# Patient Record
Sex: Female | Born: 1950 | Race: White | Hispanic: No | Smoking: Former smoker
Health system: Southern US, Community
[De-identification: ages and names within clinical notes are randomized; demographics above are authoritative.]

## PROBLEM LIST (undated history)

## (undated) DIAGNOSIS — D649 Anemia, unspecified: Secondary | ICD-10-CM

## (undated) DIAGNOSIS — L309 Dermatitis, unspecified: Secondary | ICD-10-CM

## (undated) DIAGNOSIS — G4730 Sleep apnea, unspecified: Secondary | ICD-10-CM

## (undated) DIAGNOSIS — I219 Acute myocardial infarction, unspecified: Secondary | ICD-10-CM

## (undated) DIAGNOSIS — Z8719 Personal history of other diseases of the digestive system: Secondary | ICD-10-CM

## (undated) DIAGNOSIS — E1142 Type 2 diabetes mellitus with diabetic polyneuropathy: Secondary | ICD-10-CM

## (undated) DIAGNOSIS — I739 Peripheral vascular disease, unspecified: Secondary | ICD-10-CM

## (undated) DIAGNOSIS — R51 Headache: Secondary | ICD-10-CM

## (undated) DIAGNOSIS — K429 Umbilical hernia without obstruction or gangrene: Secondary | ICD-10-CM

## (undated) DIAGNOSIS — N183 Chronic kidney disease, stage 3 unspecified: Secondary | ICD-10-CM

## (undated) DIAGNOSIS — E785 Hyperlipidemia, unspecified: Secondary | ICD-10-CM

## (undated) DIAGNOSIS — M1990 Unspecified osteoarthritis, unspecified site: Secondary | ICD-10-CM

## (undated) DIAGNOSIS — K219 Gastro-esophageal reflux disease without esophagitis: Secondary | ICD-10-CM

## (undated) DIAGNOSIS — E119 Type 2 diabetes mellitus without complications: Secondary | ICD-10-CM

## (undated) DIAGNOSIS — F419 Anxiety disorder, unspecified: Secondary | ICD-10-CM

## (undated) DIAGNOSIS — Z9889 Other specified postprocedural states: Secondary | ICD-10-CM

## (undated) DIAGNOSIS — R112 Nausea with vomiting, unspecified: Secondary | ICD-10-CM

## (undated) DIAGNOSIS — I779 Disorder of arteries and arterioles, unspecified: Secondary | ICD-10-CM

## (undated) DIAGNOSIS — I2510 Atherosclerotic heart disease of native coronary artery without angina pectoris: Secondary | ICD-10-CM

## (undated) DIAGNOSIS — I5042 Chronic combined systolic (congestive) and diastolic (congestive) heart failure: Secondary | ICD-10-CM

## (undated) DIAGNOSIS — I10 Essential (primary) hypertension: Secondary | ICD-10-CM

## (undated) HISTORY — DX: Hyperlipidemia, unspecified: E78.5

## (undated) HISTORY — PX: CARDIAC CATHETERIZATION: SHX172

## (undated) HISTORY — DX: Essential (primary) hypertension: I10

## (undated) HISTORY — DX: Chronic kidney disease, stage 3 unspecified: N18.30

## (undated) HISTORY — DX: Atherosclerotic heart disease of native coronary artery without angina pectoris: I25.10

## (undated) HISTORY — DX: Dermatitis, unspecified: L30.9

## (undated) HISTORY — PX: CARPAL TUNNEL RELEASE: SHX101

## (undated) HISTORY — DX: Chronic kidney disease, stage 3 (moderate): N18.3

## (undated) HISTORY — DX: Gastro-esophageal reflux disease without esophagitis: K21.9

## (undated) HISTORY — DX: Anemia, unspecified: D64.9

## (undated) HISTORY — DX: Type 2 diabetes mellitus with diabetic polyneuropathy: E11.42

## (undated) HISTORY — DX: Disorder of arteries and arterioles, unspecified: I77.9

## (undated) HISTORY — PX: EYE SURGERY: SHX253

## (undated) HISTORY — PX: ABDOMINAL HYSTERECTOMY: SHX81

## (undated) HISTORY — DX: Type 2 diabetes mellitus without complications: E11.9

## (undated) HISTORY — PX: OTHER SURGICAL HISTORY: SHX169

## (undated) HISTORY — DX: Umbilical hernia without obstruction or gangrene: K42.9

## (undated) HISTORY — DX: Chronic combined systolic (congestive) and diastolic (congestive) heart failure: I50.42

## (undated) HISTORY — DX: Peripheral vascular disease, unspecified: I73.9

---

## 2003-08-01 HISTORY — PX: FRACTURE SURGERY: SHX138

## 2006-09-07 ENCOUNTER — Ambulatory Visit: Payer: Self-pay | Admitting: Vascular Surgery

## 2008-09-25 DIAGNOSIS — K429 Umbilical hernia without obstruction or gangrene: Secondary | ICD-10-CM | POA: Insufficient documentation

## 2013-03-20 ENCOUNTER — Encounter: Payer: Self-pay | Admitting: *Deleted

## 2013-03-20 DIAGNOSIS — K219 Gastro-esophageal reflux disease without esophagitis: Secondary | ICD-10-CM | POA: Insufficient documentation

## 2013-03-20 DIAGNOSIS — I999 Unspecified disorder of circulatory system: Secondary | ICD-10-CM | POA: Insufficient documentation

## 2013-03-20 DIAGNOSIS — F419 Anxiety disorder, unspecified: Secondary | ICD-10-CM | POA: Insufficient documentation

## 2013-03-20 DIAGNOSIS — I10 Essential (primary) hypertension: Secondary | ICD-10-CM | POA: Insufficient documentation

## 2013-03-20 DIAGNOSIS — E109 Type 1 diabetes mellitus without complications: Secondary | ICD-10-CM | POA: Insufficient documentation

## 2013-03-20 DIAGNOSIS — I200 Unstable angina: Secondary | ICD-10-CM

## 2013-03-20 DIAGNOSIS — E11319 Type 2 diabetes mellitus with unspecified diabetic retinopathy without macular edema: Secondary | ICD-10-CM | POA: Insufficient documentation

## 2013-03-20 DIAGNOSIS — G4730 Sleep apnea, unspecified: Secondary | ICD-10-CM | POA: Insufficient documentation

## 2013-03-21 ENCOUNTER — Encounter: Payer: Self-pay | Admitting: Cardiovascular Disease

## 2013-03-21 ENCOUNTER — Ambulatory Visit (INDEPENDENT_AMBULATORY_CARE_PROVIDER_SITE_OTHER): Payer: 59 | Admitting: Cardiovascular Disease

## 2013-03-21 VITALS — BP 173/65 | HR 71 | Ht 63.0 in | Wt 193.0 lb

## 2013-03-21 DIAGNOSIS — I2584 Coronary atherosclerosis due to calcified coronary lesion: Secondary | ICD-10-CM | POA: Insufficient documentation

## 2013-03-21 DIAGNOSIS — I10 Essential (primary) hypertension: Secondary | ICD-10-CM

## 2013-03-21 DIAGNOSIS — E785 Hyperlipidemia, unspecified: Secondary | ICD-10-CM | POA: Insufficient documentation

## 2013-03-21 DIAGNOSIS — I2510 Atherosclerotic heart disease of native coronary artery without angina pectoris: Secondary | ICD-10-CM

## 2013-03-21 DIAGNOSIS — I739 Peripheral vascular disease, unspecified: Secondary | ICD-10-CM | POA: Insufficient documentation

## 2013-03-21 NOTE — Patient Instructions (Addendum)
Your physician recommends that you schedule a follow-up appointment in: POST BYPASS SURGERY WITH Dr. Purvis Sheffield   You have been referred to Dr Tyrone Sage, we will call you with the appointment date and time once available

## 2013-03-21 NOTE — Progress Notes (Signed)
Patient ID: Kiara [name], female   DOB: 26-Mar-1951, 62 y.o.   MRN: [id number]    CARDIOLOGY CONSULT NOTE  Patient ID: Kiara [name] MRN: [id number] DOB/AGE: 19-Apr-1951 62 y.o.    HPI:  Kiara [name] is a 62 y.o. Woman who is self-referred today for a second opinion regarding CABG. She presented to another cardiologist with exertional dyspnea and angina. She had been seeing a cardiologist in [place] for 15 years prior to this.  She recently underwent cardiac catheterization at [facility], and while I don't have the actual report available to me at present, the documentation from her visit with Dr. Phylliss Bob (Cardiothoracic surgeon with [facility]) stated the following: "Extensive 3 vessel disease with critical disease affecting her LAD, circumflex and a totally occluded right coronary artery. She has typical diffuse diabetic coronary disease with small distal target vessels that have diffuse atheromatous involvement; however, she does appear to have graftable targets in the right circumflex and LAD distributions". 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 A recent HbA1C was reportedly 9.6%. She was told that she had to get this under better control prior to CABG.  She is on ASA, Plavix, Metoprolol, Imdur, Benazepril, Lasix and Simvastatin. A recent echocardiogram showed an EF of 40-45%, with several wall motion abnormalities (noted below). A prior stress test had revealed "moderate ischemia involving the anterior and anteroseptal walls, with both apical and anterolateral scar.  At present, she gets mild dyspnea on exertion. It may even occur with speaking on the telephone for long periods of time. She's had a couple of episodes of chest pain, and it usually occurs when she's lying down. She's had to sleep with 2 pillows. She denies lightheadedness,  syncope, and palpitations. She has experienced fatigue and leg weakness in the past several weeks as well.  She prefers to have her surgery done at [facility]. She does agree that she needs the surgery.    Review of systems complete and found to be negative unless listed above in HPI  Past Medical History: see HPI  FamHx: her brother died at age 54 of an MI in 10-06-2012.   Family History  Problem Relation Age of Onset  . Heart attack Mother   . Heart disease Mother   . Hypertension Mother   . Heart attack Brother   . Heart disease Brother   . Hypertension Brother     History   Social History  . Marital Status: Married    Spouse Name: N/A    Number of Children: N/A  . Years of Education: N/A   Occupational History  . Not on file.   Social History Main Topics  . Smoking status: Former Games developer  . Smokeless tobacco: Not on file     Comment: STOPPED 2007  . Alcohol Use: No  . Drug Use: Not on file  . Sexual Activity: Not on file   Other Topics Concern  . Not on file   Social History Narrative  . No narrative on file      (Not in a hospital admission)  Physical exam Blood pressure 173/65, pulse 71, height 5\' 3"  (1.6 m), weight 193 lb (87.544 kg). General: NAD Neck: No JVD, no thyromegaly or thyroid nodule.  Lungs: Clear to auscultation bilaterally with normal respiratory effort. CV: Nondisplaced PMI.  Heart regular S1/S2, no S3/S4, no murmur.  No peripheral  edema.  No carotid bruit. Diminished pedal pulses.  Abdomen: Soft, nontender, no hepatosplenomegaly, no distention.  Skin: Intact without lesions or rashes.  Neurologic: Alert and oriented x 3.  Psych: Normal affect. Extremities: No clubbing or cyanosis.  HEENT: Normal.   Labs: reviewed from 01-30-2013, and CBC and BMP within normal limits.      Echo: Summary  1. Overall left ventricular ejection fraction is estimated at 40 to 45%.  2. Mildly to moderately decreased global left ventricular systolic  function.  3. Left ventricular wall motion abnormalities exist. See wall motion findings.  4. Mild mitral annular calcification.  5. Elevated left ventricular end diastolic pressure by tissue Doppler.  6. No intracardiac thrombi, mass or vegetations.     ASSESSMENT AND PLAN:  1. Severe CAD with reduced LV systolic function: given all the available data, with known severe 3-vessel disease, reduced LV systolic function, and diabetes, I am in agreement that CABG is the optimal revascularization option. The patient is in agreement with this as well. I will have her referred to one of our CT surgeons in [facility] for an additional evaluation as well as planning. I do not have the actual cardiac catheterization report or films available to me at the present time, but will work on obtaining these. She is on good medical therapy at present, which includes ASA, Metoprolol, Plavix, Benazepril, Imdur, and Simvastatin. 2. HTN: elevated today, but the patient was anxious and she checks it at home and it is usually well controlled. 3. Hyperlipidemia: will obtain results of her most recent lipid panel. 4. PVD: I will check ABI's when I see her in f/u after CABG.  Signed: Prentice Docker, M.D., F.A.C.C. 03/21/2013, 2:28 PM

## 2013-04-02 ENCOUNTER — Institutional Professional Consult (permissible substitution) (INDEPENDENT_AMBULATORY_CARE_PROVIDER_SITE_OTHER): Payer: 59 | Admitting: Surgery

## 2013-04-02 ENCOUNTER — Other Ambulatory Visit: Payer: Self-pay | Admitting: *Deleted

## 2013-04-02 ENCOUNTER — Encounter: Payer: Self-pay | Admitting: Surgery

## 2013-04-02 VITALS — BP 121/58 | HR 74 | Resp 20 | Ht 63.0 in | Wt 193.0 lb

## 2013-04-02 DIAGNOSIS — I2510 Atherosclerotic heart disease of native coronary artery without angina pectoris: Secondary | ICD-10-CM

## 2013-04-02 NOTE — Progress Notes (Signed)
[address]       [place] [address]             [phone]         PCP is Shellia Carwin, MD Referring Provider is Laqueta Linden, MD  Chief Complaint  Patient presents with  . Coronary Artery Disease    surgical eval for possible CABG, cardiac cath 02/03/13, TEE 01/30/13 @ [facility]    HPI:  Patient is a 62 year old woman with a history of type 1 diabetes for 50 years, hypertension, hyperlipidemia, a family history of heart disease and remote smoking who began having burning SSCP in January 2014 occuring with any exertion. She initially thought this was heart burn but it did not improve. She developed some dyspnea with it and there was some thought that it might be asthma. She was started on bronchodilators without improvement. She says that after her Paxil was increased the chest pain resolved and did not recur. This summer she developed more shortness of breath and fatigue. She had a Lexiscan stress test done which showed apical scar and moderate ischemia involving the anterior and anteroseptal walls with some anterolateral scar in the diagonal distribution. EF was 53% with apical akinesia. 2D echo on 01/30/2013 showed an EF of 40-45% with apical akinesis. The basal and mid anterolateral wall and basal and mid inferolateral wall were moderately hypokinetic. The basal and mid anterior wall and basal and mid inferior septum were mildly hypokinetic. There was no mitral regurgitation. There was no aortic stenosis or aortic insufficiency. She subsequently underwent cardiac catheterization at [facility] on 02/03/2013. This showed severe multivessel coronary disease. The LAD had a hazy 70% plus stenosis in the midportion just after the takeoff of a diagonal branch. The LAD wrapped around the apex. The left circumflex had diffuse irregularity but no significant stenosis. There is a moderate-sized first marginal branch that  had 99% ostial stenosis. There is a distal left posterior lateral branch but no significant disease in it. The right coronary was occluded proximally with filling of the distal vessel by collaterals from the septum. Left ventricular ejection fraction was 40% with anterior apical akinesis and anterior and inferior hypokinesis. There was no gradient across the aortic valve. The patient said that she continues to have exertional fatigue and has not been doing very much activity. She has not had any exertional chest discomfort since last spring.  Past Medical History  Diagnosis Date  . Diabetes mellitus without complication   . Hypertension   . Coronary artery disease   . Diabetic peripheral neuropathy   . Hyperlipidemia   . GERD (gastroesophageal reflux disease)   . Umbilical hernia   . Eczema     Past Surgical History  Procedure Laterality Date  . Amputation of right 4th and 5th toe    . Abdominal hysterectomy    . Carpal tunnel release      Family History  Problem Relation Age of Onset  . Heart attack Mother   . Heart disease Mother   . Hypertension Mother   . Heart attack Brother   . Heart disease Brother   . Hypertension Brother     Social History History  Substance Use Topics  . Smoking status: Former Smoker -- 25 years    Types: Cigarettes    Quit date: 05/30/2010  . Smokeless tobacco: Never Used     Comment: STOPPED 2007  . Alcohol Use: No  Current Outpatient Prescriptions  Medication Sig Dispense Refill  . aspirin EC 81 MG tablet Take 81 mg by mouth daily.      . beclomethasone (QVAR) 40 MCG/ACT inhaler Inhale 2 puffs into the lungs as needed.      . benazepril (LOTENSIN) 20 MG tablet Take 20 mg by mouth daily.      . clonazePAM (KLONOPIN) 1 MG tablet Take 0.5 mg by mouth at bedtime.      . clopidogrel (PLAVIX) 75 MG tablet Take 75 mg by mouth daily.      . fish oil-omega-3 fatty acids 1000 MG capsule Take 1 g by mouth daily.      . fluticasone (FLONASE) 50  MCG/ACT nasal spray Place 2 sprays into the nose daily.      . furosemide (LASIX) 20 MG tablet Take 20 mg by mouth 2 (two) times daily.      Marland Kitchen gabapentin (NEURONTIN) 300 MG capsule Take 300 mg by mouth 3 (three) times daily.      . insulin aspart (NOVOLOG) 100 UNIT/ML injection Inject into the skin 3 (three) times daily with meals.      . isosorbide mononitrate (IMDUR) 30 MG 24 hr tablet Take 30 mg by mouth daily.      Marland Kitchen levocetirizine (XYZAL) 5 MG tablet Take 5 mg by mouth every evening.      . metoprolol tartrate (LOPRESSOR) 25 MG tablet Take 25 mg by mouth 2 (two) times daily.      . nitroGLYCERIN (NITROSTAT) 0.4 MG SL tablet Place 0.4 mg under the tongue every 5 (five) minutes as needed for chest pain.      Marland Kitchen PARoxetine (PAXIL) 20 MG tablet Take 20 mg by mouth every morning.      . polyethylene glycol (MIRALAX / GLYCOLAX) packet Take 17 g by mouth daily.      . ranitidine (ZANTAC) 300 MG capsule Take 300 mg by mouth every evening.      . simvastatin (ZOCOR) 40 MG tablet Take 40 mg by mouth every evening.      Marland Kitchen albuterol (PROVENTIL HFA;VENTOLIN HFA) 108 (90 BASE) MCG/ACT inhaler Inhale 2 puffs into the lungs every 6 (six) hours as needed for wheezing.       No current facility-administered medications for this visit.    Allergies  Allergen Reactions  . Bactrim [Sulfamethoxazole W-Trimethoprim] Rash  . Codeine Nausea And Vomiting  . Sulfamethoxazole-Tmp Ds Rash    Review of Systems  Constitutional: Positive for activity change and fatigue.  HENT: Negative.   Eyes: Negative.   Respiratory: Positive for shortness of breath.   Cardiovascular: Negative for chest pain, palpitations and leg swelling.  Gastrointestinal: Negative.   Genitourinary: Negative.   Musculoskeletal: Negative.   Skin: Negative.   Allergic/Immunologic: Negative.   Neurological: Negative.   Hematological: Negative.   Psychiatric/Behavioral: Negative.     BP 121/58  Pulse 74  Resp 20  Ht 5\' 3"  (1.6 m)  Wt  193 lb (87.544 kg)  BMI 34.2 kg/m2  SpO2 98% Physical Exam  Constitutional: She is oriented to person, place, and time. She appears well-developed and well-nourished. No distress.  HENT:  Head: Normocephalic and atraumatic.  Mouth/Throat: Oropharynx is clear and moist.  Eyes: Conjunctivae and EOM are normal. Pupils are equal, round, and reactive to light.  Neck: Normal range of motion. Neck supple. No JVD present. No thyromegaly present.  Cardiovascular: Normal rate, regular rhythm and normal heart sounds.   No murmur heard. Pulmonary/Chest: Effort normal  and breath sounds normal. No respiratory distress. She has no wheezes. She has no rales.  Abdominal: Soft. Bowel sounds are normal. She exhibits no distension and no mass. There is no tenderness.  Musculoskeletal: She exhibits no edema.  Amputation of right 4th and 5th toes  Lymphadenopathy:    She has no cervical adenopathy.  Neurological: She is alert and oriented to person, place, and time. No cranial nerve deficit.  Skin: Skin is warm and dry.  Psychiatric: She has a normal mood and affect.     Diagnostic Tests:  Cardiac cath films from 02/03/2013 at [facility] were reviewed by me.   Impression/Plan:  She has severe three-vessel coronary disease with diffusely diseased, diabetic- type coronaries. She has a high risk Lexiscan stress test showing anterior and anteroseptal ischemia and I believe the LAD, obtuse marginal, and right coronaries are graftable. Her risk for graft failure is certainly higher due to the diffuse nature of her coronary disease but at the present time she has a large area of myocardium at risk for ischemia and infarction and CABG is really the only option. I discussed the operative procedure with the patient and her son including alternatives, benefits and risks; including but not limited to bleeding, blood transfusion, infection, stroke, myocardial infarction, graft failure, heart block requiring  a permanent pacemaker, organ dysfunction, and death.  [facility] understands and agrees to proceed.  We will schedule surgery for Sept 12, 2014.

## 2013-04-08 ENCOUNTER — Encounter (HOSPITAL_COMMUNITY): Payer: Self-pay | Admitting: Pharmacy Technician

## 2013-04-09 ENCOUNTER — Inpatient Hospital Stay (HOSPITAL_COMMUNITY)
Admission: RE | Admit: 2013-04-09 | Discharge: 2013-04-09 | Disposition: A | Discharge status: Home / Self Care | Payer: Medicare FFS | Source: Ambulatory Visit | Attending: Surgery | Admitting: Surgery

## 2013-04-09 ENCOUNTER — Ambulatory Visit (HOSPITAL_COMMUNITY)
Admission: RE | Admit: 2013-04-09 | Discharge: 2013-04-09 | Disposition: A | Discharge status: Home / Self Care | Payer: Medicare FFS | Source: Ambulatory Visit | Attending: Surgery | Admitting: Surgery

## 2013-04-09 ENCOUNTER — Encounter (HOSPITAL_COMMUNITY): Payer: Self-pay

## 2013-04-09 ENCOUNTER — Encounter (HOSPITAL_COMMUNITY)
Admission: RE | Admit: 2013-04-09 | Discharge: 2013-04-09 | Disposition: A | Discharge status: Home / Self Care | Payer: Medicare FFS | Source: Ambulatory Visit | Attending: Surgery | Admitting: Surgery

## 2013-04-09 VITALS — BP 138/60 | HR 77 | Temp 98.3°F | Resp 20 | Ht 63.0 in | Wt 191.6 lb

## 2013-04-09 DIAGNOSIS — Z01818 Encounter for other preprocedural examination: Secondary | ICD-10-CM | POA: Insufficient documentation

## 2013-04-09 DIAGNOSIS — Z01810 Encounter for preprocedural cardiovascular examination: Secondary | ICD-10-CM

## 2013-04-09 DIAGNOSIS — Z01811 Encounter for preprocedural respiratory examination: Secondary | ICD-10-CM | POA: Insufficient documentation

## 2013-04-09 DIAGNOSIS — I2510 Atherosclerotic heart disease of native coronary artery without angina pectoris: Secondary | ICD-10-CM

## 2013-04-09 HISTORY — DX: Other specified postprocedural states: Z98.890

## 2013-04-09 HISTORY — DX: Headache: R51

## 2013-04-09 HISTORY — DX: Acute myocardial infarction, unspecified: I21.9

## 2013-04-09 HISTORY — DX: Personal history of other diseases of the digestive system: Z87.19

## 2013-04-09 HISTORY — DX: Other specified postprocedural states: R11.2

## 2013-04-09 HISTORY — DX: Unspecified osteoarthritis, unspecified site: M19.90

## 2013-04-09 HISTORY — DX: Anxiety disorder, unspecified: F41.9

## 2013-04-09 HISTORY — DX: Sleep apnea, unspecified: G47.30

## 2013-04-09 LAB — CBC
HCT: 39.7 % (ref 36.0–46.0)
Hemoglobin: 13.7 g/dL (ref 12.0–15.0)
MCH: 31.6 pg (ref 26.0–34.0)
MCHC: 34.5 g/dL (ref 30.0–36.0)
MCV: 91.7 fL (ref 78.0–100.0)
RBC: 4.33 MIL/uL (ref 3.87–5.11)

## 2013-04-09 LAB — URINALYSIS, ROUTINE W REFLEX MICROSCOPIC
Glucose, UA: NEGATIVE mg/dL
Hgb urine dipstick: NEGATIVE
Protein, ur: NEGATIVE mg/dL
pH: 6 (ref 5.0–8.0)

## 2013-04-09 LAB — ABO/RH: ABO/RH(D): A POS

## 2013-04-09 LAB — COMPREHENSIVE METABOLIC PANEL
AST: 23 U/L (ref 0–37)
Albumin: 4 g/dL (ref 3.5–5.2)
Alkaline Phosphatase: 65 U/L (ref 39–117)
BUN: 37 mg/dL — ABNORMAL HIGH (ref 6–23)
CO2: 27 mEq/L (ref 19–32)
Creatinine, Ser: 1.15 mg/dL — ABNORMAL HIGH (ref 0.50–1.10)
Glucose, Bld: 46 mg/dL — ABNORMAL LOW (ref 70–99)
Total Bilirubin: 0.3 mg/dL (ref 0.3–1.2)

## 2013-04-09 LAB — SURGICAL PCR SCREEN: MRSA, PCR: NEGATIVE

## 2013-04-09 LAB — BLOOD GAS, ARTERIAL
Acid-Base Excess: 2.9 mmol/L — ABNORMAL HIGH (ref 0.0–2.0)
Bicarbonate: 27.6 mEq/L — ABNORMAL HIGH (ref 20.0–24.0)
O2 Saturation: 97.9 %
Patient temperature: 98.6
pO2, Arterial: 93.4 mmHg (ref 80.0–100.0)

## 2013-04-09 LAB — URINE MICROSCOPIC-ADD ON

## 2013-04-09 LAB — PULMONARY FUNCTION TEST

## 2013-04-09 MED ORDER — CHLORHEXIDINE GLUCONATE 4 % EX LIQD: 30.0000 mL | CUTANEOUS | Status: DC

## 2013-04-09 MED ORDER — ALBUTEROL SULFATE (5 MG/ML) 0.5% IN NEBU
2.5000 mg | INHALATION_SOLUTION | Freq: Once | RESPIRATORY_TRACT | Status: AC
  Administered 2013-04-09: 2.5 mg via RESPIRATORY_TRACT

## 2013-04-09 NOTE — Progress Notes (Signed)
Instructed patient to keep Insulin pump at basal rate when NPO for surgery per Diabetes Coordinator Edrick Oh RN, and that an Insulin drip would be started morning of surgery. Patient's husband will be with her to get her insulin pump. Requested sleep study from [facility].

## 2013-04-09 NOTE — Progress Notes (Addendum)
VASCULAR LAB PRELIMINARY  PRELIMINARY  PRELIMINARY  PRELIMINARY  Pre-op Cardiac Surgery  Carotid Findings:  Right 1% to 39% ICA stenosis. Left - 40% to 59% ICA stenosis lowest end of range. Bilateral - Vertebral artery flow is antegrade.  Upper Extremity Right Left  Brachial Pressures 173 Triphasic 153 Triphasic  Radial Waveforms Triphasic Triphasic  Ulnar Waveforms Triohasic Triphasic  Palmar Arch (Allen's Test) Normal Normal   Findings:  There is a 20 mm Hg pressure gradient etiology unknown. Doppler waveforms remained normal bilaterally with both radial and ulnar compressions    Lower  Extremity Right Left  Anterior Tibial 194 Biphasic 113 Monophasic  Posterior Tibial 142 Monophasic 153 Biphasic  Ankle/Brachial Indices 1.12 0.94    Findings:  ABIs indicate normal arterial flow bilaterally at rest however abnormal Doppler waveforms may suggest a false elevation of pressures and calcification. Patient has known PVD and DM with right fifth toe amputation in the past.   Sylvio Weatherall, RVS 04/09/2013, 3:04 PM

## 2013-04-09 NOTE — Pre-Procedure Instructions (Signed)
[facility]  04/09/2013   Your procedure is scheduled on:  Friday April 11, 2013  Report to [facility] at 0530 AM.  Call this number if you have problems the morning of surgery: [phone]   Remember:   Do not eat food or drink liquids after midnight Thursday.   Take these medicines the morning of surgery with A SIP OF WATER: Gabapentin, and Metoprolol,    Do not wear jewelry, make-up or nail polish.  Do not wear lotions, powders, or perfumes. You may wear deodorant.  Do not shave 48 hours prior to surgery.  Do not bring valuables to the hospital.  [facility] is not responsible for any belongings or valuables.  Contacts, dentures or bridgework may not be worn into surgery.  Leave suitcase in the car. After surgery it may be brought to your room.  For patients admitted to the hospital, checkout time is 11:00 AM the day of discharge.   Patients discharged the day of surgery will not be allowed to drive home.    Special Instructions: Incentive Spirometry - Practice and bring it with you on the day of surgery. Shower using CHG 2 nights before surgery and the night before surgery.  If you shower the day of surgery use CHG.  Use special wash - you have one bottle of CHG for all showers.  You should use approximately 1/3 of the bottle for each shower.   Please read over the following fact sheets that you were given: Pain Booklet, Coughing and Deep Breathing, Blood Transfusion Information, MRSA Information and Surgical Site Infection Prevention

## 2013-04-09 NOTE — Progress Notes (Signed)
Pt chart left for [place], [place] (anesthesia)  to review abnormal labs ( Glucose 46).

## 2013-04-09 NOTE — Progress Notes (Signed)
Inpatient Diabetes Program Recommendations  AACE/ADA: New Consensus Statement on Inpatient Glycemic Control (2013)  Target Ranges:  Prepandial:   less than 140 mg/dL      Peak postprandial:   less than 180 mg/dL (1-2 hours)      Critically ill patients:  140 - 180 mg/dL     Called by Talbert Forest, RN in pre-admissions unit.  Patient has history of Type 1 diabetes and uses an insulin pump at home to control her CBGs.  Patient is scheduled for CABG on Friday, 09/12.  RN calling the Inpatient DM Program to alert [place] to her surgery and admission.  Patient will be NPO after midnight for 09/12. RN told me they attempted to contact patient's PCP in [place] to ask for advice on what to do with the patient's basal rates pre-operatively.  Patient's PCP is out of town this week.  Advised RN to have patient continue her insulin pump at her normal basal settings before she comes to pre-op on Friday.  If patient's basal settings are appropriate, patient should not have hypoglycemia while she is NPO.  Advised RN to please encourage patient to be vigilant for hypoglycemia.  Also advised RN to tell patient that she will be managed with an IV insulin drip for surgery by the TCTS MD.  Reminded RN that the TCTS surgeon will likely have the patient remove her insulin pump in pre-op and that they will start an IV insulin drip for surgery.  Asked RN to please relay this information to patient and to tell patient that she will be managed with an IV insulin drip for surgery and will need to remove her insulin pump once the IV insulin drip is started.  Advised RN to tell patient to have a family member hold onto her insulin pump and to bring the insulin pump back the hospital on POD#1 b/c the physicians will likely want patient to resume her insulin pump postoperatively.   Will follow closely. Ambrose Finland RN, MSN, CDE Diabetes Coordinator Inpatient Diabetes Program Team Pager: [phone] (8a-10p)

## 2013-04-10 ENCOUNTER — Encounter (HOSPITAL_COMMUNITY): Payer: Self-pay

## 2013-04-10 MED ORDER — POTASSIUM CHLORIDE 2 MEQ/ML IV SOLN
80.0000 meq | INTRAVENOUS | Status: DC
  Filled 2013-04-10: qty 40

## 2013-04-10 MED ORDER — EPINEPHRINE HCL 1 MG/ML IJ SOLN
0.5000 ug/min | INTRAVENOUS | Status: DC
  Filled 2013-04-10: qty 4

## 2013-04-10 MED ORDER — SODIUM CHLORIDE 0.9 % IV SOLN
INTRAVENOUS | Status: AC
  Administered 2013-04-11: 1 [IU]/h via INTRAVENOUS
  Administered 2013-04-11: 8.1 [IU]/h via INTRAVENOUS
  Filled 2013-04-10: qty 1

## 2013-04-10 MED ORDER — DOPAMINE-DEXTROSE 3.2-5 MG/ML-% IV SOLN
2.0000 ug/kg/min | INTRAVENOUS | Status: DC
  Filled 2013-04-10: qty 250

## 2013-04-10 MED ORDER — MAGNESIUM SULFATE 50 % IJ SOLN
40.0000 meq | INTRAMUSCULAR | Status: DC
  Filled 2013-04-10: qty 10

## 2013-04-10 MED ORDER — DEXMEDETOMIDINE HCL IN NACL 400 MCG/100ML IV SOLN
0.1000 ug/kg/h | INTRAVENOUS | Status: AC
  Administered 2013-04-11: 0.2 ug/kg/h via INTRAVENOUS
  Filled 2013-04-10: qty 100

## 2013-04-10 MED ORDER — DEXTROSE 5 % IV SOLN
750.0000 mg | INTRAVENOUS | Status: DC
  Filled 2013-04-10: qty 750

## 2013-04-10 MED ORDER — PLASMA-LYTE 148 IV SOLN
INTRAVENOUS | Status: AC
  Administered 2013-04-11: 08:00:00
  Filled 2013-04-10: qty 2.5

## 2013-04-10 MED ORDER — SODIUM CHLORIDE 0.9 % IV SOLN
INTRAVENOUS | Status: AC
  Administered 2013-04-11: 70 mL via INTRAVENOUS
  Filled 2013-04-10: qty 40

## 2013-04-10 MED ORDER — DEXTROSE 5 % IV SOLN
1.5000 g | INTRAVENOUS | Status: AC
  Administered 2013-04-11: .75 g via INTRAVENOUS
  Filled 2013-04-10 (×2): qty 1.5

## 2013-04-10 MED ORDER — VANCOMYCIN HCL 10 G IV SOLR
1250.0000 mg | INTRAVENOUS | Status: AC
  Administered 2013-04-11: 1250 mg via INTRAVENOUS
  Filled 2013-04-10 (×2): qty 1250

## 2013-04-10 MED ORDER — NITROGLYCERIN IN D5W 200-5 MCG/ML-% IV SOLN
2.0000 ug/min | INTRAVENOUS | Status: AC
  Administered 2013-04-11: 5 ug/min via INTRAVENOUS
  Administered 2013-04-11: 50 ug/min via INTRAVENOUS
  Filled 2013-04-10: qty 250

## 2013-04-10 MED ORDER — SODIUM CHLORIDE 0.9 % IV SOLN
INTRAVENOUS | Status: DC
  Filled 2013-04-10: qty 30

## 2013-04-10 MED ORDER — PHENYLEPHRINE HCL 10 MG/ML IJ SOLN
30.0000 ug/min | INTRAVENOUS | Status: DC
  Filled 2013-04-10: qty 2

## 2013-04-10 NOTE — Progress Notes (Signed)
Anesthesia Chart Review:  Patient is a 62 year old female scheduled for CABG on 04/11/13 by Dr. Laneta Simmers.  History includes CAD, DM1 managed with an insulin pump, HTN, GERD, PVD s/p toe amputations, anxiety, former smoker, post-operative N/V, hiatal hernia. Question of OSA (per Dr. Junius Argyle note)--no sleep study was received from [facility]. PCP is Dr. Majel Homer.  She has been followed by a cardiologist in [place] (Dr. Weyman Pedro), but also saw Dr. Purvis Sheffield with [facility] for a second opinion about CABG. DM Coordinator instructed provided instructions for patient to keep insulin pump on basal rate while NPO.  It will be discontinued once an insulin drip is initiated by her anesthesiologist.    EKG on 04/09/13 showed SR with fusion complexes, possible LAE, low voltage QRS, cannot rule out anterior infarct (age undetermined).  Cardiac cath at [facility] on 02/03/13 showed 3V CAD, EF 40%.  Echo on 01/30/13 showed:  1. Overall left ventricular ejection fraction is estimated at 40 to 45%. 2. Mildly to moderately decreased global left ventricular systolic function. 3. Left ventricular wall motion abnormalities exist. See wall motion findings. 4. Mild mitral annular calcification. 5. Elevated left ventricular end diastolic pressure by tissue Doppler. 6. No intracardiac thrombi, mass or vegetations.   Carotid duplex on 04/09/13 showed: - Right - 1% to 39% ICA stenosis. Left 40% to 59% ICA stenosis lowest end of range. Vertebral artery flow is antegrade bilaterally  CXR on 04/09/13 showed no active cardiopulmonary disease.  Preoperative labs noted.  Glucose was 46 yesterday with mean plasma glucose of 203.  A1C was 8.7.  Cr 1.15.  CBC, PT/PTT WNL.  She will get a fasting CBG on arrival the day of surgery.  Fortunately, she is listed as a first case since she is a type 1 diabetic.  Velna Ochs [facility]/Anesthesiology Phone [phone] 04/10/2013 11:02 AM

## 2013-04-10 NOTE — Progress Notes (Signed)
Call to Med. Records at [facility] they referred the request for sleep study to the Sleep lab, where a voice machine took the message. Left a msg. To fax report to [phone] for Heart surgery that is scheduled for tomorrow a.m.

## 2013-04-11 ENCOUNTER — Encounter (HOSPITAL_COMMUNITY)
Admission: RE | Disposition: A | Discharge status: Home Health | Payer: Medicare FFS | Source: Ambulatory Visit | Attending: Surgery

## 2013-04-11 ENCOUNTER — Encounter (HOSPITAL_COMMUNITY): Payer: Self-pay | Admitting: Anesthesiology

## 2013-04-11 ENCOUNTER — Inpatient Hospital Stay (HOSPITAL_COMMUNITY)
Admission: RE | Admit: 2013-04-11 | Discharge: 2013-04-17 | DRG: 236 | Disposition: A | Discharge status: Home Health | Payer: Medicare FFS | Source: Ambulatory Visit | Attending: Surgery | Admitting: Surgery

## 2013-04-11 ENCOUNTER — Inpatient Hospital Stay (HOSPITAL_COMMUNITY): Payer: Medicare FFS | Admitting: Anesthesiology

## 2013-04-11 ENCOUNTER — Inpatient Hospital Stay (HOSPITAL_COMMUNITY): Payer: Medicare FFS

## 2013-04-11 DIAGNOSIS — I2584 Coronary atherosclerosis due to calcified coronary lesion: Secondary | ICD-10-CM | POA: Diagnosis present

## 2013-04-11 DIAGNOSIS — S98139A Complete traumatic amputation of one unspecified lesser toe, initial encounter: Secondary | ICD-10-CM

## 2013-04-11 DIAGNOSIS — Z7982 Long term (current) use of aspirin: Secondary | ICD-10-CM

## 2013-04-11 DIAGNOSIS — Z9641 Presence of insulin pump (external) (internal): Secondary | ICD-10-CM

## 2013-04-11 DIAGNOSIS — E109 Type 1 diabetes mellitus without complications: Secondary | ICD-10-CM | POA: Diagnosis present

## 2013-04-11 DIAGNOSIS — I2582 Chronic total occlusion of coronary artery: Secondary | ICD-10-CM | POA: Diagnosis present

## 2013-04-11 DIAGNOSIS — J90 Pleural effusion, not elsewhere classified: Secondary | ICD-10-CM | POA: Diagnosis not present

## 2013-04-11 DIAGNOSIS — E1049 Type 1 diabetes mellitus with other diabetic neurological complication: Secondary | ICD-10-CM | POA: Diagnosis present

## 2013-04-11 DIAGNOSIS — E1039 Type 1 diabetes mellitus with other diabetic ophthalmic complication: Secondary | ICD-10-CM | POA: Diagnosis present

## 2013-04-11 DIAGNOSIS — E785 Hyperlipidemia, unspecified: Secondary | ICD-10-CM | POA: Diagnosis present

## 2013-04-11 DIAGNOSIS — F411 Generalized anxiety disorder: Secondary | ICD-10-CM | POA: Diagnosis present

## 2013-04-11 DIAGNOSIS — I2510 Atherosclerotic heart disease of native coronary artery without angina pectoris: Secondary | ICD-10-CM

## 2013-04-11 DIAGNOSIS — Z8249 Family history of ischemic heart disease and other diseases of the circulatory system: Secondary | ICD-10-CM

## 2013-04-11 DIAGNOSIS — Z79899 Other long term (current) drug therapy: Secondary | ICD-10-CM

## 2013-04-11 DIAGNOSIS — I498 Other specified cardiac arrhythmias: Secondary | ICD-10-CM | POA: Diagnosis not present

## 2013-04-11 DIAGNOSIS — E11319 Type 2 diabetes mellitus with unspecified diabetic retinopathy without macular edema: Secondary | ICD-10-CM | POA: Diagnosis present

## 2013-04-11 DIAGNOSIS — Z87891 Personal history of nicotine dependence: Secondary | ICD-10-CM

## 2013-04-11 DIAGNOSIS — Z951 Presence of aortocoronary bypass graft: Secondary | ICD-10-CM

## 2013-04-11 DIAGNOSIS — E1142 Type 2 diabetes mellitus with diabetic polyneuropathy: Secondary | ICD-10-CM | POA: Diagnosis present

## 2013-04-11 DIAGNOSIS — I739 Peripheral vascular disease, unspecified: Secondary | ICD-10-CM | POA: Diagnosis present

## 2013-04-11 DIAGNOSIS — Z794 Long term (current) use of insulin: Secondary | ICD-10-CM

## 2013-04-11 DIAGNOSIS — D62 Acute posthemorrhagic anemia: Secondary | ICD-10-CM | POA: Diagnosis not present

## 2013-04-11 DIAGNOSIS — Z23 Encounter for immunization: Secondary | ICD-10-CM

## 2013-04-11 DIAGNOSIS — K219 Gastro-esophageal reflux disease without esophagitis: Secondary | ICD-10-CM | POA: Diagnosis present

## 2013-04-11 DIAGNOSIS — G4730 Sleep apnea, unspecified: Secondary | ICD-10-CM | POA: Diagnosis present

## 2013-04-11 DIAGNOSIS — E8779 Other fluid overload: Secondary | ICD-10-CM | POA: Diagnosis not present

## 2013-04-11 DIAGNOSIS — I10 Essential (primary) hypertension: Secondary | ICD-10-CM | POA: Diagnosis present

## 2013-04-11 HISTORY — PX: CORONARY ARTERY BYPASS GRAFT: SHX141

## 2013-04-11 LAB — POCT I-STAT 3, ART BLOOD GAS (G3+)
Bicarbonate: 24.8 mEq/L — ABNORMAL HIGH (ref 20.0–24.0)
Bicarbonate: 23 mEq/L (ref 20.0–24.0)
O2 Saturation: 100 %
O2 Saturation: 98 %
O2 Saturation: 99 %
TCO2: 26 mmol/L (ref 0–100)
TCO2: 24 mmol/L (ref 0–100)
pCO2 arterial: 37.9 mmHg (ref 35.0–45.0)
pCO2 arterial: 34.2 mmHg — ABNORMAL LOW (ref 35.0–45.0)
pCO2 arterial: 36.7 mmHg (ref 35.0–45.0)
pH, Arterial: 7.424 (ref 7.350–7.450)
pH, Arterial: 7.457 — ABNORMAL HIGH (ref 7.350–7.450)
pH, Arterial: 7.409 (ref 7.350–7.450)
pO2, Arterial: 296 mmHg — ABNORMAL HIGH (ref 80.0–100.0)
pO2, Arterial: 102 mmHg — ABNORMAL HIGH (ref 80.0–100.0)

## 2013-04-11 LAB — GLUCOSE, CAPILLARY
Glucose-Capillary: 273 mg/dL — ABNORMAL HIGH (ref 70–99)
Glucose-Capillary: 79 mg/dL (ref 70–99)
Glucose-Capillary: 104 mg/dL — ABNORMAL HIGH (ref 70–99)
Glucose-Capillary: 65 mg/dL — ABNORMAL LOW (ref 70–99)
Glucose-Capillary: 169 mg/dL — ABNORMAL HIGH (ref 70–99)
Glucose-Capillary: 150 mg/dL — ABNORMAL HIGH (ref 70–99)

## 2013-04-11 LAB — CBC
HCT: 27.5 % — ABNORMAL LOW (ref 36.0–46.0)
Hemoglobin: 10.5 g/dL — ABNORMAL LOW (ref 12.0–15.0)
Hemoglobin: 9.5 g/dL — ABNORMAL LOW (ref 12.0–15.0)
MCH: 31.6 pg (ref 26.0–34.0)
MCHC: 35.2 g/dL (ref 30.0–36.0)
RBC: 3.09 MIL/uL — ABNORMAL LOW (ref 3.87–5.11)
RDW: 13.4 % (ref 11.5–15.5)
WBC: 9.9 10*3/uL (ref 4.0–10.5)

## 2013-04-11 LAB — HEMOGLOBIN AND HEMATOCRIT, BLOOD: HCT: 22.7 % — ABNORMAL LOW (ref 36.0–46.0)

## 2013-04-11 LAB — POCT I-STAT 4, (NA,K, GLUC, HGB,HCT)
Glucose, Bld: 324 mg/dL — ABNORMAL HIGH (ref 70–99)
Glucose, Bld: 231 mg/dL — ABNORMAL HIGH (ref 70–99)
HCT: 29 % — ABNORMAL LOW (ref 36.0–46.0)
HCT: 30 % — ABNORMAL LOW (ref 36.0–46.0)
HCT: 23 % — ABNORMAL LOW (ref 36.0–46.0)
HCT: 31 % — ABNORMAL LOW (ref 36.0–46.0)
Hemoglobin: 9.9 g/dL — ABNORMAL LOW (ref 12.0–15.0)
Hemoglobin: 9.9 g/dL — ABNORMAL LOW (ref 12.0–15.0)
Hemoglobin: 7.8 g/dL — ABNORMAL LOW (ref 12.0–15.0)
Hemoglobin: 10.5 g/dL — ABNORMAL LOW (ref 12.0–15.0)
Potassium: 4.6 mEq/L (ref 3.5–5.1)
Potassium: 4.6 mEq/L (ref 3.5–5.1)
Potassium: 4.2 mEq/L (ref 3.5–5.1)
Sodium: 139 mEq/L (ref 135–145)
Sodium: 136 mEq/L (ref 135–145)

## 2013-04-11 LAB — POCT I-STAT, CHEM 8
BUN: 24 mg/dL — ABNORMAL HIGH (ref 6–23)
Calcium, Ion: 1.14 mmol/L (ref 1.13–1.30)
Chloride: 109 mEq/L (ref 96–112)
Creatinine, Ser: 1 mg/dL (ref 0.50–1.10)
Sodium: 143 mEq/L (ref 135–145)
TCO2: 23 mmol/L (ref 0–100)

## 2013-04-11 LAB — PROTIME-INR: Prothrombin Time: 16.9 seconds — ABNORMAL HIGH (ref 11.6–15.2)

## 2013-04-11 LAB — PLATELET COUNT: Platelets: 81 10*3/uL — ABNORMAL LOW (ref 150–400)

## 2013-04-11 LAB — CREATININE, SERUM
Creatinine, Ser: 0.99 mg/dL (ref 0.50–1.10)
GFR calc Af Amer: 70 mL/min — ABNORMAL LOW (ref >90)
GFR calc non Af Amer: 61 mL/min — ABNORMAL LOW (ref >90)

## 2013-04-11 LAB — MAGNESIUM: Magnesium: 3 mg/dL — ABNORMAL HIGH (ref 1.5–2.5)

## 2013-04-11 SURGERY — CORONARY ARTERY BYPASS GRAFTING (CABG)
Anesthesia: General | Site: Chest | Wound class: Clean

## 2013-04-11 MED ORDER — METOPROLOL TARTRATE 12.5 MG HALF TABLET: 12.5000 mg | ORAL_TABLET | Freq: Once | ORAL | Status: DC

## 2013-04-11 MED ORDER — ASPIRIN 81 MG PO CHEW: 324.0000 mg | CHEWABLE_TABLET | Freq: Every day | ORAL | Status: DC

## 2013-04-11 MED ORDER — LACTATED RINGERS IV SOLN
INTRAVENOUS | Status: DC | PRN
  Administered 2013-04-11: 07:00:00 via INTRAVENOUS

## 2013-04-11 MED ORDER — PANTOPRAZOLE SODIUM 40 MG PO TBEC
40.0000 mg | DELAYED_RELEASE_TABLET | Freq: Every day | ORAL | Status: DC
  Administered 2013-04-13 – 2013-04-17 (×5): 40 mg via ORAL
  Filled 2013-04-11 (×5): qty 1

## 2013-04-11 MED ORDER — SIMVASTATIN 40 MG PO TABS
40.0000 mg | ORAL_TABLET | Freq: Every evening | ORAL | Status: DC
  Administered 2013-04-12 – 2013-04-16 (×5): 40 mg via ORAL
  Filled 2013-04-11 (×7): qty 1

## 2013-04-11 MED ORDER — DEXTROSE 50 % IV SOLN
INTRAVENOUS | Status: AC
  Filled 2013-04-11: qty 50

## 2013-04-11 MED ORDER — THROMBIN 20000 UNITS EX KIT
PACK | CUTANEOUS | Status: DC | PRN
  Administered 2013-04-11: 20000 [IU] via TOPICAL

## 2013-04-11 MED ORDER — METOPROLOL TARTRATE 25 MG/10 ML ORAL SUSPENSION
12.5000 mg | Freq: Two times a day (BID) | ORAL | Status: DC
  Filled 2013-04-11 (×13): qty 5

## 2013-04-11 MED ORDER — FLUTICASONE PROPIONATE 50 MCG/ACT NA SUSP: 2.0000 | Freq: Every day | NASAL | Status: DC | PRN

## 2013-04-11 MED ORDER — FAMOTIDINE IN NACL 20-0.9 MG/50ML-% IV SOLN
20.0000 mg | Freq: Two times a day (BID) | INTRAVENOUS | Status: AC
  Administered 2013-04-11 (×2): 20 mg via INTRAVENOUS
  Filled 2013-04-11: qty 50

## 2013-04-11 MED ORDER — METOCLOPRAMIDE HCL 5 MG/ML IJ SOLN
10.0000 mg | Freq: Four times a day (QID) | INTRAMUSCULAR | Status: AC
  Administered 2013-04-11 – 2013-04-12 (×4): 10 mg via INTRAVENOUS
  Filled 2013-04-11 (×4): qty 2

## 2013-04-11 MED ORDER — MAGNESIUM SULFATE 40 MG/ML IJ SOLN
4.0000 g | Freq: Once | INTRAMUSCULAR | Status: AC
  Administered 2013-04-11: 4 g via INTRAVENOUS
  Filled 2013-04-11: qty 100

## 2013-04-11 MED ORDER — HEPARIN SODIUM (PORCINE) 1000 UNIT/ML IJ SOLN
INTRAMUSCULAR | Status: DC | PRN
  Administered 2013-04-11: 22000 [IU] via INTRAVENOUS

## 2013-04-11 MED ORDER — ACETAMINOPHEN 160 MG/5ML PO SOLN: 650.0000 mg | Freq: Once | ORAL | Status: AC

## 2013-04-11 MED ORDER — BISACODYL 10 MG RE SUPP: 10.0000 mg | Freq: Every day | RECTAL | Status: DC

## 2013-04-11 MED ORDER — MORPHINE SULFATE 2 MG/ML IJ SOLN
1.0000 mg | INTRAMUSCULAR | Status: AC | PRN
  Administered 2013-04-11 (×3): 4 mg via INTRAVENOUS
  Filled 2013-04-11: qty 1

## 2013-04-11 MED ORDER — MUPIROCIN 2 % EX OINT: 1.0000 "application " | TOPICAL_OINTMENT | Freq: Two times a day (BID) | CUTANEOUS | Status: DC

## 2013-04-11 MED ORDER — ROCURONIUM BROMIDE 100 MG/10ML IV SOLN
INTRAVENOUS | Status: DC | PRN
  Administered 2013-04-11: 50 mg via INTRAVENOUS

## 2013-04-11 MED ORDER — OXYCODONE HCL 5 MG PO TABS
5.0000 mg | ORAL_TABLET | ORAL | Status: DC | PRN
  Administered 2013-04-12: 5 mg via ORAL
  Administered 2013-04-13 – 2013-04-14 (×4): 10 mg via ORAL
  Administered 2013-04-14: 5 mg via ORAL
  Administered 2013-04-15 – 2013-04-17 (×8): 10 mg via ORAL
  Filled 2013-04-11: qty 2
  Filled 2013-04-11: qty 1
  Filled 2013-04-11: qty 2
  Filled 2013-04-11: qty 1
  Filled 2013-04-11 (×10): qty 2

## 2013-04-11 MED ORDER — HEMOSTATIC AGENTS (NO CHARGE) OPTIME
TOPICAL | Status: DC | PRN
  Administered 2013-04-11 (×2): 1 via TOPICAL

## 2013-04-11 MED ORDER — BISACODYL 5 MG PO TBEC
10.0000 mg | DELAYED_RELEASE_TABLET | Freq: Every day | ORAL | Status: DC
  Administered 2013-04-12 – 2013-04-17 (×5): 10 mg via ORAL
  Filled 2013-04-11 (×5): qty 2

## 2013-04-11 MED ORDER — LACTATED RINGERS IV SOLN
INTRAVENOUS | Status: DC
  Administered 2013-04-11: 20 mL/h via INTRAVENOUS
  Administered 2013-04-11: 21:00:00 via INTRAVENOUS

## 2013-04-11 MED ORDER — ACETAMINOPHEN 650 MG RE SUPP
650.0000 mg | Freq: Once | RECTAL | Status: AC
  Administered 2013-04-11: 650 mg via RECTAL

## 2013-04-11 MED ORDER — FENTANYL CITRATE 0.05 MG/ML IJ SOLN
INTRAMUSCULAR | Status: DC | PRN
  Administered 2013-04-11: 250 ug via INTRAVENOUS
  Administered 2013-04-11: 50 ug via INTRAVENOUS
  Administered 2013-04-11: 250 ug via INTRAVENOUS
  Administered 2013-04-11: 200 ug via INTRAVENOUS
  Administered 2013-04-11 (×2): 250 ug via INTRAVENOUS

## 2013-04-11 MED ORDER — ACETAMINOPHEN 160 MG/5ML PO SOLN
1000.0000 mg | Freq: Four times a day (QID) | ORAL | Status: AC
  Administered 2013-04-11 – 2013-04-12 (×2): 1000 mg
  Filled 2013-04-11 (×2): qty 40.6

## 2013-04-11 MED ORDER — CALCIUM CHLORIDE 10 % IV SOLN
INTRAVENOUS | Status: DC | PRN
  Administered 2013-04-11: 2.8 meq via INTRAVENOUS

## 2013-04-11 MED ORDER — PHENYLEPHRINE HCL 10 MG/ML IJ SOLN
0.0000 ug/min | INTRAVENOUS | Status: DC
  Administered 2013-04-11: 20 ug/min via INTRAVENOUS
  Filled 2013-04-11: qty 2

## 2013-04-11 MED ORDER — SODIUM CHLORIDE 0.9 % IV SOLN
250.0000 mL | INTRAVENOUS | Status: DC
  Administered 2013-04-12: 250 mL via INTRAVENOUS

## 2013-04-11 MED ORDER — POTASSIUM CHLORIDE 10 MEQ/50ML IV SOLN
10.0000 meq | INTRAVENOUS | Status: AC
  Administered 2013-04-11 (×3): 10 meq via INTRAVENOUS

## 2013-04-11 MED ORDER — MUPIROCIN 2 % EX OINT
TOPICAL_OINTMENT | Freq: Two times a day (BID) | CUTANEOUS | Status: DC
  Administered 2013-04-11 – 2013-04-13 (×6): via NASAL
  Administered 2013-04-14 (×2): 1 via NASAL
  Administered 2013-04-15 – 2013-04-17 (×5): via NASAL
  Filled 2013-04-11 (×3): qty 22

## 2013-04-11 MED ORDER — LEVOCETIRIZINE DIHYDROCHLORIDE 5 MG PO TABS: 5.0000 mg | ORAL_TABLET | Freq: Every evening | ORAL | Status: DC

## 2013-04-11 MED ORDER — THROMBIN 20000 UNITS EX SOLR
CUTANEOUS | Status: AC
  Filled 2013-04-11: qty 20000

## 2013-04-11 MED ORDER — PAROXETINE HCL 20 MG PO TABS
20.0000 mg | ORAL_TABLET | Freq: Every day | ORAL | Status: DC
  Administered 2013-04-12 – 2013-04-16 (×5): 20 mg via ORAL
  Filled 2013-04-11 (×7): qty 1

## 2013-04-11 MED ORDER — LORATADINE 10 MG PO TABS
10.0000 mg | ORAL_TABLET | Freq: Every evening | ORAL | Status: DC
  Administered 2013-04-12 – 2013-04-16 (×5): 10 mg via ORAL
  Filled 2013-04-11 (×7): qty 1

## 2013-04-11 MED ORDER — VANCOMYCIN HCL IN DEXTROSE 1-5 GM/200ML-% IV SOLN
1000.0000 mg | Freq: Once | INTRAVENOUS | Status: AC
  Administered 2013-04-11: 1000 mg via INTRAVENOUS
  Filled 2013-04-11: qty 200

## 2013-04-11 MED ORDER — GABAPENTIN 300 MG PO CAPS
600.0000 mg | ORAL_CAPSULE | Freq: Three times a day (TID) | ORAL | Status: DC
  Administered 2013-04-12 – 2013-04-17 (×16): 600 mg via ORAL
  Filled 2013-04-11 (×18): qty 2

## 2013-04-11 MED ORDER — DEXTROSE 50 % IV SOLN
25.0000 mL | Freq: Once | INTRAVENOUS | Status: AC | PRN
  Administered 2013-04-11: 15 mL via INTRAVENOUS

## 2013-04-11 MED ORDER — SODIUM CHLORIDE 0.9 % IV SOLN
INTRAVENOUS | Status: DC
  Administered 2013-04-11: 23:00:00 via INTRAVENOUS
  Filled 2013-04-11 (×2): qty 1

## 2013-04-11 MED ORDER — DEXTROSE 5 % IV SOLN
1.5000 g | Freq: Two times a day (BID) | INTRAVENOUS | Status: AC
  Administered 2013-04-11 – 2013-04-13 (×4): 1.5 g via INTRAVENOUS
  Filled 2013-04-11 (×4): qty 1.5

## 2013-04-11 MED ORDER — METOPROLOL TARTRATE 12.5 MG HALF TABLET
12.5000 mg | ORAL_TABLET | Freq: Two times a day (BID) | ORAL | Status: DC
  Administered 2013-04-12 – 2013-04-17 (×9): 12.5 mg via ORAL
  Filled 2013-04-11 (×13): qty 1

## 2013-04-11 MED ORDER — ACETAMINOPHEN 500 MG PO TABS
1000.0000 mg | ORAL_TABLET | Freq: Four times a day (QID) | ORAL | Status: AC
  Administered 2013-04-12 – 2013-04-16 (×17): 1000 mg via ORAL
  Filled 2013-04-11 (×20): qty 2

## 2013-04-11 MED ORDER — NITROGLYCERIN IN D5W 200-5 MCG/ML-% IV SOLN: 0.0000 ug/min | INTRAVENOUS | Status: DC

## 2013-04-11 MED ORDER — PROPOFOL 10 MG/ML IV BOLUS
INTRAVENOUS | Status: DC | PRN
  Administered 2013-04-11: 150 mg via INTRAVENOUS

## 2013-04-11 MED ORDER — SODIUM CHLORIDE 0.9 % IJ SOLN
3.0000 mL | Freq: Two times a day (BID) | INTRAMUSCULAR | Status: DC
  Administered 2013-04-12 – 2013-04-13 (×3): 3 mL via INTRAVENOUS

## 2013-04-11 MED ORDER — MIDAZOLAM HCL 5 MG/5ML IJ SOLN
INTRAMUSCULAR | Status: DC | PRN
  Administered 2013-04-11 (×2): 2 mg via INTRAVENOUS
  Administered 2013-04-11: 4 mg via INTRAVENOUS
  Administered 2013-04-11: 2 mg via INTRAVENOUS

## 2013-04-11 MED ORDER — MIDAZOLAM HCL 2 MG/2ML IJ SOLN: 2.0000 mg | INTRAMUSCULAR | Status: DC | PRN

## 2013-04-11 MED ORDER — INSULIN REGULAR BOLUS VIA INFUSION
0.0000 [IU] | Freq: Three times a day (TID) | INTRAVENOUS | Status: DC
  Filled 2013-04-11: qty 10

## 2013-04-11 MED ORDER — MORPHINE SULFATE 2 MG/ML IJ SOLN
2.0000 mg | INTRAMUSCULAR | Status: DC | PRN
  Administered 2013-04-11: 4 mg via INTRAVENOUS
  Administered 2013-04-12 (×3): 2 mg via INTRAVENOUS
  Administered 2013-04-12: 4 mg via INTRAVENOUS
  Administered 2013-04-13: 2 mg via INTRAVENOUS
  Filled 2013-04-11 (×2): qty 2
  Filled 2013-04-11 (×2): qty 1
  Filled 2013-04-11 (×2): qty 2
  Filled 2013-04-11 (×3): qty 1

## 2013-04-11 MED ORDER — CHLORHEXIDINE GLUCONATE CLOTH 2 % EX PADS
6.0000 | MEDICATED_PAD | Freq: Every day | CUTANEOUS | Status: AC
  Administered 2013-04-11 – 2013-04-15 (×5): 6 via TOPICAL

## 2013-04-11 MED ORDER — FAMOTIDINE 40 MG PO TABS
40.0000 mg | ORAL_TABLET | Freq: Every day | ORAL | Status: DC
  Administered 2013-04-12 – 2013-04-16 (×5): 40 mg via ORAL
  Filled 2013-04-11 (×6): qty 1

## 2013-04-11 MED ORDER — 0.9 % SODIUM CHLORIDE (POUR BTL) OPTIME
TOPICAL | Status: DC | PRN
  Administered 2013-04-11: 1000 mL

## 2013-04-11 MED ORDER — PROTAMINE SULFATE 10 MG/ML IV SOLN
INTRAVENOUS | Status: DC | PRN
  Administered 2013-04-11: 200 mg via INTRAVENOUS

## 2013-04-11 MED ORDER — DEXMEDETOMIDINE HCL IN NACL 200 MCG/50ML IV SOLN
0.1000 ug/kg/h | INTRAVENOUS | Status: DC
  Administered 2013-04-11: 0.4 ug/kg/h via INTRAVENOUS
  Administered 2013-04-12: 0.2 ug/kg/h via INTRAVENOUS
  Filled 2013-04-11 (×2): qty 50

## 2013-04-11 MED ORDER — SODIUM CHLORIDE 0.9 % IV SOLN: INTRAVENOUS | Status: DC

## 2013-04-11 MED ORDER — ASPIRIN EC 325 MG PO TBEC
325.0000 mg | DELAYED_RELEASE_TABLET | Freq: Every day | ORAL | Status: DC
  Administered 2013-04-12 – 2013-04-17 (×6): 325 mg via ORAL
  Filled 2013-04-11 (×6): qty 1

## 2013-04-11 MED ORDER — VECURONIUM BROMIDE 10 MG IV SOLR
INTRAVENOUS | Status: DC | PRN
  Administered 2013-04-11: 5 mg via INTRAVENOUS

## 2013-04-11 MED ORDER — ALBUMIN HUMAN 5 % IV SOLN
250.0000 mL | INTRAVENOUS | Status: AC | PRN
  Administered 2013-04-11 (×4): 250 mL via INTRAVENOUS
  Filled 2013-04-11 (×2): qty 250

## 2013-04-11 MED ORDER — ONDANSETRON HCL 4 MG/2ML IJ SOLN: 4.0000 mg | Freq: Four times a day (QID) | INTRAMUSCULAR | Status: DC | PRN

## 2013-04-11 MED ORDER — SODIUM CHLORIDE 0.45 % IV SOLN
INTRAVENOUS | Status: DC
  Administered 2013-04-11: 20 mL/h via INTRAVENOUS

## 2013-04-11 MED ORDER — METOPROLOL TARTRATE 1 MG/ML IV SOLN: 2.5000 mg | INTRAVENOUS | Status: DC | PRN

## 2013-04-11 MED ORDER — LACTATED RINGERS IV SOLN: 500.0000 mL | Freq: Once | INTRAVENOUS | Status: AC | PRN

## 2013-04-11 MED ORDER — THROMBIN 20000 UNITS EX SOLR
OROMUCOSAL | Status: DC | PRN
  Administered 2013-04-11: 08:00:00 via TOPICAL

## 2013-04-11 MED ORDER — SODIUM CHLORIDE 0.9 % IJ SOLN
3.0000 mL | INTRAMUSCULAR | Status: DC | PRN
  Administered 2013-04-12: 3 mL via INTRAVENOUS

## 2013-04-11 MED ORDER — DOCUSATE SODIUM 100 MG PO CAPS
200.0000 mg | ORAL_CAPSULE | Freq: Every day | ORAL | Status: DC
  Administered 2013-04-12 – 2013-04-17 (×6): 200 mg via ORAL
  Filled 2013-04-11 (×6): qty 2

## 2013-04-11 SURGICAL SUPPLY — 99 items
ATTRACTOMAT 16X20 MAGNETIC DRP (DRAPES) ×3 IMPLANT
BAG DECANTER FOR FLEXI CONT (MISCELLANEOUS) ×3 IMPLANT
BANDAGE ELASTIC 4 VELCRO ST LF (GAUZE/BANDAGES/DRESSINGS) ×3 IMPLANT
BANDAGE ELASTIC 6 VELCRO ST LF (GAUZE/BANDAGES/DRESSINGS) ×3 IMPLANT
BANDAGE GAUZE ELAST BULKY 4 IN (GAUZE/BANDAGES/DRESSINGS) ×3 IMPLANT
BASKET HEART (ORDER IN 25'S) (MISCELLANEOUS) ×1
BASKET HEART (ORDER IN 25S) (MISCELLANEOUS) ×2 IMPLANT
BLADE STERNUM SYSTEM 6 (BLADE) ×3 IMPLANT
CANISTER SUCTION 2500CC (MISCELLANEOUS) ×3 IMPLANT
CANNULA VENOUS LOW PROF 34X46 (CANNULA) ×3 IMPLANT
CATH ROBINSON RED A/P 18FR (CATHETERS) ×6 IMPLANT
CATH THORACIC 28FR (CATHETERS) ×3 IMPLANT
CATH THORACIC 28FR RT ANG (CATHETERS) IMPLANT
CATH THORACIC 36FR (CATHETERS) ×3 IMPLANT
CATH THORACIC 36FR RT ANG (CATHETERS) ×3 IMPLANT
CLIP TI MEDIUM 24 (CLIP) IMPLANT
CLIP TI WIDE RED SMALL 24 (CLIP) IMPLANT
COVER SURGICAL LIGHT HANDLE (MISCELLANEOUS) ×3 IMPLANT
CRADLE DONUT ADULT HEAD (MISCELLANEOUS) ×3 IMPLANT
DRAPE CARDIOVASCULAR INCISE (DRAPES) ×1
DRAPE SLUSH/WARMER DISC (DRAPES) IMPLANT
DRAPE SRG 135X102X78XABS (DRAPES) ×2 IMPLANT
DRSG AQUACEL AG ADV 3.5X14 (GAUZE/BANDAGES/DRESSINGS) ×3 IMPLANT
DRSG COVADERM 4X14 (GAUZE/BANDAGES/DRESSINGS) ×3 IMPLANT
ELECT CAUTERY BLADE 6.4 (BLADE) ×3 IMPLANT
ELECT REM PT RETURN 9FT ADLT (ELECTROSURGICAL) ×6
ELECTRODE REM PT RTRN 9FT ADLT (ELECTROSURGICAL) ×4 IMPLANT
GLOVE BIO SURGEON STRL SZ 6 (GLOVE) ×9 IMPLANT
GLOVE BIO SURGEON STRL SZ 6.5 (GLOVE) ×9 IMPLANT
GLOVE BIO SURGEON STRL SZ7 (GLOVE) IMPLANT
GLOVE BIO SURGEON STRL SZ7.5 (GLOVE) IMPLANT
GLOVE BIOGEL PI IND STRL 6 (GLOVE) ×6 IMPLANT
GLOVE BIOGEL PI IND STRL 6.5 (GLOVE) ×6 IMPLANT
GLOVE BIOGEL PI IND STRL 7.0 (GLOVE) ×4 IMPLANT
GLOVE BIOGEL PI INDICATOR 6 (GLOVE) ×3
GLOVE BIOGEL PI INDICATOR 6.5 (GLOVE) ×3
GLOVE BIOGEL PI INDICATOR 7.0 (GLOVE) ×2
GLOVE EUDERMIC 7 POWDERFREE (GLOVE) ×6 IMPLANT
GLOVE ORTHO TXT STRL SZ7.5 (GLOVE) IMPLANT
GOWN PREVENTION PLUS XLARGE (GOWN DISPOSABLE) ×3 IMPLANT
GOWN STRL NON-REIN LRG LVL3 (GOWN DISPOSABLE) ×24 IMPLANT
HEMOSTAT POWDER SURGIFOAM 1G (HEMOSTASIS) ×9 IMPLANT
HEMOSTAT SURGICEL 2X14 (HEMOSTASIS) ×3 IMPLANT
INSERT FOGARTY 61MM (MISCELLANEOUS) IMPLANT
INSERT FOGARTY XLG (MISCELLANEOUS) IMPLANT
KIT BASIN OR (CUSTOM PROCEDURE TRAY) ×3 IMPLANT
KIT CATH CPB BARTLE (MISCELLANEOUS) ×3 IMPLANT
KIT ROOM TURNOVER OR (KITS) ×3 IMPLANT
KIT SUCTION CATH 14FR (SUCTIONS) ×3 IMPLANT
KIT VASOVIEW W/TROCAR VH 2000 (KITS) ×3 IMPLANT
NS IRRIG 1000ML POUR BTL (IV SOLUTION) ×18 IMPLANT
PACK OPEN HEART (CUSTOM PROCEDURE TRAY) ×3 IMPLANT
PAD ARMBOARD 7.5X6 YLW CONV (MISCELLANEOUS) ×6 IMPLANT
PAD ELECT DEFIB RADIOL ZOLL (MISCELLANEOUS) ×3 IMPLANT
PENCIL BUTTON HOLSTER BLD 10FT (ELECTRODE) ×3 IMPLANT
PUNCH AORTIC ROTATE 4.0MM (MISCELLANEOUS) IMPLANT
PUNCH AORTIC ROTATE 4.5MM 8IN (MISCELLANEOUS) ×3 IMPLANT
PUNCH AORTIC ROTATE 5MM 8IN (MISCELLANEOUS) IMPLANT
SEALANT SURG COSEAL 4ML (VASCULAR PRODUCTS) ×6 IMPLANT
SET CARDIOPLEGIA MPS 5001102 (MISCELLANEOUS) ×3 IMPLANT
SPONGE GAUZE 4X4 12PLY (GAUZE/BANDAGES/DRESSINGS) ×6 IMPLANT
SPONGE INTESTINAL PEANUT (DISPOSABLE) IMPLANT
SPONGE LAP 18X18 X RAY DECT (DISPOSABLE) ×3 IMPLANT
SPONGE LAP 4X18 X RAY DECT (DISPOSABLE) ×3 IMPLANT
SUT BONE WAX W31G (SUTURE) ×3 IMPLANT
SUT MNCRL AB 4-0 PS2 18 (SUTURE) IMPLANT
SUT PROLENE 3 0 SH DA (SUTURE) IMPLANT
SUT PROLENE 3 0 SH1 36 (SUTURE) ×3 IMPLANT
SUT PROLENE 4 0 RB 1 (SUTURE)
SUT PROLENE 4 0 SH DA (SUTURE) IMPLANT
SUT PROLENE 4-0 RB1 .5 CRCL 36 (SUTURE) IMPLANT
SUT PROLENE 5 0 C 1 36 (SUTURE) IMPLANT
SUT PROLENE 6 0 C 1 30 (SUTURE) IMPLANT
SUT PROLENE 7 0 BV 1 (SUTURE) ×3 IMPLANT
SUT PROLENE 7 0 BV1 MDA (SUTURE) ×3 IMPLANT
SUT PROLENE 8 0 BV175 6 (SUTURE) IMPLANT
SUT SILK  1 MH (SUTURE)
SUT SILK 1 MH (SUTURE) IMPLANT
SUT STEEL STERNAL CCS#1 18IN (SUTURE) IMPLANT
SUT STEEL SZ 6 DBL 3X14 BALL (SUTURE) ×9 IMPLANT
SUT VIC AB 1 CTX 36 (SUTURE) ×3
SUT VIC AB 1 CTX36XBRD ANBCTR (SUTURE) ×6 IMPLANT
SUT VIC AB 2-0 CT1 27 (SUTURE) ×1
SUT VIC AB 2-0 CT1 TAPERPNT 27 (SUTURE) ×2 IMPLANT
SUT VIC AB 2-0 CTX 27 (SUTURE) IMPLANT
SUT VIC AB 3-0 SH 27 (SUTURE)
SUT VIC AB 3-0 SH 27X BRD (SUTURE) IMPLANT
SUT VIC AB 3-0 X1 27 (SUTURE) IMPLANT
SUT VICRYL 4-0 PS2 18IN ABS (SUTURE) ×3 IMPLANT
SUTURE E-PAK OPEN HEART (SUTURE) ×3 IMPLANT
SYSTEM SAHARA CHEST DRAIN ATS (WOUND CARE) ×3 IMPLANT
TAPE CLOTH SURG 4X10 WHT LF (GAUZE/BANDAGES/DRESSINGS) ×3 IMPLANT
TOWEL OR 17X24 6PK STRL BLUE (TOWEL DISPOSABLE) ×6 IMPLANT
TOWEL OR 17X26 10 PK STRL BLUE (TOWEL DISPOSABLE) ×6 IMPLANT
TRAY FOLEY IC TEMP SENS 14FR (CATHETERS) ×3 IMPLANT
TUBE SUCT INTRACARD DLP 20F (MISCELLANEOUS) ×6 IMPLANT
TUBING INSUFFLATION 10FT LAP (TUBING) ×3 IMPLANT
UNDERPAD 30X30 INCONTINENT (UNDERPADS AND DIAPERS) ×3 IMPLANT
WATER STERILE IRR 1000ML POUR (IV SOLUTION) ×6 IMPLANT

## 2013-04-11 NOTE — Transfer of Care (Signed)
Immediate Anesthesia Transfer of Care Note  Patient: Kiara [name]  Procedure(s) Performed: Procedure(s): CORONARY ARTERY BYPASS GRAFTING (CABG) (N/A)  Patient Location: SICU  Anesthesia Type:General  Level of Consciousness: sedated and unresponsive  Airway & Oxygen Therapy: Patient remains intubated per anesthesia plan and Patient placed on Ventilator (see vital sign flow sheet for setting)  Post-op Assessment: Post -op Vital signs reviewed and stable  Post vital signs: Reviewed and stable  Complications: No apparent anesthesia complications

## 2013-04-11 NOTE — Interval H&P Note (Signed)
History and Physical Interval Note:  04/11/2013 7:54 AM  Kiara [name]  has presented today for surgery, with the diagnosis of cad  The various methods of treatment have been discussed with the patient and family. After consideration of risks, benefits and other options for treatment, the patient has consented to  Procedure(s): CORONARY ARTERY BYPASS GRAFTING (CABG) (N/A) INTRAOPERATIVE TRANSESOPHAGEAL ECHOCARDIOGRAM (N/A) as a surgical intervention .  The patient's history has been reviewed, patient examined, no change in status, stable for surgery.  I have reviewed the patient's chart and labs.  Questions were answered to the patient's satisfaction.     Alleen Borne

## 2013-04-11 NOTE — Brief Op Note (Signed)
04/11/2013  11:06 AM  PATIENT:  Kiara [name]  62 y.o. female  PRE-OPERATIVE DIAGNOSIS:  cad  POST-OPERATIVE DIAGNOSIS:  cad  PROCEDURE:  Procedure(s):  CORONARY ARTERY BYPASS GRAFTING x2 -LIMA to LAD -SVG to PDA  ENDOSCOPIC SAPHENOUS VEIN HARVEST RIGHT THIGH TO BELOW THE KNEE  SURGEON:  Surgeon(s) and Role:    * Alleen Borne, MD - Primary  PHYSICIAN ASSISTANT: Lowella Dandy PA-C  ANESTHESIA:   general  BLOOD ADMINISTERED: CELLSAVER  DRAINS: Left Pleural chest tube, Mediastinal chest drains   LOCAL MEDICATIONS USED:  NONE  SPECIMEN:  No Specimen  DISPOSITION OF SPECIMEN:  N/A  COUNTS:  YES  TOURNIQUET:  * No tourniquets in log *  DICTATION: .Dragon Dictation  PLAN OF CARE: Admit to inpatient   PATIENT DISPOSITION:  ICU - intubated and hemodynamically stable.   Delay start of Pharmacological VTE agent (>24hrs) due to surgical blood loss or risk of bleeding: yes

## 2013-04-11 NOTE — Progress Notes (Signed)
No wean attempt per high respiratory rate. RN and RT will continue to monitor.

## 2013-04-11 NOTE — Anesthesia Preprocedure Evaluation (Signed)
Anesthesia Evaluation  Patient identified by MRN, date of birth, ID band Patient awake    Reviewed: Allergy & Precautions, H&P , NPO status , Patient's Chart, lab work & pertinent test results  History of Anesthesia Complications (+) PONV  Airway Mallampati: II  Neck ROM: full    Dental   Pulmonary shortness of breath, sleep apnea , former smoker,          Cardiovascular hypertension, + CAD, + Past MI and + Peripheral Vascular Disease     Neuro/Psych  Headaches, Anxiety  Neuromuscular disease    GI/Hepatic hiatal hernia, GERD-  ,  Endo/Other  diabetes, Type 2obese  Renal/GU      Musculoskeletal  (+) Arthritis -,   Abdominal   Peds  Hematology   Anesthesia Other Findings   Reproductive/Obstetrics                           Anesthesia Physical Anesthesia Plan  ASA: III  Anesthesia Plan: General   Post-op Pain Management:    Induction: Intravenous  Airway Management Planned: Oral ETT  Additional Equipment: Arterial line, CVP, PA Cath, Ultrasound Guidance Line Placement and TEE  Intra-op Plan:   Post-operative Plan: Post-operative intubation/ventilation  Informed Consent: I have reviewed the patients History and Physical, chart, labs and discussed the procedure including the risks, benefits and alternatives for the proposed anesthesia with the patient or authorized representative who has indicated his/her understanding and acceptance.     Plan Discussed with: CRNA, Anesthesiologist and Surgeon  Anesthesia Plan Comments:         Anesthesia Quick Evaluation

## 2013-04-11 NOTE — Op Note (Signed)
CARDIOVASCULAR SURGERY OPERATIVE NOTE  04/11/2013  Surgeon:  Alleen Borne, MD  First Assistant: Lowella Dandy, [facility]   Preoperative Diagnosis:  Severe multi-vessel coronary artery disease   Postoperative Diagnosis:  Same   Procedure:  1. Median Sternotomy 2. Extracorporeal circulation 3.   Coronary artery bypass grafting x 2   Left internal mammary graft to the LAD  SVG to PDA  4.   Endoscopic vein harvest from the right leg   Anesthesia:  General Endotracheal   Clinical History/Surgical Indication:  Patient is a 62 year old woman with a history of type 1 diabetes for 50 years, hypertension, hyperlipidemia, a family history of heart disease and remote smoking who began having burning SSCP in January 2014 occuring with any exertion. She initially thought this was heart burn but it did not improve. She developed some dyspnea with it and there was some thought that it might be asthma. She was started on bronchodilators without improvement. She says that after her Paxil was increased the chest pain resolved and did not recur. This summer she developed more shortness of breath and fatigue. She had a Lexiscan stress test done which showed apical scar and moderate ischemia involving the anterior and anteroseptal walls with some anterolateral scar in the diagonal distribution. EF was 53% with apical akinesia. 2D echo on 01/30/2013 showed an EF of 40-45% with apical akinesis. The basal and mid anterolateral wall and basal and mid inferolateral wall were moderately hypokinetic. The basal and mid anterior wall and basal and mid inferior septum were mildly hypokinetic. There was no mitral regurgitation. There was no aortic stenosis or aortic insufficiency. She subsequently underwent cardiac catheterization at [facility] on 02/03/2013. This showed severe multivessel coronary disease. The  LAD had a hazy 70% plus stenosis in the midportion just after the takeoff of a diagonal branch. The LAD wrapped around the apex. The left circumflex had diffuse irregularity but no significant stenosis. There is a moderate-sized first marginal branch that had 99% ostial stenosis. There is a distal left posterior lateral branch but no significant disease in it. The right coronary was occluded proximally with filling of the distal vessel by collaterals from the septum. Left ventricular ejection fraction was 40% with anterior apical akinesis and anterior and inferior hypokinesis. There was no gradient across the aortic valve. The patient said that she continues to have exertional fatigue and has not been doing very much activity. She has not had any exertional chest discomfort since last spring. She has severe three-vessel coronary disease with diffusely diseased, diabetic- type coronaries. She has a high risk Lexiscan stress test showing anterior and anteroseptal ischemia and I believe the LAD, obtuse marginal, and right coronaries are graftable. Her risk for graft failure is certainly higher due to the diffuse nature of her coronary disease but at the present time she has a large area of myocardium at risk for ischemia and infarction and CABG is really the only option. I discussed the  operative procedure with the patient and her son including alternatives, benefits and risks; including but not limited to bleeding, blood transfusion, infection, stroke, myocardial infarction, graft failure, heart block requiring a permanent pacemaker, organ dysfunction, and death. [facility] understands and agrees to proceed.     Preparation:  The patient was seen in the preoperative holding area and the correct patient, correct operation were confirmed with the patient after reviewing the medical record and catheterization. The consent was signed by me. Preoperative antibiotics were given. A pulmonary arterial line and  radial arterial line were placed by the anesthesia team. The patient was taken back to the operating room and positioned supine on the operating room table. After being placed under general endotracheal anesthesia by the anesthesia team a foley catheter was placed. The neck, chest, abdomen, and both legs were prepped with betadine soap and solution and draped in the usual sterile manner. A surgical time-out was taken and the correct patient and operative procedure were confirmed with the nursing and anesthesia staff.   Cardiopulmonary Bypass:  A median sternotomy was performed. The pericardium was opened in the midline. Right ventricular function appeared normal. The ascending aorta was of normal size and had no palpable plaque. There were no contraindications to aortic cannulation or cross-clamping. The patient was fully systemically heparinized and the ACT was maintained > 400 sec. The proximal aortic arch was cannulated with a 20 F aortic cannula for arterial inflow. Venous cannulation was performed via the right atrial appendage using a two-staged venous cannula. An antegrade cardioplegia/vent cannula was inserted into the mid-ascending aorta. Aortic occlusion was performed with a single cross-clamp. Systemic cooling to 32 degrees Centigrade and topical cooling of the heart with iced saline were used. Hyperkalemic antegrade cold blood cardioplegia was used to induce diastolic arrest and was then given at about 20 minute intervals throughout the period of arrest to maintain myocardial temperature at or below 10 degrees centigrade. A temperature probe was inserted into the interventricular septum and an insulating pad was placed in the pericardium.   Left internal mammary harvest:  The left side of the sternum was retracted using the Rultract retractor. The left internal mammary artery was harvested as a pedicle graft. All side branches were clipped. It was a medium-sized vessel of good quality with  excellent blood flow. It was ligated distally and divided. It was sprayed with topical papaverine solution to prevent vasospasm.   Endoscopic vein harvest:  The right greater saphenous vein was harvested endoscopically through a 2 cm incision medial to the right knee. It was harvested from the upper thigh to below the knee. It was a medium-sized vein of good quality. The side branches were all ligated with 4-0 silk ties.    Coronary arteries:  The coronary arteries were examined.   LAD: Severe, diffuse disease. One area distally that was soft enough to open for grafting  LCX:  OM1 small, diffusely diseased with calcific plaque and not suitable for grafting. The distal posterolateral was also severely diseased and not suitable for grafting but had no significant stenosis.  RCA:  PDA had severe, diffuse disease but graftable distally.   Grafts:  1. LIMA to the LAD: 1.6 mm. It was sewn end to side using 8-0 prolene continuous suture. 2. SVG to PDA:  1.5 mm. It was sewn end to side using 7-0 prolene continuous suture.  The proximal vein graft anastomosis was performed to the mid-ascending aorta using continuous 6-0 prolene suture. A graft marker was placed  around the proximal anastomoses.   Completion:  The patient was rewarmed to 37 degrees Centigrade. The clamp was removed from the LIMA pedicle and there was rapid warming of the septum and return of ventricular fibrillation. The crossclamp was removed with a time of 55 minutes. There was spontaneous return of sinus rhythm. The distal and proximal anastomoses were checked for hemostasis. The position of the grafts was satisfactory. Two temporary epicardial pacing wires were placed on the right atrium and two on the right ventricle. The patient was weaned from CPB without difficulty on no inotropes. CPB time was 75 minutes. Cardiac output was 4 LPM. Heparin was fully reversed with protamine and the aortic and venous cannulas removed.  Hemostasis was achieved. Mediastinal and left pleural drainage tubes were placed. The sternum was closed with double #6 stainless steel wires. The fascia was closed with continuous # 1 vicryl suture. The subcutaneous tissue was closed with 2-0 vicryl continuous suture. The skin was closed with 3-0 vicryl subcuticular suture. All sponge, needle, and instrument counts were reported correct at the end of the case. Dry sterile dressings were placed over the incisions and around the chest tubes which were connected to pleurevac suction. The patient was then transported to the surgical intensive care unit in critical but stable condition.

## 2013-04-11 NOTE — Progress Notes (Signed)
Pt's C.I has been below 1.8 since arrival to unit despite 3 albumins. Vitals are otherwise stable. MD Bartle made aware- no new orders received  Felipa Emory

## 2013-04-11 NOTE — Progress Notes (Addendum)
Hypoglycemic Event  CBG:65  Treatment: D50 IV 25 mL  Symptoms: None  Follow-up CBG: [id number] CBG Result 104  Possible Reasons for Event: Unknown  Comments/MD notified:prootcol followed via glucostabilizer    Carey Bullocks, Jorge Ny  Remember to initiate Hypoglycemia Order Set & complete

## 2013-04-11 NOTE — H&P (Signed)
[address]       [name] [address]             [phone]        Cardiothoracic Surgery History and Physical  PCP is Shellia Carwin, MD  Referring Provider is Laqueta Linden, MD  Chief Complaint   Patient presents with   .  Coronary Artery Disease     surgical eval for possible CABG, cardiac cath 02/03/13, TEE 01/30/13 @ [facility]   HPI:  Patient is a 62 year old woman with a history of type 1 diabetes for 50 years, hypertension, hyperlipidemia, a family history of heart disease and remote smoking who began having burning SSCP in January 2014 occuring with any exertion. She initially thought this was heart burn but it did not improve. She developed some dyspnea with it and there was some thought that it might be asthma. She was started on bronchodilators without improvement. She says that after her Paxil was increased the chest pain resolved and did not recur. This summer she developed more shortness of breath and fatigue. She had a Lexiscan stress test done which showed apical scar and moderate ischemia involving the anterior and anteroseptal walls with some anterolateral scar in the diagonal distribution. EF was 53% with apical akinesia. 2D echo on 01/30/2013 showed an EF of 40-45% with apical akinesis. The basal and mid anterolateral wall and basal and mid inferolateral wall were moderately hypokinetic. The basal and mid anterior wall and basal and mid inferior septum were mildly hypokinetic. There was no mitral regurgitation. There was no aortic stenosis or aortic insufficiency. She subsequently underwent cardiac catheterization at [facility] on 02/03/2013. This showed severe multivessel coronary disease. The LAD had a hazy 70% plus stenosis in the midportion just after the takeoff of a diagonal branch. The LAD wrapped around the apex. The left circumflex had diffuse irregularity but no significant stenosis. There is  a moderate-sized first marginal branch that had 99% ostial stenosis. There is a distal left posterior lateral branch but no significant disease in it. The right coronary was occluded proximally with filling of the distal vessel by collaterals from the septum. Left ventricular ejection fraction was 40% with anterior apical akinesis and anterior and inferior hypokinesis. There was no gradient across the aortic valve. The patient said that she continues to have exertional fatigue and has not been doing very much activity. She has not had any exertional chest discomfort since last spring.  Past Medical History   Diagnosis  Date   .  Diabetes mellitus without complication    .  Hypertension    .  Coronary artery disease    .  Diabetic peripheral neuropathy    .  Hyperlipidemia    .  GERD (gastroesophageal reflux disease)    .  Umbilical hernia    .  Eczema     Past Surgical History   Procedure  Laterality  Date   .  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  Abdominal hysterectomy     .  Carpal tunnel release      Family History   Problem  Relation  Age of Onset   .  Heart attack  Mother    .  Heart disease  Mother    .  Hypertension  Mother    .  Heart attack  Brother    .  Heart disease  Brother    .  Hypertension  Brother    Social History  History   Substance Use Topics   .  Smoking status:  Former Smoker -- 25 years     Types:  Cigarettes     Quit date:  05/30/2010   .  Smokeless tobacco:  Never Used      Comment: STOPPED 2007   .  Alcohol Use:  No    Current Outpatient Prescriptions   Medication  Sig  Dispense  Refill   .  aspirin EC 81 MG tablet  Take 81 mg by mouth daily.     .  beclomethasone (QVAR) 40 MCG/ACT inhaler  Inhale 2 puffs into the lungs as needed.     .  benazepril (LOTENSIN) 20 MG tablet  Take 20 mg by mouth daily.     .  clonazePAM (KLONOPIN) 1 MG tablet  Take 0.5 mg by mouth at bedtime.     .  clopidogrel (PLAVIX) 75 MG tablet  Take 75 mg by mouth daily.      .  fish oil-omega-3 fatty acids 1000 MG capsule  Take 1 g by mouth daily.     .  fluticasone (FLONASE) 50 MCG/ACT nasal spray  Place 2 sprays into the nose daily.     .  furosemide (LASIX) 20 MG tablet  Take 20 mg by mouth 2 (two) times daily.     Marland Kitchen  gabapentin (NEURONTIN) 300 MG capsule  Take 300 mg by mouth 3 (three) times daily.     .  insulin aspart (NOVOLOG) 100 UNIT/ML injection  Inject into the skin 3 (three) times daily with meals.     .  isosorbide mononitrate (IMDUR) 30 MG 24 hr tablet  Take 30 mg by mouth daily.     Marland Kitchen  levocetirizine (XYZAL) 5 MG tablet  Take 5 mg by mouth every evening.     .  metoprolol tartrate (LOPRESSOR) 25 MG tablet  Take 25 mg by mouth 2 (two) times daily.     .  nitroGLYCERIN (NITROSTAT) 0.4 MG SL tablet  Place 0.4 mg under the tongue every 5 (five) minutes as needed for chest pain.     Marland Kitchen  PARoxetine (PAXIL) 20 MG tablet  Take 20 mg by mouth every morning.     .  polyethylene glycol (MIRALAX / GLYCOLAX) packet  Take 17 g by mouth daily.     .  ranitidine (ZANTAC) 300 MG capsule  Take 300 mg by mouth every evening.     .  simvastatin (ZOCOR) 40 MG tablet  Take 40 mg by mouth every evening.     Marland Kitchen  albuterol (PROVENTIL HFA;VENTOLIN HFA) 108 (90 BASE) MCG/ACT inhaler  Inhale 2 puffs into the lungs every 6 (six) hours as needed for wheezing.      No current facility-administered medications for this visit.    Allergies   Allergen  Reactions   .  Bactrim [Sulfamethoxazole W-Trimethoprim]  Rash   .  Codeine  Nausea And Vomiting   .  Sulfamethoxazole-Tmp Ds  Rash   Review of Systems  Constitutional: Positive for activity change and fatigue.  HENT: Negative.  Eyes: Negative.  Respiratory: Positive for shortness of breath.  Cardiovascular: Negative for chest pain, palpitations and leg swelling.  Gastrointestinal: Negative.  Genitourinary: Negative.  Musculoskeletal: Negative.  Skin: Negative.  Allergic/Immunologic: Negative.  Neurological: Negative.    Hematological: Negative.  Psychiatric/Behavioral: Negative.  BP 121/58  Pulse 74  Resp 20  Ht 5\' 3"  (1.6 m)  Wt 193 lb (16.109 kg)  BMI 34.2 kg/m2  SpO2 98%  Physical Exam  Constitutional: She is oriented to person, place, and time. She appears well-developed and well-nourished. No distress.  HENT:  Head: Normocephalic and atraumatic.  Mouth/Throat: Oropharynx is clear and moist.  Eyes: Conjunctivae and EOM are normal. Pupils are equal, round, and reactive to light.  Neck: Normal range of motion. Neck supple. No JVD present. No thyromegaly present.  Cardiovascular: Normal rate, regular rhythm and normal heart sounds.  No murmur heard.  Pulmonary/Chest: Effort normal and breath sounds normal. No respiratory distress. She has no wheezes. She has no rales.  Abdominal: Soft. Bowel sounds are normal. She exhibits no distension and no mass. There is no tenderness.  Musculoskeletal: She exhibits no edema.  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  Neurological: She is alert and oriented to person, place, and time. No cranial nerve deficit.  Skin: Skin is warm and dry.  Psychiatric: She has a normal mood and affect.  Diagnostic Tests:  Cardiac cath films from 02/03/2013 at [facility] were reviewed by me.  Impression/Plan:  She has severe three-vessel coronary disease with diffusely diseased, diabetic- type coronaries. She has a high risk Lexiscan stress test showing anterior and anteroseptal ischemia and I believe the LAD, obtuse marginal, and right coronaries are graftable. Her risk for graft failure is certainly higher due to the diffuse nature of her coronary disease but at the present time she has a large area of myocardium at risk for ischemia and infarction and CABG is really the only option. I discussed the operative procedure with the patient and her son including alternatives, benefits and risks; including but not limited to  bleeding, blood transfusion, infection, stroke, myocardial infarction, graft failure, heart block requiring a permanent pacemaker, organ dysfunction, and death. [facility] understands and agrees to proceed. We will schedule surgery for Sept 12, 2014.

## 2013-04-11 NOTE — Progress Notes (Signed)
Attempted to wean per SICU protocol rate 4, 40%.  Pt rr immediately increased to 50's, bp dropped to 66/35.  RN in room and aware, placed pt back on rest mode rate 12, and 50% fio2.

## 2013-04-11 NOTE — Anesthesia Procedure Notes (Signed)
Procedure Name: Intubation Date/Time: 04/11/2013 8:16 AM Performed by: Whitman Hero Pre-anesthesia Checklist: Patient identified, Emergency Drugs available, Suction available, Patient being monitored and Timeout performed Patient Re-evaluated:Patient Re-evaluated prior to inductionOxygen Delivery Method: Circle system utilized Preoxygenation: Pre-oxygenation with 100% oxygen Intubation Type: IV induction Ventilation: Oral airway inserted - appropriate to patient size and Mask ventilation without difficulty Laryngoscope Size: Mac and 3 Grade View: Grade IV Tube size: 8.0 mm Number of attempts: 3 Airway Equipment and Method: Stylet and Video-laryngoscopy Placement Confirmation: ETT inserted through vocal cords under direct vision,  positive ETCO2 and breath sounds checked- equal and bilateral Secured at: 23 cm Tube secured with: Tape Dental Injury: Bloody posterior oropharynx and Teeth and Oropharynx as per pre-operative assessment  Difficulty Due To: Difficulty was unanticipated, Difficult Airway- due to anterior larynx, Difficult Airway- due to reduced neck mobility and Difficult Airway- due to large tongue Future Recommendations: Recommend- induction with short-acting agent, and alternative techniques readily available

## 2013-04-11 NOTE — Progress Notes (Signed)
Patient ID: Kiara [name], female   DOB: [birth year], 62 y.o.   MRN: [id number] EVENING ROUNDS NOTE :     [address]       [place] [address]             [phone]                 Day of Surgery Procedure(s) (LRB): CORONARY ARTERY BYPASS GRAFTING (CABG) (N/A)  Total Length of Stay:  LOS: 0 days  BP 86/46  Pulse 87  Temp(Src) 99 F (37.2 C) (Core (Comment))  Resp 12  Ht 5\' 3"  (1.6 m)  Wt 197 lb 8.5 oz (89.6 kg)  BMI 35 kg/m2  SpO2 100%  .Intake/Output     09/11 0701 - 09/12 0700 09/12 0701 - 09/13 0700   I.V. (mL/kg)  3009.8 (33.6)   Blood  875   IV Piggyback  1300   Total Intake(mL/kg)  5184.8 (57.9)   Urine (mL/kg/hr)  3150 (3.3)   Blood  1475 (1.5)   Chest Tube  250 (0.3)   Total Output   4875   Net   +309.8          . sodium chloride 20 mL/hr (04/11/13 1323)  . sodium chloride    . [START ON 04/12/2013] sodium chloride    . dexmedetomidine Stopped (04/11/13 1636)  . insulin (NOVOLIN-R) infusion Stopped (04/11/13 1500)  . lactated ringers 20 mL/hr (04/11/13 1326)  . nitroGLYCERIN 10 mcg/min (04/11/13 1730)  . phenylephrine (NEO-SYNEPHRINE) Adult infusion Stopped (04/11/13 1645)     Lab Results  Component Value Date   WBC 10.4 04/11/2013   HGB 10.5* 04/11/2013   HCT 31.0* 04/11/2013   PLT 123* 04/11/2013   GLUCOSE 151* 04/11/2013   ALT 17 04/09/2013   AST 23 04/09/2013   NA 142 04/11/2013   K 3.5 04/11/2013   CL 103 04/09/2013   CREATININE 1.15* 04/09/2013   BUN 37* 04/09/2013   CO2 27 04/09/2013   INR 1.41 04/11/2013   HGBA1C 8.7* 04/09/2013   Not bleeding Starting to wake up Weaning vent  Delight Ovens MD  Beeper [phone] Office [phone] 04/11/2013 5:45 PM

## 2013-04-11 NOTE — Progress Notes (Signed)
Pt has been tachypneic in 30-40 since arrival to unit. Upon weaning down of precedex and the patient beginning to wake up, she has been unable to be placed into rapid wean related to respiration rate. Everytime pt has been switched to 40/4 she has an increase in respiration rate to 50's. Pt given pain medicine with no change in rate. MD gerhardt made aware. No new orders, was instructed to given wean more time.  Will monitor.   Kiara [name]

## 2013-04-11 NOTE — Progress Notes (Signed)
MD notified of inability to wean patient due to tachypnea.  ABG results WNL.  Patient comfortable on low dose precedex gtt with pain well controlled.  Per MD will wait to wean patient until respiratory rate is improved.

## 2013-04-12 ENCOUNTER — Inpatient Hospital Stay (HOSPITAL_COMMUNITY): Payer: Medicare FFS

## 2013-04-12 DIAGNOSIS — E119 Type 2 diabetes mellitus without complications: Secondary | ICD-10-CM

## 2013-04-12 LAB — POCT I-STAT 3, ART BLOOD GAS (G3+)
Bicarbonate: 23 mEq/L (ref 20.0–24.0)
O2 Saturation: 98 %
O2 Saturation: 99 %
TCO2: 24 mmol/L (ref 0–100)
TCO2: 24 mmol/L (ref 0–100)
pCO2 arterial: 37.6 mmHg (ref 35.0–45.0)
pCO2 arterial: 38.1 mmHg (ref 35.0–45.0)
pO2, Arterial: 102 mmHg — ABNORMAL HIGH (ref 80.0–100.0)
pO2, Arterial: 150 mmHg — ABNORMAL HIGH (ref 80.0–100.0)

## 2013-04-12 LAB — CBC
HCT: 29.4 % — ABNORMAL LOW (ref 36.0–46.0)
MCV: 93.9 fL (ref 78.0–100.0)
Platelets: 109 10*3/uL — ABNORMAL LOW (ref 150–400)
Platelets: 103 10*3/uL — ABNORMAL LOW (ref 150–400)
RBC: 3.05 MIL/uL — ABNORMAL LOW (ref 3.87–5.11)
RBC: 3.13 MIL/uL — ABNORMAL LOW (ref 3.87–5.11)
RDW: 14.6 % (ref 11.5–15.5)
RDW: 15.5 % (ref 11.5–15.5)
WBC: 10.2 10*3/uL (ref 4.0–10.5)
WBC: 17.1 10*3/uL — ABNORMAL HIGH (ref 4.0–10.5)

## 2013-04-12 LAB — TYPE AND SCREEN
Antibody Screen: NEGATIVE
Unit division: 0
Unit division: 0

## 2013-04-12 LAB — GLUCOSE, CAPILLARY
Glucose-Capillary: 111 mg/dL — ABNORMAL HIGH (ref 70–99)
Glucose-Capillary: 126 mg/dL — ABNORMAL HIGH (ref 70–99)
Glucose-Capillary: 133 mg/dL — ABNORMAL HIGH (ref 70–99)
Glucose-Capillary: 141 mg/dL — ABNORMAL HIGH (ref 70–99)
Glucose-Capillary: 160 mg/dL — ABNORMAL HIGH (ref 70–99)
Glucose-Capillary: 160 mg/dL — ABNORMAL HIGH (ref 70–99)
Glucose-Capillary: 162 mg/dL — ABNORMAL HIGH (ref 70–99)
Glucose-Capillary: 205 mg/dL — ABNORMAL HIGH (ref 70–99)

## 2013-04-12 LAB — BASIC METABOLIC PANEL
CO2: 22 mEq/L (ref 19–32)
Chloride: 109 mEq/L (ref 96–112)
Creatinine, Ser: 1.06 mg/dL (ref 0.50–1.10)
GFR calc Af Amer: 65 mL/min — ABNORMAL LOW (ref >90)
Sodium: 140 mEq/L (ref 135–145)

## 2013-04-12 LAB — CREATININE, SERUM
GFR calc Af Amer: 48 mL/min — ABNORMAL LOW (ref >90)
GFR calc non Af Amer: 41 mL/min — ABNORMAL LOW (ref >90)

## 2013-04-12 LAB — MAGNESIUM
Magnesium: 2.7 mg/dL — ABNORMAL HIGH (ref 1.5–2.5)
Magnesium: 3 mg/dL — ABNORMAL HIGH (ref 1.5–2.5)

## 2013-04-12 LAB — POCT I-STAT, CHEM 8
BUN: 28 mg/dL — ABNORMAL HIGH (ref 6–23)
Creatinine, Ser: 1.4 mg/dL — ABNORMAL HIGH (ref 0.50–1.10)
Glucose, Bld: 177 mg/dL — ABNORMAL HIGH (ref 70–99)
Hemoglobin: 9.9 g/dL — ABNORMAL LOW (ref 12.0–15.0)
Potassium: 4.5 mEq/L (ref 3.5–5.1)

## 2013-04-12 LAB — PREPARE PLATELET PHERESIS: Unit division: 0

## 2013-04-12 MED ORDER — ENOXAPARIN SODIUM 30 MG/0.3ML ~~LOC~~ SOLN
30.0000 mg | SUBCUTANEOUS | Status: DC
  Administered 2013-04-12 – 2013-04-13 (×2): 30 mg via SUBCUTANEOUS
  Filled 2013-04-12 (×2): qty 0.3

## 2013-04-12 MED ORDER — INSULIN ASPART 100 UNIT/ML ~~LOC~~ SOLN: 0.0000 [IU] | SUBCUTANEOUS | Status: DC

## 2013-04-12 MED ORDER — SODIUM CHLORIDE 0.9 % IV SOLN
INTRAVENOUS | Status: DC | PRN
  Filled 2013-04-12: qty 1

## 2013-04-12 MED ORDER — INSULIN REGULAR BOLUS VIA INFUSION
0.0000 [IU] | Freq: Three times a day (TID) | INTRAVENOUS | Status: DC | PRN
  Filled 2013-04-12: qty 10

## 2013-04-12 NOTE — Progress Notes (Signed)
Patient ID: Kiara [name], female   DOB: April 16, 1951, 62 y.o.   MRN: [id number] TCTS DAILY ICU PROGRESS NOTE                   [address]            [place] [address]          [phone]   1 Day Post-Op Procedure(s) (LRB): CORONARY ARTERY BYPASS GRAFTING (CABG) (N/A)  Total Length of Stay:  LOS: 1 day   Subjective: Awake still on vent, follows commands, some incease in rr   Objective: Vital signs in last 24 hours: Temp:  [95.7 F (35.4 C)-100.8 F (38.2 C)] 99.9 F (37.7 C) (09/13 0800) Pulse Rate:  [26-88] 88 (09/13 0800) Cardiac Rhythm:  [-] Normal sinus rhythm (09/13 0800) Resp:  [0-43] 20 (09/13 0800) BP: (77-114)/(42-61) 112/46 mmHg (09/13 0746) SpO2:  [93 %-100 %] 98 % (09/13 0800) FiO2 (%):  [40 %-50 %] 40 % (09/13 0816) Weight:  [197 lb 8.5 oz (89.6 kg)-209 lb 7 oz (95 kg)] 209 lb 7 oz (95 kg) (09/13 0500)  Filed Weights   04/10/13 1300 04/11/13 1315 04/12/13 0500  Weight: 197 lb 8.5 oz (89.6 kg) 197 lb 8.5 oz (89.6 kg) 209 lb 7 oz (95 kg)    Weight change: 0 lb (0 kg)   Hemodynamic parameters for last 24 hours: PAP: (26-44)/(8-21) 34/21 mmHg CO:  [2.9 L/min-4.1 L/min] 4.1 L/min CI:  [1.5 L/min/m2-2.1 L/min/m2] 2.1 L/min/m2  Intake/Output from previous day: 09/12 0701 - 09/13 0700 In: 6098.4 [I.V.:3623.4; Blood:875; IV Piggyback:1600] Out: 5950 [Urine:4025; Blood:1475; Chest Tube:450]  Intake/Output this shift: Total I/O In: 75.5 [I.V.:25.5; IV Piggyback:50] Out: 90 [Urine:40; Chest Tube:50]  Current Meds: Scheduled Meds: . acetaminophen  1,000 mg Oral Q6H   Or  . acetaminophen (TYLENOL) oral liquid 160 mg/5 mL  1,000 mg Per Tube Q6H  . aspirin EC  325 mg Oral Daily   Or  . aspirin  324 mg Per Tube Daily  . bisacodyl  10 mg Oral Daily   Or  . bisacodyl  10 mg Rectal Daily  . cefUROXime (ZINACEF)  IV  1.5 g Intravenous Q12H  . Chlorhexidine Gluconate Cloth  6 each Topical Daily  . docusate sodium  200 mg Oral Daily  .  famotidine  40 mg Oral QHS  . gabapentin  600 mg Oral TID  . insulin regular  0-10 Units Intravenous TID WC  . loratadine  10 mg Oral QPM  . metoprolol tartrate  12.5 mg Oral BID   Or  . metoprolol tartrate  12.5 mg Per Tube BID  . mupirocin ointment   Nasal BID  . [START ON 04/13/2013] pantoprazole  40 mg Oral Daily  . PARoxetine  20 mg Oral QHS  . simvastatin  40 mg Oral QPM  . sodium chloride  3 mL Intravenous Q12H   Continuous Infusions: . sodium chloride 20 mL/hr at 04/11/13 2000  . sodium chloride    . sodium chloride 250 mL (04/12/13 0540)  . dexmedetomidine 0.2 mcg/kg/hr (04/12/13 0203)  . insulin (NOVOLIN-R) infusion 1 mL/hr at 04/12/13 0800  . lactated ringers 20 mL/hr at 04/11/13 2055  . nitroGLYCERIN Stopped (04/12/13 0100)  . phenylephrine (NEO-SYNEPHRINE) Adult infusion Stopped (04/11/13 1645)   PRN Meds:.fluticasone, metoprolol, midazolam, morphine injection, ondansetron (ZOFRAN) IV, oxyCODONE, sodium chloride  General appearance: alert, cooperative and no distress Neurologic: intact Heart: friction rub heard . Lungs: diminished breath sounds bibasilar Abdomen:  soft, non-tender; bowel sounds normal; no masses,  no organomegaly Extremities: extremities normal, atraumatic, no cyanosis or edema and Homans sign is negative, no sign of DVT sternum stable dressing in place   Lab Results: CBC: Recent Labs  04/11/13 1800 04/11/13 1850 04/12/13 0347  WBC 9.9  --  10.2  HGB 9.5* 8.5* 9.4*  HCT 27.5* 25.0* 27.6*  PLT 126*  --  109*   BMET:  Recent Labs  04/09/13 1524  04/11/13 1850 04/12/13 0347  NA 140  < > 143 140  K 4.1  < > 4.7 3.9  CL 103  --  109 109  CO2 27  --   --  22  GLUCOSE 46*  < > 178* 125*  BUN 37*  --  24* 21  CREATININE 1.15*  < > 1.00 1.06  CALCIUM 10.3  --   --  8.5  < > = values in this interval not displayed.  PT/INR:  Recent Labs  04/11/13 1300  LABPROT 16.9*  INR 1.41   Radiology: Dg Chest Portable 1 View In Am  04/12/2013    *RADIOLOGY REPORT*  Clinical Data: Postop  PORTABLE CHEST - 1 VIEW  Comparison: 04/11/2013  Findings: Support devices are in unchanged position.  Heart size and vascular pattern are normal.  Lungs are clear.  IMPRESSION: No acute findings.  Acceptable postoperative appearance.   Original Report Authenticated By: Esperanza Heir, M.D.   Dg Chest Portable 1 View  04/11/2013   *RADIOLOGY REPORT*  Clinical Data: Postop CABG  PORTABLE CHEST - 1 VIEW  Comparison: 04/09/2013  Findings: Endotracheal tube in good position.  NG tube in the stomach.  Pericardial drain in good position.  Left chest tube in place without pneumothorax.  Swan-Ganz catheter in the main pulmonary artery.  The lungs are well aerated without effusion or infiltrate.  IMPRESSION: Satisfactory postop open heart surgery and median sternotomy.  No acute complication.   Original Report Authenticated By: Janeece Riggers, M.D.     Assessment/Plan: S/P Procedure(s) (LRB): CORONARY ARTERY BYPASS GRAFTING (CABG) (N/A) Extubate this am abg pending Mobilize Diuresis d/c tubes/lines Continue foley due to strict I&O, patient in ICU and urinary output monitoring See progression orders Expected Acute  Blood - loss Anemia Continue insulin drip for now , resume insulin pump tomorrow when family brings back Cr stable   Elleah Hemsley B 04/12/2013 8:35 AM

## 2013-04-12 NOTE — Progress Notes (Signed)
Inpatient Diabetes Program Recommendations  AACE/ADA: New Consensus Statement on Inpatient Glycemic Control (2013)  Target Ranges:  Prepandial:   less than 140 mg/dL      Peak postprandial:   less than 180 mg/dL (1-2 hours)      Critically ill patients:  140 - 180 mg/dL    Note: Notified by Morrie Sheldon, RN that patient will be resuming her insulin pump tomorrow and wanted to clarify process per [facility] protocol.  Patient is currently on an insulin drip and Morrie Sheldon, RN stated she would transition from IV to SQ and resume her insulin pump tomorrow.  Called Dr. Tyrone Sage to discuss transition recommendations.  Dr. Tyrone Sage states that he specifically asked that patient be left on IV insulin until she resumed her insulin pump.  Lennox Laity, RN back to let her know that Dr. Tyrone Sage would like to continue IV insulin until insulin pump is resumed.  Morrie Sheldon, RN reports that the patient has her insulin pump and supplies here at the hospital.  Dyanne Carrel, RN to call Dr. Tyrone Sage to let him know that she has pump and supplies here to see if he would like to have patient resume insulin pump now.  Informed Morrie Sheldon, RN of the process and informed her that she would need a doctor's order for the insulin pump order set when transitioned from IV insulin to the insulin pump.  Bedside RN is aware of insulin pump management flowsheet and contract and requirement of Q shift documentation. Patient must be awake, alert, and able to self manage insulin pump and should let RN know when she boluses so that RN can document on the [facility].   Insulin pump settings are as follows:    Basal rates: 12 am - 7 am  1.25 units/h 7am - 2pm  1.75 units/h 2 pm - 7 pm   1.40 units/h 7 pm - 12 am  1.25 units/h    Sensitivity: 1:50 ( 1 unit drops 50 mg/dl) Carb ratio: 1:61 (1 unit covers 15 grams of carbohydrates)   Thanks, Orlando Penner, RN, MSN, CCRN Diabetes Coordinator Inpatient Diabetes Program [phone] (Team Pager) [phone]  (AP office) [phone] [facility] office)

## 2013-04-12 NOTE — Progress Notes (Signed)
Wasted 1/4 bottle of precedex down sink and flushed. Witnessed by [employer] rn  Felipa Emory

## 2013-04-12 NOTE — Procedures (Signed)
Extubation Procedure Note  Patient Details:   Name: Kiara [name] DOB: February 18, 1951 MRN: [id number]   Airway Documentation:    + air leak around cuff, Nif -60, VC 1.3 L  Evaluation  O2 sats: stable throughout Complications: No apparent complications Patient did tolerate procedure well. Bilateral Breath Sounds: Clear Suctioning: Airway Yes, pt able to speak.  No distress noted, no stridor noted.      Jennette Kettle 04/12/2013, 9:02 AM

## 2013-04-12 NOTE — Progress Notes (Signed)
Attempting wean per SICU protocol.  Pt w/ tachypnea RR 32-40, no distress noted, pt denies SOB, when asking pt if she usually breaths fast and shallow, pt nods "yes".  Will continue to wean for now d/t MD on unit rounding.  RN aware, no distress noted, VSS.  Pt appears comfortable.

## 2013-04-12 NOTE — Progress Notes (Signed)
Patient's respiratory rate improved to 18-25, attempted to begin weaning.  After two minutes on 40/4 pt respiratory rate increased to 45.  Will place patient back on full support and defer to AM rounds.

## 2013-04-12 NOTE — Progress Notes (Signed)
RN called me to bedside d/t Dr. Tyrone Sage eval Kiara [name], and wishes to proceed w/ weaning to cpap/ps.  Placed Kiara [name] on 10 ps, 5 peep.  No distress noted, VSS.  Kiara [name] appears comfortable despite being tachypnic.

## 2013-04-13 ENCOUNTER — Inpatient Hospital Stay (HOSPITAL_COMMUNITY): Payer: Medicare FFS

## 2013-04-13 LAB — BASIC METABOLIC PANEL
BUN: 30 mg/dL — ABNORMAL HIGH (ref 6–23)
CO2: 22 mEq/L (ref 19–32)
Calcium: 8.1 mg/dL — ABNORMAL LOW (ref 8.4–10.5)
Chloride: 104 mEq/L (ref 96–112)
Creatinine, Ser: 1.19 mg/dL — ABNORMAL HIGH (ref 0.50–1.10)
GFR calc Af Amer: 56 mL/min — ABNORMAL LOW (ref >90)
GFR calc non Af Amer: 49 mL/min — ABNORMAL LOW (ref >90)
Glucose, Bld: 138 mg/dL — ABNORMAL HIGH (ref 70–99)
Potassium: 4.6 mEq/L (ref 3.5–5.1)
Sodium: 136 mEq/L (ref 135–145)

## 2013-04-13 LAB — GLUCOSE, CAPILLARY
Glucose-Capillary: 125 mg/dL — ABNORMAL HIGH (ref 70–99)
Glucose-Capillary: 121 mg/dL — ABNORMAL HIGH (ref 70–99)
Glucose-Capillary: 106 mg/dL — ABNORMAL HIGH (ref 70–99)
Glucose-Capillary: 131 mg/dL — ABNORMAL HIGH (ref 70–99)
Glucose-Capillary: 120 mg/dL — ABNORMAL HIGH (ref 70–99)
Glucose-Capillary: 116 mg/dL — ABNORMAL HIGH (ref 70–99)
Glucose-Capillary: 146 mg/dL — ABNORMAL HIGH (ref 70–99)
Glucose-Capillary: 193 mg/dL — ABNORMAL HIGH (ref 70–99)
Glucose-Capillary: 221 mg/dL — ABNORMAL HIGH (ref 70–99)
Glucose-Capillary: 425 mg/dL — ABNORMAL HIGH (ref 70–99)

## 2013-04-13 LAB — CBC
HCT: 29.1 % — ABNORMAL LOW (ref 36.0–46.0)
Hemoglobin: 9.5 g/dL — ABNORMAL LOW (ref 12.0–15.0)
MCH: 31 pg (ref 26.0–34.0)
MCHC: 32.6 g/dL (ref 30.0–36.0)
MCV: 95.1 fL (ref 78.0–100.0)
Platelets: 102 10*3/uL — ABNORMAL LOW (ref 150–400)
RBC: 3.06 MIL/uL — ABNORMAL LOW (ref 3.87–5.11)
RDW: 15.4 % (ref 11.5–15.5)
WBC: 16.1 10*3/uL — ABNORMAL HIGH (ref 4.0–10.5)

## 2013-04-13 MED ORDER — INFLUENZA VAC SPLIT QUAD 0.5 ML IM SUSP: 0.5000 mL | INTRAMUSCULAR | Status: DC | PRN

## 2013-04-13 MED ORDER — INSULIN ASPART 100 UNIT/ML ~~LOC~~ SOLN: 0.0000 [IU] | SUBCUTANEOUS | Status: DC

## 2013-04-13 MED ORDER — INSULIN ASPART 100 UNIT/ML ~~LOC~~ SOLN
4.0000 [IU] | Freq: Three times a day (TID) | SUBCUTANEOUS | Status: DC
  Administered 2013-04-14 – 2013-04-15 (×5): 4 [IU] via SUBCUTANEOUS

## 2013-04-13 MED ORDER — INSULIN DETEMIR 100 UNIT/ML ~~LOC~~ SOLN
35.0000 [IU] | Freq: Every day | SUBCUTANEOUS | Status: DC
  Administered 2013-04-13 – 2013-04-16 (×4): 35 [IU] via SUBCUTANEOUS
  Filled 2013-04-13 (×6): qty 0.35

## 2013-04-13 MED ORDER — INSULIN ASPART 100 UNIT/ML ~~LOC~~ SOLN
0.0000 [IU] | SUBCUTANEOUS | Status: DC
  Administered 2013-04-13: 16 [IU] via SUBCUTANEOUS
  Administered 2013-04-13: 20:00:00 via SUBCUTANEOUS
  Administered 2013-04-14: 8 [IU] via SUBCUTANEOUS
  Administered 2013-04-14: 4 [IU] via SUBCUTANEOUS
  Administered 2013-04-14: 2 [IU] via SUBCUTANEOUS
  Administered 2013-04-14 – 2013-04-15 (×2): 4 [IU] via SUBCUTANEOUS
  Administered 2013-04-15: 12 [IU] via SUBCUTANEOUS

## 2013-04-13 MED ORDER — FUROSEMIDE 10 MG/ML IJ SOLN
40.0000 mg | Freq: Once | INTRAMUSCULAR | Status: AC
  Administered 2013-04-13: 40 mg via INTRAVENOUS
  Filled 2013-04-13: qty 4

## 2013-04-13 MED ORDER — INSULIN PUMP
Freq: Three times a day (TID) | SUBCUTANEOUS | Status: DC
  Administered 2013-04-13: 1.8 via SUBCUTANEOUS
  Administered 2013-04-13: 4.1 via SUBCUTANEOUS
  Administered 2013-04-13: 1 via SUBCUTANEOUS
  Filled 2013-04-13: qty 1

## 2013-04-13 MED ORDER — POLYETHYLENE GLYCOL 3350 17 G PO PACK
17.0000 g | PACK | Freq: Every day | ORAL | Status: DC | PRN
  Administered 2013-04-13 – 2013-04-14 (×2): 17 g via ORAL
  Filled 2013-04-13 (×2): qty 1

## 2013-04-13 MED ORDER — ENOXAPARIN SODIUM 40 MG/0.4ML ~~LOC~~ SOLN
40.0000 mg | SUBCUTANEOUS | Status: DC
  Administered 2013-04-14 – 2013-04-16 (×3): 40 mg via SUBCUTANEOUS
  Filled 2013-04-13 (×4): qty 0.4

## 2013-04-13 NOTE — Progress Notes (Signed)
At 1100, pt started using insulin pump from home. Diabetes coordinator made aware of transition. Per [name]'s instruction, IV insulin will be left on for first hour for transition to insulin pump, then will be d/c. Pt signed contract for use of her insulin pump and contract was placed in chart. Pt is alert and oriented and able to verbally describe how to manage pump. Patient was given flow sheet to fill out and educated how to fill it out with every sugar check. Pt aware of need to supply her own supplies to manage pump. Order in [facility] is to use home settings.    Kiara [name]

## 2013-04-13 NOTE — Progress Notes (Signed)
Patient stated feeling constipated, bloated, and had abdominal discomfort (gas pains) 8-9/10,  Gave patient PRN Miralax, encouraged patient to ambulated in hall. Patient ambulated 360 feet with wheelchair on 4 L oxygen and tolerated the walk very well.  Patient did sit on the bedside and voided 45 cc of urine and passed a great amount of gas and felt relief, discomfort was now 3-4/10.  Patient said she felt better.    Will encourage fluids and monitor urine output post foley removal

## 2013-04-13 NOTE — Progress Notes (Addendum)
Per advisement of Diabetes Coordinator MD ordered that patient remove her insulin pump.  son and daughter at bedside who turned pump off and place in back in her carrying case.  Will administer insulin as MD has ordered.

## 2013-04-13 NOTE — Progress Notes (Signed)
Inpatient Diabetes Program Recommendations  AACE/ADA: New Consensus Statement on Inpatient Glycemic Control (2013)  Target Ranges:  Prepandial:   less than 140 mg/dL      Peak postprandial:   less than 180 mg/dL (1-2 hours)      Critically ill patients:  140 - 180 mg/dL   Results for Kiara [name], Kiara [name] (MRN [id number]) as of 04/13/2013 18:33  Ref. Range 04/13/2013 08:04 04/13/2013 09:14 04/13/2013 10:12 04/13/2013 11:22 04/13/2013 12:22 04/13/2013 15:44 04/13/2013 17:08 04/13/2013 18:29  Glucose-Capillary Latest Range: 70-99 mg/dL 409 (H) 811 (H) 914 (H) 182 (H) 178 (H) 335 (H) 364 (H) 358 (H)   Inpatient Diabetes Program Recommendations Insulin - Basal: Please remove insulin pump and order Lantus 35 units Q24H (start now). Correction (SSI): Please remove insulin pump and order Novolog sensitive correction scale Q4H. Insulin - Meal Coverage: Please remove insulin pump and order Novolog 4 units TID with meals for meal coverage.  Note: Patient had CABG surgery on 9/12 and was on IV insulin for glycemic control.  Patient was left on the insulin drip until around 12 noon today at which time she was transitioned back to her insulin pump.  She resumed her insulin pump at 11:00 am and the IV insulin was continued for one hour and then stopped.  Since transitioned to her insulin pump her blood glucose has consistently climbed to over 335 mg/dl.  Morrie Sheldon, RN has kept in communication with diabetes coordinator throughout the day due to concern with blood glucose.  Patient has attempted bolus correction x2 without success of decreasing blood glucose to target range.  When inquiring about patient's normal blood glucose ranges as an outpatient, she states that 300's are normal for her.  Please have patient remove insulin pump and start on SQ insulin.  Recommend Lantus 35 units Q24H (start now), Novolog sensitive correction Q4H, and Novolog 4 units TID with meals for meal coverage.  If blood glucose is not corrected with SQ  insulin may need to place back on IV insulin; will continue to follow and make recommendations as necessry.  Please page with any questions or concerns.  Thanks, Orlando Penner, RN, MSN, CCRN Diabetes Coordinator Inpatient Diabetes Program [phone] (Team Pager) [phone] (AP office) [phone] [facility] office)

## 2013-04-13 NOTE — Progress Notes (Signed)
Patient ID: Kiara [name], female   DOB: 12-14-1950, 62 y.o.   MRN: [id number] EVENING ROUNDS NOTE :     [address]       [place] [address]             [phone]                 2 Days Post-Op Procedure(s) (LRB): CORONARY ARTERY BYPASS GRAFTING (CABG) (N/A)  Total Length of Stay:  LOS: 2 days  BP 126/53  Pulse 80  Temp(Src) 98.5 F (36.9 C) (Oral)  Resp 13  Ht 5\' 3"  (1.6 m)  Wt 207 lb 3.2 oz (93.985 kg)  BMI 36.71 kg/m2  SpO2 98%  .Intake/Output     09/13 0701 - 09/14 0700 09/14 0701 - 09/15 0700   I.V. (mL/kg) 607.7 (6.4) 218.2 (2.3)   Blood     IV Piggyback 100 50   Total Intake(mL/kg) 707.7 (7.4) 268.2 (2.9)   Urine (mL/kg/hr) 770 (0.3) 445 (0.4)   Blood     Chest Tube 100 (0)    Total Output 870 445   Net -162.3 -176.8          . sodium chloride 20 mL/hr at 04/11/13 2000  . sodium chloride    . sodium chloride 250 mL (04/12/13 0540)  . dexmedetomidine 0.2 mcg/kg/hr (04/12/13 0203)  . lactated ringers 20 mL/hr at 04/11/13 2055  . nitroGLYCERIN Stopped (04/12/13 0100)  . phenylephrine (NEO-SYNEPHRINE) Adult infusion 5 mcg/min (04/12/13 1000)     Lab Results  Component Value Date   WBC 16.1* 04/13/2013   HGB 9.5* 04/13/2013   HCT 29.1* 04/13/2013   PLT 102* 04/13/2013   GLUCOSE 138* 04/13/2013   ALT 17 04/09/2013   AST 23 04/09/2013   NA 136 04/13/2013   K 4.6 04/13/2013   CL 104 04/13/2013   CREATININE 1.19* 04/13/2013   BUN 30* 04/13/2013   CO2 22 04/13/2013   INR 1.41 04/11/2013   HGBA1C 8.7* 04/09/2013   With transition to patients pump from insulin drip glucose increased Will d/c pump and use long acting insulin and sliding scale  Delight Ovens MD  Beeper [phone] Office [phone] 04/13/2013 6:54 PM

## 2013-04-13 NOTE — Progress Notes (Signed)
Patient ID: Sherrilee Gilles, female   DOB: 03/15/1951, 62 y.o.   MRN: [id number] TCTS DAILY ICU PROGRESS NOTE                   [address]            [place] [address]          [phone]   2 Days Post-Op Procedure(s) (LRB): CORONARY ARTERY BYPASS GRAFTING (CABG) (N/A)  Total Length of Stay:  LOS: 2 days   Subjective: Awake and alert up in chair, patient alert enough and discussing the settings of her insulin pump  Objective: Vital signs in last 24 hours: Temp:  [98.1 F (36.7 C)-99.9 F (37.7 C)] 98.3 F (36.8 C) (09/14 0740) Pulse Rate:  [78-94] 87 (09/14 0800) Cardiac Rhythm:  [-] Normal sinus rhythm (09/14 0800) Resp:  [0-30] 22 (09/14 0800) BP: (77-146)/(27-105) 97/53 mmHg (09/14 0800) SpO2:  [90 %-100 %] 90 % (09/14 0800) Weight:  [207 lb 3.2 oz (93.985 kg)] 207 lb 3.2 oz (93.985 kg) (09/14 0800)  Filed Weights   04/11/13 1315 04/12/13 0500 04/13/13 0800  Weight: 197 lb 8.5 oz (89.6 kg) 209 lb 7 oz (95 kg) 207 lb 3.2 oz (93.985 kg)    Weight change:    Hemodynamic parameters for last 24 hours: PAP: (33-39)/(21-23) 39/23 mmHg  Intake/Output from previous day: 09/13 0701 - 09/14 0700 In: 707.7 [I.V.:607.7; IV Piggyback:100] Out: 870 [Urine:770; Chest Tube:100]  Intake/Output this shift: Total I/O In: 51.7 [I.V.:1.7; IV Piggyback:50] Out: 10 [Urine:10]  Current Meds: Scheduled Meds: . acetaminophen  1,000 mg Oral Q6H   Or  . acetaminophen (TYLENOL) oral liquid 160 mg/5 mL  1,000 mg Per Tube Q6H  . aspirin EC  325 mg Oral Daily   Or  . aspirin  324 mg Per Tube Daily  . bisacodyl  10 mg Oral Daily   Or  . bisacodyl  10 mg Rectal Daily  . Chlorhexidine Gluconate Cloth  6 each Topical Daily  . docusate sodium  200 mg Oral Daily  . enoxaparin (LOVENOX) injection  30 mg Subcutaneous Q24H  . famotidine  40 mg Oral QHS  . gabapentin  600 mg Oral TID  . insulin regular  0-10 Units Intravenous TID WC  . loratadine  10 mg Oral QPM  .  metoprolol tartrate  12.5 mg Oral BID   Or  . metoprolol tartrate  12.5 mg Per Tube BID  . mupirocin ointment   Nasal BID  . pantoprazole  40 mg Oral Daily  . PARoxetine  20 mg Oral QHS  . simvastatin  40 mg Oral QPM  . sodium chloride  3 mL Intravenous Q12H   Continuous Infusions: . sodium chloride 20 mL/hr at 04/11/13 2000  . sodium chloride    . sodium chloride 250 mL (04/12/13 0540)  . dexmedetomidine 0.2 mcg/kg/hr (04/12/13 0203)  . insulin (NOVOLIN-R) infusion 1 mL/hr at 04/12/13 0800  . insulin (NOVOLIN-R) infusion    . lactated ringers 20 mL/hr at 04/11/13 2055  . nitroGLYCERIN Stopped (04/12/13 0100)  . phenylephrine (NEO-SYNEPHRINE) Adult infusion 5 mcg/min (04/12/13 1000)   PRN Meds:.fluticasone, insulin (NOVOLIN-R) infusion, insulin regular, metoprolol, midazolam, morphine injection, ondansetron (ZOFRAN) IV, oxyCODONE, sodium chloride  General appearance: alert, cooperative and no distress Neurologic: intact Heart: friction rub heard . Lungs: diminished breath sounds bibasilar Abdomen: soft, non-tender; bowel sounds normal; no masses,  no organomegaly Extremities: extremities normal, atraumatic, no cyanosis or edema and Homans sign  is negative, no sign of DVT sternum stable dressing in place   Lab Results: CBC:  Recent Labs  04/12/13 1700 04/12/13 1712 04/13/13 0426  WBC 17.1*  --  16.1*  HGB 9.7* 9.9* 9.5*  HCT 29.4* 29.0* 29.1*  PLT 103*  --  102*   BMET:  Recent Labs  04/12/13 0347  04/12/13 1712 04/13/13 0426  NA 140  --  143 136  K 3.9  --  4.5 4.6  CL 109  --  109 104  CO2 22  --   --  22  GLUCOSE 125*  --  177* 138*  BUN 21  --  28* 30*  CREATININE 1.06  < > 1.40* 1.19*  CALCIUM 8.5  --   --  8.1*  < > = values in this interval not displayed.  PT/INR:   Radiology: Dg Chest Port 1 View  04/13/2013   *RADIOLOGY REPORT*  Clinical Data: Postoperative evaluation, status post chest tube removal  PORTABLE CHEST - 1 VIEW  Comparison: Prior  radiograph from 04/12/2013  Findings: The the patient has been extubated in the interim. Enteric tube is been removed.  Right IJ Cordis sheath remains in place with tip overlying the mid SVC.  Left-sided chest tube has been removed as has the Swan-Ganz catheter.  Cardiomegaly is unchanged.  Median sternotomy wires remain intact.  The lungs are normally inflated.  There is increased bibasilar atelectasis as compared to the prior examination.  No pulmonary edema or definite pleural effusion.  No pneumothorax identified.  Osseous structures are unchanged.  IMPRESSION: Increased bibasilar atelectasis status post chest tube removal and extubation.   Original Report Authenticated By: Rise Mu, M.D.   Dg Chest Portable 1 View In Am  04/12/2013   *RADIOLOGY REPORT*  Clinical Data: Postop  PORTABLE CHEST - 1 VIEW  Comparison: 04/11/2013  Findings: Support devices are in unchanged position.  Heart size and vascular pattern are normal.  Lungs are clear.  IMPRESSION: No acute findings.  Acceptable postoperative appearance.   Original Report Authenticated By: Esperanza Heir, M.D.   Dg Chest Portable 1 View  04/11/2013   *RADIOLOGY REPORT*  Clinical Data: Postop CABG  PORTABLE CHEST - 1 VIEW  Comparison: 04/09/2013  Findings: Endotracheal tube in good position.  NG tube in the stomach.  Pericardial drain in good position.  Left chest tube in place without pneumothorax.  Swan-Ganz catheter in the main pulmonary artery.  The lungs are well aerated without effusion or infiltrate.  IMPRESSION: Satisfactory postop open heart surgery and median sternotomy.  No acute complication.   Original Report Authenticated By: Janeece Riggers, M.D.     Assessment/Plan: S/P Procedure(s) (LRB): CORONARY ARTERY BYPASS GRAFTING (CABG) (N/A) Extubate this am abg pending Mobilize Diuresis d/c a line D/c insulin drip and resume insulin pump Continue foley due to strict I&O, patient in ICU and urinary output monitoring Expected  Acute  Blood - loss Anemia Cr stable  Vinia Jemmott B 04/13/2013 8:41 AM

## 2013-04-13 NOTE — Progress Notes (Signed)
Post dinner CBG (after advance carb coverage and bolus dose) was 358. Hilda Lias, diabetes coordinator, made aware. Recommendation to d/c insulin pump and start sliding scale coverage in addition to lantus. MD Tyrone Sage made aware.   Kiara [name]

## 2013-04-13 NOTE — Progress Notes (Signed)
MD Tyrone Sage made aware of low UOP. New orders received for IV lasix- will monitor.   Felipa Emory

## 2013-04-13 NOTE — Progress Notes (Signed)
Follow up CBG was 363. Hilda Lias, diabetes coordinator, phoned to make aware. Her direction was to have patient bolus herself per her insulin pumps settings (which was 4.1 units). Afterwards, direction was to have patient cover her carbs in advance for dinner time. Retake CBG in one hour and call Hilda Lias back with results. Recommendation will likely be to remove insulin pump and transition to SQ insulin with lantus.   Felipa Emory

## 2013-04-13 NOTE — Progress Notes (Signed)
Pt's first CBG check at 1600 was 335. Per insulin pump protocol, need to send down stat ketones and have pt bolus herself. Per the pt's insulin pump, she was bolused 1 unit. MD Tyrone Sage called to make aware. He stated no need to collect serum ketones at this time. He asked me to page diabetes coordinator for any further recommendations. No further orders received at this time. Will monitor.   Kiara [name]

## 2013-04-14 ENCOUNTER — Inpatient Hospital Stay (HOSPITAL_COMMUNITY): Payer: Medicare FFS

## 2013-04-14 LAB — GLUCOSE, CAPILLARY
Glucose-Capillary: 81 mg/dL (ref 70–99)
Glucose-Capillary: 156 mg/dL — ABNORMAL HIGH (ref 70–99)
Glucose-Capillary: 212 mg/dL — ABNORMAL HIGH (ref 70–99)
Glucose-Capillary: 177 mg/dL — ABNORMAL HIGH (ref 70–99)

## 2013-04-14 LAB — BASIC METABOLIC PANEL
BUN: 40 mg/dL — ABNORMAL HIGH (ref 6–23)
CO2: 27 mEq/L (ref 19–32)
Calcium: 8.2 mg/dL — ABNORMAL LOW (ref 8.4–10.5)
Chloride: 101 mEq/L (ref 96–112)
Creatinine, Ser: 1.21 mg/dL — ABNORMAL HIGH (ref 0.50–1.10)
GFR calc Af Amer: 55 mL/min — ABNORMAL LOW (ref >90)
GFR calc non Af Amer: 48 mL/min — ABNORMAL LOW (ref >90)
Glucose, Bld: 162 mg/dL — ABNORMAL HIGH (ref 70–99)
Potassium: 4.6 mEq/L (ref 3.5–5.1)
Sodium: 135 mEq/L (ref 135–145)

## 2013-04-14 LAB — CBC
HCT: 26.3 % — ABNORMAL LOW (ref 36.0–46.0)
Hemoglobin: 9.1 g/dL — ABNORMAL LOW (ref 12.0–15.0)
MCH: 31.9 pg (ref 26.0–34.0)
MCHC: 34.6 g/dL (ref 30.0–36.0)
MCV: 92.3 fL (ref 78.0–100.0)
Platelets: 165 10*3/uL (ref 150–400)
RBC: 2.85 MIL/uL — ABNORMAL LOW (ref 3.87–5.11)
RDW: 14.6 % (ref 11.5–15.5)
WBC: 14 10*3/uL — ABNORMAL HIGH (ref 4.0–10.5)

## 2013-04-14 MED ORDER — SODIUM CHLORIDE 0.9 % IV SOLN: 250.0000 mL | INTRAVENOUS | Status: DC | PRN

## 2013-04-14 MED ORDER — SODIUM CHLORIDE 0.9 % IJ SOLN
3.0000 mL | Freq: Two times a day (BID) | INTRAMUSCULAR | Status: DC
  Administered 2013-04-14 – 2013-04-16 (×6): 3 mL via INTRAVENOUS

## 2013-04-14 MED ORDER — SODIUM CHLORIDE 0.9 % IJ SOLN: 3.0000 mL | INTRAMUSCULAR | Status: DC | PRN

## 2013-04-14 MED ORDER — MOVING RIGHT ALONG BOOK
Freq: Once | Status: AC
  Administered 2013-04-14: 1
  Filled 2013-04-14: qty 1

## 2013-04-14 MED ORDER — LACTULOSE 10 GM/15ML PO SOLN
20.0000 g | Freq: Every day | ORAL | Status: DC | PRN
  Administered 2013-04-14: 20 g via ORAL
  Filled 2013-04-14: qty 30

## 2013-04-14 MED FILL — Lidocaine HCl IV Inj 20 MG/ML: INTRAVENOUS | Qty: 5 | Status: AC

## 2013-04-14 MED FILL — Electrolyte-R (PH 7.4) Solution: INTRAVENOUS | Qty: 4000 | Status: AC

## 2013-04-14 MED FILL — Sodium Chloride IV Soln 0.9%: INTRAVENOUS | Qty: 1000 | Status: AC

## 2013-04-14 MED FILL — Cefuroxime Sodium For Inj 1.5 GM: INTRAMUSCULAR | Qty: 1.5 | Status: AC

## 2013-04-14 MED FILL — Sodium Bicarbonate IV Soln 8.4%: INTRAVENOUS | Qty: 50 | Status: AC

## 2013-04-14 MED FILL — Magnesium Sulfate Inj 50%: INTRAMUSCULAR | Qty: 10 | Status: AC

## 2013-04-14 MED FILL — Sodium Chloride Irrigation Soln 0.9%: Qty: 1000 | Status: AC

## 2013-04-14 MED FILL — Mannitol IV Soln 20%: INTRAVENOUS | Qty: 500 | Status: AC

## 2013-04-14 MED FILL — Potassium Chloride Inj 2 mEq/ML: INTRAVENOUS | Qty: 40 | Status: AC

## 2013-04-14 MED FILL — Heparin Sodium (Porcine) Inj 1000 Unit/ML: INTRAMUSCULAR | Qty: 30 | Status: AC

## 2013-04-14 MED FILL — Heparin Sodium (Porcine) Inj 1000 Unit/ML: INTRAMUSCULAR | Qty: 10 | Status: AC

## 2013-04-14 NOTE — Anesthesia Postprocedure Evaluation (Signed)
  Anesthesia Post-op Note  Patient: Kiara [name]  Procedure(s) Performed: Procedure(s): CORONARY ARTERY BYPASS GRAFTING (CABG) (N/A)  Patient Location: ICU  Anesthesia Type:General  Level of Consciousness: awake, alert  and oriented  Airway and Oxygen Therapy: Patient Spontanous Breathing  Post-op Pain: mild  Post-op Assessment: Post-op Vital signs reviewed, Patient's Cardiovascular Status Stable, Respiratory Function Stable, Patent Airway, No signs of Nausea or vomiting, Adequate PO intake, Pain level controlled, No headache, No backache, No residual numbness and No residual motor weakness  Post-op Vital Signs: Reviewed and stable  Complications: No apparent anesthesia complications

## 2013-04-14 NOTE — Progress Notes (Signed)
Pt placed herself on CPAP when I was in room. Pt resting comfortably with no distress. Family at bedside. [employer] notified to check machine in AM. No frayed wires machine in tack.

## 2013-04-14 NOTE — Plan of Care (Signed)
Problem: Phase I Progression Outcomes Goal: Point person for discharge identified Outcome: Completed/Met Date Met:  04/14/13 Discharge home.  Mother and other family will be assisting upon discharge.

## 2013-04-14 NOTE — Progress Notes (Addendum)
      [address]       [place] [address]             [phone]      3 Days Post-Op Procedure(s) (LRB): CORONARY ARTERY BYPASS GRAFTING (CABG) (N/A)  Subjective:  Kiara [name] states she is doing better this morning.  States her sugars have been doing better since her insulin pump was removed.  She is ambulating. No BM  Objective: Vital signs in last 24 hours: Temp:  [97.3 F (36.3 C)-98.9 F (37.2 C)] 98.2 F (36.8 C) (09/15 0734) Pulse Rate:  [66-87] 79 (09/15 0700) Cardiac Rhythm:  [-] Atrial paced (09/14 2000) Resp:  [12-25] 12 (09/15 0700) BP: (77-132)/(29-101) 128/54 mmHg (09/15 0700) SpO2:  [88 %-100 %] 98 % (09/15 0700) Weight:  [207 lb 3.2 oz (93.985 kg)-210 lb 5.1 oz (95.4 kg)] 210 lb 5.1 oz (95.4 kg) (09/15 0500)  Intake/Output from previous day: 09/14 0701 - 09/15 0700 In: 508.2 [I.V.:458.2; IV Piggyback:50] Out: 1190 [Urine:1190]  General appearance: alert, cooperative and no distress Heart: regular rate and rhythm Lungs: diminished breath sounds bibasilar Abdomen: soft, non-tender; bowel sounds normal; no masses,  no organomegaly Extremities: edema trace Wound: clean and dry  Lab Results:  Recent Labs  04/13/13 0426 04/14/13 0408  WBC 16.1* 14.0*  HGB 9.5* 9.1*  HCT 29.1* 26.3*  PLT 102* 165   BMET:  Recent Labs  04/13/13 0426 04/14/13 0408  NA 136 135  K 4.6 4.6  CL 104 101  CO2 22 27  GLUCOSE 138* 162*  BUN 30* 40*  CREATININE 1.19* 1.21*  CALCIUM 8.1* 8.2*    PT/INR:  Recent Labs  04/11/13 1300  LABPROT 16.9*  INR 1.41   ABG    Component Value Date/Time   PHART 7.382 04/12/2013 1041   HCO3 22.5 04/12/2013 1041   TCO2 22 04/12/2013 1712   ACIDBASEDEF 2.0 04/12/2013 1041   O2SAT 99.0 04/12/2013 1041   CBG (last 3)   Recent Labs  04/13/13 1926 04/13/13 2324 04/14/13 0322  GLUCAP 425* 324* 175*    Assessment/Plan: S/P Procedure(s) (LRB): CORONARY ARTERY BYPASS GRAFTING (CABG) (N/A)  1. CV- NSR,  brady under pacer, will set to back up at 60- on Lopressor 2. Pulm- wean oxygen as tolerated, continue IS 3. Renal- creatinine at 1.21, volume overloaded received IV Lasix, will order PO Lasix 4. Expected Acute Blood Loss Anemia- Hgb stable at 9.1 5. DM- better control, continue current regimen, will restart insulin pump at discharge 6. Dispo- patient doing well, transfer to 2000   LOS: 3 days    Kiara [name] 04/14/2013   Chart reviewed, patient examined, agree with above.

## 2013-04-14 NOTE — Plan of Care (Signed)
Problem: Phase III Progression Outcomes Goal: Time patient transferred to PCTU/Telemetry POD Outcome: Completed/Met Date Met:  04/14/13 Transferred to 2W27 via wheelchair with propak.  Tolerated transfer well.  Placed on monitor upon arrival.

## 2013-04-14 NOTE — Plan of Care (Signed)
Problem: Phase III Progression Outcomes Goal: Discharge plan remains appropriate-arrangements made Outcome: Completed/Met Date Met:  04/14/13 Home with family upon discharge.  Husband works during the day, plan is for several different family members to check on her throughout the day.

## 2013-04-15 ENCOUNTER — Encounter (HOSPITAL_COMMUNITY): Payer: Self-pay | Admitting: Surgery

## 2013-04-15 DIAGNOSIS — Z951 Presence of aortocoronary bypass graft: Secondary | ICD-10-CM

## 2013-04-15 LAB — CBC
MCH: 30.9 pg (ref 26.0–34.0)
MCHC: 33.1 g/dL (ref 30.0–36.0)
MCV: 93.5 fL (ref 78.0–100.0)
Platelets: 142 10*3/uL — ABNORMAL LOW (ref 150–400)
RBC: 2.91 MIL/uL — ABNORMAL LOW (ref 3.87–5.11)
RDW: 14.7 % (ref 11.5–15.5)

## 2013-04-15 LAB — GLUCOSE, CAPILLARY
Glucose-Capillary: 65 mg/dL — ABNORMAL LOW (ref 70–99)
Glucose-Capillary: 116 mg/dL — ABNORMAL HIGH (ref 70–99)

## 2013-04-15 LAB — BASIC METABOLIC PANEL
CO2: 28 mEq/L (ref 19–32)
Calcium: 8.8 mg/dL (ref 8.4–10.5)
Creatinine, Ser: 1.22 mg/dL — ABNORMAL HIGH (ref 0.50–1.10)
GFR calc non Af Amer: 47 mL/min — ABNORMAL LOW (ref >90)

## 2013-04-15 MED ORDER — INSULIN ASPART 100 UNIT/ML ~~LOC~~ SOLN
0.0000 [IU] | Freq: Three times a day (TID) | SUBCUTANEOUS | Status: DC
  Administered 2013-04-15 – 2013-04-16 (×2): 2 [IU] via SUBCUTANEOUS
  Administered 2013-04-16: 8 [IU] via SUBCUTANEOUS

## 2013-04-15 MED ORDER — POTASSIUM CHLORIDE CRYS ER 20 MEQ PO TBCR
20.0000 meq | EXTENDED_RELEASE_TABLET | Freq: Every day | ORAL | Status: DC
  Administered 2013-04-16 – 2013-04-17 (×2): 20 meq via ORAL
  Filled 2013-04-15 (×2): qty 1

## 2013-04-15 MED ORDER — LEVALBUTEROL HCL 0.63 MG/3ML IN NEBU: 0.6300 mg | INHALATION_SOLUTION | Freq: Four times a day (QID) | RESPIRATORY_TRACT | Status: DC | PRN

## 2013-04-15 MED ORDER — INSULIN ASPART 100 UNIT/ML ~~LOC~~ SOLN: 0.0000 [IU] | Freq: Every day | SUBCUTANEOUS | Status: DC

## 2013-04-15 MED ORDER — FUROSEMIDE 40 MG PO TABS
40.0000 mg | ORAL_TABLET | Freq: Every day | ORAL | Status: DC
  Administered 2013-04-15 – 2013-04-17 (×3): 40 mg via ORAL
  Filled 2013-04-15 (×3): qty 1

## 2013-04-15 MED ORDER — INSULIN ASPART 100 UNIT/ML ~~LOC~~ SOLN
4.0000 [IU] | Freq: Three times a day (TID) | SUBCUTANEOUS | Status: DC
  Administered 2013-04-15 – 2013-04-16 (×3): 4 [IU] via SUBCUTANEOUS

## 2013-04-15 MED ORDER — GUAIFENESIN ER 600 MG PO TB12
600.0000 mg | ORAL_TABLET | Freq: Two times a day (BID) | ORAL | Status: DC
  Administered 2013-04-15 – 2013-04-17 (×4): 600 mg via ORAL
  Filled 2013-04-15 (×6): qty 1

## 2013-04-15 NOTE — Discharge Summary (Signed)
Physician Discharge Summary  Patient ID: Kiara [name] MRN: [id number] DOB/AGE: 1950/08/14 62 y.o.  Admit date: 04/11/2013 Discharge date: 04/15/2013  Admission Diagnoses:  Patient Active Problem List   Diagnosis Date Noted  . Coronary atherosclerosis due to severely calcified coronary lesion 03/21/2013  . Hyperlipidemia 03/21/2013  . PVD (peripheral vascular disease) 03/21/2013  . Unstable chest pain due to insufficient blood supply to heart 03/20/2013  . Decreased circulation 03/20/2013  . Type 1 diabetes 03/20/2013  . Sleep apnea 03/20/2013  . Essential hypertension 03/20/2013  . GERD (gastroesophageal reflux disease) 03/20/2013  . Diabetic retinopathy 03/20/2013  . Anxiety 03/20/2013  . Umbilical hernia 09/25/2008   Discharge Diagnoses:   Patient Active Problem List   Diagnosis Date Noted  . 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  . Coronary atherosclerosis due to severely calcified coronary lesion 03/21/2013  . Hyperlipidemia 03/21/2013  . PVD (peripheral vascular disease) 03/21/2013  . Unstable chest pain due to insufficient blood supply to heart 03/20/2013  . Decreased circulation 03/20/2013  . Type 1 diabetes 03/20/2013  . Sleep apnea 03/20/2013  . Essential hypertension 03/20/2013  . GERD (gastroesophageal reflux disease) 03/20/2013  . Diabetic retinopathy 03/20/2013  . Anxiety 03/20/2013  . Umbilical hernia 09/25/2008   Discharged Condition: good  History of Present Illness:   Ms. Kiara [name] is a 62 year old woman with a history of type 1 diabetes for 50 years, hypertension, hyperlipidemia, a family history of heart disease and remote nicotine use. The patient began having burning substernal chest pain in January 2014 occuring with any exertion. She initially thought this was heart burn but it did not improve. She developed some dyspnea with it and there was some thought that it might be asthma. She was started on bronchodilators without improvement. She says that after her Paxil  was increased the chest pain resolved and did not recur. This summer she developed more shortness of breath and fatigue. She had a Lexiscan stress test done which showed apical scar and moderate ischemia involving the anterior and anteroseptal walls with some anterolateral scar in the diagonal distribution. EF was 53% with apical akinesia. 2D echo on 01/30/2013 showed [name] EF of 40-45% with apical akinesis. The basal and mid anterolateral wall and basal and mid inferolateral wall were moderately hypokinetic. The basal and mid anterior wall and basal and mid inferior septum were mildly hypokinetic. There was no mitral regurgitation. There was no aortic stenosis or aortic insufficiency. She subsequently underwent cardiac catheterization at [facility] on 02/03/2013. This showed severe multivessel coronary disease which was felt to best be treated with Coronary Bypass Grafting.  She presented for surgical evaluation at [facility] on 04/02/2013 at which time it was felt that coronary bypass grafting would be her best treatment option.  There is a concern about a higher risk of graft failure due to diffuse nature of coronary disease.  The risks and benefits of the procedure were explained to the patient and she was agreeable to proceed.  Hospital Course:   The patient presented to [facility] on 04/11/2013.  She was taken to the operating room and underwent CABG x 2 utilizing a SVG to PDA and LIMA to LAD.  She also underwent Endoscopic Saphenous Vein harvest of her right leg.  She tolerated the procedure well and was taken to the SICU in stable condition.  During her stay in the ICU the patient was extubated on POD #1.  The patient had issues with glycemic control.  She was weaned off her insulin  drip and started on her home insulin pump.  This was not adequate for sugar control.  She developed hyperglycemia with glucose levels above 400.  Her pump was discontinued and she was placed on [name] insulin regimen  with good control of glucose levels.  Her chest tubes and arterial lines were removed.  The patient was maintaining Sinus Bradycardia.  She was ambulating with minimal assistance and was transferred to the step down unit.  The patient continues to progress.  She is maintaining NSR with resolution of Bradycardia.  Her pacing wires have been removed without difficulty.  Her blood sugars remain well controlled and we will start her insulin pump at discharge.  She is ambulating with assistance.  She is tolerating a diabetic diet.  Should no further issues arise we anticipate discharge in the next 24-48 hours.  She will follow up with Dr. Laneta Simmers on 05/07/2013 with a CXR prior to her appointment.  The patient will also need to follow up with her PCP or Endocrinologist to ensure proper insulin regimen is being utilized on her insulin pump.  Finally she will need to follow up with her Cardiologist.   Treatments: surgery:   1. Median Sternotomy 2. Extracorporeal circulation       3. Coronary artery bypass grafting x 2             Left internal mammary graft to the LAD             SVG to PDA 4. Endoscopic vein harvest from the right leg  Disposition: Home with Home Health   Discharge Medications:   The patient has been discharged on:   1.Beta Blocker:  Yes [ x  ]                              No   [   ]                              If No, reason:  2.Ace Inhibitor/ARB: Yes [  x ]                                     No  [   ]                                     If No, reason:   3.Statin:   Yes [ x  ]                  No  [   ]                  If No, reason:  4.Ecasa:  Yes  [  x ]                  No   [   ]                  If No, reason:    Medication List    STOP taking these medications       insulin pump 100 unit/ml Soln     isosorbide mononitrate 30 MG 24 hr tablet  Commonly known as:  IMDUR     nitroGLYCERIN  0.4 MG SL tablet  Commonly known as:  NITROSTAT      TAKE these  medications       albuterol 108 (90 BASE) MCG/ACT inhaler  Commonly known as:  PROVENTIL HFA;VENTOLIN HFA  Inhale 2 puffs into the lungs every 4 (four) hours as needed for wheezing or shortness of breath.     aspirin 325 MG EC tablet  Take 1 tablet (325 mg total) by mouth daily.     benazepril 5 MG tablet  Commonly known as:  LOTENSIN  Take 1 tablet (5 mg total) by mouth daily.     clonazePAM 1 MG tablet  Commonly known as:  KLONOPIN  Take 0.5 mg by mouth at bedtime.     fish oil-omega-3 fatty acids 1000 MG capsule  Take 1 g by mouth daily.     fluticasone 50 MCG/ACT nasal spray  Commonly known as:  FLONASE  Place 2 sprays into the nose daily as needed for allergies.     furosemide 40 MG tablet  Commonly known as:  LASIX  Take 1 tablet (40 mg total) by mouth daily.     gabapentin 300 MG capsule  Commonly known as:  NEURONTIN  Take 600 mg by mouth 3 (three) times daily.     insulin aspart 100 UNIT/ML injection  Commonly known as:  novoLOG  Inject 4 Units into the skin 3 (three) times daily with meals.     insulin detemir 100 UNIT/ML injection  Commonly known as:  LEVEMIR  Inject 0.35 mLs (35 Units total) into the skin daily.     levocetirizine 5 MG tablet  Commonly known as:  XYZAL  Take 5 mg by mouth every evening.     metoprolol tartrate 25 MG tablet  Commonly known as:  LOPRESSOR  Take 0.5 tablets (12.5 mg total) by mouth 2 (two) times daily.     oxyCODONE 5 MG immediate release tablet  Commonly known as:  Oxy IR/ROXICODONE  Take 1-2 tablets (5-10 mg total) by mouth every 4 (four) hours as needed.     PARoxetine 20 MG tablet  Commonly known as:  PAXIL  Take 20 mg by mouth at bedtime.     polyethylene glycol packet  Commonly known as:  MIRALAX / GLYCOLAX  Take 17 g by mouth daily.     potassium chloride SA 20 MEQ tablet  Commonly known as:  K-DUR,KLOR-CON  Take 1 tablet (20 mEq total) by mouth daily.     ranitidine 300 MG capsule  Commonly known as:   ZANTAC  Take 300 mg by mouth every evening.     simvastatin 40 MG tablet  Commonly known as:  ZOCOR  Take 40 mg by mouth every evening.            Future Appointments Provider Department Dept Phone   05/07/2013 10:30 AM Alleen Borne, MD [facility] [phone]         Follow-up Information   Follow up with Alleen Borne, MD On 05/07/2013. (Appointment is at 10:30)    Specialty:  Cardiothoracic Surgery   Contact information:   [address] [place] [address] [place] [address] [phone]       Follow up with [facility] On 05/07/2013. (Please get CXR at 9:30)    Contact information:   [place]       Schedule [name] appointment as soon as possible for a visit with Majel Homer, MD. (Please follow up wtih PCP to ensure insulin  pump is at correct rate)    Specialty:  Internal Medicine   Contact information:   [address] [place] [address] [phone]       Signed: Lowella Dandy 04/15/2013, 9:12 AM

## 2013-04-15 NOTE — Progress Notes (Signed)
Inpatient Diabetes Program Recommendations  AACE/ADA: New Consensus Statement on Inpatient Glycemic Control (2013)  Target Ranges:  Prepandial:   less than 140 mg/dL      Peak postprandial:   less than 180 mg/dL (1-2 hours)      Critically ill patients:  140 - 180 mg/dL   Reason for Assessment: Hypoglycemia this morning  Note: Please consider changing correction scale to tid with meals in addition to meal coverage already ordered.  May need change to less intense scale-- sensitive correction scale from Glycemic Control order set-- if patient has any further hypoglycemia.  Please consider HS correction scale from Glycemic Control order set for HS coverage instead of [facility] scale.    Thank you.  Kiara Lutze S. Kiara Lincoln, RN, CNS, CDE Inpatient Diabetes Program, team pager [phone]

## 2013-04-15 NOTE — Progress Notes (Addendum)
      [address]       [place] [address]             [phone]      4 Days Post-Op Procedure(s) (LRB): CORONARY ARTERY BYPASS GRAFTING (CABG) (N/A)  Subjective:  Ms. Kiara [name] states she feels pretty good this morning.  She does state that yesterday she noticed she became more fatigued with ambulation.  She also states she occasionally feels short of breath. +BM  Objective: Vital signs in last 24 hours: Temp:  [98.1 F (36.7 C)-98.5 F (36.9 C)] 98.5 F (36.9 C) (09/16 0343) Pulse Rate:  [72-79] 79 (09/16 0343) Cardiac Rhythm:  [-] Normal sinus rhythm (09/16 0343) Resp:  [13-21] 18 (09/16 0343) BP: (115-140)/(35-72) 116/70 mmHg (09/16 0343) SpO2:  [92 %-100 %] 94 % (09/16 0343) Weight:  [211 lb 13.8 oz (96.1 kg)] 211 lb 13.8 oz (96.1 kg) (09/16 0343)  Intake/Output from previous day: 09/15 0701 - 09/16 0700 In: 420 [P.O.:420] Out: -   General appearance: alert, cooperative and no distress Heart: regular rate and rhythm Lungs: clear to auscultation bilaterally Abdomen: soft, non-tender; bowel sounds normal; no masses,  no organomegaly Extremities: edema +1 Bilateral LE Wound: clean and dry  Lab Results:  Recent Labs  04/14/13 0408 04/15/13 0609  WBC 14.0* 12.9*  HGB 9.1* 9.0*  HCT 26.3* 27.2*  PLT 165 142*   BMET:  Recent Labs  04/14/13 0408 04/15/13 0609  NA 135 138  K 4.6 4.8  CL 101 103  CO2 27 28  GLUCOSE 162* 78  BUN 40* 35*  CREATININE 1.21* 1.22*  CALCIUM 8.2* 8.8    PT/INR: No results found for this basename: LABPROT, INR,  in the last 72 hours ABG    Component Value Date/Time   PHART 7.382 04/12/2013 1041   HCO3 22.5 04/12/2013 1041   TCO2 22 04/12/2013 1712   ACIDBASEDEF 2.0 04/12/2013 1041   O2SAT 99.0 04/12/2013 1041   CBG (last 3)   Recent Labs  04/15/13 0731 04/15/13 0753 04/15/13 0826  GLUCAP 65* 65* 184*    Assessment/Plan: S/P Procedure(s) (LRB): CORONARY ARTERY BYPASS GRAFTING (CABG) (N/A)  1. CV-  NSR good rate and pressure control- continue Lopressor 2. Pulm- off oxygen, occasional dyspnea, will get 2V CXR in AM 3. Renal- creatinine at baseline, remains volume overloaded, will give IV Lasix today 4. DM- good control, continue insulin regimen for now, will restart pump at discharge 5. Expected Acute Blood Loss Anemia- remains stable at 9.0 6. Dispo- patient doing well, will diurese today, D/C EPW, likely d/c in 24-48 hours  LOS: 4 days    Lowella Dandy 04/15/2013   Chart reviewed, patient examined, agree with above. Will change insulin regimen as rec by diabetes coordinator.

## 2013-04-15 NOTE — Progress Notes (Signed)
CARDIAC REHAB PHASE I   PRE:  Rate/Rhythm: 74 SR  BP:  Supine:   Sitting: 94/60  Standing:    SaO2: 96 RA  MODE:  Ambulation: 350 ft   POST:  Rate/Rhythm: 85  BP:  Supine:   Sitting: 109/69  Standing:    SaO2: 97 RA 0820-0910 Assisted X 1 and used walker to ambulate. Out in hall pt c/o of seeing spots. Sat pt in chair and checked pt's BP 109/60, second person assisted [place] with rest of walk and pushed chair behind pt. She was able to walk total of 350 feet with no more sitting rest stops.No more c/o of seeing spots. Pt to chair after walk then to bathroom.  Melina Copa RN 04/15/2013 9:12 AM

## 2013-04-15 NOTE — Progress Notes (Signed)
Removed EPW per hospital policy per MD order. Pacing wires intact upon removal. VSS, no bleeding, patient tolerated well. Patient reminded to remain in bed for 1 hour. Will continue to moniotor closely. Lajuana Matte, RN

## 2013-04-16 ENCOUNTER — Inpatient Hospital Stay (HOSPITAL_COMMUNITY): Payer: Medicare FFS

## 2013-04-16 LAB — GLUCOSE, CAPILLARY
Glucose-Capillary: 80 mg/dL (ref 70–99)
Glucose-Capillary: 284 mg/dL — ABNORMAL HIGH (ref 70–99)
Glucose-Capillary: 54 mg/dL — ABNORMAL LOW (ref 70–99)
Glucose-Capillary: 68 mg/dL — ABNORMAL LOW (ref 70–99)

## 2013-04-16 MED ORDER — ALBUTEROL SULFATE HFA 108 (90 BASE) MCG/ACT IN AERS
2.0000 | INHALATION_SPRAY | RESPIRATORY_TRACT | Status: DC | PRN
  Filled 2013-04-16: qty 6.7

## 2013-04-16 MED ORDER — FUROSEMIDE 10 MG/ML IJ SOLN
40.0000 mg | Freq: Once | INTRAMUSCULAR | Status: AC
  Administered 2013-04-16: 40 mg via INTRAVENOUS
  Filled 2013-04-16: qty 4

## 2013-04-16 MED ORDER — BENAZEPRIL HCL 5 MG PO TABS
5.0000 mg | ORAL_TABLET | Freq: Every day | ORAL | Status: DC
  Administered 2013-04-16 – 2013-04-17 (×2): 5 mg via ORAL
  Filled 2013-04-16 (×2): qty 1

## 2013-04-16 NOTE — Progress Notes (Signed)
Pt. Ambulated 500 feet on [name]/A using 4 wheel rolling walker. Pt. Tolerated well and returned to sitting in chair. Will continue to monitor.  Kiara [name]

## 2013-04-16 NOTE — Progress Notes (Addendum)
      [address]       [place] [address]             [phone]      5 Days Post-Op Procedure(s) (LRB): CORONARY ARTERY BYPASS GRAFTING (CABG) (N/A)  Subjective:  Kiara [name] had episode of shortness of breath last evening.  She states that she just felt like she couldn't catch her breath.  She also suffered some wheezing.  She states she has used albuterol inhaler in the past.  She is ambulating with minimal difficulty +BM  Objective: Vital signs in last 24 hours: Temp:  [97.8 F (36.6 C)-98.6 F (37 C)] 97.8 F (36.6 C) (09/17 0404) Pulse Rate:  [68-76] 68 (09/17 0404) Cardiac Rhythm:  [-] Normal sinus rhythm (09/17 0404) Resp:  [16-18] 18 (09/17 0404) BP: (118-149)/(47-74) 134/55 mmHg (09/17 0404) SpO2:  [95 %-100 %] 99 % (09/17 0404) Weight:  [211 lb 3.2 oz (95.8 kg)] 211 lb 3.2 oz (95.8 kg) (09/17 0402)  Intake/Output from previous day: 09/16 0701 - 09/17 0700 In: 960 [P.O.:960] Out: 1850 [Urine:1850] Intake/Output this shift: Total I/O In: 120 [P.O.:120] Out: 300 [Urine:300]  General appearance: alert, cooperative and no distress Heart: regular rate and rhythm Lungs: diminished breath sounds left base Abdomen: soft, non-tender; bowel sounds normal; no masses,  no organomegaly Extremities: edema 1+ Wound: clean and dry  Lab Results:  Recent Labs  04/14/13 0408 04/15/13 0609  WBC 14.0* 12.9*  HGB 9.1* 9.0*  HCT 26.3* 27.2*  PLT 165 142*   BMET:  Recent Labs  04/14/13 0408 04/15/13 0609  NA 135 138  K 4.6 4.8  CL 101 103  CO2 27 28  GLUCOSE 162* 78  BUN 40* 35*  CREATININE 1.21* 1.22*  CALCIUM 8.2* 8.8    PT/INR: No results found for this basename: LABPROT, INR,  in the last 72 hours ABG    Component Value Date/Time   PHART 7.382 04/12/2013 1041   HCO3 22.5 04/12/2013 1041   TCO2 22 04/12/2013 1712   ACIDBASEDEF 2.0 04/12/2013 1041   O2SAT 99.0 04/12/2013 1041   CBG (last 3)   Recent Labs  04/15/13 1138  04/15/13 1612 04/15/13 1937  GLUCAP 295* 124* 116*    Assessment/Plan: S/P Procedure(s) (LRB): CORONARY ARTERY BYPASS GRAFTING (CABG) (N/A)  1. CV- NSR on lopressor, hypertension- will restart home Benazepril at decreased dose 2. Pulm- complaints of dyspnea and wheezing, CXR with small left sided pleural effusion, off oxygen with good saturations, will order Albuterol inhaler 3. Renal- creatinine at 1.22, remains volume overloaded, will continue diuresis, repeat BMET in AM 4. DM- CBGs remain well controlled, insulin regimen changed per recommendations, will resume insulin pump at discharge 5. Dispo- patient with complaints of dyspnea, will try Albuterol inhaler, CXR looks okay, start ACE for blood pressure and reduced EF, watch creatinine, if patient feels up to it and no issues arise overnight will plan to d/c in AM   LOS: 5 days    Kiara [name] 04/16/2013   Chart reviewed, patient examined, agree with above. She is doing fairly well. She still needs diuresis and should stay on a diuretic until she comes back to the office. She is not sure that her insulin pump is working correctly and would like to stay on the current regimen that we have her on in the hospital when she goes home until she can have her pump checked out.

## 2013-04-16 NOTE — Progress Notes (Signed)
Orthopedic Tech Progress Note Patient Details:  Kiara [name] 1951/06/24 [id number]  Ortho Devices Type of Ortho Device: Abdominal binder Ortho Device/Splint Location: used for breast binder Ortho Device/Splint Interventions: Application   Shawnie Pons 04/16/2013, 3:40 PM

## 2013-04-16 NOTE — Progress Notes (Signed)
CARDIAC REHAB PHASE I   PRE:  Rate/Rhythm: 80SR  BP:  Supine:   Sitting: 120/70  Standing:    SaO2: 97%RA  MODE:  Ambulation: 500 ft   POST:  Rate/Rhythm: 93SR  BP:  Supine:   Sitting: 150/64  Standing:    SaO2: 100%RA 0900-0934 Pt walked 500 ft on RA with rolling walker and asst x 1 with slow steady gait. Stopped several times to rest. C/o lower back hurting. Tolerated well otherwise. To bathroom after walk. Would recommend rolling walker for home use. Pt states we are getting her one delivered to room.   Luetta Nutting, RN BSN  04/16/2013 9:30 AM

## 2013-04-16 NOTE — Progress Notes (Signed)
Binder applied to breasts per PA order. Will continue to monitor pt closely.

## 2013-04-16 NOTE — Care Management Note (Unsigned)
    Page 1 of 2   04/16/2013     1:20:29 PM   CARE MANAGEMENT NOTE 04/16/2013  Patient:  [facility]   Account Number:  [id number]  Date Initiated:  04/14/2013  Documentation initiated by:  Gwendalynn Eckstrom  Subjective/Objective Assessment:   PT S/P CABG X2 ON 04/11/13.  PTA, PT INDEPENDENT OF ADLS.     Action/Plan:   MET WITH PT TO DISCUSS DC PLANS.  PT STATES FAMILY MEMBERS TO PROVIDE 24HR CARE AT DC.  WILL NEED HH FOLLOW UP, RW.   Anticipated DC Date:  04/17/2013   Anticipated DC Plan:  HOME W HOME HEALTH SERVICES      DC Planning Services  CM consult      [facility] Choice  HOME HEALTH   Choice offered to / List presented to:  C-1 Patient   DME arranged  [facility]      DME agency  [facility]     HH arranged  HH-1 RN      [facility] agency  [facility]   Status of service:  In process, will continue to follow Medicare Important Message given?   (If response is "NO", the following Medicare IM given date fields will be blank) Date Medicare IM given:   Date Additional Medicare IM given:    Discharge Disposition:  HOME W HOME HEALTH SERVICES  Per UR Regulation:  Reviewed for med. necessity/level of care/duration of stay  If discussed at Long Length of Stay Meetings, dates discussed:    Comments:  04/14/13 Pauline Pegues,RN,BSN [phone] REFERRAL TO [facility], IN [place], PER PT CHOICE. FAXED REFERRAL TO [facility] AT [phone].  FAXED REFERRAL TO [facility] FOR RW, PER PT REQUEST.  PT FOR DC 1-2 DAYS.

## 2013-04-16 NOTE — Progress Notes (Signed)
Pt does not wish to wear CPAP tonight. Pt encouraged to call RT if pt changes mind. No distress noted. 

## 2013-04-17 LAB — BASIC METABOLIC PANEL
BUN: 30 mg/dL — ABNORMAL HIGH (ref 6–23)
CO2: 29 mEq/L (ref 19–32)
Chloride: 97 mEq/L (ref 96–112)
Creatinine, Ser: 1.08 mg/dL (ref 0.50–1.10)
Glucose, Bld: 100 mg/dL — ABNORMAL HIGH (ref 70–99)

## 2013-04-17 LAB — GLUCOSE, CAPILLARY: Glucose-Capillary: 109 mg/dL — ABNORMAL HIGH (ref 70–99)

## 2013-04-17 MED ORDER — ASPIRIN 325 MG PO TBEC: 325.0000 mg | DELAYED_RELEASE_TABLET | Freq: Every day | ORAL | Status: DC

## 2013-04-17 MED ORDER — POTASSIUM CHLORIDE CRYS ER 20 MEQ PO TBCR: 20.0000 meq | EXTENDED_RELEASE_TABLET | Freq: Every day | ORAL | Status: DC

## 2013-04-17 MED ORDER — ALBUTEROL SULFATE HFA 108 (90 BASE) MCG/ACT IN AERS: 2.0000 | INHALATION_SPRAY | RESPIRATORY_TRACT | Status: DC | PRN

## 2013-04-17 MED ORDER — OXYCODONE HCL 5 MG PO TABS: 5.0000 mg | ORAL_TABLET | ORAL | Status: DC | PRN

## 2013-04-17 MED ORDER — FUROSEMIDE 40 MG PO TABS: 40.0000 mg | ORAL_TABLET | Freq: Every day | ORAL | Status: DC

## 2013-04-17 MED ORDER — METOPROLOL TARTRATE 25 MG PO TABS: 12.5000 mg | ORAL_TABLET | Freq: Two times a day (BID) | ORAL | Status: DC

## 2013-04-17 MED ORDER — BENAZEPRIL HCL 5 MG PO TABS: 5.0000 mg | ORAL_TABLET | Freq: Every day | ORAL | Status: DC

## 2013-04-17 MED ORDER — INSULIN DETEMIR 100 UNIT/ML ~~LOC~~ SOLN: 35.0000 [IU] | Freq: Every day | SUBCUTANEOUS | Status: DC

## 2013-04-17 MED ORDER — INSULIN ASPART 100 UNIT/ML ~~LOC~~ SOLN: 4.0000 [IU] | Freq: Three times a day (TID) | SUBCUTANEOUS | Status: DC

## 2013-04-17 NOTE — Progress Notes (Signed)
[address]       [place] [address]             [phone]      6 Days Post-Op  Procedure(s) (LRB): CORONARY ARTERY BYPASS GRAFTING (CABG) (N/A) Subjective: Feels much better today, breathing is comfortable   Objective  Telemetry sinus rhythm  Temp:  [98 F (36.7 C)-98.9 F (37.2 C)] 98 F (36.7 C) (09/18 0352) Pulse Rate:  [65-93] 93 (09/18 0352) Resp:  [16-18] 18 (09/18 0352) BP: (98-144)/(44-76) 132/76 mmHg (09/18 0352) SpO2:  [95 %-97 %] 95 % (09/18 0352) Weight:  [209 lb 3.5 oz (94.9 kg)] 209 lb 3.5 oz (94.9 kg) (09/18 0222)   Intake/Output Summary (Last 24 hours) at 04/17/13 0839 Last data filed at 04/17/13 9147  Gross per 24 hour  Intake   1320 ml  Output   3550 ml  Net  -2230 ml       General appearance: alert, cooperative and no distress Heart: regular rate and rhythm Lungs: mildly dim in bases Abdomen: benign Extremities: minor edema Wound: incisions healing well  Lab Results:  Recent Labs  04/15/13 0609 04/17/13 0445  NA 138 133*  K 4.8 4.7  CL 103 97  CO2 28 29  GLUCOSE 78 100*  BUN 35* 30*  CREATININE 1.22* 1.08  CALCIUM 8.8 8.5   No results found for this basename: AST, ALT, ALKPHOS, BILITOT, PROT, ALBUMIN,  in the last 72 hours No results found for this basename: LIPASE, AMYLASE,  in the last 72 hours  Recent Labs  04/15/13 0609  WBC 12.9*  HGB 9.0*  HCT 27.2*  MCV 93.5  PLT 142*   No results found for this basename: CKTOTAL, CKMB, TROPONINI,  in the last 72 hours No components found with this basename: POCBNP,  No results found for this basename: DDIMER,  in the last 72 hours No results found for this basename: HGBA1C,  in the last 72 hours No results found for this basename: CHOL, HDL, LDLCALC, TRIG, CHOLHDL,  in the last 72 hours No results found for this basename: TSH, T4TOTAL, FREET3, T3FREE, THYROIDAB,  in the last 72 hours No results found for this basename: VITAMINB12, FOLATE, FERRITIN,  TIBC, IRON, RETICCTPCT,  in the last 72 hours  Medications: Scheduled . aspirin EC  325 mg Oral Daily   Or  . aspirin  324 mg Per Tube Daily  . benazepril  5 mg Oral Daily  . bisacodyl  10 mg Oral Daily   Or  . bisacodyl  10 mg Rectal Daily  . docusate sodium  200 mg Oral Daily  . enoxaparin (LOVENOX) injection  40 mg Subcutaneous Q24H  . famotidine  40 mg Oral QHS  . furosemide  40 mg Oral Daily  . gabapentin  600 mg Oral TID  . guaiFENesin  600 mg Oral BID  . insulin aspart  0-15 Units Subcutaneous TID WC  . insulin aspart  0-5 Units Subcutaneous QHS  . insulin aspart  4 Units Subcutaneous TID WC  . insulin detemir  35 Units Subcutaneous Daily  . loratadine  10 mg Oral QPM  . metoprolol tartrate  12.5 mg Oral BID   Or  . metoprolol tartrate  12.5 mg Per Tube BID  . mupirocin ointment   Nasal BID  . pantoprazole  40 mg Oral Daily  . PARoxetine  20 mg Oral QHS  . potassium chloride  20 mEq Oral Daily  . simvastatin  40 mg Oral QPM  . sodium chloride  3 mL Intravenous Q12H  . sodium chloride  3 mL Intravenous Q12H     Radiology/Studies:  Dg Chest 2 View  04/16/2013   CLINICAL DATA:  Mid chest pain  EXAM: CHEST  2 VIEW  COMPARISON:  Chest radiograph 04/14/2013  FINDINGS: Sternotomy wires overlie normal cardiac silhouette. There is a small left effusion and left basilar atelectasis not changed from prior. No pulmonary edema. No pneumothorax.  IMPRESSION: No change in left pleural effusion. No pulmonary edema.   Electronically Signed   By: Genevive Bi M.D.   On: 04/16/2013 08:13    INR: Will add last result for INR, ABG once components are confirmed Will add last 4 CBG results once components are confirmed  Assessment/Plan: S/P Procedure(s) (LRB): CORONARY ARTERY BYPASS GRAFTING (CABG) (N/A) Plan for discharge: see discharge orders   LOS: 6 days    Mily Malecki E 9/18/20148:39 AM

## 2013-04-17 NOTE — Progress Notes (Signed)
CARDIAC REHAB PHASE I   Ed completed with husband present. Voiced understanding and requests her name be sent to [facility]. [phone]  Kiara [name] CES, ACSM 04/17/2013 9:48 AM

## 2013-05-05 ENCOUNTER — Other Ambulatory Visit: Payer: Self-pay | Admitting: *Deleted

## 2013-05-05 DIAGNOSIS — I2510 Atherosclerotic heart disease of native coronary artery without angina pectoris: Secondary | ICD-10-CM

## 2013-05-06 DIAGNOSIS — I259 Chronic ischemic heart disease, unspecified: Secondary | ICD-10-CM

## 2013-05-07 ENCOUNTER — Encounter: Payer: Self-pay | Admitting: Surgery

## 2013-05-07 ENCOUNTER — Ambulatory Visit (INDEPENDENT_AMBULATORY_CARE_PROVIDER_SITE_OTHER): Payer: Self-pay | Admitting: Surgery

## 2013-05-07 ENCOUNTER — Ambulatory Visit
Admission: RE | Admit: 2013-05-07 | Discharge: 2013-05-07 | Disposition: A | Discharge status: Home / Self Care | Payer: 59 | Source: Ambulatory Visit | Attending: Surgery | Admitting: Surgery

## 2013-05-07 VITALS — BP 136/65 | HR 81 | Resp 16 | Wt 194.0 lb

## 2013-05-07 DIAGNOSIS — I2510 Atherosclerotic heart disease of native coronary artery without angina pectoris: Secondary | ICD-10-CM

## 2013-05-07 DIAGNOSIS — Z951 Presence of aortocoronary bypass graft: Secondary | ICD-10-CM

## 2013-05-07 NOTE — Progress Notes (Signed)
[address]       [place] [address]             [phone]        HPI:  Patient returns for routine postoperative follow-up having undergone coronary bypass graft surgery x2 on 04/11/2013. The patient's early postoperative recovery while in the hospital was notable for an uncomplicated postoperative course. Since hospital discharge the patient reports that she has continued to feel better. She is walking daily without chest pain or shortness of breath. She has mild chest wall discomfort from the incision particularly when lying down at night. She has been watching her diet. She is back on her insulin pump and said she has had excellent blood sugar control.   Current Outpatient Prescriptions  Medication Sig Dispense Refill  . albuterol (PROVENTIL HFA;VENTOLIN HFA) 108 (90 BASE) MCG/ACT inhaler Inhale 2 puffs into the lungs every 4 (four) hours as needed for wheezing or shortness of breath.  1 Inhaler  0  . aspirin EC 325 MG EC tablet Take 1 tablet (325 mg total) by mouth daily.      . benazepril (LOTENSIN) 5 MG tablet Take 1 tablet (5 mg total) by mouth daily.  30 tablet  1  . clonazePAM (KLONOPIN) 1 MG tablet Take 0.5 mg by mouth at bedtime.      . fish oil-omega-3 fatty acids 1000 MG capsule Take 1 g by mouth daily.      . fluticasone (FLONASE) 50 MCG/ACT nasal spray Place 2 sprays into the nose daily as needed for allergies.       . furosemide (LASIX) 40 MG tablet Take 1 tablet (40 mg total) by mouth daily.  30 tablet  0  . gabapentin (NEURONTIN) 300 MG capsule Take 600 mg by mouth 3 (three) times daily.       . insulin aspart (NOVOLOG) 100 UNIT/ML injection Inject into the skin 5 (five) times daily. PUMP      . levocetirizine (XYZAL) 5 MG tablet Take 5 mg by mouth every evening.      . metoprolol tartrate (LOPRESSOR) 25 MG tablet Take 0.5 tablets (12.5 mg total) by mouth 2 (two) times daily.  30 tablet  1  . oxyCODONE (OXY IR/ROXICODONE) 5 MG immediate  release tablet Take 1-2 tablets (5-10 mg total) by mouth every 4 (four) hours as needed.  50 tablet  0  . PARoxetine (PAXIL) 20 MG tablet Take 20 mg by mouth at bedtime.       . potassium chloride SA (K-DUR,KLOR-CON) 20 MEQ tablet Take 1 tablet (20 mEq total) by mouth daily.  30 tablet  0  . ranitidine (ZANTAC) 300 MG capsule Take 300 mg by mouth every evening.      . simvastatin (ZOCOR) 40 MG tablet Take 40 mg by mouth every evening.      . polyethylene glycol (MIRALAX / GLYCOLAX) packet Take 17 g by mouth daily.       No current facility-administered medications for this visit.    Physical Exam: BP 136/65  Pulse 81  Resp 16  Wt 194 lb (87.998 kg)  BMI 34.37 kg/m2  SpO2 98% She looks well. Cardiac exam shows a regular rate and rhythm with normal heart sounds. Lung exam is clear. The chest incision is healing fairly well. There is mild eschar formation at the lower end of the incision with mild erythema along the edges in response to the eschar. The right leg incision is  healing well. There is trace right ankle edema and no left ankle edema.  Diagnostic Tests:  CLINICAL DATA:  Post CABG in September of 2014, follow-up   EXAM: CHEST  2 VIEW   COMPARISON:  Chest x-ray of 04/16/2013   FINDINGS: The small right pleural effusion has resolved. There has been some decrease in volume of the small left pleural effusion with left basilar volume loss. Cardiomegaly is stable. Median sternotomy sutures are intact.   IMPRESSION: 1. Resolution of small right pleural effusion. 2. Slight decrease in volume of small left pleural effusion with left basilar atelectasis.     Electronically Signed   By: Dwyane Dee M.D.   On: 05/07/2013 09:49   Impression:  Overall I think she is progressing well following her surgery. I told her she can discontinue the Lasix and potassium. I wrote her another prescription today for oxycodone IR 5 mg every 6 hours when necessary for pain #50. I told her  she could return to driving a car but should not lift anything heavier than 10 pounds for 3 months postoperatively. I asked her to keep the chest incision clean and dry and not to apply any creams or lotions over the area of eschar formation.  Plan:  She will continue followup with Dr. Wynona Neat and Dr. Purvis Sheffield and will contact me if she develops any problems with the incisions.

## 2013-05-27 ENCOUNTER — Ambulatory Visit (INDEPENDENT_AMBULATORY_CARE_PROVIDER_SITE_OTHER): Payer: 59 | Admitting: Cardiovascular Disease

## 2013-05-27 ENCOUNTER — Encounter: Payer: Self-pay | Admitting: Cardiovascular Disease

## 2013-05-27 VITALS — BP 154/75 | HR 80 | Ht 63.0 in | Wt 190.0 lb

## 2013-05-27 DIAGNOSIS — I10 Essential (primary) hypertension: Secondary | ICD-10-CM

## 2013-05-27 DIAGNOSIS — E785 Hyperlipidemia, unspecified: Secondary | ICD-10-CM

## 2013-05-27 DIAGNOSIS — I739 Peripheral vascular disease, unspecified: Secondary | ICD-10-CM

## 2013-05-27 DIAGNOSIS — F411 Generalized anxiety disorder: Secondary | ICD-10-CM

## 2013-05-27 DIAGNOSIS — R071 Chest pain on breathing: Secondary | ICD-10-CM

## 2013-05-27 DIAGNOSIS — Z951 Presence of aortocoronary bypass graft: Secondary | ICD-10-CM

## 2013-05-27 DIAGNOSIS — E109 Type 1 diabetes mellitus without complications: Secondary | ICD-10-CM

## 2013-05-27 DIAGNOSIS — G479 Sleep disorder, unspecified: Secondary | ICD-10-CM

## 2013-05-27 DIAGNOSIS — I2510 Atherosclerotic heart disease of native coronary artery without angina pectoris: Secondary | ICD-10-CM

## 2013-05-27 DIAGNOSIS — R0789 Other chest pain: Secondary | ICD-10-CM

## 2013-05-27 DIAGNOSIS — F419 Anxiety disorder, unspecified: Secondary | ICD-10-CM

## 2013-05-27 MED ORDER — CLONAZEPAM 1 MG PO TABS: 0.5000 mg | ORAL_TABLET | Freq: Every day | ORAL | Status: DC

## 2013-05-27 MED ORDER — BENAZEPRIL HCL 10 MG PO TABS: 10.0000 mg | ORAL_TABLET | Freq: Every day | ORAL | Status: DC

## 2013-05-27 MED ORDER — OXYCODONE HCL 5 MG PO TABS: 5.0000 mg | ORAL_TABLET | ORAL | Status: DC | PRN

## 2013-05-27 NOTE — Addendum Note (Signed)
Addended by: Derry Lory A on: 05/27/2013 01:57 PM   Modules accepted: Orders

## 2013-05-27 NOTE — Patient Instructions (Addendum)
Your physician recommends that you schedule a follow-up appointment in: 6 months  Your physician has recommended you make the following change in your medication:   1) INCREASE BENAZEPRIL TO 10MG  ONCE DAILY  2) DECREASE ASPIRIN TO 81MG  ONCE DAILY    Your physician recommends THAT YOU BE SCHEDULED TO START CARDIAC REHAB

## 2013-05-27 NOTE — Progress Notes (Signed)
Patient ID: Kiara [name], female   DOB: [birth year], 62 y.o.   MRN: [id number]      SUBJECTIVE: Mrs. Stogdill returns for routine cardiology follow-up after having undergone coronary bypass graft surgery x2 on 04/11/2013. 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  She is feeling much better. She has some chest wall pain and burning skin sensation, which bothers her most at night. She denies orthopnea and PND.    Allergies  Allergen Reactions  . Bactrim [Sulfamethoxazole-Trimethoprim] Rash  . Codeine Nausea And Vomiting  . Adhesive [Tape] Rash    Band aid brand   . Sulfamethoxazole-Tmp Ds Rash    Current Outpatient Prescriptions  Medication Sig Dispense Refill  . albuterol (PROVENTIL HFA;VENTOLIN HFA) 108 (90 BASE) MCG/ACT inhaler Inhale 2 puffs into the lungs every 4 (four) hours as needed for wheezing or shortness of breath.  1 Inhaler  0  . aspirin EC 325 MG EC tablet Take 1 tablet (325 mg total) by mouth daily.      . benazepril (LOTENSIN) 5 MG tablet Take 1 tablet (5 mg total) by mouth daily.  30 tablet  1  . clonazePAM (KLONOPIN) 1 MG tablet Take 0.5 mg by mouth at bedtime.      . fish oil-omega-3 fatty acids 1000 MG capsule Take 1 g by mouth daily.      . fluticasone (FLONASE) 50 MCG/ACT nasal spray Place 2 sprays into the nose daily as needed for allergies.       . furosemide (LASIX) 40 MG tablet Take 1 tablet (40 mg total) by mouth daily.  30 tablet  0  . gabapentin (NEURONTIN) 300 MG capsule Take 600 mg by mouth 3 (three) times daily.       . insulin aspart (NOVOLOG) 100 UNIT/ML injection Inject into the skin 5 (five) times daily. PUMP      . levocetirizine (XYZAL) 5 MG tablet Take 5 mg by mouth every evening.      . metoprolol tartrate (LOPRESSOR) 25 MG tablet Take 0.5 tablets (12.5 mg total) by mouth 2 (two) times daily.  30 tablet   1  . oxyCODONE (OXY IR/ROXICODONE) 5 MG immediate release tablet Take 1-2 tablets (5-10 mg total) by mouth every 4 (four) hours as needed.  50 tablet  0  . PARoxetine (PAXIL) 20 MG tablet Take 20 mg by mouth at bedtime.       . polyethylene glycol (MIRALAX / GLYCOLAX) packet Take 17 g by mouth daily.      . potassium chloride SA (K-DUR,KLOR-CON) 20 MEQ tablet Take 1 tablet (20 mEq total) by mouth daily.  30 tablet  0  . ranitidine (ZANTAC) 300 MG capsule Take 300 mg by mouth every evening.      . simvastatin (ZOCOR) 40 MG tablet Take 40 mg by mouth every evening.       No current facility-administered medications for this visit.    Past Medical History  Diagnosis Date  . Diabetes mellitus without complication   . Hypertension   . Coronary artery disease   . Diabetic peripheral neuropathy   . Hyperlipidemia   . GERD (gastroesophageal reflux disease)   . Umbilical hernia   . Eczema   . PONV (postoperative nausea and vomiting)   . Myocardial infarction   . Anxiety   . Shortness of breath   . H/O hiatal hernia   .  Headache(784.0)     allergies, sinus  . Arthritis   . Sleep apnea     Past Surgical History  Procedure Laterality Date  . Amputation of right 4th and 5th toe    . Abdominal hysterectomy    . Carpal tunnel release    . Cardiac catheterization    . Eye surgery Bilateral     laser  . Fracture surgery Left 2005  . Cesarean section  1983  . Coronary artery bypass graft N/A 04/11/2013    Procedure: CORONARY ARTERY BYPASS GRAFTING (CABG);  Surgeon: Alleen Borne, MD;  Location: [facility] OR;  Service: Open Heart Surgery;  Laterality: N/A;    History   Social History  . Marital Status: Married    Spouse Name: N/A    Number of Children: N/A  . Years of Education: N/A   Occupational History  . Not on file.   Social History Main Topics  . Smoking status: Former Smoker -- 25 years    Types: Cigarettes    Quit date: 05/30/2010  . Smokeless tobacco: Never Used      Comment: STOPPED 2007  . Alcohol Use: No  . Drug Use: No  . Sexual Activity: Not on file   Other Topics Concern  . Not on file   Social History Narrative  . No narrative on file     BP 154/75 Pulse 80   PHYSICAL EXAM General: NAD Neck: No JVD, no thyromegaly or thyroid nodule.  Lungs: Clear to auscultation bilaterally with normal respiratory effort. CV: Nondisplaced PMI.  Healing midline sternotomy incision. Heart regular S1/S2, no S3/S4, no murmur.  No peripheral edema.  No carotid bruit.  Normal pedal pulses.  Abdomen: Soft, nontender, no hepatosplenomegaly, no distention.  Neurologic: Alert and oriented x 3.  Psych: Normal affect. Extremities: No clubbing or cyanosis.   ECG: reviewed and available in electronic records.   Echo (01-30-2013): 1. Overall left ventricular ejection fraction is estimated at 40 to 45%.  2. Mildly to moderately decreased global left ventricular systolic function.  3. Left ventricular wall motion abnormalities exist. See wall motion findings.  4. Mild mitral annular calcification.  5. Elevated left ventricular end diastolic pressure by tissue Doppler.  6. No intracardiac thrombi, mass or vegetations.      ASSESSMENT AND PLAN: 1. CAD s/p 2-v CABG: She is on good medical therapy at present, which includes ASA, Metoprolol, Benazepril, and Simvastatin. I will reduce ASA to 81 mg daily. 2. HTN: elevated today, and thus I will increase benazepril to 10 mg daily. 3. Hyperlipidemia: recent lipids from 10/3 show HDL 34, LDL 57, TG 98. 4. PVD: I would consider checking ABI's in the future. 5. Diabetes mellitus: recent HbA1C 7.2%. 6. Chest wall pain: will prescribe thirty tablets of oxycodone (5 mg) prn for chest pain, with no refills. 7. Sleep disorder: will give a one time 90-day supply of clonazepam 1 mg tablets, 0.5 mg to be taken prn each evening.     Prentice Docker, M.D., F.A.C.C.

## 2013-07-04 ENCOUNTER — Encounter: Payer: Self-pay | Admitting: Cardiovascular Disease

## 2013-07-04 ENCOUNTER — Ambulatory Visit (INDEPENDENT_AMBULATORY_CARE_PROVIDER_SITE_OTHER): Payer: 59 | Admitting: Cardiovascular Disease

## 2013-07-04 VITALS — BP 168/73 | HR 73 | Ht 63.0 in | Wt 192.0 lb

## 2013-07-04 DIAGNOSIS — I10 Essential (primary) hypertension: Secondary | ICD-10-CM

## 2013-07-04 DIAGNOSIS — I2510 Atherosclerotic heart disease of native coronary artery without angina pectoris: Secondary | ICD-10-CM

## 2013-07-04 DIAGNOSIS — I739 Peripheral vascular disease, unspecified: Secondary | ICD-10-CM

## 2013-07-04 DIAGNOSIS — R609 Edema, unspecified: Secondary | ICD-10-CM

## 2013-07-04 DIAGNOSIS — R071 Chest pain on breathing: Secondary | ICD-10-CM

## 2013-07-04 DIAGNOSIS — M79609 Pain in unspecified limb: Secondary | ICD-10-CM

## 2013-07-04 DIAGNOSIS — E785 Hyperlipidemia, unspecified: Secondary | ICD-10-CM

## 2013-07-04 DIAGNOSIS — R0789 Other chest pain: Secondary | ICD-10-CM

## 2013-07-04 DIAGNOSIS — Z951 Presence of aortocoronary bypass graft: Secondary | ICD-10-CM

## 2013-07-04 DIAGNOSIS — M79604 Pain in right leg: Secondary | ICD-10-CM

## 2013-07-04 NOTE — Patient Instructions (Signed)
   Lower extremity doppler  Office will contact with results via phone or letter.   Continue all current medications. Your physician wants you to follow up in: 6 months.  You will receive a reminder letter in the mail one-two months in advance.  If you don't receive a letter, please call our office to schedule the follow up appointment

## 2013-07-04 NOTE — Progress Notes (Signed)
Patient ID: Kiara [name], female   DOB: 08-30-1950, 62 y.o.   MRN: [id number]      SUBJECTIVE: Kiara [name] had coronary bypass graft surgery x2 on 04/11/2013. 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  She recently began cardiac rehab in [place]. She has been having some soreness just above the site of vein harvesting on the inner aspect of her right thigh. She denies fevers, chills, and has not noticed any discoloration. She's had some mild swelling. She had some bilateral inframammary pain while repetitively folding laundry and putting it away the other day. She denies exertional chest pain and dyspnea.     Allergies  Allergen Reactions  . Bactrim [Sulfamethoxazole-Trimethoprim] Rash  . Codeine Nausea And Vomiting  . Adhesive [Tape] Rash    Band aid brand   . Sulfamethoxazole-Tmp Ds Rash    Current Outpatient Prescriptions  Medication Sig Dispense Refill  . albuterol (PROAIR HFA) 108 (90 BASE) MCG/ACT inhaler Inhale 2 puffs into the lungs every 4 (four) hours as needed.       Marland Kitchen aspirin 81 MG tablet Take 81 mg by mouth daily.      . benazepril (LOTENSIN) 10 MG tablet Take 1 tablet (10 mg total) by mouth daily.  90 tablet  2  . clonazePAM (KLONOPIN) 1 MG tablet Take 0.5 tablets (0.5 mg total) by mouth at bedtime.  90 tablet  0  . fluticasone (FLONASE) 50 MCG/ACT nasal spray Place 2 sprays into the nose daily as needed for allergies.       Marland Kitchen gabapentin (NEURONTIN) 300 MG capsule Take 600 mg by mouth 3 (three) times daily.       . insulin aspart (NOVOLOG) 100 UNIT/ML injection 1.25 Units 7:00pm-7 AM, 1.50 Units/hr 7AM--9 am,  1.75 u/hr 9AM- 2 PM, 1.4 U/H 2pm-7pm   Bolus 1 unit for every 15 carbs, 1 Unit for every 50 points greater than 150      . levocetirizine (XYZAL) 5 MG tablet Take 5 mg by mouth every evening.      . metoprolol  tartrate (LOPRESSOR) 25 MG tablet Take 0.5 tablets (12.5 mg total) by mouth 2 (two) times daily.  30 tablet  1  . Omega-3 Fatty Acids (OMEGA-3 FISH OIL) 1200 MG CAPS Take 1 capsule by mouth daily.       Marland Kitchen oxyCODONE (OXY IR/ROXICODONE) 5 MG immediate release tablet Take 1 tablet (5 mg total) by mouth every 4 (four) hours as needed for pain.  30 tablet  0  . PARoxetine (PAXIL) 20 MG tablet Take 20 mg by mouth at bedtime.       . polyethylene glycol (MIRALAX / GLYCOLAX) packet Take by mouth.      . ranitidine (ZANTAC) 300 MG capsule Take 300 mg by mouth every evening.      . simvastatin (ZOCOR) 40 MG tablet Take 40 mg by mouth every evening.       No current facility-administered medications for this visit.    Past Medical History  Diagnosis Date  . Diabetes mellitus without complication   . Hypertension   . Coronary artery disease   . Diabetic peripheral neuropathy   . Hyperlipidemia   . GERD (gastroesophageal reflux disease)   . Umbilical hernia   . Eczema   . PONV (postoperative nausea and vomiting)   . Myocardial infarction   . Anxiety   .  Shortness of breath   . H/O hiatal hernia   . Headache(784.0)     allergies, sinus  . Arthritis   . Sleep apnea     Past Surgical History  Procedure Laterality Date  . Amputation of right 4th and 5th toe    . Abdominal hysterectomy    . Carpal tunnel release    . Cardiac catheterization    . Eye surgery Bilateral     laser  . Fracture surgery Left 2005  . Cesarean section  1983  . Coronary artery bypass graft N/A 04/11/2013    Procedure: CORONARY ARTERY BYPASS GRAFTING (CABG);  Surgeon: Alleen Borne, MD;  Location: [facility] OR;  Service: Open Heart Surgery;  Laterality: N/A;    History   Social History  . Marital Status: Married    Spouse Name: N/A    Number of Children: N/A  . Years of Education: N/A   Occupational History  . Not on file.   Social History Main Topics  . Smoking status: Former Smoker -- 25 years    Types:  Cigarettes    Quit date: 05/30/2010  . Smokeless tobacco: Never Used     Comment: STOPPED 2007  . Alcohol Use: No  . Drug Use: No  . Sexual Activity: Not on file   Other Topics Concern  . Not on file   Social History Narrative  . No narrative on file     Filed Vitals:   07/04/13 1522  BP: 168/73  Pulse: 73  Height: 5\' 3"  (1.6 m)  Weight: 192 lb (87.091 kg)    PHYSICAL EXAM General: NAD Neck: No JVD, no thyromegaly or thyroid nodule.  Lungs: Clear to auscultation bilaterally with normal respiratory effort. CV: Nondisplaced PMI.  Well healed chest wall midline incision. Heart regular S1/S2, no S3/S4, no murmur.  Trace right peri-ankle edema.  No carotid bruit.  Normal pedal pulses.  Abdomen: Soft, nontender, no hepatosplenomegaly, no distention.  Neurologic: Alert and oriented x 3.  Psych: Normal affect. Extremities: No clubbing or cyanosis. Well healed incision on medial aspect of right thigh. Very mild tenderness. No discoloration. No discharge.  ECG: reviewed and available in electronic records.   Echo (01-30-2013):  1. Overall left ventricular ejection fraction is estimated at 40 to 45%.  2. Mildly to moderately decreased global left ventricular systolic function.  3. Left ventricular wall motion abnormalities exist. See wall motion findings.  4. Mild mitral annular calcification.  5. Elevated left ventricular end diastolic pressure by tissue Doppler.  6. No intracardiac thrombi, mass or vegetations.    ASSESSMENT AND PLAN:  1. CAD s/p 2-v CABG: She is on good medical therapy at present, which includes ASA, Metoprolol, Benazepril, and Simvastatin.  2. Right leg swelling: will obtain an ultrasound to evaluate for venous thrombosis. 3. Hyperlipidemia: recent lipids from 10/3 show HDL 34, LDL 57, TG 98.  4. PVD: I would consider checking ABI's in the future.  5. Diabetes mellitus: recent HbA1C 7.2%.  6. Chest wall pain: she experiences this occasionally with  exertion.  Dispo: f/u 6 months.     Prentice Docker, M.D., F.A.C.C.

## 2013-07-09 ENCOUNTER — Encounter (INDEPENDENT_AMBULATORY_CARE_PROVIDER_SITE_OTHER): Payer: 59

## 2013-07-09 DIAGNOSIS — R609 Edema, unspecified: Secondary | ICD-10-CM

## 2013-07-09 DIAGNOSIS — M79604 Pain in right leg: Secondary | ICD-10-CM

## 2013-07-09 DIAGNOSIS — M7989 Other specified soft tissue disorders: Secondary | ICD-10-CM

## 2013-07-09 DIAGNOSIS — M79609 Pain in unspecified limb: Secondary | ICD-10-CM

## 2013-07-21 ENCOUNTER — Other Ambulatory Visit: Payer: Self-pay | Admitting: *Deleted

## 2013-07-21 MED ORDER — METOPROLOL TARTRATE 25 MG PO TABS: 12.5000 mg | ORAL_TABLET | Freq: Two times a day (BID) | ORAL | Status: DC

## 2013-07-28 ENCOUNTER — Telehealth: Payer: Self-pay | Admitting: Cardiovascular Disease

## 2013-07-28 NOTE — Telephone Encounter (Signed)
Please see paper refill in refill bin / tgs

## 2013-07-30 ENCOUNTER — Telehealth: Payer: Self-pay | Admitting: Cardiovascular Disease

## 2013-07-30 NOTE — Telephone Encounter (Signed)
**   Clarification / Please see paper in refill bin / tgs**

## 2013-11-06 ENCOUNTER — Ambulatory Visit: Payer: 59 | Admitting: Cardiovascular Disease

## 2013-12-31 ENCOUNTER — Encounter: Payer: Self-pay | Admitting: Cardiovascular Disease

## 2013-12-31 ENCOUNTER — Ambulatory Visit (INDEPENDENT_AMBULATORY_CARE_PROVIDER_SITE_OTHER): Payer: 59 | Admitting: Cardiovascular Disease

## 2013-12-31 VITALS — BP 106/60 | HR 64 | Ht 63.0 in | Wt 192.0 lb

## 2013-12-31 DIAGNOSIS — I10 Essential (primary) hypertension: Secondary | ICD-10-CM

## 2013-12-31 DIAGNOSIS — I25810 Atherosclerosis of coronary artery bypass graft(s) without angina pectoris: Secondary | ICD-10-CM

## 2013-12-31 DIAGNOSIS — I739 Peripheral vascular disease, unspecified: Secondary | ICD-10-CM

## 2013-12-31 DIAGNOSIS — E785 Hyperlipidemia, unspecified: Secondary | ICD-10-CM

## 2013-12-31 DIAGNOSIS — E109 Type 1 diabetes mellitus without complications: Secondary | ICD-10-CM

## 2013-12-31 DIAGNOSIS — F419 Anxiety disorder, unspecified: Secondary | ICD-10-CM

## 2013-12-31 DIAGNOSIS — F411 Generalized anxiety disorder: Secondary | ICD-10-CM

## 2013-12-31 NOTE — Progress Notes (Signed)
Patient ID: Kiara [name], female   DOB: [birth year], 63 y.o.   MRN: [id number]      SUBJECTIVE: Kiara [name] had coronary bypass graft surgery x2 on 04/11/2013. 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  She has been doing well and denies significant chest pain, shortness of breath, orthopnea, paroxysmal nocturnal dyspnea, and significant leg swelling. Her 57 year old mother is living with her and this has caused a lot of anxiety and stress. She has also had a lot of social stressors related to other family members.  She believes she is undergoing menopause and has had a lot of difficulty losing weight, in spite of changing her diet. She avoids fried foods altogether and eats only frozen or fresh vegetables.   Allergies  Allergen Reactions  . Bactrim [Sulfamethoxazole-Trimethoprim] Rash  . Codeine Nausea And Vomiting  . Adhesive [Tape] Rash    Band aid brand   . Sulfamethoxazole-Tmp Ds Rash    Current Outpatient Prescriptions  Medication Sig Dispense Refill  . albuterol (PROAIR HFA) 108 (90 BASE) MCG/ACT inhaler Inhale 2 puffs into the lungs every 4 (four) hours as needed.       Marland Kitchen aspirin EC 81 MG tablet Take by mouth.      . benazepril (LOTENSIN) 10 MG tablet Take by mouth.      . clonazePAM (KLONOPIN) 1 MG tablet Take 0.5 tablets (0.5 mg total) by mouth at bedtime.  90 tablet  0  . fluticasone (FLONASE) 50 MCG/ACT nasal spray Place into the nose.      . gabapentin (NEURONTIN) 300 MG capsule Take by mouth.      . insulin aspart (NOVOLOG) 100 UNIT/ML injection 1.25 Units 7:00pm-7 AM, 1.50 Units/hr 7AM--9 am,  1.75 u/hr 9AM- 2 PM, 1.5 U/H 2pm-midnight Bolus 1 unit for every 15 carbs, 1 Unit for every 50 points greater than 150      . levocetirizine (XYZAL) 5 MG tablet TAKE 1 TABLET EVERY DAY (NEEDS APPOINTMENT AND LABS)      . metoprolol  tartrate (LOPRESSOR) 25 MG tablet Take 0.5 tablets (12.5 mg total) by mouth 2 (two) times daily.  90 tablet  4  . Omega-3 Fatty Acids (OMEGA-3 FISH OIL) 1200 MG CAPS Take 1 capsule by mouth daily.       Marland Kitchen PARoxetine (PAXIL) 20 MG tablet Take 20 mg by mouth at bedtime.       . polyethylene glycol (MIRALAX / GLYCOLAX) packet Take by mouth.      . ranitidine (ZANTAC) 300 MG tablet TAKE 1 TABLET EVERY DAY      . simvastatin (ZOCOR) 40 MG tablet TAKE 1 TABLET EVERY DAY (NEEDS APPOINTMENT/LABS)      . triazolam (HALCION) 0.25 MG tablet Take by mouth.       No current facility-administered medications for this visit.    Past Medical History  Diagnosis Date  . Diabetes mellitus without complication   . Hypertension   . Coronary artery disease   . Diabetic peripheral neuropathy   . Hyperlipidemia   . GERD (gastroesophageal reflux disease)   . Umbilical hernia   . Eczema   . PONV (postoperative nausea and vomiting)   . Myocardial infarction   . Anxiety   . Shortness of breath   . H/O hiatal hernia   . Headache(784.0)     allergies, sinus  . Arthritis   .  Sleep apnea     Past Surgical History  Procedure Laterality Date  . Amputation of right 4th and 5th toe    . Abdominal hysterectomy    . Carpal tunnel release    . Cardiac catheterization    . Eye surgery Bilateral     laser  . Fracture surgery Left 2005  . Cesarean section  1983  . Coronary artery bypass graft N/A 04/11/2013    Procedure: CORONARY ARTERY BYPASS GRAFTING (CABG);  Surgeon: Alleen BorneBryan K Bartle, MD;  [facility];  Service: Open Heart Surgery;  Laterality: N/A;    History   Social History  . Marital Status: Married    Spouse Name: N/A    Number of Children: N/A  . Years of Education: N/A   Occupational History  . Not on file.   Social History Main Topics  . Smoking status: Former Smoker -- 25 years    Types: Cigarettes    Quit date: 05/30/2010  . Smokeless tobacco: Never Used     Comment: STOPPED 2007    . Alcohol Use: No  . Drug Use: No  . Sexual Activity: Not on file   Other Topics Concern  . Not on file   Social History Narrative  . No narrative on file     Filed Vitals:   12/31/13 1410  BP: 106/60  Pulse: 64  Height: 5\' 3"  (1.6 m)  Weight: 192 lb (87.091 kg)  SpO2: 99%    PHYSICAL EXAM General: NAD Neck: No JVD, no thyromegaly. Lungs: Clear to auscultation bilaterally with normal respiratory effort. CV: Nondisplaced PMI.  Regular rate and rhythm, normal S1/S2, no S3/S4, no murmur. No pretibial or periankle edema.  No carotid bruit.  Normal pedal pulses.  Abdomen: Soft, nontender, no hepatosplenomegaly, no distention.  Neurologic: Alert and oriented x 3.  Psych: Normal affect. Extremities: No clubbing or cyanosis.   ECG: reviewed and available in electronic records.      ASSESSMENT AND PLAN: 1. CAD s/p 2-v CABG: She is on good medical therapy at present, which includes ASA, metoprolol, benazepril, and simvastatin.  2. Hyperlipidemia: Will obtain lipids at next visit. Continue statin.  3. PVD: I would consider checking ABI's in the future.  4. Diabetes mellitus: Defer to PCP. She is on insulin. 5. Anxiety/stress: I have encouraged her to take morning walks for 30 minutes to help alleviate stress and anxiety, and to promote cardiovascular health. 6. HTN: Controlled on present therapy which includes benazepril and metoprolol.  Dispo: f/u 6 months.   Prentice DockerSuresh Devetta Hagenow, M.D., F.A.C.C.

## 2013-12-31 NOTE — Patient Instructions (Signed)
Your physician recommends that you schedule a follow-up appointment in: 6 months with Dr Higinio Plan will receive a reminder letter two months in advance reminding you to call and schedule your appointment. If you don't receive this letter, please contact our office.   .Your physician recommends that you continue on your current medications as directed. Please refer to the Current Medication list given to you today.

## 2014-02-11 ENCOUNTER — Encounter: Payer: Self-pay | Admitting: *Deleted

## 2014-05-02 ENCOUNTER — Other Ambulatory Visit: Payer: Self-pay | Admitting: Cardiovascular Disease

## 2014-07-27 ENCOUNTER — Other Ambulatory Visit: Payer: Self-pay | Admitting: Cardiovascular Disease

## 2014-08-05 ENCOUNTER — Other Ambulatory Visit: Payer: Self-pay | Admitting: Cardiovascular Disease

## 2014-10-29 ENCOUNTER — Other Ambulatory Visit: Payer: Self-pay | Admitting: Cardiovascular Disease

## 2017-08-02 ENCOUNTER — Inpatient Hospital Stay (HOSPITAL_COMMUNITY): Payer: Medicare PPO

## 2017-08-02 ENCOUNTER — Inpatient Hospital Stay (HOSPITAL_COMMUNITY)
Admission: AD | Admit: 2017-08-02 | Discharge: 2017-08-09 | DRG: 280 | Disposition: A | Discharge status: Home / Self Care | Payer: Medicare PPO | Source: Other Acute Inpatient Hospital | Attending: Family Medicine | Admitting: Family Medicine

## 2017-08-02 ENCOUNTER — Other Ambulatory Visit: Payer: Self-pay

## 2017-08-02 DIAGNOSIS — Z951 Presence of aortocoronary bypass graft: Secondary | ICD-10-CM | POA: Diagnosis not present

## 2017-08-02 DIAGNOSIS — Z7982 Long term (current) use of aspirin: Secondary | ICD-10-CM

## 2017-08-02 DIAGNOSIS — I5021 Acute systolic (congestive) heart failure: Secondary | ICD-10-CM | POA: Diagnosis not present

## 2017-08-02 DIAGNOSIS — D649 Anemia, unspecified: Secondary | ICD-10-CM | POA: Diagnosis present

## 2017-08-02 DIAGNOSIS — H70001 Acute mastoiditis without complications, right ear: Secondary | ICD-10-CM | POA: Diagnosis not present

## 2017-08-02 DIAGNOSIS — K219 Gastro-esophageal reflux disease without esophagitis: Secondary | ICD-10-CM | POA: Diagnosis present

## 2017-08-02 DIAGNOSIS — Z91048 Other nonmedicinal substance allergy status: Secondary | ICD-10-CM

## 2017-08-02 DIAGNOSIS — N179 Acute kidney failure, unspecified: Secondary | ICD-10-CM | POA: Diagnosis not present

## 2017-08-02 DIAGNOSIS — I2584 Coronary atherosclerosis due to calcified coronary lesion: Secondary | ICD-10-CM | POA: Diagnosis present

## 2017-08-02 DIAGNOSIS — Z9641 Presence of insulin pump (external) (internal): Secondary | ICD-10-CM | POA: Diagnosis present

## 2017-08-02 DIAGNOSIS — Z881 Allergy status to other antibiotic agents status: Secondary | ICD-10-CM

## 2017-08-02 DIAGNOSIS — I214 Non-ST elevation (NSTEMI) myocardial infarction: Secondary | ICD-10-CM | POA: Diagnosis present

## 2017-08-02 DIAGNOSIS — E10649 Type 1 diabetes mellitus with hypoglycemia without coma: Secondary | ICD-10-CM | POA: Diagnosis not present

## 2017-08-02 DIAGNOSIS — I272 Pulmonary hypertension, unspecified: Secondary | ICD-10-CM | POA: Diagnosis present

## 2017-08-02 DIAGNOSIS — Z8249 Family history of ischemic heart disease and other diseases of the circulatory system: Secondary | ICD-10-CM | POA: Diagnosis not present

## 2017-08-02 DIAGNOSIS — I2510 Atherosclerotic heart disease of native coronary artery without angina pectoris: Secondary | ICD-10-CM | POA: Diagnosis present

## 2017-08-02 DIAGNOSIS — F419 Anxiety disorder, unspecified: Secondary | ICD-10-CM | POA: Diagnosis present

## 2017-08-02 DIAGNOSIS — Z90710 Acquired absence of both cervix and uterus: Secondary | ICD-10-CM

## 2017-08-02 DIAGNOSIS — L309 Dermatitis, unspecified: Secondary | ICD-10-CM | POA: Diagnosis present

## 2017-08-02 DIAGNOSIS — I10 Essential (primary) hypertension: Secondary | ICD-10-CM | POA: Diagnosis not present

## 2017-08-02 DIAGNOSIS — I5043 Acute on chronic combined systolic (congestive) and diastolic (congestive) heart failure: Secondary | ICD-10-CM | POA: Diagnosis present

## 2017-08-02 DIAGNOSIS — I739 Peripheral vascular disease, unspecified: Secondary | ICD-10-CM | POA: Diagnosis not present

## 2017-08-02 DIAGNOSIS — E785 Hyperlipidemia, unspecified: Secondary | ICD-10-CM | POA: Diagnosis present

## 2017-08-02 DIAGNOSIS — Z89421 Acquired absence of other right toe(s): Secondary | ICD-10-CM

## 2017-08-02 DIAGNOSIS — I509 Heart failure, unspecified: Secondary | ICD-10-CM

## 2017-08-02 DIAGNOSIS — I110 Hypertensive heart disease with heart failure: Secondary | ICD-10-CM | POA: Diagnosis present

## 2017-08-02 DIAGNOSIS — R0600 Dyspnea, unspecified: Secondary | ICD-10-CM

## 2017-08-02 DIAGNOSIS — E109 Type 1 diabetes mellitus without complications: Secondary | ICD-10-CM | POA: Diagnosis present

## 2017-08-02 DIAGNOSIS — R0602 Shortness of breath: Secondary | ICD-10-CM

## 2017-08-02 DIAGNOSIS — Z888 Allergy status to other drugs, medicaments and biological substances status: Secondary | ICD-10-CM

## 2017-08-02 DIAGNOSIS — I5023 Acute on chronic systolic (congestive) heart failure: Secondary | ICD-10-CM | POA: Diagnosis not present

## 2017-08-02 DIAGNOSIS — I255 Ischemic cardiomyopathy: Secondary | ICD-10-CM | POA: Diagnosis present

## 2017-08-02 DIAGNOSIS — J9601 Acute respiratory failure with hypoxia: Secondary | ICD-10-CM | POA: Diagnosis present

## 2017-08-02 DIAGNOSIS — E1051 Type 1 diabetes mellitus with diabetic peripheral angiopathy without gangrene: Secondary | ICD-10-CM | POA: Diagnosis not present

## 2017-08-02 DIAGNOSIS — Z79899 Other long term (current) drug therapy: Secondary | ICD-10-CM

## 2017-08-02 DIAGNOSIS — E1042 Type 1 diabetes mellitus with diabetic polyneuropathy: Secondary | ICD-10-CM | POA: Diagnosis present

## 2017-08-02 DIAGNOSIS — Z794 Long term (current) use of insulin: Secondary | ICD-10-CM

## 2017-08-02 DIAGNOSIS — I252 Old myocardial infarction: Secondary | ICD-10-CM

## 2017-08-02 DIAGNOSIS — M1990 Unspecified osteoarthritis, unspecified site: Secondary | ICD-10-CM | POA: Diagnosis present

## 2017-08-02 DIAGNOSIS — Z91040 Latex allergy status: Secondary | ICD-10-CM

## 2017-08-02 DIAGNOSIS — R748 Abnormal levels of other serum enzymes: Secondary | ICD-10-CM | POA: Diagnosis not present

## 2017-08-02 DIAGNOSIS — G4733 Obstructive sleep apnea (adult) (pediatric): Secondary | ICD-10-CM | POA: Diagnosis present

## 2017-08-02 DIAGNOSIS — Z882 Allergy status to sulfonamides status: Secondary | ICD-10-CM

## 2017-08-02 DIAGNOSIS — Z885 Allergy status to narcotic agent status: Secondary | ICD-10-CM

## 2017-08-02 DIAGNOSIS — Z87891 Personal history of nicotine dependence: Secondary | ICD-10-CM

## 2017-08-02 LAB — CBC WITH DIFFERENTIAL/PLATELET
BASOS PCT: 0 %
Basophils Absolute: 0 10*3/uL (ref 0.0–0.1)
EOS ABS: 0.5 10*3/uL (ref 0.0–0.7)
Eosinophils Relative: 4 %
HCT: 31.8 % — ABNORMAL LOW (ref 36.0–46.0)
HEMOGLOBIN: 9.9 g/dL — AB (ref 12.0–15.0)
Lymphocytes Relative: 19 %
Lymphs Abs: 2.4 10*3/uL (ref 0.7–4.0)
MCH: 29.6 pg (ref 26.0–34.0)
MCHC: 31.1 g/dL (ref 30.0–36.0)
MCV: 95.2 fL (ref 78.0–100.0)
Monocytes Absolute: 1 10*3/uL (ref 0.1–1.0)
Monocytes Relative: 8 %
NEUTROS PCT: 69 %
Neutro Abs: 8.9 10*3/uL — ABNORMAL HIGH (ref 1.7–7.7)
Platelets: 449 10*3/uL — ABNORMAL HIGH (ref 150–400)
RBC: 3.34 MIL/uL — AB (ref 3.87–5.11)
RDW: 15.7 % — ABNORMAL HIGH (ref 11.5–15.5)
WBC: 12.8 10*3/uL — AB (ref 4.0–10.5)

## 2017-08-02 LAB — BRAIN NATRIURETIC PEPTIDE: B Natriuretic Peptide: 1347.3 pg/mL — ABNORMAL HIGH (ref 0.0–100.0)

## 2017-08-02 LAB — COMPREHENSIVE METABOLIC PANEL
ALBUMIN: 2.5 g/dL — AB (ref 3.5–5.0)
ALK PHOS: 139 U/L — AB (ref 38–126)
ALT: 33 U/L (ref 14–54)
ANION GAP: 10 (ref 5–15)
AST: 34 U/L (ref 15–41)
BUN: 30 mg/dL — ABNORMAL HIGH (ref 6–20)
CALCIUM: 8.5 mg/dL — AB (ref 8.9–10.3)
CO2: 25 mmol/L (ref 22–32)
CREATININE: 1.56 mg/dL — AB (ref 0.44–1.00)
Chloride: 99 mmol/L — ABNORMAL LOW (ref 101–111)
GFR calc non Af Amer: 34 mL/min — ABNORMAL LOW (ref >60)
GFR, EST AFRICAN AMERICAN: 39 mL/min — AB (ref >60)
Glucose, Bld: 102 mg/dL — ABNORMAL HIGH (ref 65–99)
Potassium: 4.9 mmol/L (ref 3.5–5.1)
SODIUM: 134 mmol/L — AB (ref 135–145)
Total Bilirubin: 0.5 mg/dL (ref 0.3–1.2)
Total Protein: 6.5 g/dL (ref 6.5–8.1)

## 2017-08-02 LAB — GLUCOSE, CAPILLARY
GLUCOSE-CAPILLARY: 80 mg/dL (ref 65–99)
Glucose-Capillary: 112 mg/dL — ABNORMAL HIGH (ref 65–99)

## 2017-08-02 LAB — TROPONIN I: TROPONIN I: 3.44 ng/mL — AB (ref <0.03)

## 2017-08-02 MED ORDER — INSULIN ASPART 100 UNIT/ML ~~LOC~~ SOLN
0.0000 [IU] | SUBCUTANEOUS | Status: DC
  Administered 2017-08-03: 2 [IU] via SUBCUTANEOUS
  Administered 2017-08-03: 3 [IU] via SUBCUTANEOUS
  Administered 2017-08-03: 5 [IU] via SUBCUTANEOUS
  Administered 2017-08-03 – 2017-08-04 (×4): 9 [IU] via SUBCUTANEOUS
  Administered 2017-08-04: 5 [IU] via SUBCUTANEOUS
  Administered 2017-08-05: 3 [IU] via SUBCUTANEOUS
  Administered 2017-08-05: 1 [IU] via SUBCUTANEOUS
  Administered 2017-08-05: 2 [IU] via SUBCUTANEOUS
  Administered 2017-08-05: 7 [IU] via SUBCUTANEOUS
  Administered 2017-08-06: 2 [IU] via SUBCUTANEOUS

## 2017-08-02 MED ORDER — ONDANSETRON HCL 4 MG/2ML IJ SOLN: 4.0000 mg | Freq: Four times a day (QID) | INTRAMUSCULAR | Status: DC | PRN

## 2017-08-02 MED ORDER — ENOXAPARIN SODIUM 80 MG/0.8ML ~~LOC~~ SOLN
80.0000 mg | Freq: Two times a day (BID) | SUBCUTANEOUS | Status: DC
Start: 2017-08-02 — End: 2017-08-02

## 2017-08-02 MED ORDER — INSULIN ASPART 100 UNIT/ML ~~LOC~~ SOLN: 1.7000 [IU] | SUBCUTANEOUS | Status: DC

## 2017-08-02 MED ORDER — ASPIRIN EC 81 MG PO TBEC
81.0000 mg | DELAYED_RELEASE_TABLET | Freq: Every day | ORAL | Status: DC
  Administered 2017-08-02 – 2017-08-09 (×8): 81 mg via ORAL
  Filled 2017-08-02 (×8): qty 1

## 2017-08-02 MED ORDER — ATORVASTATIN CALCIUM 80 MG PO TABS
80.0000 mg | ORAL_TABLET | Freq: Every day | ORAL | Status: DC
Start: 2017-08-02 — End: 2017-08-09
  Administered 2017-08-02 – 2017-08-08 (×7): 80 mg via ORAL
  Filled 2017-08-02 (×7): qty 1

## 2017-08-02 MED ORDER — FUROSEMIDE 10 MG/ML IJ SOLN
60.0000 mg | Freq: Two times a day (BID) | INTRAMUSCULAR | Status: DC
  Administered 2017-08-02 – 2017-08-04 (×4): 60 mg via INTRAVENOUS
  Filled 2017-08-02 (×4): qty 6

## 2017-08-02 MED ORDER — ENOXAPARIN SODIUM 40 MG/0.4ML ~~LOC~~ SOLN
40.0000 mg | SUBCUTANEOUS | Status: DC
  Administered 2017-08-02: 40 mg via SUBCUTANEOUS
  Filled 2017-08-02: qty 0.4

## 2017-08-02 MED ORDER — ENOXAPARIN SODIUM 80 MG/0.8ML ~~LOC~~ SOLN: 80.0000 mg | Freq: Two times a day (BID) | SUBCUTANEOUS | Status: DC

## 2017-08-02 MED ORDER — INSULIN PUMP
Freq: Three times a day (TID) | SUBCUTANEOUS | Status: DC
  Filled 2017-08-02: qty 1

## 2017-08-02 MED ORDER — SODIUM CHLORIDE 0.9% FLUSH
3.0000 mL | Freq: Two times a day (BID) | INTRAVENOUS | Status: DC
  Administered 2017-08-02 – 2017-08-06 (×7): 3 mL via INTRAVENOUS
  Administered 2017-08-06: 10 mL via INTRAVENOUS
  Administered 2017-08-07 – 2017-08-09 (×5): 3 mL via INTRAVENOUS

## 2017-08-02 MED ORDER — ACETAMINOPHEN 325 MG PO TABS
650.0000 mg | ORAL_TABLET | ORAL | Status: DC | PRN
Start: 2017-08-02 — End: 2017-08-09
  Administered 2017-08-02 – 2017-08-08 (×7): 650 mg via ORAL
  Filled 2017-08-02 (×7): qty 2

## 2017-08-02 MED ORDER — ENOXAPARIN SODIUM 40 MG/0.4ML ~~LOC~~ SOLN: 40.0000 mg | Freq: Once | SUBCUTANEOUS | Status: DC

## 2017-08-02 MED ORDER — METOPROLOL TARTRATE 12.5 MG HALF TABLET
12.5000 mg | ORAL_TABLET | Freq: Two times a day (BID) | ORAL | Status: DC
  Administered 2017-08-02 – 2017-08-03 (×3): 12.5 mg via ORAL
  Filled 2017-08-02 (×4): qty 1

## 2017-08-02 MED ORDER — PIPERACILLIN-TAZOBACTAM 3.375 G IVPB
3.3750 g | Freq: Three times a day (TID) | INTRAVENOUS | Status: DC
  Administered 2017-08-02 – 2017-08-06 (×11): 3.375 g via INTRAVENOUS
  Filled 2017-08-02 (×15): qty 50

## 2017-08-02 MED ORDER — SODIUM CHLORIDE 0.9% FLUSH: 3.0000 mL | INTRAVENOUS | Status: DC | PRN

## 2017-08-02 MED ORDER — SODIUM CHLORIDE 0.9 % IV SOLN: 250.0000 mL | INTRAVENOUS | Status: DC | PRN

## 2017-08-02 MED ORDER — CIPROFLOXACIN-DEXAMETHASONE 0.3-0.1 % OT SUSP
4.0000 [drp] | Freq: Two times a day (BID) | OTIC | Status: DC
  Administered 2017-08-02 – 2017-08-09 (×13): 4 [drp] via OTIC
  Filled 2017-08-02: qty 7.5

## 2017-08-02 NOTE — Progress Notes (Signed)
ANTICOAGULATION CONSULT NOTE - Initial Consult  Pharmacy Consult:  Lovenox Indication: chest pain/ACS  Allergies  Allergen Reactions  . Bactrim [Sulfamethoxazole-Trimethoprim] Rash  . Codeine Nausea And Vomiting  . Mobic [Meloxicam]     swelling  . Morphine And Related Nausea Only and Other (See Comments)    confusion  . Silvadene [Silver Sulfadiazine]     Red hot rash and blisters  . Adhesive [Tape] Rash    Band aid brand  laxex  . Sulfamethoxazole-Trimethoprim Rash    Patient Measurements: Height: 5\' 3"  (160 cm) Weight: 205 lb (93 kg) IBW/kg (Calculated) : 52.4  Vital Signs: Temp: 97.5 F (36.4 C) (01/03 2033) Temp Source: Oral (01/03 2033) BP: 140/67 (01/03 2033) Pulse Rate: 92 (01/03 2033)  Labs: Recent Labs    08/02/17 1929  HGB 9.9*  HCT 31.8*  PLT 449*  CREATININE 1.56*  TROPONINI 3.44*    Estimated Creatinine Clearance: 39.5 mL/min (A) (by C-G formula based on SCr of 1.56 mg/dL (H)).   Medical History: Past Medical History:  Diagnosis Date  . Anxiety   . Arthritis   . Coronary artery disease   . Diabetes mellitus without complication   . Diabetic peripheral neuropathy   . Eczema   . GERD (gastroesophageal reflux disease)   . H/O hiatal hernia   . Headache(784.0)    allergies, sinus  . Hyperlipidemia   . Hypertension   . Myocardial infarction (HCC)   . PONV (postoperative nausea and vomiting)   . Shortness of breath   . Sleep apnea   . Umbilical hernia      Assessment: 65 YOF transferred from [facility] with acute CHF and NSTEMI.  Troponin elevated at 3.44.  It appears that patient was started on Lovenox 80mg  SQ Q12H based on the [facility] that was sent with the patient, and last dose appears to be at 1000 today.  Per documentation, patient's weight was increasing.  Patient reports that she weighs 93 kg, which doesn't correlate with the Lovenox dosing.  Called [facility] and was told RN could not access patient's chart to obtain last  dose nor dry weight.    Patient has some renal dysfunction but CrCL remains greater than 30 ml/min.  Noted that she received Lovenox 40mg  SQ already tonight.  No bleeding reported.   Goal of Therapy:  Anti-Xa level 0.6-1 units/ml 4hrs after LMWH dose given Monitor platelets by anticoagulation protocol: Yes    Plan:  Lovenox 40mg  IV x 1, then 80mg  SQ Q12H starting tomorrow CBC Q72H Watch SCr, weight   Jamare Vanatta D. Laney Potash, PharmD, BCPS Pager:  [phone] 08/02/2017, 10:30 PM

## 2017-08-02 NOTE — Progress Notes (Signed)
Patient now stating that she wants to turn off her insulin pump and for staff to cover her blood sugars with sliding scale insulin. Dr Onalee Hua notified. Per MD if patient turns off her insulin pump just write a note.

## 2017-08-02 NOTE — Progress Notes (Signed)
Patient arrived from [facility].  Patient slid over in bed by staff due to shortness of breath.  Patient on 50% venti mask at 15L with oxygen saturation 95%.  Rapid response at bedside to assess patient initially.  Hospitalist admissions paged and made aware of patients arrival.  Patient is alert and oriented x4.  Patient educated on call bell, phone, and room.  Will continue to monitor.

## 2017-08-02 NOTE — Progress Notes (Signed)
Patient turned insulin pump off and disconnected herself.

## 2017-08-02 NOTE — H&P (Signed)
History and Physical    Kiara [name] BJY:[id number] DOB: [birth year] DOA: 08/02/2017  PCP: Majel Homer, MD  Patient coming from:  [facility] hospital  Chief Complaint:   sob  HPI: Kiara [name] is a 67 y.o. female with medical history significant of CAD s/p CABG in 2014 here at [facility] following cardiologist in [place], DM on insulin pump, amputation was hospitalized on 12/28 at [facility] for acute mastoiditis due to pseudomonas, has been on cipro ear drops and cipro iv since and cipro iv changed to zosyn iv yesterday for unclear reason.  She feels her ear is improved.  However since her admission, she has been becoming more sob and has gained significant weight up to 221 lbs from 173 lbs.  She denies any cough.  No chest pain.  Swelling diffusely and starting to get blisters to her left leg from the swelling.  She has no h/o CHF in her past.  She was put on lasix 40mg  iv q 12 hours 2 days ago and has not improved.  Her cardiologist there was called who advised outpt cath.  She and her family were  Not happy with this and requested transfer to [facility].  Her trop went up to 1.4 and bnp was 115000.  She also had an echo there which showed no wall motion at the apex with EF in the 30s.  Pt transferred here after discussion with our cardiology team for cardiac evaluation.  Review of Systems: As per HPI otherwise 10 point review of systems negative.   Past Medical History:  Diagnosis Date  . Anxiety   . Arthritis   . Coronary artery disease   . Diabetes mellitus without complication   . Diabetic peripheral neuropathy   . Eczema   . GERD (gastroesophageal reflux disease)   . H/O hiatal hernia   . Headache(784.0)    allergies, sinus  . Hyperlipidemia   . Hypertension   . Myocardial infarction (HCC)   . PONV (postoperative nausea and vomiting)   . Shortness of breath   . Sleep apnea   . Umbilical hernia     Past Surgical History:  Procedure Laterality Date  . ABDOMINAL HYSTERECTOMY    . amputation of  right 4th and 5th toe    . CARDIAC CATHETERIZATION    . CARPAL TUNNEL RELEASE    . CESAREAN SECTION  1983  . CORONARY ARTERY BYPASS GRAFT N/A 04/11/2013   Procedure: CORONARY ARTERY BYPASS GRAFTING (CABG);  Surgeon: Alleen Borne, MD;  Location: [facility] OR;  Service: Open Heart Surgery;  Laterality: N/A;  . EYE SURGERY Bilateral    laser  . FRACTURE SURGERY Left 2005     reports that she quit smoking about 7 years ago. Her smoking use included cigarettes. She quit after 25.00 years of use. she has never used smokeless tobacco. She reports that she does not drink alcohol or use drugs.  Allergies  Allergen Reactions  . Bactrim [Sulfamethoxazole-Trimethoprim] Rash  . Codeine Nausea And Vomiting  . Mobic [Meloxicam]     swelling  . Morphine And Related Nausea Only and Other (See Comments)    confusion  . Silvadene [Silver Sulfadiazine]     Red hot rash and blisters  . Adhesive [Tape] Rash    Band aid brand  laxex  . Sulfamethoxazole-Trimethoprim Rash    Family History  Problem Relation Age of Onset  . Heart attack Mother   . Heart disease Mother   . Hypertension Mother   . Heart attack Brother   .  Heart disease Brother   . Hypertension Brother     Prior to Admission medications   Medication Sig Start Date End Date Taking? Authorizing Provider  albuterol (PROAIR HFA) 108 (90 BASE) MCG/ACT inhaler Inhale 2 puffs into the lungs every 4 (four) hours as needed.  04/16/13  Yes [provider]  aspirin EC 81 MG tablet Take 81 mg by mouth daily.    Yes [provider]  atorvastatin (LIPITOR) 80 MG tablet Take 80 mg by mouth daily.  06/25/17  Yes [provider]  benazepril (LOTENSIN) 10 MG tablet TAKE 1 TABLET EVERY DAY Patient taking differently: TAKE 10 mg TABLET EVERY DAY 10/30/14  Yes Laqueta Linden, MD  Cholecalciferol (VITAMIN D) 2000 units tablet Take 2,000 Units by mouth daily.   Yes [provider]  clonazePAM (KLONOPIN) 1 MG tablet Take  0.5 tablets (0.5 mg total) by mouth at bedtime. Patient taking differently: Take 1 mg by mouth at bedtime.  05/27/13  Yes Laqueta Linden, MD  gabapentin (NEURONTIN) 800 MG tablet Take 800 mg by mouth 3 (three) times daily.  07/29/13  Yes [provider]  levocetirizine (XYZAL) 5 MG tablet Take 5 mg by mouth daily. TAKE 1 TABLET EVERY DAY (NEEDS APPOINTMENT AND LABS) 07/21/13  Yes [provider]  metoprolol tartrate (LOPRESSOR) 25 MG tablet TAKE 1/2 TABLET TWICE DAILY Patient taking differently: TAKE 12.5 mg TABLET TWICE DAILY 07/28/14  Yes Laqueta Linden, MD  PARoxetine (PAXIL) 20 MG tablet Take 20 mg by mouth at bedtime.    Yes [provider]  polyethylene glycol (MIRALAX / GLYCOLAX) packet Take 17 g by mouth daily as needed.  04/13/13  Yes [provider]  Probiotic Product (PROBIOTIC-10 PO) Take 1 tablet by mouth daily.   Yes [provider]  ranitidine (ZANTAC) 300 MG tablet Take 300 mg by mouth daily. TAKE 1 TABLET EVERY DAY 07/21/13  Yes [provider]  insulin aspart (NOVOLOG) 100 UNIT/ML injection 1.25 Units 12 Am-7 Am,  1.75 u/hr 7 AM  Til 12 AM, Bolus 1 unit for every 15 carbs, 1 Unit for every 50 points greater than 150 Insulin pump 11/12/13   [provider]    Physical Exam: Vitals:   08/02/17 1834  BP: 132/60  Pulse: 91  Resp: 20  Temp: 98.4 F (36.9 C)  TempSrc: Oral  SpO2: 97%      Constitutional: NAD, calm, comfortable able to speak in full sentences Vitals:   08/02/17 1834  BP: 132/60  Pulse: 91  Resp: 20  Temp: 98.4 F (36.9 C)  TempSrc: Oral  SpO2: 97%   Eyes: PERRL, lids and conjunctivae normal ENMT: Mucous membranes are moist. Posterior pharynx clear of any exudate or lesions.Normal dentition.  Neck: normal, supple, no masses, no thyromegaly Respiratory: clear to auscultation bilaterally, no wheezing, no crackles. Normal respiratory effort. No accessory muscle use.    Cardiovascular: Regular rate and rhythm, no murmurs / rubs / gallops. 3+ extremity edema. 2+ pedal pulses. No carotid bruits.  Abdomen: no tenderness, no masses palpated. No hepatosplenomegaly. Bowel sounds positive.  Musculoskeletal: no clubbing / cyanosis. No joint deformity upper and lower extremities. Good ROM, no contractures. Normal muscle tone.  Skin: no rashes, lesions, ulcers. No induration, blister to left leg Neurologic: CN 2-12 grossly intact. Sensation intact, DTR normal. Strength 5/5 in all 4.  Psychiatric: Normal judgment and insight. Alert and oriented x 3. Normal mood.    Labs on Admission: I have personally reviewed  following labs and imaging studies  CBC: Recent Labs  Lab 08/02/17 1929  WBC 12.8*  NEUTROABS 8.9*  HGB 9.9*  HCT 31.8*  MCV 95.2  PLT 449*   Basic Metabolic Panel: No results for input(s): NA, K, CL, CO2, GLUCOSE, BUN, CREATININE, CALCIUM, MG, PHOS in the last 168 hours. GFR: CrCl cannot be calculated (Patient's most recent lab result is older than the maximum 21 days allowed.). Liver Function Tests: No results for input(s): AST, ALT, ALKPHOS, BILITOT, PROT, ALBUMIN in the last 168 hours. No results for input(s): LIPASE, AMYLASE in the last 168 hours. No results for input(s): AMMONIA in the last 168 hours. Coagulation Profile: No results for input(s): INR, PROTIME in the last 168 hours. Cardiac Enzymes: No results for input(s): CKTOTAL, CKMB, CKMBINDEX, TROPONINI in the last 168 hours. BNP (last 3 results) No results for input(s): PROBNP in the last 8760 hours. HbA1C: No results for input(s): HGBA1C in the last 72 hours. CBG: Recent Labs  Lab 08/02/17 1839  GLUCAP 112*   Lipid Profile: No results for input(s): CHOL, HDL, LDLCALC, TRIG, CHOLHDL, LDLDIRECT in the last 72 hours. Thyroid Function Tests: No results for input(s): TSH, T4TOTAL, FREET4, T3FREE, THYROIDAB in the last 72 hours. Anemia Panel: No results for input(s): VITAMINB12,  FOLATE, FERRITIN, TIBC, IRON, RETICCTPCT in the last 72 hours. Urine analysis:    Component Value Date/Time   COLORURINE YELLOW 04/09/2013 1516   APPEARANCEUR CLEAR 04/09/2013 1516   LABSPEC 1.006 04/09/2013 1516   PHURINE 6.0 04/09/2013 1516   GLUCOSEU NEGATIVE 04/09/2013 1516   HGBUR NEGATIVE 04/09/2013 1516   BILIRUBINUR NEGATIVE 04/09/2013 1516   KETONESUR NEGATIVE 04/09/2013 1516   PROTEINUR NEGATIVE 04/09/2013 1516   UROBILINOGEN 0.2 04/09/2013 1516   NITRITE NEGATIVE 04/09/2013 1516   LEUKOCYTESUR TRACE (A) 04/09/2013 1516   Sepsis Labs: !!!!!!!!!!!!!!!!!!!!!!!!!!!!!!!!!!!!!!!!!!!! @LABRCNTIP (procalcitonin:4,lacticidven:4) )No results found for this or any previous visit (from the past 240 hour(s)).   Radiological Exams on Admission: No results found.  Chart reviewed from [place] hospitalization Old chart here also reviewed Case discussed with rn  Assessment/Plan 67 yo female h/o CABG comes in with new onset chf and nstemi  Principal Problem:   Acute CHF (congestive heart failure) (HCC)- increase lasix to 60mg  iv q 12.  Notify cards of pt arrival.  Ck stat trop, bnp, cbc, cmp.  Ck 12 lead ekg.  Will not repeat echo, CD on chart.  Ck cxr now.  Supplemental oxygen prn.  Active Problems:   NSTEMI (non-ST elevated myocardial infarction) (HCC)- ck trop now.  Notify cards ofpt arival.  aspirin   Acute respiratory failure with hypoxia (HCC)- due to edema, lasix   Type 1 diabetes (HCC)- cont pump at her 1.7 unit/hour basal rate.  Place on ssi sensitive   Essential hypertension- clarify home meds   Anxiety- hold benzos right now due to hypoxia   Coronary atherosclerosis due to severely calcified coronary lesion- noted   PVD (peripheral vascular disease) (HCC)- stable   S/P CABG x 2- noted   OM/mastoiditits on right - cont cipro drops and zosyn   med rec pending   DVT prophylaxis:   lovenox Code Status:  full Family Communication:  son Disposition Plan:  Per day  team Consults called:  cardiology Admission status:  admission   Huel Centola A MD [facility]  If 7PM-7AM, please contact night-coverage [email or web address] Password [email or web address]  08/02/2017, 8:25 PM

## 2017-08-02 NOTE — Progress Notes (Signed)
Pharmacy Antibiotic Note  Kiara [name] is a 67 y.o. female admitted on 08/02/2017 to [facility] with ear pain.   Patient has a history of chronic otitis media s/p recent ear wick removal.  She was on Cipro before transfer.  Patient transfer to [facility] for further management of acute CHF.  Pharmacy has been consulted to transition to Zosyn.  Baseline labs reviewed.   Plan: Zosyn EID 3.375gm IV Q8H Monitor renal fxn, clinical progress, abx LOT   Height: 5\' 3"  (160 cm) Weight: 205 lb (93 kg) IBW/kg (Calculated) : 52.4  Temp (24hrs), Avg:98 F (36.7 C), Min:97.5 F (36.4 C), Max:98.4 F (36.9 C)  Recent Labs  Lab 08/02/17 1929  WBC 12.8*    CrCl cannot be calculated (Patient's most recent lab result is older than the maximum 21 days allowed.).    Allergies  Allergen Reactions  . Bactrim [Sulfamethoxazole-Trimethoprim] Rash  . Codeine Nausea And Vomiting  . Mobic [Meloxicam]     swelling  . Morphine And Related Nausea Only and Other (See Comments)    confusion  . Silvadene [Silver Sulfadiazine]     Red hot rash and blisters  . Adhesive [Tape] Rash    Band aid brand  laxex  . Sulfamethoxazole-Trimethoprim Rash     Azith 12/28 > 12/28 CTX 12/28 > 12/28 Cipro 12/28 PTA Zosyn 1/3 >>   Kiara [name] D. Laney Potash, PharmD, BCPS Pager:  [phone] 08/02/2017, 10:04 PM

## 2017-08-02 NOTE — Progress Notes (Signed)
Patient troponin elevated 3.44 and ekg reads STand T wave abnormality consider lateral ischemia. No complaints of chest pain.Dr Onalee Hua notified.

## 2017-08-02 NOTE — Progress Notes (Signed)
Dr. Tiburcio Pea notified of elevated troponin and ekg results.MD to come see patient.

## 2017-08-03 ENCOUNTER — Other Ambulatory Visit: Payer: Self-pay

## 2017-08-03 DIAGNOSIS — I5043 Acute on chronic combined systolic (congestive) and diastolic (congestive) heart failure: Secondary | ICD-10-CM

## 2017-08-03 DIAGNOSIS — I214 Non-ST elevation (NSTEMI) myocardial infarction: Principal | ICD-10-CM

## 2017-08-03 DIAGNOSIS — I10 Essential (primary) hypertension: Secondary | ICD-10-CM

## 2017-08-03 DIAGNOSIS — Z951 Presence of aortocoronary bypass graft: Secondary | ICD-10-CM

## 2017-08-03 DIAGNOSIS — I5021 Acute systolic (congestive) heart failure: Secondary | ICD-10-CM

## 2017-08-03 DIAGNOSIS — I2584 Coronary atherosclerosis due to calcified coronary lesion: Secondary | ICD-10-CM

## 2017-08-03 DIAGNOSIS — J9601 Acute respiratory failure with hypoxia: Secondary | ICD-10-CM

## 2017-08-03 LAB — GLUCOSE, CAPILLARY
GLUCOSE-CAPILLARY: 162 mg/dL — AB (ref 65–99)
GLUCOSE-CAPILLARY: 447 mg/dL — AB (ref 65–99)
Glucose-Capillary: 100 mg/dL — ABNORMAL HIGH (ref 65–99)
Glucose-Capillary: 206 mg/dL — ABNORMAL HIGH (ref 65–99)
Glucose-Capillary: 268 mg/dL — ABNORMAL HIGH (ref 65–99)
Glucose-Capillary: 458 mg/dL — ABNORMAL HIGH (ref 65–99)

## 2017-08-03 LAB — BASIC METABOLIC PANEL
Anion gap: 7 (ref 5–15)
BUN: 31 mg/dL — AB (ref 6–20)
CALCIUM: 8.3 mg/dL — AB (ref 8.9–10.3)
CO2: 28 mmol/L (ref 22–32)
Chloride: 101 mmol/L (ref 101–111)
Creatinine, Ser: 1.6 mg/dL — ABNORMAL HIGH (ref 0.44–1.00)
GFR calc Af Amer: 38 mL/min — ABNORMAL LOW (ref >60)
GFR, EST NON AFRICAN AMERICAN: 33 mL/min — AB (ref >60)
GLUCOSE: 129 mg/dL — AB (ref 65–99)
Potassium: 4.6 mmol/L (ref 3.5–5.1)
Sodium: 136 mmol/L (ref 135–145)

## 2017-08-03 LAB — PROTIME-INR
INR: 1.21
Prothrombin Time: 15.2 seconds (ref 11.4–15.2)

## 2017-08-03 LAB — TROPONIN I
TROPONIN I: 1.78 ng/mL — AB (ref <0.03)
Troponin I: 2.68 ng/mL (ref <0.03)

## 2017-08-03 LAB — HEPARIN LEVEL (UNFRACTIONATED)
Heparin Unfractionated: 1.22 IU/mL — ABNORMAL HIGH (ref 0.30–0.70)
Heparin Unfractionated: 1 IU/mL — ABNORMAL HIGH (ref 0.30–0.70)

## 2017-08-03 MED ORDER — HYDROCORTISONE 1 % EX CREA
TOPICAL_CREAM | Freq: Three times a day (TID) | CUTANEOUS | Status: DC | PRN
  Filled 2017-08-03: qty 28

## 2017-08-03 MED ORDER — HEPARIN (PORCINE) IN NACL 100-0.45 UNIT/ML-% IJ SOLN
1050.0000 [IU]/h | INTRAMUSCULAR | Status: DC
  Administered 2017-08-03 – 2017-08-04 (×2): 550 [IU]/h via INTRAVENOUS
  Administered 2017-08-06: 850 [IU]/h via INTRAVENOUS
  Filled 2017-08-03 (×3): qty 250

## 2017-08-03 MED ORDER — INSULIN ASPART 100 UNIT/ML ~~LOC~~ SOLN
3.0000 [IU] | Freq: Three times a day (TID) | SUBCUTANEOUS | Status: DC
  Administered 2017-08-03 – 2017-08-05 (×5): 3 [IU] via SUBCUTANEOUS

## 2017-08-03 MED ORDER — HEPARIN BOLUS VIA INFUSION
2000.0000 [IU] | Freq: Once | INTRAVENOUS | Status: AC
  Administered 2017-08-03: 2000 [IU] via INTRAVENOUS
  Filled 2017-08-03: qty 2000

## 2017-08-03 MED ORDER — ORAL CARE MOUTH RINSE
15.0000 mL | Freq: Two times a day (BID) | OROMUCOSAL | Status: DC
  Administered 2017-08-03 – 2017-08-09 (×11): 15 mL via OROMUCOSAL

## 2017-08-03 MED ORDER — INSULIN ASPART 100 UNIT/ML ~~LOC~~ SOLN
13.0000 [IU] | Freq: Once | SUBCUTANEOUS | Status: AC
  Administered 2017-08-03: 13 [IU] via SUBCUTANEOUS

## 2017-08-03 MED ORDER — INSULIN DETEMIR 100 UNIT/ML ~~LOC~~ SOLN
10.0000 [IU] | Freq: Two times a day (BID) | SUBCUTANEOUS | Status: DC
  Administered 2017-08-03 – 2017-08-05 (×5): 10 [IU] via SUBCUTANEOUS
  Filled 2017-08-03 (×8): qty 0.1

## 2017-08-03 MED ORDER — HEPARIN (PORCINE) IN NACL 100-0.45 UNIT/ML-% IJ SOLN
800.0000 [IU]/h | INTRAMUSCULAR | Status: DC
  Administered 2017-08-03: 900 [IU]/h via INTRAVENOUS
  Filled 2017-08-03: qty 250

## 2017-08-03 NOTE — Progress Notes (Signed)
ANTICOAGULATION CONSULT NOTE - Follow Up Consult  Pharmacy Consult for Heparin Indication: chest pain/ACS  Allergies  Allergen Reactions  . Bactrim [Sulfamethoxazole-Trimethoprim] Rash  . Codeine Nausea And Vomiting  . Mobic [Meloxicam]     swelling  . Morphine And Related Nausea Only and Other (See Comments)    confusion  . Silvadene [Silver Sulfadiazine]     Red hot rash and blisters  . Adhesive [Tape] Rash    Band aid brand  laxex  . Sulfamethoxazole-Trimethoprim Rash    Patient Measurements: Height: 5\' 3"  (160 cm) Weight: 203 lb 0.7 oz (92.1 kg) IBW/kg (Calculated) : 52.4 Heparin Dosing Weight:  74 kg  Vital Signs: Temp: 98.7 F (37.1 C) (01/04 0750) Temp Source: Oral (01/04 0750) BP: 110/75 (01/04 0750) Pulse Rate: 77 (01/04 0750)  Labs: Recent Labs    08/02/17 1929 08/03/17 0540 08/03/17 1118  HGB 9.9*  --   --   HCT 31.8*  --   --   PLT 449*  --   --   LABPROT  --   --  15.2  INR  --   --  1.21  HEPARINUNFRC  --   --  1.22*  CREATININE 1.56* 1.60*  --   TROPONINI 3.44*  --  2.68*    Estimated Creatinine Clearance: 38.3 mL/min (A) (by C-G formula based on SCr of 1.6 mg/dL (H)).  Assessment:  Anticoag: Lovenox>>heparin NSTEMI>IV heparin. HL 1.22 unexpectedly elevated. No CBC today. Last Hgb 99. Plts 449. - LMWH 40mg  on 1/3 at 2050.  Goal of Therapy:  Heparin level 0.3-0.7 units/ml Monitor platelets by anticoagulation protocol: Yes   Plan:  Decrease IV heparin to 800 units/hr and recheck level in 6 hrs. Daily HL and cBC  Carnesha Maravilla S. Merilynn Finland, PharmD, BCPS Clinical Staff Pharmacist Pager [phone]  Misty Stanley Stillinger 08/03/2017,1:29 PM

## 2017-08-03 NOTE — Care Management Note (Signed)
Case Management Note  Patient Details  Name: Kiara [name] MRN: [id number] Date of Birth: 11/08/1950  Subjective/Objective:   Admitted with congestive heart failure.          Action/Plan:  In to speak with patient, prior to admission patient lived at home with spouse and mother.  PCP is Majel Homer.  Uses [facility] in [place], [place] for short-term medications only.  Home DME: CPAP.  Patient stated she has a scale but does not weigh daily; RN advised the importance of weighing.  Patient does her own cooking and does not eat salt.  Denies inability to afford medication or food.  Has used [facility] [employer]; does not mind using them again if needed post discharge.  NCM will continue to follow for discharge needs.             Expected Discharge Plan:  Home/Self Care  Discharge planning Services  CM Consult   Status of Service:  In process, will continue to follow  Cloyde Reams, BSN, RN Case Manager-Orientation [phone] 08/03/2017, 3:04 PM

## 2017-08-03 NOTE — Progress Notes (Addendum)
Inpatient Diabetes Program Recommendations  AACE/ADA: New Consensus Statement on Inpatient Glycemic Control (2015)  Target Ranges:  Prepandial:   less than 140 mg/dL      Peak postprandial:   less than 180 mg/dL (1-2 hours)      Critically ill patients:  140 - 180 mg/dL   Lab Results  Component Value Date   GLUCAP 100 (H) 08/03/2017   HGBA1C 8.7 (H) 04/09/2013    Review of Glycemic ControlResults for ARAH, ARO (MRN [id number]) as of 08/03/2017 10:00  Ref. Range 08/02/2017 18:39 08/02/2017 20:32 08/03/2017 00:03 08/03/2017 04:14 08/03/2017 07:30  Glucose-Capillary Latest Ref Range: 65 - 99 mg/dL 112 (H) 80 268 (H) 162 (H) 100 (H)   Diabetes history: Type 1 DM Outpatient Diabetes medications:  Insulin pump: Medtronic 12-7a- 1.25 units/hr 7a-12 MN-1. 75 units/hr 1 unit/15 grams CHO 1 unit for every 50 mg/dL greater than 150 mg/dL Current orders for Inpatient glycemic control:  Novolog sensitive q 4 hours  Inpatient Diabetes Program Recommendations:    Note that patient has insulin pump.  Overnight patient stated that she does not feel like managing her pump in the hospital and thus removed it.  Blood sugars currently okay, however patient was receiving almost 40 units of basal insulin per 24 hours.  Text page sent to Dr. Wyline Copas.  May consider adding Levemir 10 units bid (hold if blood sugar less than 100 mg/dL).    Will see patient.   Thanks,  Adah Perl, RN, BC-ADM Inpatient Diabetes Coordinator Pager [phone] (8a-5p)  Addendum:  Met with patient and MD.  Patient states that she does not feel like managing her insulin pump while in the hospital.  We discussed insulin pump settings with MD.  Orders received for Levemir 10 units bid and and Novolog meal coverage 3 units tid with meals (hold if patient eats less than 50%).

## 2017-08-03 NOTE — Progress Notes (Signed)
Capillary glucose 458, NP paged.

## 2017-08-03 NOTE — Progress Notes (Signed)
Progress Note  Patient Name: Kiara [name] Date of Encounter: 08/03/2017  Primary Cardiologist: Octavio Manns Pur[place] '14)   Subjective   Breathing is better but still dyspneic while at rest.   Inpatient Medications    Scheduled Meds: . aspirin EC  81 mg Oral Daily  . atorvastatin  80 mg Oral q1800  . ciprofloxacin-dexamethasone  4 drop Right EAR BID  . furosemide  60 mg Intravenous Q12H  . insulin aspart  0-9 Units Subcutaneous Q4H  . insulin pump   Subcutaneous TID AC, HS, 0200  . mouth rinse  15 mL Mouth Rinse BID  . metoprolol tartrate  12.5 mg Oral BID  . sodium chloride flush  3 mL Intravenous Q12H   Continuous Infusions: . sodium chloride    . heparin 900 Units/hr (08/03/17 0140)  . piperacillin-tazobactam (ZOSYN)  IV 3.375 g (08/03/17 0550)   PRN Meds: sodium chloride, acetaminophen, ondansetron (ZOFRAN) IV, sodium chloride flush   Vital Signs    Vitals:   08/02/17 2100 08/02/17 2307 08/03/17 0417 08/03/17 0750  BP:  (!) 130/45 (!) 113/49 110/75  Pulse:  92 78 77  Resp:   18 18  Temp:   98 F (36.7 C) 98.7 F (37.1 C)  TempSrc:   Oral Oral  SpO2:   96% 97%  Weight: 205 lb (93 kg)  203 lb 0.7 oz (92.1 kg)   Height: 5\' 3"  (1.6 m)       Intake/Output Summary (Last 24 hours) at 08/03/2017 0912 Last data filed at 08/03/2017 0856 Gross per 24 hour  Intake 159 ml  Output 500 ml  Net -341 ml   Filed Weights   08/02/17 2100 08/03/17 0417  Weight: 205 lb (93 kg) 203 lb 0.7 oz (92.1 kg)    Telemetry    SR - Personally Reviewed  ECG    SR with ST changes in inferolateral leads - Personally Reviewed  Physical Exam   General: Obese older W female appearing in no acute distress, but dyspneic at rest. Head: Normocephalic, atraumatic.  Neck: Supple without bruits, mild JVD. Lungs:  Resp regular and unlabored, Diminished with crackles in lower lobes. Heart: RRR, distant heart sounds, S1, S2, no S3, S4, or murmur; no rub. Abdomen: Soft, non-tender, but  firm.  Non-distended with normoactive bowel sounds. No hepatomegaly. No rebound/guarding. No obvious abdominal masses. Extremities: No clubbing, cyanosis, 2+ pitting edema to thighs -wearing support hose. Distal pedal pulses are 2+ bilaterally. Neuro: Alert and oriented X 3. Moves all extremities spontaneously. Psych: Normal affect.  Labs    Chemistry Recent Labs  Lab 08/02/17 1929 08/03/17 0540  NA 134* 136  K 4.9 4.6  CL 99* 101  CO2 25 28  GLUCOSE 102* 129*  BUN 30* 31*  CREATININE 1.56* 1.60*  CALCIUM 8.5* 8.3*  PROT 6.5  --   ALBUMIN 2.5*  --   AST 34  --   ALT 33  --   ALKPHOS 139*  --   BILITOT 0.5  --   GFRNONAA 34* 33*  GFRAA 39* 38*  ANIONGAP 10 7     Hematology Recent Labs  Lab 08/02/17 1929  WBC 12.8*  RBC 3.34*  HGB 9.9*  HCT 31.8*  MCV 95.2  MCH 29.6  MCHC 31.1  RDW 15.7*  PLT 449*    Cardiac Enzymes Recent Labs  Lab 08/02/17 1929  TROPONINI 3.44*   No results for input(s): TROPIPOC in the last 168 hours.   BNP Recent Labs  Lab 08/02/17  1929  BNP 1,347.3*     DDimer No results for input(s): DDIMER in the last 168 hours.    Radiology    Dg Chest 2 View  Result Date: 08/02/2017 CLINICAL DATA:  Shortness of breath. EXAM: CHEST  2 VIEW COMPARISON:  Radiographs and CT yesterday. FINDINGS: Post median sternotomy. Again seen cardiomegaly. Bilateral pleural effusions and bibasilar airspace disease, similar to prior exam. Worsening vascular congestion and perihilar aeration. No pneumothorax. IMPRESSION: Worsening CHF with increasing vascular congestion and worsening perihilar aeration. Bilateral pleural effusions and bibasilar airspace disease appears similar. Electronically Signed   By: Rubye Oaks M.D.   On: 08/02/2017 22:47    Cardiac Studies   TTE: [facility]) Report in chart. EF 30-35%  Patient Profile     67 y.o. female with PMH of CAD s/p CABG (SVG-PDA, LIMA-LAD) '14, HTN, HL, DM, OSA on Cpap who presented to [facility] with ear pain and mastoiditis. Developed acute HF and positive troponins.    Assessment & Plan    1. Acute on Chronic combined systolic and diastolic HF: Denies any shortness of breath or chest pain prior to her admission to [facility]. Presented there for Ear pain/mastoiditis and given IVFs. Then developed progressive dyspnea and weight gain/LE edema. Echo there reported EF of 30-35%, which is down from previously reported 40-45% back in 2014. Was started on IV lasix and now reports improvement in symptoms but still volume overloaded. Will need ischemic evaluation but remains significantly volume overloaded. BNP 1347 on admission, and CXR with bilateral pleural effusions. -- continue IV lasix -- would switch BB to Toprol given fall in EF, no room for ARB/ACEi given elevated Cr.  2. NSTEMI: Trop up to 3.4. No chest pain per her report. Remains on IV heparin. Will need cath but need to wait until she is more stable from a respiratory stand point.  -- trend enzymes -- continue IV heparin  3. Mastoiditis: Reports being treated by ENT since 3/18. Was referred to [facility] but presented to [facility] with worsening pain last week. Management per primary   4. HTN: Stable with current therapy  5. HL: on statin  6. AKI: 1.5>>1.6. Follow BMET with diuresis.  Signed, Laverda Page, NP  08/03/2017, 9:12 AM  Pager # [phone]   For questions or updates, please contact [facility] Please consult [email or web address] for contact info under Cardiology/STEMI.   I have seen, examined and evaluated the patient this AM along with Laverda Page, NP-C.  After reviewing all the available data and chart, we discussed the patients laboratory, study & physical findings as well as symptoms in detail. I agree with her findings, examination as well as impression recommendations as per our discussion.    Attending adjustments noted in italics.   Complicated situation with the patient with known  coronary disease and chronic combined systolic and diastolic heart failure who is transferred from an outside hospital after he presented there with symptoms of mastoiditis.  She was treated aggressively with hydration and antibiotics and subsequently developed CHF exacerbation and now has positive troponins.  The question is which came first.  Most likely the troponin elevation is related to demand ischemia from the existing CAD that she has (ungrafted circumflex description).  In the absence of any anginal symptoms this is most likely.  However, cannot exclude ACS, because she did not did not have classic anginal symptoms in the past either. Plan for now would be to continue IV heparin throughout the weekend.  She  needs to be stabilized from heart failure standpoint with diuresis prior to being eligible for cardiac catheterization. --Diuresis over the weekend --Anticipate cardiac catheterization (probably right and left heart catheterization) on Monday pending resolution of CHF.  Continue beta-blocker.  Agree with holding ARB. Continue statin.  Treatment of mastoiditis per primary team    Bryan Lemma, M.D., M.S. Interventional Cardiologist   Pager # [phone] Phone # [phone] [address] [place], [place] [address]

## 2017-08-03 NOTE — Consult Note (Signed)
CARDIOLOGY CONSULT  Physician Requesting Consult:  Tarry Kos, MD  HPI:  Kiara [name] is a 67 y.o. old female with history of CAD s/p CABG in 2014, DM, mastoiditis presents with acute CHF and NSTEMI.  She orginally presented to the hospital in [place] with complaints of pain due to mastoiditis on 12/28.  While there she states she was given large amounts of fluids resulting in progressive shortness of breath and weight gain.  She denies chest pain during this time.  No infection symptoms.  During her work up troponins were measured and noted to be elevated at 1.4 and BNP of 115,000.  She was given lasix on Jan 2 but states she's had very little improvement until today when she feels she's increased urine output.  Per the outside records she had an ECHO with EF of 30-35%.  She requested transfer the [facility] for further management.  Of note she had no dyspnea prior to presenting to the outside hospital.    Assessment/Plan  Acute Congestive Heart Failure   Assessment:  Patient presents with profound volume overload resulting in dyspnea.  She has pitting edema to the mid thighs bilaterally.  Agree with aggressive diuresis as you are doing.  Would repeat ECHO.     Plan  -  Lasix 60 mg IV BID  -  Metoprolol 12.5 mg BID  -  Repeat ECHO  -  Monitor kidney function  -  Consider ACEi depending on how the kidney look after decongestion  NSTEMI   Assessment:  Patient with elevated cardiac enzymes on presentation with reduced EF on outside ECHO.  Denies chest pain while at the OSH or on presentation here.  She is quite short of breath which may be in part to excessive fluid administration vs new systolic dysfunction in the context of an ACS event.     Plan  -  Trend enzymes  -  ECHO as noted above  -  Agree with metoprool 12.5 mg BID  -  NPO after midnight for pending cath  Past Cardiovascular History:  +CAD +MI +CHF +PVD - No documented h/o AAA - No documented h/o valvular heart disease -  No documented h/o CVA - No documented h/o Arrhythmias - No documented h/o A-fib  - No documented h/o congenital heart disease +CABG - No documented h/o PCI - No documented h/o cardiac devices (Pacer/ICD/CRT) - No documented h/o cardiac surgery       Most recent stress test:  None  Most recent echocardiography:   01/30/13 EF 40-45% Elevated LV filling pressures Mild mitral annular calcification  Most recent left heart catheterization:   02/03/13 Severe triple vessel disease Moderate to severely elevated LVEDP  CABG: 04/11/13 LIMA-LAD SVG-PDA  Device history:  None  Past Medical History:  Diagnosis Date  . Anxiety   . Arthritis   . Coronary artery disease   . Diabetes mellitus without complication   . Diabetic peripheral neuropathy   . Eczema   . GERD (gastroesophageal reflux disease)   . H/O hiatal hernia   . Headache(784.0)    allergies, sinus  . Hyperlipidemia   . Hypertension   . Myocardial infarction (HCC)   . PONV (postoperative nausea and vomiting)   . Shortness of breath   . Sleep apnea   . Umbilical hernia     Past Surgical History:  Procedure Laterality Date  . ABDOMINAL HYSTERECTOMY    . 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 CARDIAC CATHETERIZATION    .  CARPAL TUNNEL RELEASE    . CESAREAN SECTION  1983  . CORONARY ARTERY BYPASS GRAFT N/A 04/11/2013   Procedure: CORONARY ARTERY BYPASS GRAFTING (CABG);  Surgeon: Alleen Borne, MD;  Location: [facility] OR;  Service: Open Heart Surgery;  Laterality: N/A;  . EYE SURGERY Bilateral    laser  . FRACTURE SURGERY Left 2005    Social History   Socioeconomic History  . Marital status: Married    Spouse name: Not on file  . Number of children: Not on file  . Years of education: Not on file  . Highest education level: Not on file  Social Needs  . Financial resource strain: Not on file  . Food insecurity - worry: Not on file  . Food insecurity - inability: Not on file  . Transportation needs -  medical: Not on file  . Transportation needs - non-medical: Not on file  Occupational History  . Not on file  Tobacco Use  . Smoking status: Former Smoker    Years: 25.00    Types: Cigarettes    Last attempt to quit: 05/30/2010    Years since quitting: 7.1  . Smokeless tobacco: Never Used  . Tobacco comment: STOPPED 2007  Substance and Sexual Activity  . Alcohol use: No  . Drug use: No  . Sexual activity: Not on file  Other Topics Concern  . Not on file  Social History Narrative  . Not on file    Family History  Problem Relation Age of Onset  . Heart attack Mother   . Heart disease Mother   . Hypertension Mother   . Heart attack Brother   . Heart disease Brother   . Hypertension Brother     No intake or output data in the 24 hours ending 08/03/17 0121  MEDS:  sodium chloride   heparin   piperacillin-tazobactam (ZOSYN)  IV Last Rate: 3.375 g (08/02/17 2329)    aspirin EC 81 mg Daily  atorvastatin 80 mg q1800  ciprofloxacin-dexamethasone 4 drop BID  furosemide 60 mg Q12H  heparin 2,000 Units Once  insulin aspart 0-9 Units Q4H  insulin pump  TID AC, HS, 0200  metoprolol tartrate 12.5 mg BID  sodium chloride flush 3 mL Q12H    Review of Systems:  GEN: no fever, chills, nausea, vomiting, weight change  HEENT: no vision or hearing changes  PULM: +coughing, +SOB  CV: no chest pain, palpitations, PND, orthopnea  GI: no abdominal pain  GU: no dysuria  EXT: +swelling  SKIN: no rashes  NEURO: no numbness or tingling  HEME: no bleeding or +bruising  GYN: none  --12 point review systems- otherwise negative.  Physical Examination: Blood pressure (!) 130/45, pulse 92, temperature (!) 97.5 F (36.4 C), temperature source Oral, resp. rate 18, height 5\' 3"  (1.6 m), weight 93 kg (205 lb), SpO2 97 %. General:  AAOX 4.  NAD.  NRD. Obese HENT: Normocephalic. Atraumatic.  No acute abnom. EYES: PERRL EOMI  Neck: Supple.  +JVD.  No bruits. Cardiovascular:  Distant heart  sounds.  Nl S1. Nl S2.  Pulmonary/Chest: Diminished breath sounds bilateral.  Wheezing present throughout the lung fields. Abdomen: Firm and distended, no masses, no organomegaly. Neuro: CN intact, no motor/sensory deficit.  Ext: Warm. +edema to the mid thighs. SKIN:  Scattered brusing  Recent Labs    08/02/17 1929  HGB 9.9*  HCT 31.8*  WBC 12.8*  BUN 30*  CREATININE 1.56*  GLUCOSE 102*  CALCIUM 8.5*  BNP 1,347.3*  Discuss the benefits and adverse side affects of the medications use.  Discuss the benefits and adverse side affects of the required study.  Discuss the risk and benefits of ambulation during hospitalization.   Link Snuffer, MD, PhD Cardiology

## 2017-08-03 NOTE — Progress Notes (Signed)
PROGRESS NOTE    Kiara [name]  ZOX:[id number] DOB: [birth year] DOA: 08/02/2017 PCP: Majel Homer, MD    Brief Narrative:  67 y.o. female with medical history significant of CAD s/p CABG in 2014 here at [facility] following cardiologist in [place], DM on insulin pump, amputation was hospitalized on 12/28 at [facility] for acute mastoiditis due to pseudomonas, has been on cipro ear drops and cipro iv since and cipro iv changed to zosyn iv yesterday for unclear reason.  She feels her ear is improved.  However since her admission, she has been becoming more sob and has gained significant weight up to 221 lbs from 173 lbs.  She denies any cough.  No chest pain.  Swelling diffusely and starting to get blisters to her left leg from the swelling.  She has no h/o CHF in her past.  She was put on lasix 40mg  iv q 12 hours 2 days ago and has not improved.  Her cardiologist there was called who advised outpt cath.  She and her family were  Not happy with this and requested transfer to [facility].  Her trop went up to 1.4 and bnp was 115000.  She also had an echo there which showed no wall motion at the apex with EF in the 30s.  Pt transferred here after discussion with our cardiology team for cardiac evaluation    Assessment & Plan:   Principal Problem:   Acute CHF (congestive heart failure) (HCC) Active Problems:   Type 1 diabetes (HCC)   Essential hypertension   Anxiety   Coronary atherosclerosis due to severely calcified coronary lesion   PVD (peripheral vascular disease) (HCC)   S/P CABG x 2   NSTEMI (non-ST elevated myocardial infarction) (HCC)   CHF (congestive heart failure) (HCC)   Acute respiratory failure with hypoxia (HCC)   SOB (shortness of breath)  Principal Problem:   Acute CHF (congestive heart failure) (HCC) -Patient is continued on lasix 60mg  iv q 12. -Cardiology following, recommendations for diuresis as tolerated -repeat bmet in AM -Still clinically volume overloaded on exam  Active  Problems:   NSTEMI (non-ST elevated myocardial infarction) (HCC) -Presently chest pain free -Cardiology recs noted. Suspect demand mismatch vs true ACS -Recommendation to diurese over weekend before patient is eligible for heart cath    Acute respiratory failure with hypoxia (HCC) -Likely due to edema -continue lasix as tolerated    Type 1 diabetes (HCC) -Continue on ssi sensitive -discussed with diabetic coordinator. Recs noted for basal and pre-meal coverage    Essential hypertension -Blood pressure currently stable -Continue with current regimen as tolerated    Anxiety -held benzos at time of admission due to hypoxia    Coronary atherosclerosis due to severely calcified coronary lesion -Per above, plan to correct volume status before considering heart cath    PVD (peripheral vascular disease) (HCC) - stable at this time    OM/mastoiditits on right  -Patient had been continued on cipro ear drops and zosyn   DVT prophylaxis: heparin infusion Code Status: Full Family Communication: Pt in room, family not at bedside Disposition Plan: Uncertain at this time  Consultants:   Cardiology  Procedures:     Antimicrobials: Anti-infectives (From admission, onward)   Start     Dose/Rate Route Frequency Ordered Stop   08/02/17 2230  piperacillin-tazobactam (ZOSYN) IVPB 3.375 g     3.375 g 12.5 mL/hr over 240 Minutes Intravenous Every 8 hours 08/02/17 2205         Subjective: Without complaints  at this time. Denies chest pain  Objective: Vitals:   08/02/17 2100 08/02/17 2307 08/03/17 0417 08/03/17 0750  BP:  (!) 130/45 (!) 113/49 110/75  Pulse:  92 78 77  Resp:   18 18  Temp:   98 F (36.7 C) 98.7 F (37.1 C)  TempSrc:   Oral Oral  SpO2:   96% 97%  Weight: 93 kg (205 lb)  92.1 kg (203 lb 0.7 oz)   Height: 5\' 3"  (1.6 m)       Intake/Output Summary (Last 24 hours) at 08/03/2017 1419 Last data filed at 08/03/2017 1240 Gross per 24 hour  Intake 159 ml  Output  1100 ml  Net -941 ml   Filed Weights   08/02/17 2100 08/03/17 0417  Weight: 93 kg (205 lb) 92.1 kg (203 lb 0.7 oz)    Examination:  General exam: Appears calm and comfortable  Respiratory system: Clear to auscultation. Respiratory effort normal. Cardiovascular system: S1 & S2 heard, RRR Gastrointestinal system: Abdomen is nondistended, soft and nontender. No organomegaly or masses felt. Normal bowel sounds heard. Central nervous system: Alert and oriented. No focal neurological deficits. Extremities: Symmetric 5 x 5 power. Skin: No rashes, lesions  Psychiatry: Judgement and insight appear normal. Mood & affect appropriate.   Data Reviewed: I have personally reviewed following labs and imaging studies  CBC: Recent Labs  Lab 08/02/17 1929  WBC 12.8*  NEUTROABS 8.9*  HGB 9.9*  HCT 31.8*  MCV 95.2  PLT 449*   Basic Metabolic Panel: Recent Labs  Lab 08/02/17 1929 08/03/17 0540  NA 134* 136  K 4.9 4.6  CL 99* 101  CO2 25 28  GLUCOSE 102* 129*  BUN 30* 31*  CREATININE 1.56* 1.60*  CALCIUM 8.5* 8.3*   GFR: Estimated Creatinine Clearance: 38.3 mL/min (A) (by C-G formula based on SCr of 1.6 mg/dL (H)). Liver Function Tests: Recent Labs  Lab 08/02/17 1929  AST 34  ALT 33  ALKPHOS 139*  BILITOT 0.5  PROT 6.5  ALBUMIN 2.5*   No results for input(s): LIPASE, AMYLASE in the last 168 hours. No results for input(s): AMMONIA in the last 168 hours. Coagulation Profile: Recent Labs  Lab 08/03/17 1118  INR 1.21   Cardiac Enzymes: Recent Labs  Lab 08/02/17 1929 08/03/17 1118  TROPONINI 3.44* 2.68*   BNP (last 3 results) No results for input(s): PROBNP in the last 8760 hours. HbA1C: No results for input(s): HGBA1C in the last 72 hours. CBG: Recent Labs  Lab 08/02/17 2032 08/03/17 0003 08/03/17 0414 08/03/17 0730 08/03/17 1112  GLUCAP 80 268* 162* 100* 206*   Lipid Profile: No results for input(s): CHOL, HDL, LDLCALC, TRIG, CHOLHDL, LDLDIRECT in the  last 72 hours. Thyroid Function Tests: No results for input(s): TSH, T4TOTAL, FREET4, T3FREE, THYROIDAB in the last 72 hours. Anemia Panel: No results for input(s): VITAMINB12, FOLATE, FERRITIN, TIBC, IRON, RETICCTPCT in the last 72 hours. Sepsis Labs: No results for input(s): PROCALCITON, LATICACIDVEN in the last 168 hours.  No results found for this or any previous visit (from the past 240 hour(s)).   Radiology Studies: Dg Chest 2 View  Result Date: 08/02/2017 CLINICAL DATA:  Shortness of breath. EXAM: CHEST  2 VIEW COMPARISON:  Radiographs and CT yesterday. FINDINGS: Post median sternotomy. Again seen cardiomegaly. Bilateral pleural effusions and bibasilar airspace disease, similar to prior exam. Worsening vascular congestion and perihilar aeration. No pneumothorax. IMPRESSION: Worsening CHF with increasing vascular congestion and worsening perihilar aeration. Bilateral pleural effusions and bibasilar airspace  disease appears similar. Electronically Signed   By: Rubye Oaks M.D.   On: 08/02/2017 22:47    Scheduled Meds: . aspirin EC  81 mg Oral Daily  . atorvastatin  80 mg Oral q1800  . ciprofloxacin-dexamethasone  4 drop Right EAR BID  . furosemide  60 mg Intravenous Q12H  . insulin aspart  0-9 Units Subcutaneous Q4H  . insulin aspart  3 Units Subcutaneous TID WC  . insulin detemir  10 Units Subcutaneous BID  . mouth rinse  15 mL Mouth Rinse BID  . metoprolol tartrate  12.5 mg Oral BID  . sodium chloride flush  3 mL Intravenous Q12H   Continuous Infusions: . sodium chloride    . heparin 900 Units/hr (08/03/17 0140)  . piperacillin-tazobactam (ZOSYN)  IV Stopped (08/03/17 1127)     LOS: 1 day   Rickey Barbara, MD [facility] Pager [phone]  If 7PM-7AM, please contact night-coverage [email or web address] Password [email or web address] 08/03/2017, 2:19 PM

## 2017-08-03 NOTE — Progress Notes (Signed)
Pt wearing CPAP mask and tubing with home machine- Machine is clean with no frayed cords.

## 2017-08-03 NOTE — Progress Notes (Signed)
ANTICOAGULATION CONSULT NOTE - Follow Up Consult  Pharmacy Consult for Heparin Indication: chest pain/ACS  Allergies  Allergen Reactions  . Bactrim [Sulfamethoxazole-Trimethoprim] Rash  . Codeine Nausea And Vomiting  . Mobic [Meloxicam]     swelling  . Morphine And Related Nausea Only and Other (See Comments)    confusion  . Silvadene [Silver Sulfadiazine]     Red hot rash and blisters  . Adhesive [Tape] Rash    Band aid brand  laxex  . Sulfamethoxazole-Trimethoprim Rash    Patient Measurements: Height: 5\' 3"  (160 cm) Weight: 203 lb 0.7 oz (92.1 kg) IBW/kg (Calculated) : 52.4 Heparin Dosing Weight:  74 kg  Vital Signs: Temp: 98.7 F (37.1 C) (01/04 0750) Temp Source: Oral (01/04 0750) BP: 110/75 (01/04 0750) Pulse Rate: 77 (01/04 0750)  Labs: Recent Labs    08/02/17 1929 08/03/17 0540 08/03/17 1118 08/03/17 1829  HGB 9.9*  --   --   --   HCT 31.8*  --   --   --   PLT 449*  --   --   --   LABPROT  --   --  15.2  --   INR  --   --  1.21  --   HEPARINUNFRC  --   --  1.22* 1.00*  CREATININE 1.56* 1.60*  --   --   TROPONINI 3.44*  --  2.68*  --     Estimated Creatinine Clearance: 38.3 mL/min (A) (by C-G formula based on SCr of 1.6 mg/dL (H)).  Assessment:  Anticoag: Lovenox>>heparin NSTEMI>IV heparin.  Heparin level this evening is still elevated at 1.0 after adjustments this afternoon.  No CBC today. Last Hgb 9.9, Plts 449. - LMWH 40mg  on 1/3 at 2050.  Goal of Therapy:  Heparin level 0.3-0.7 units/ml Monitor platelets by anticoagulation protocol: Yes   Plan:  Hold heparin for 1 hour  Restart IV heparin at 550 units/hr and recheck level in 6 hrs. Daily HL and CBC  Sheppard Coil PharmD., BCPS Clinical Pharmacist Pager [phone] 08/03/2017 7:31 PM

## 2017-08-03 NOTE — Progress Notes (Signed)
ANTICOAGULATION CONSULT NOTE - Initial Consult  Pharmacy Consult for Heparin  Indication: chest pain/ACS  Allergies  Allergen Reactions  . Bactrim [Sulfamethoxazole-Trimethoprim] Rash  . Codeine Nausea And Vomiting  . Mobic [Meloxicam]     swelling  . Morphine And Related Nausea Only and Other (See Comments)    confusion  . Silvadene [Silver Sulfadiazine]     Red hot rash and blisters  . Adhesive [Tape] Rash    Band aid brand  laxex  . Sulfamethoxazole-Trimethoprim Rash   Patient Measurements: Height: 5\' 3"  (160 cm) Weight: 205 lb (93 kg) IBW/kg (Calculated) : 52.4  Vital Signs: Temp: 97.5 F (36.4 C) (01/03 2033) Temp Source: Oral (01/03 2033) BP: 130/45 (01/03 2307) Pulse Rate: 92 (01/03 2307)  Labs: Recent Labs    08/02/17 1929  HGB 9.9*  HCT 31.8*  PLT 449*  CREATININE 1.56*  TROPONINI 3.44*    Estimated Creatinine Clearance: 39.5 mL/min (A) (by C-G formula based on SCr of 1.56 mg/dL (H)).   Medical History: Past Medical History:  Diagnosis Date  . Anxiety   . Arthritis   . Coronary artery disease   . Diabetes mellitus without complication   . Diabetic peripheral neuropathy   . Eczema   . GERD (gastroesophageal reflux disease)   . H/O hiatal hernia   . Headache(784.0)    allergies, sinus  . Hyperlipidemia   . Hypertension   . Myocardial infarction (HCC)   . PONV (postoperative nausea and vomiting)   . Shortness of breath   . Sleep apnea   . Umbilical hernia     Assessment: 67 y/o F transfer from outside hospital with NSTEMI, received prophylactic dose of Lovenox earlier in the evening on 1/3, Hgb 9.9, PLTS OK.   Goal of Therapy:  Heparin level 0.3-0.7 units/ml Monitor platelets by anticoagulation protocol: Yes   Plan:  -Heparin 2000 units BOLUS (reduced bolus with recent Lovenox 40 mg x 1 on 1/3 at 2050) -Start heparin drip at 900 units/hr -0900 HL -Daily CBC/HL -Monitor for bleeding   Abran Duke 08/03/2017,1:06 AM

## 2017-08-04 ENCOUNTER — Encounter (HOSPITAL_COMMUNITY): Payer: Self-pay

## 2017-08-04 ENCOUNTER — Inpatient Hospital Stay (HOSPITAL_COMMUNITY): Payer: Medicare PPO

## 2017-08-04 LAB — GLUCOSE, CAPILLARY
GLUCOSE-CAPILLARY: 118 mg/dL — AB (ref 65–99)
GLUCOSE-CAPILLARY: 270 mg/dL — AB (ref 65–99)
GLUCOSE-CAPILLARY: 358 mg/dL — AB (ref 65–99)
Glucose-Capillary: 52 mg/dL — ABNORMAL LOW (ref 65–99)
Glucose-Capillary: 119 mg/dL — ABNORMAL HIGH (ref 65–99)
Glucose-Capillary: 393 mg/dL — ABNORMAL HIGH (ref 65–99)
Glucose-Capillary: 404 mg/dL — ABNORMAL HIGH (ref 65–99)
Glucose-Capillary: 398 mg/dL — ABNORMAL HIGH (ref 65–99)

## 2017-08-04 LAB — BASIC METABOLIC PANEL
ANION GAP: 10 (ref 5–15)
BUN: 39 mg/dL — ABNORMAL HIGH (ref 6–20)
CO2: 31 mmol/L (ref 22–32)
Calcium: 8.5 mg/dL — ABNORMAL LOW (ref 8.9–10.3)
Chloride: 98 mmol/L — ABNORMAL LOW (ref 101–111)
Creatinine, Ser: 2.08 mg/dL — ABNORMAL HIGH (ref 0.44–1.00)
GFR calc Af Amer: 28 mL/min — ABNORMAL LOW (ref >60)
GFR, EST NON AFRICAN AMERICAN: 24 mL/min — AB (ref >60)
GLUCOSE: 131 mg/dL — AB (ref 65–99)
POTASSIUM: 5 mmol/L (ref 3.5–5.1)
Sodium: 139 mmol/L (ref 135–145)

## 2017-08-04 LAB — HEPARIN LEVEL (UNFRACTIONATED)
Heparin Unfractionated: 0.47 IU/mL (ref 0.30–0.70)
Heparin Unfractionated: 0.39 IU/mL (ref 0.30–0.70)

## 2017-08-04 LAB — CBC
HEMATOCRIT: 27.6 % — AB (ref 36.0–46.0)
HEMOGLOBIN: 8.5 g/dL — AB (ref 12.0–15.0)
MCH: 29.5 pg (ref 26.0–34.0)
MCHC: 30.8 g/dL (ref 30.0–36.0)
MCV: 95.8 fL (ref 78.0–100.0)
Platelets: 372 10*3/uL (ref 150–400)
RBC: 2.88 MIL/uL — ABNORMAL LOW (ref 3.87–5.11)
RDW: 15.4 % (ref 11.5–15.5)
WBC: 9.9 10*3/uL (ref 4.0–10.5)

## 2017-08-04 LAB — TROPONIN I: TROPONIN I: 2.43 ng/mL — AB (ref <0.03)

## 2017-08-04 MED ORDER — AYR SALINE NASAL NA GEL
1.0000 "application " | NASAL | Status: DC | PRN
  Filled 2017-08-04: qty 14.1

## 2017-08-04 MED ORDER — CLONAZEPAM 0.5 MG PO TABS
1.0000 mg | ORAL_TABLET | Freq: Every day | ORAL | Status: DC
  Administered 2017-08-04 – 2017-08-08 (×5): 1 mg via ORAL
  Filled 2017-08-04: qty 1
  Filled 2017-08-04: qty 2
  Filled 2017-08-04: qty 1
  Filled 2017-08-04 (×2): qty 2

## 2017-08-04 MED ORDER — METOPROLOL SUCCINATE ER 25 MG PO TB24
25.0000 mg | ORAL_TABLET | Freq: Every day | ORAL | Status: DC
  Administered 2017-08-04 – 2017-08-09 (×6): 25 mg via ORAL
  Filled 2017-08-04 (×6): qty 1

## 2017-08-04 MED ORDER — FLUTICASONE PROPIONATE 50 MCG/ACT NA SUSP
1.0000 | Freq: Every day | NASAL | Status: DC
  Administered 2017-08-04 – 2017-08-09 (×5): 1 via NASAL
  Filled 2017-08-04 (×2): qty 16

## 2017-08-04 MED ORDER — SALINE SPRAY 0.65 % NA SOLN
1.0000 | NASAL | Status: DC | PRN
  Administered 2017-08-04 – 2017-08-05 (×2): 1 via NASAL
  Filled 2017-08-04: qty 44

## 2017-08-04 NOTE — Progress Notes (Signed)
Patient states she has been feeling hot/clammy and sometimes nauseated when ambulating/exertion.

## 2017-08-04 NOTE — Progress Notes (Signed)
Called [facility] to ask about home CPAP.   CPAP applied with 6L bleeding in.

## 2017-08-04 NOTE — Plan of Care (Signed)
  Education: Knowledge of General Education information will improve 08/04/2017 0236 - Progressing by Jeanella Flattery, RN   Safety: Ability to remain free from injury will improve 08/04/2017 0236 - Progressing by Jeanella Flattery, RN

## 2017-08-04 NOTE — Progress Notes (Signed)
PROGRESS NOTE    Kiara [name]  ZOX:[id number] DOB: 10-04-1950 DOA: 08/02/2017 PCP: Majel Homer, MD    Brief Narrative:  67 y.o. female with medical history significant of CAD s/p CABG in 2014 here at [facility] following cardiologist in [place], DM on insulin pump, amputation was hospitalized on 12/28 at [facility] for acute mastoiditis due to pseudomonas, has been on cipro ear drops and cipro iv since and cipro iv changed to zosyn iv yesterday for unclear reason.  She feels her ear is improved.  However since her admission, she has been becoming more sob and has gained significant weight up to 221 lbs from 173 lbs.  She denies any cough.  No chest pain.  Swelling diffusely and starting to get blisters to her left leg from the swelling.  She has no h/o CHF in her past.  She was put on lasix 40mg  iv q 12 hours 2 days ago and has not improved.  Her cardiologist there was called who advised outpt cath.  She and her family were  Not happy with this and requested transfer to [facility].  Her trop went up to 1.4 and bnp was 115000.  She also had an echo there which showed no wall motion at the apex with EF in the 30s.  Pt transferred here after discussion with our cardiology team for cardiac evaluation   Assessment & Plan:   Principal Problem:   Acute CHF (congestive heart failure) (HCC) Active Problems:   Type 1 diabetes (HCC)   Essential hypertension   Anxiety   Coronary atherosclerosis due to severely calcified coronary lesion   PVD (peripheral vascular disease) (HCC)   S/P CABG x 2   NSTEMI (non-ST elevated myocardial infarction) (HCC)   CHF (congestive heart failure) (HCC)   Acute respiratory failure with hypoxia (HCC)   SOB (shortness of breath)  Principal Problem:   Acute CHF (congestive heart failure) (HCC) -Patient had been continued on lasix 60mg  iv q 12. -Cardiology following -Still clinically volume overloaded on exam -Diuretic rest today per Cardiology  Active Problems:   NSTEMI (non-ST  elevated myocardial infarction) (HCC) -Presently chest pain free -Cardiology recs noted. Suspect demand mismatch vs true ACS -Recommendation to diurese over weekend before patient is eligible for heart cath anticipated for Monday    Acute respiratory failure with hypoxia (HCC) -Likely due to edema -on diuretic "holiday" for today    Type 1 diabetes (HCC) -Continue on ssi sensitive -discussed with diabetic coordinator. Recs noted for basal and pre-meal coverage -Noted to be hypoglycemic overnight, improved    Essential hypertension -Blood pressure currently stable at present -Continue with current regimen as tolerated    Anxiety -held benzos at time of admission due to hypoxia -Continue home benzo as tolerated    Coronary atherosclerosis due to severely calcified coronary lesion -Per above, plan to correct volume status before considering heart cath, tentatively planned for this week    PVD (peripheral vascular disease) (HCC) - currently stable at this time    OM/mastoiditits on right  -Patient had been continued on cipro ear drops and zosyn   DVT prophylaxis: heparin infusion Code Status: Full Family Communication: Pt in room, family not at bedside Disposition Plan: Uncertain at this time  Consultants:   Cardiology  Procedures:     Antimicrobials: Anti-infectives (From admission, onward)   Start     Dose/Rate Route Frequency Ordered Stop   08/02/17 2230  piperacillin-tazobactam (ZOSYN) IVPB 3.375 g     3.375 g 12.5 mL/hr over  240 Minutes Intravenous Every 8 hours 08/02/17 2205        Subjective: No complaints currently  Objective: Vitals:   08/03/17 1935 08/04/17 0019 08/04/17 0642 08/04/17 1100  BP: 116/77 (!) 115/53 (!) 130/54 (!) 123/53  Pulse: 82 72 75 79  Resp: 18 18 18    Temp: 97.6 F (36.4 C) 98 F (36.7 C) (!) 97.5 F (36.4 C)   TempSrc: Oral Oral Oral   SpO2: 100% 100% 98%   Weight:   98.9 kg (218 lb)   Height:        Intake/Output  Summary (Last 24 hours) at 08/04/2017 1610 Last data filed at 08/04/2017 1542 Gross per 24 hour  Intake 1629.4 ml  Output 2260 ml  Net -630.6 ml   Filed Weights   08/02/17 2100 08/03/17 0417 08/04/17 0642  Weight: 93 kg (205 lb) 92.1 kg (203 lb 0.7 oz) 98.9 kg (218 lb)    Examination: General exam: Conversant, in no acute distress Respiratory system: normal chest rise, clear, no audible wheezing Cardiovascular system: regular rhythm, s1-s2 Gastrointestinal system: Nondistended, nontender, pos BS Central nervous system: No seizures, no tremors Extremities: No cyanosis, no joint deformities Skin: No rashes, no pallor Psychiatry: Affect normal // no auditory hallucinations   Data Reviewed: I have personally reviewed following labs and imaging studies  CBC: Recent Labs  Lab 08/02/17 1929 08/04/17 0307  WBC 12.8* 9.9  NEUTROABS 8.9*  --   HGB 9.9* 8.5*  HCT 31.8* 27.6*  MCV 95.2 95.8  PLT 449* 372   Basic Metabolic Panel: Recent Labs  Lab 08/02/17 1929 08/03/17 0540 08/04/17 0307  NA 134* 136 139  K 4.9 4.6 5.0  CL 99* 101 98*  CO2 25 28 31   GLUCOSE 102* 129* 131*  BUN 30* 31* 39*  CREATININE 1.56* 1.60* 2.08*  CALCIUM 8.5* 8.3* 8.5*   GFR: Estimated Creatinine Clearance: 30.6 mL/min (A) (by C-G formula based on SCr of 2.08 mg/dL (H)). Liver Function Tests: Recent Labs  Lab 08/02/17 1929  AST 34  ALT 33  ALKPHOS 139*  BILITOT 0.5  PROT 6.5  ALBUMIN 2.5*   No results for input(s): LIPASE, AMYLASE in the last 168 hours. No results for input(s): AMMONIA in the last 168 hours. Coagulation Profile: Recent Labs  Lab 08/03/17 1118  INR 1.21   Cardiac Enzymes: Recent Labs  Lab 08/02/17 1929 08/03/17 1118 08/03/17 1829 08/04/17 0047  TROPONINI 3.44* 2.68* 1.78* 2.43*   BNP (last 3 results) No results for input(s): PROBNP in the last 8760 hours. HbA1C: No results for input(s): HGBA1C in the last 72 hours. CBG: Recent Labs  Lab 08/04/17 0017  08/04/17 0324 08/04/17 0723 08/04/17 0800 08/04/17 1127  GLUCAP 270* 118* 52* 119* 393*   Lipid Profile: No results for input(s): CHOL, HDL, LDLCALC, TRIG, CHOLHDL, LDLDIRECT in the last 72 hours. Thyroid Function Tests: No results for input(s): TSH, T4TOTAL, FREET4, T3FREE, THYROIDAB in the last 72 hours. Anemia Panel: No results for input(s): VITAMINB12, FOLATE, FERRITIN, TIBC, IRON, RETICCTPCT in the last 72 hours. Sepsis Labs: No results for input(s): PROCALCITON, LATICACIDVEN in the last 168 hours.  No results found for this or any previous visit (from the past 240 hour(s)).   Radiology Studies: Dg Chest 2 View  Result Date: 08/02/2017 CLINICAL DATA:  Shortness of breath. EXAM: CHEST  2 VIEW COMPARISON:  Radiographs and CT yesterday. FINDINGS: Post median sternotomy. Again seen cardiomegaly. Bilateral pleural effusions and bibasilar airspace disease, similar to prior exam. Worsening vascular  congestion and perihilar aeration. No pneumothorax. IMPRESSION: Worsening CHF with increasing vascular congestion and worsening perihilar aeration. Bilateral pleural effusions and bibasilar airspace disease appears similar. Electronically Signed   By: Rubye Oaks M.D.   On: 08/02/2017 22:47   Dg Chest Port 1 View  Result Date: 08/04/2017 CLINICAL DATA:  Dyspnea EXAM: PORTABLE CHEST 1 VIEW COMPARISON:  08/02/2017 FINDINGS: Cardiomegaly with pulmonary vascular congestion and suspected mild interstitial edema, although improved. Small to moderate bilateral pleural effusions, left greater than right. Postsurgical changes related to prior CABG.  Median sternotomy. IMPRESSION: Cardiomegaly with mild interstitial edema, improved. Small to moderate bilateral pleural effusions, left greater than right. Electronically Signed   By: Charline Bills M.D.   On: 08/04/2017 10:53    Scheduled Meds: . aspirin EC  81 mg Oral Daily  . atorvastatin  80 mg Oral q1800  . ciprofloxacin-dexamethasone  4 drop  Right EAR BID  . clonazePAM  1 mg Oral QHS  . fluticasone  1 spray Each Nare Daily  . insulin aspart  0-9 Units Subcutaneous Q4H  . insulin aspart  3 Units Subcutaneous TID WC  . insulin detemir  10 Units Subcutaneous BID  . mouth rinse  15 mL Mouth Rinse BID  . metoprolol succinate  25 mg Oral Daily  . sodium chloride flush  3 mL Intravenous Q12H   Continuous Infusions: . sodium chloride    . heparin 550 Units/hr (08/04/17 0852)  . piperacillin-tazobactam (ZOSYN)  IV Stopped (08/04/17 1044)     LOS: 2 days   Rickey Barbara, MD [facility] Pager [phone]  If 7PM-7AM, please contact night-coverage [email or web address] Password [email or web address] 08/04/2017, 4:10 PM

## 2017-08-04 NOTE — Progress Notes (Signed)
Md notified for CBG of 404. Orders received to give 9 units of insulin, continue with previous meal coverage orders.

## 2017-08-04 NOTE — Progress Notes (Addendum)
Inpatient Diabetes Program Recommendations  AACE/ADA: New Consensus Statement on Inpatient Glycemic Control (2015)  Target Ranges:  Prepandial:   less than 140 mg/dL      Peak postprandial:   less than 180 mg/dL (1-2 hours)      Critically ill patients:  140 - 180 mg/dL   Lab Results  Component Value Date   GLUCAP 119 (H) 08/04/2017   HGBA1C 8.7 (H) 04/09/2013    Review of Glycemic Control  Results for EMERAUDE, MENGER (MRN [id number]) as of 08/04/2017 10:38  Ref. Range 08/03/2017 20:25 08/04/2017 00:17 08/04/2017 03:24 08/04/2017 07:23 08/04/2017 08:00  Glucose-Capillary Latest Ref Range: 65 - 99 mg/dL 017 (H) 793 (H) 903 (H) 52 (L) 119 (H)   Diabetes history: Type 1 DM Outpatient Diabetes medications:  Insulin pump: Medtronic 12-7a- 1.25 units/hr 7a-12 MN-1. 75 units/hr 1 unit/15 grams CHO 1 unit for every 50 mg/dL greater than 009 mg/dL  Current orders for Inpatient glycemic control: Novolog sensitive q 4 hours, 3 units tid, 10 units of Levemir bid  Inpatient Diabetes Program Recommendations:    Note that patient has insulin pump.  Overnight patient stated that she did not feel like managing her pump in the hospital and thus removed it. If the patient wants to go back on her pump today- she should not re- start it until 9pm tonight since she received Levemir at 2106 and basal insulin works for about 24 hours.   If she wants to stay off her pump and until she restarts it- Please stop the Novolog 0-9 q4h and change to Novolog 0-9 units tid with meals.  Continue Novolog 0-5 units qhs. Continue Novolog 3 units tid but hold if patient eats less than 50%. Give Levemir 10 units bid only if the patient is going to stay off her pump.    Text paged Dr. Rhona Leavens @ 10:48  Susette Racer, RN, BA, MHA, CDE Diabetes Coordinator Inpatient Diabetes Program  [phone] (Team Pager)  08/04/2017 10:45 AM

## 2017-08-04 NOTE — Progress Notes (Signed)
ANTICOAGULATION CONSULT NOTE   Pharmacy Consult for Heparin  Indication: chest pain/ACS  Allergies  Allergen Reactions  . Bactrim [Sulfamethoxazole-Trimethoprim] Rash  . Codeine Nausea And Vomiting  . Mobic [Meloxicam]     swelling  . Morphine And Related Nausea Only and Other (See Comments)    confusion  . Silvadene [Silver Sulfadiazine]     Red hot rash and blisters  . Adhesive [Tape] Rash    Band aid brand  laxex  . Sulfamethoxazole-Trimethoprim Rash   Patient Measurements: Height: 5\' 3"  (160 cm) Weight: 203 lb 0.7 oz (92.1 kg) IBW/kg (Calculated) : 52.4  Vital Signs: Temp: 98 F (36.7 C) (01/05 0019) Temp Source: Oral (01/05 0019) BP: 115/53 (01/05 0019) Pulse Rate: 72 (01/05 0019)  Labs: Recent Labs    08/02/17 1929 08/03/17 0540 08/03/17 1118 08/03/17 1829 08/04/17 0047 08/04/17 0307  HGB 9.9*  --   --   --   --  8.5*  HCT 31.8*  --   --   --   --  27.6*  PLT 449*  --   --   --   --  372  LABPROT  --   --  15.2  --   --   --   INR  --   --  1.21  --   --   --   HEPARINUNFRC  --   --  1.22* 1.00*  --  0.47  CREATININE 1.56* 1.60*  --   --   --  2.08*  TROPONINI 3.44*  --  2.68* 1.78* 2.43*  --     Estimated Creatinine Clearance: 29.5 mL/min (A) (by C-G formula based on SCr of 2.08 mg/dL (H)).   Medical History: Past Medical History:  Diagnosis Date  . Anxiety   . Arthritis   . Coronary artery disease   . Diabetes mellitus without complication   . Diabetic peripheral neuropathy   . Eczema   . GERD (gastroesophageal reflux disease)   . H/O hiatal hernia   . Headache(784.0)    allergies, sinus  . Hyperlipidemia   . Hypertension   . Myocardial infarction (HCC)   . PONV (postoperative nausea and vomiting)   . Shortness of breath   . Sleep apnea   . Umbilical hernia     Assessment: 67 y/o F transfer from outside hospital with NSTEMI, now on heparin drip, heparin level therapeutic x 1 this AM after rate decrease  Goal of Therapy:  Heparin  level 0.3-0.7 units/ml Monitor platelets by anticoagulation protocol: Yes   Plan:  Cont heparin at 550 units/hr 1200 HL  Kiara [name] 08/04/2017,4:27 AM

## 2017-08-04 NOTE — Progress Notes (Signed)
ANTICOAGULATION CONSULT NOTE   Pharmacy Consult for Heparin  Indication: chest pain/ACS  Allergies  Allergen Reactions  . Bactrim [Sulfamethoxazole-Trimethoprim] Rash  . Codeine Nausea And Vomiting  . Mobic [Meloxicam]     swelling  . Morphine And Related Nausea Only and Other (See Comments)    confusion  . Silvadene [Silver Sulfadiazine]     Red hot rash and blisters  . Adhesive [Tape] Rash    Band aid brand  laxex  . Sulfamethoxazole-Trimethoprim Rash   Patient Measurements: Height: 5\' 3"  (160 cm) Weight: 218 lb (98.9 kg) IBW/kg (Calculated) : 52.4  Vital Signs: Temp: 97.5 F (36.4 C) (01/05 0642) Temp Source: Oral (01/05 0642) BP: 123/53 (01/05 1100) Pulse Rate: 79 (01/05 1100)  Labs: Recent Labs    08/02/17 1929 08/03/17 0540  08/03/17 1118 08/03/17 1829 08/04/17 0047 08/04/17 0307 08/04/17 1203  HGB 9.9*  --   --   --   --   --  8.5*  --   HCT 31.8*  --   --   --   --   --  27.6*  --   PLT 449*  --   --   --   --   --  372  --   LABPROT  --   --   --  15.2  --   --   --   --   INR  --   --   --  1.21  --   --   --   --   HEPARINUNFRC  --   --    < > 1.22* 1.00*  --  0.47 0.39  CREATININE 1.56* 1.60*  --   --   --   --  2.08*  --   TROPONINI 3.44*  --   --  2.68* 1.78* 2.43*  --   --    < > = values in this interval not displayed.   Estimated Creatinine Clearance: 30.6 mL/min (A) (by C-G formula based on SCr of 2.08 mg/dL (H)).  Medical History: Past Medical History:  Diagnosis Date  . Anxiety   . Arthritis   . Coronary artery disease   . Diabetes mellitus without complication (HCC)   . Diabetic peripheral neuropathy (HCC)   . Eczema   . GERD (gastroesophageal reflux disease)   . H/O hiatal hernia   . Headache(784.0)    allergies, sinus  . Hyperlipidemia   . Hypertension   . Myocardial infarction (HCC)   . PONV (postoperative nausea and vomiting)   . Shortness of breath   . Sleep apnea   . Umbilical hernia    Assessment: 67 y/o F  transfer from outside hospital with NSTEMI, now on heparin drip awaiting cath on Monday if renal and volume status appropriate. HgB 8.5, PLT WNL; no overt bleeding  Heparin level therapeutic: 0.39  Goal of Therapy:  Heparin level 0.3-0.7 units/ml Monitor platelets by anticoagulation protocol: Yes   Plan:  Continue heparin at 550 units/hr  Daily heparin level/CBC Monitor for s/sx of bleeding  Wyllow Seigler L Kauan Kloosterman 08/04/2017,1:40 PM

## 2017-08-04 NOTE — Progress Notes (Signed)
Progress Note  Patient Name: Kiara [name] Date of Encounter: 08/04/2017  Primary Cardiologist: Octavio Manns  Subjective   Continued SOB  Inpatient Medications    Scheduled Meds: . aspirin EC  81 mg Oral Daily  . atorvastatin  80 mg Oral q1800  . ciprofloxacin-dexamethasone  4 drop Right EAR BID  . furosemide  60 mg Intravenous Q12H  . insulin aspart  0-9 Units Subcutaneous Q4H  . insulin aspart  3 Units Subcutaneous TID WC  . insulin detemir  10 Units Subcutaneous BID  . mouth rinse  15 mL Mouth Rinse BID  . metoprolol tartrate  12.5 mg Oral BID  . sodium chloride flush  3 mL Intravenous Q12H   Continuous Infusions: . sodium chloride    . heparin 550 Units/hr (08/04/17 0852)  . piperacillin-tazobactam (ZOSYN)  IV 3.375 g (08/04/17 4782)   PRN Meds: sodium chloride, acetaminophen, hydrocortisone cream, ondansetron (ZOFRAN) IV, sodium chloride flush   Vital Signs    Vitals:   08/03/17 0750 08/03/17 1935 08/04/17 0019 08/04/17 0642  BP: 110/75 116/77 (!) 115/53 (!) 130/54  Pulse: 77 82 72 75  Resp: 18 18 18 18   Temp: 98.7 F (37.1 C) 97.6 F (36.4 C) 98 F (36.7 C) (!) 97.5 F (36.4 C)  TempSrc: Oral Oral Oral Oral  SpO2: 97% 100% 100% 98%  Weight:    218 lb (98.9 kg)  Height:        Intake/Output Summary (Last 24 hours) at 08/04/2017 0945 Last data filed at 08/04/2017 0857 Gross per 24 hour  Intake 1389.4 ml  Output 2500 ml  Net -1110.6 ml   Filed Weights   08/02/17 2100 08/03/17 0417 08/04/17 0642  Weight: 205 lb (93 kg) 203 lb 0.7 oz (92.1 kg) 218 lb (98.9 kg)    Telemetry    SR - Personally Reviewed  ECG    n/a  Physical Exam   GEN: No acute distress.   Neck: elevated JVD Cardiac: RRR, no murmurs, rubs, or gallops.  Respiratory: Clear to auscultation bilaterally. GI: Soft, nontender, non-distended  MS: 2+ LE edema; No deformity. Neuro:  Nonfocal  Psych: Normal affect   Labs    Chemistry Recent Labs  Lab 08/02/17 1929 08/03/17 0540  08/04/17 0307  NA 134* 136 139  K 4.9 4.6 5.0  CL 99* 101 98*  CO2 25 28 31   GLUCOSE 102* 129* 131*  BUN 30* 31* 39*  CREATININE 1.56* 1.60* 2.08*  CALCIUM 8.5* 8.3* 8.5*  PROT 6.5  --   --   ALBUMIN 2.5*  --   --   AST 34  --   --   ALT 33  --   --   ALKPHOS 139*  --   --   BILITOT 0.5  --   --   GFRNONAA 34* 33* 24*  GFRAA 39* 38* 28*  ANIONGAP 10 7 10      Hematology Recent Labs  Lab 08/02/17 1929 08/04/17 0307  WBC 12.8* 9.9  RBC 3.34* 2.88*  HGB 9.9* 8.5*  HCT 31.8* 27.6*  MCV 95.2 95.8  MCH 29.6 29.5  MCHC 31.1 30.8  RDW 15.7* 15.4  PLT 449* 372    Cardiac Enzymes Recent Labs  Lab 08/02/17 1929 08/03/17 1118 08/03/17 1829 08/04/17 0047  TROPONINI 3.44* 2.68* 1.78* 2.43*   No results for input(s): TROPIPOC in the last 168 hours.   BNP Recent Labs  Lab 08/02/17 1929  BNP 1,347.3*     DDimer No results for input(s):  DDIMER in the last 168 hours.   Radiology    Dg Chest 2 View  Result Date: 08/02/2017 CLINICAL DATA:  Shortness of breath. EXAM: CHEST  2 VIEW COMPARISON:  Radiographs and CT yesterday. FINDINGS: Post median sternotomy. Again seen cardiomegaly. Bilateral pleural effusions and bibasilar airspace disease, similar to prior exam. Worsening vascular congestion and perihilar aeration. No pneumothorax. IMPRESSION: Worsening CHF with increasing vascular congestion and worsening perihilar aeration. Bilateral pleural effusions and bibasilar airspace disease appears similar. Electronically Signed   By: Rubye Oaks M.D.   On: 08/02/2017 22:47    Cardiac Studies     Patient Profile     67 y.o. female with PMH of CAD s/p CABG (SVG-PDA, LIMA-LAD) '14, HTN, HL, DM, OSA on Cpap who presented to [facility] with ear pain and mastoiditis. Developed acute HF and positive troponins.    Assessment & Plan    1. Acute on chronic combined systolic/diastolic HF - [facility] echo per report LVEF 30-35%, down from 40-45% from 2014 echo. - patient  presented to [facility] with mastoiditis, developed CHF with IVFs - negative 1.5 liters yesterday, negative 1.4 liters since admission. She is on lasix 60mg  IV bid. Significant uptrend in Cr today, d/c lasix.  - medical therapy with ASA 81, atorva 80, hep gtt, lopressor 12.5mg  bid. No ACE/ARB/ARN/aldactone due to poor renal function. Will change loprressor to Toprol XL 25mg  daily in setting of sysotlic dysfunction - repeat CXR - she remains fluid overloaded, hopefully a diuretic rest today will allow diuresis tomororw.    2. NSTEMI/CAD s/p CABG (SVG-PDA, LIMA-LAD) '14 - has not had chest pain, trop up and down but peak 3.4 - plan for cath Monday pending volume status and renal function - medical therapy as reported below.    For questions or updates, please contact [facility] Please consult [email or web address] for contact info under Cardiology/STEMI.      Joanie Coddington, MD  08/04/2017, 9:45 AM

## 2017-08-04 NOTE — Progress Notes (Signed)
PT Cancellation Note  Patient Details Name: Kiara [name] MRN: [id number] DOB: Jul 02, 1951   Cancelled Treatment:    Reason Eval/Treat Not Completed: Patient not medically ready. Will follow-up for PT evaluation when appropriate.  [name], PT, DPT Acute Rehab Services  Pager: [phone]  [name] 08/04/2017, 8:19 AM

## 2017-08-04 NOTE — Progress Notes (Signed)
3e23 [place]. CBG 404. 393 prior to 12units at lunch. pt 52 at am CBG, refused long acting. please advise.   Page to Dr Rhona Leavens

## 2017-08-04 NOTE — Progress Notes (Signed)
During report patient had a CBG of 52, she was provided crackers and OJ by off-going nurse; pt denies symptoms of hypoglycemia upon result. Recheck requested.

## 2017-08-04 NOTE — Progress Notes (Signed)
OT Cancellation Note  Patient Details Name: Kiara [name] MRN: [id number] DOB: 1951-07-15   Cancelled Treatment:    Reason Eval/Treat Not Completed: Patient not medically ready. Elevated troponin.  Will follow up for OT evaluation when appropriate.  Cipriano Mile OTR/L 08/04/2017, 8:35 AM

## 2017-08-05 LAB — GLUCOSE, CAPILLARY
GLUCOSE-CAPILLARY: 150 mg/dL — AB (ref 65–99)
GLUCOSE-CAPILLARY: 243 mg/dL — AB (ref 65–99)
GLUCOSE-CAPILLARY: 316 mg/dL — AB (ref 65–99)
GLUCOSE-CAPILLARY: 321 mg/dL — AB (ref 65–99)
GLUCOSE-CAPILLARY: 409 mg/dL — AB (ref 65–99)
Glucose-Capillary: 106 mg/dL — ABNORMAL HIGH (ref 65–99)
Glucose-Capillary: 172 mg/dL — ABNORMAL HIGH (ref 65–99)
Glucose-Capillary: 162 mg/dL — ABNORMAL HIGH (ref 65–99)

## 2017-08-05 LAB — BASIC METABOLIC PANEL
ANION GAP: 12 (ref 5–15)
BUN: 37 mg/dL — ABNORMAL HIGH (ref 6–20)
CHLORIDE: 96 mmol/L — AB (ref 101–111)
CO2: 26 mmol/L (ref 22–32)
CREATININE: 1.73 mg/dL — AB (ref 0.44–1.00)
Calcium: 8.6 mg/dL — ABNORMAL LOW (ref 8.9–10.3)
GFR calc non Af Amer: 30 mL/min — ABNORMAL LOW (ref >60)
GFR, EST AFRICAN AMERICAN: 35 mL/min — AB (ref >60)
Glucose, Bld: 113 mg/dL — ABNORMAL HIGH (ref 65–99)
Potassium: 4 mmol/L (ref 3.5–5.1)
Sodium: 134 mmol/L — ABNORMAL LOW (ref 135–145)

## 2017-08-05 LAB — CBC
HCT: 28.2 % — ABNORMAL LOW (ref 36.0–46.0)
HEMOGLOBIN: 8.7 g/dL — AB (ref 12.0–15.0)
MCH: 29.2 pg (ref 26.0–34.0)
MCHC: 30.9 g/dL (ref 30.0–36.0)
MCV: 94.6 fL (ref 78.0–100.0)
Platelets: 370 10*3/uL (ref 150–400)
RBC: 2.98 MIL/uL — AB (ref 3.87–5.11)
RDW: 15.3 % (ref 11.5–15.5)
WBC: 9.2 10*3/uL (ref 4.0–10.5)

## 2017-08-05 LAB — HEPARIN LEVEL (UNFRACTIONATED)
Heparin Unfractionated: 0.18 IU/mL — ABNORMAL LOW (ref 0.30–0.70)
Heparin Unfractionated: 0.17 IU/mL — ABNORMAL LOW (ref 0.30–0.70)

## 2017-08-05 MED ORDER — INSULIN ASPART 100 UNIT/ML ~~LOC~~ SOLN
9.0000 [IU] | Freq: Once | SUBCUTANEOUS | Status: AC
  Administered 2017-08-05: 9 [IU] via SUBCUTANEOUS

## 2017-08-05 MED ORDER — FUROSEMIDE 10 MG/ML IJ SOLN
60.0000 mg | Freq: Once | INTRAMUSCULAR | Status: AC
Start: 2017-08-05 — End: 2017-08-05
  Administered 2017-08-05: 60 mg via INTRAVENOUS
  Filled 2017-08-05: qty 6

## 2017-08-05 NOTE — Progress Notes (Signed)
PROGRESS NOTE    Kiara [name]  GPQ:[id number] DOB: [birth year] DOA: 08/02/2017 PCP: Majel Homer, MD    Brief Narrative:  67 y.o. female with medical history significant of CAD s/p CABG in 2014 here at [facility] following cardiologist in [place], DM on insulin pump, amputation was hospitalized on 12/28 at [facility] for acute mastoiditis due to pseudomonas, has been on cipro ear drops and cipro iv since and cipro iv changed to zosyn iv yesterday for unclear reason.  She feels her ear is improved.  However since her admission, she has been becoming more sob and has gained significant weight up to 221 lbs from 173 lbs.  She denies any cough.  No chest pain.  Swelling diffusely and starting to get blisters to her left leg from the swelling.  She has no h/o CHF in her past.  She was put on lasix 40mg  iv q 12 hours 2 days ago and has not improved.  Her cardiologist there was called who advised outpt cath.  She and her family were  Not happy with this and requested transfer to [facility].  Her trop went up to 1.4 and bnp was 115000.  She also had an echo there which showed no wall motion at the apex with EF in the 30s.  Pt transferred here after discussion with our cardiology team for cardiac evaluation   Assessment & Plan:   Principal Problem:   Acute CHF (congestive heart failure) (HCC) Active Problems:   Type 1 diabetes (HCC)   Essential hypertension   Anxiety   Coronary atherosclerosis due to severely calcified coronary lesion   PVD (peripheral vascular disease) (HCC)   S/P CABG x 2   NSTEMI (non-ST elevated myocardial infarction) (HCC)   CHF (congestive heart failure) (HCC)   Acute respiratory failure with hypoxia (HCC)   SOB (shortness of breath)  Principal Problem:   Acute CHF (congestive heart failure) (HCC) -Patient had been continued on lasix 60mg  iv q 12. -Cardiology following -Still clinically volume overloaded on exam -Lasix continued per Cardiology  Active Problems:   NSTEMI (non-ST  elevated myocardial infarction) (HCC) -Presently chest pain free -Cardiology recs noted. Suspect demand mismatch vs true ACS -Recommendation to diurese over weekend before patient is eligible for heart cath anticipated for possible Monday/Tuesday    Acute respiratory failure with hypoxia (HCC) -Likely due to edema -Continued on lasix per Cardiology    Type 1 diabetes (HCC) -Continue on ssi sensitive -discussed with diabetic coordinator. Recs noted for basal and pre-meal coverage -Continue insulin per current regimen    Essential hypertension -Blood pressure currently stable at present -Will continue with current regimen    Anxiety -held benzos at time of admission due to hypoxia -Tolerating anxiolytic at night    Coronary atherosclerosis due to severely calcified coronary lesion -Per above, plan to correct volume status before considering heart cath, tentatively planned for this week    PVD (peripheral vascular disease) (HCC) - presently stable    OM/mastoiditits on right  -Patient had been continued on cipro ear drops and zosyn   DVT prophylaxis: heparin infusion Code Status: Full Family Communication: Pt in room, family not at bedside Disposition Plan: Uncertain at this time  Consultants:   Cardiology  Procedures:     Antimicrobials: Anti-infectives (From admission, onward)   Start     Dose/Rate Route Frequency Ordered Stop   08/02/17 2230  piperacillin-tazobactam (ZOSYN) IVPB 3.375 g     3.375 g 12.5 mL/hr over 240 Minutes Intravenous Every 8 hours  08/02/17 2205        Subjective: Without complaints at this time  Objective: Vitals:   08/04/17 2007 08/05/17 0425 08/05/17 0951 08/05/17 1225  BP: 133/86 (!) 171/82 (!) 129/56 (!) 150/64  Pulse: 81 76 85 78  Resp: 18 18  18   Temp: 98.6 F (37 C) (!) 97.5 F (36.4 C)  98.5 F (36.9 C)  TempSrc: Oral Oral  Oral  SpO2: 97% 96%  99%  Weight:  98.9 kg (218 lb 1.6 oz)    Height:        Intake/Output  Summary (Last 24 hours) at 08/05/2017 1446 Last data filed at 08/05/2017 1329 Gross per 24 hour  Intake 1047.64 ml  Output 2210 ml  Net -1162.36 ml   Filed Weights   08/03/17 0417 08/04/17 0642 08/05/17 0425  Weight: 92.1 kg (203 lb 0.7 oz) 98.9 kg (218 lb) 98.9 kg (218 lb 1.6 oz)    Examination: General exam: Awake, laying in bed, in nad Respiratory system: Normal respiratory effort, no wheezing Cardiovascular system: regular rate, s1, s2 Gastrointestinal system: Soft, nondistended, positive BS Central nervous system: CN2-12 grossly intact, strength intact Extremities: Perfused, no clubbing, 1-2+ pitting LE edema Skin: Normal skin turgor, no notable skin lesions seen Psychiatry: Mood normal // no visual hallucinations   Data Reviewed: I have personally reviewed following labs and imaging studies  CBC: Recent Labs  Lab 08/02/17 1929 08/04/17 0307 08/05/17 0455  WBC 12.8* 9.9 9.2  NEUTROABS 8.9*  --   --   HGB 9.9* 8.5* 8.7*  HCT 31.8* 27.6* 28.2*  MCV 95.2 95.8 94.6  PLT 449* 372 370   Basic Metabolic Panel: Recent Labs  Lab 08/02/17 1929 08/03/17 0540 08/04/17 0307 08/05/17 0455  NA 134* 136 139 134*  K 4.9 4.6 5.0 4.0  CL 99* 101 98* 96*  CO2 25 28 31 26   GLUCOSE 102* 129* 131* 113*  BUN 30* 31* 39* 37*  CREATININE 1.56* 1.60* 2.08* 1.73*  CALCIUM 8.5* 8.3* 8.5* 8.6*   GFR: Estimated Creatinine Clearance: 36.8 mL/min (A) (by C-G formula based on SCr of 1.73 mg/dL (H)). Liver Function Tests: Recent Labs  Lab 08/02/17 1929  AST 34  ALT 33  ALKPHOS 139*  BILITOT 0.5  PROT 6.5  ALBUMIN 2.5*   No results for input(s): LIPASE, AMYLASE in the last 168 hours. No results for input(s): AMMONIA in the last 168 hours. Coagulation Profile: Recent Labs  Lab 08/03/17 1118  INR 1.21   Cardiac Enzymes: Recent Labs  Lab 08/02/17 1929 08/03/17 1118 08/03/17 1829 08/04/17 0047  TROPONINI 3.44* 2.68* 1.78* 2.43*   BNP (last 3 results) No results for  input(s): PROBNP in the last 8760 hours. HbA1C: No results for input(s): HGBA1C in the last 72 hours. CBG: Recent Labs  Lab 08/05/17 0015 08/05/17 0414 08/05/17 0746 08/05/17 1223 08/05/17 1327  GLUCAP 243* 106* 150* 321* 409*   Lipid Profile: No results for input(s): CHOL, HDL, LDLCALC, TRIG, CHOLHDL, LDLDIRECT in the last 72 hours. Thyroid Function Tests: No results for input(s): TSH, T4TOTAL, FREET4, T3FREE, THYROIDAB in the last 72 hours. Anemia Panel: No results for input(s): VITAMINB12, FOLATE, FERRITIN, TIBC, IRON, RETICCTPCT in the last 72 hours. Sepsis Labs: No results for input(s): PROCALCITON, LATICACIDVEN in the last 168 hours.  No results found for this or any previous visit (from the past 240 hour(s)).   Radiology Studies: Dg Chest Port 1 View  Result Date: 08/04/2017 CLINICAL DATA:  Dyspnea EXAM: PORTABLE CHEST 1 VIEW  COMPARISON:  08/02/2017 FINDINGS: Cardiomegaly with pulmonary vascular congestion and suspected mild interstitial edema, although improved. Small to moderate bilateral pleural effusions, left greater than right. Postsurgical changes related to prior CABG.  Median sternotomy. IMPRESSION: Cardiomegaly with mild interstitial edema, improved. Small to moderate bilateral pleural effusions, left greater than right. Electronically Signed   By: Charline Bills M.D.   On: 08/04/2017 10:53    Scheduled Meds: . aspirin EC  81 mg Oral Daily  . atorvastatin  80 mg Oral q1800  . ciprofloxacin-dexamethasone  4 drop Right EAR BID  . clonazePAM  1 mg Oral QHS  . fluticasone  1 spray Each Nare Daily  . insulin aspart  0-9 Units Subcutaneous Q4H  . insulin aspart  3 Units Subcutaneous TID WC  . insulin detemir  10 Units Subcutaneous BID  . mouth rinse  15 mL Mouth Rinse BID  . metoprolol succinate  25 mg Oral Daily  . sodium chloride flush  3 mL Intravenous Q12H   Continuous Infusions: . sodium chloride    . heparin 650 Units/hr (08/05/17 0622)  .  piperacillin-tazobactam (ZOSYN)  IV Stopped (08/05/17 9562)     LOS: 3 days   Rickey Barbara, MD [facility] Pager [phone]  If 7PM-7AM, please contact night-coverage [email or web address] Password [email or web address] 08/05/2017, 2:46 PM

## 2017-08-05 NOTE — Progress Notes (Signed)
Barrier cream applied to pt's bottom per request. Pt displays ability to stand/walk short distance to bedside commode today; will be allowed independent use of BSC today with assistance.

## 2017-08-05 NOTE — Progress Notes (Signed)
ANTICOAGULATION CONSULT NOTE   Pharmacy Consult for Heparin  Indication: chest pain/ACS  Allergies  Allergen Reactions  . Bactrim [Sulfamethoxazole-Trimethoprim] Rash  . Codeine Nausea And Vomiting  . Mobic [Meloxicam]     swelling  . Morphine And Related Nausea Only and Other (See Comments)    confusion  . Silvadene [Silver Sulfadiazine]     Red hot rash and blisters  . Adhesive [Tape] Rash    Band aid brand  laxex  . Sulfamethoxazole-Trimethoprim Rash   Patient Measurements: Height: 5\' 3"  (160 cm) Weight: 218 lb 1.6 oz (98.9 kg) IBW/kg (Calculated) : 52.4  Vital Signs: Temp: 98.5 F (36.9 C) (01/06 1225) Temp Source: Oral (01/06 1225) BP: 150/64 (01/06 1225) Pulse Rate: 78 (01/06 1225)  Labs: Recent Labs    08/02/17 1929 08/03/17 0540  08/03/17 1118 08/03/17 1829 08/04/17 0047 08/04/17 0307 08/04/17 1203 08/05/17 0455 08/05/17 1346  HGB 9.9*  --   --   --   --   --  8.5*  --  8.7*  --   HCT 31.8*  --   --   --   --   --  27.6*  --  28.2*  --   PLT 449*  --   --   --   --   --  372  --  370  --   LABPROT  --   --   --  15.2  --   --   --   --   --   --   INR  --   --   --  1.21  --   --   --   --   --   --   HEPARINUNFRC  --   --    < > 1.22* 1.00*  --  0.47 0.39 0.18* 0.17*  CREATININE 1.56* 1.60*  --   --   --   --  2.08*  --  1.73*  --   TROPONINI 3.44*  --   --  2.68* 1.78* 2.43*  --   --   --   --    < > = values in this interval not displayed.    Estimated Creatinine Clearance: 36.8 mL/min (A) (by C-G formula based on SCr of 1.73 mg/dL (H)).   Medical History: Past Medical History:  Diagnosis Date  . Anxiety   . Arthritis   . Coronary artery disease   . Diabetes mellitus without complication (HCC)   . Diabetic peripheral neuropathy (HCC)   . Eczema   . GERD (gastroesophageal reflux disease)   . H/O hiatal hernia   . Headache(784.0)    allergies, sinus  . Hyperlipidemia   . Hypertension   . Myocardial infarction (HCC)   . PONV  (postoperative nausea and vomiting)   . Shortness of breath   . Sleep apnea   . Umbilical hernia     Assessment: 67 y/o F transfer from outside hospital with NSTEMI, now on heparin drip, heparin level remains subtherapeutic this afternoon. No issues per RN.   Goal of Therapy:  Heparin level 0.3-0.7 units/ml Monitor platelets by anticoagulation protocol: Yes   Plan:  Increase heparin infusion to 850 units/hr Check anti-Xa level in 8 hours and daily while on heparin Continue to monitor H&H and platelets  Thank you for allowing [place] to participate in this patients care.  Signe Colt, PharmD Clinical phone for 08/05/2017: x [phone] 08/05/2017 2:51 PM

## 2017-08-05 NOTE — Evaluation (Signed)
Physical Therapy Evaluation Patient Details Name: Kiara [name] MRN: [id number] DOB: January 22, 1951 Today's Date: 08/05/2017   History of Present Illness  67 y.o. female with PMH of CAD s/p CABG (SVG-PDA, LIMA-LAD) '14, HTN, HL, DM, OSA on Cpap who presented to [facility] with ear pain and mastoiditis. Developed acute HF and positive troponins.  Clinical Impression  Pt presents with limited mobility primarily due to SOB. Can benefit from PT to maximize independence and mobility. Expect pt will progress as medical condition improves.    Follow Up Recommendations No PT follow up    Equipment Recommendations  None recommended by PT    Recommendations for Other Services       Precautions / Restrictions Precautions Precaution Comments: fatigues quickly Restrictions Weight Bearing Restrictions: No      Mobility  Bed Mobility               General bed mobility comments: up in chair  Transfers Overall transfer level: Needs assistance Equipment used: None Transfers: Sit to/from Stand;Stand Pivot Transfers Sit to Stand: Supervision         General transfer comment: Supervision for line management primarily  Ambulation/Gait Ambulation/Gait assistance: Min guard Ambulation Distance (Feet): 20 Feet Assistive device: None(Using sink counter for support) Gait Pattern/deviations: Step-through pattern;Decreased stride length Gait velocity: decr Gait velocity interpretation: Below normal speed for age/gender General Gait Details: Pt with gait limited by SOB. Pt on 5L of O2. Dyspnea 3/4.   Stairs            Wheelchair Mobility    Modified Rankin (Stroke Patients Only)       Balance Overall balance assessment: Needs assistance Sitting-balance support: No upper extremity supported Sitting balance-Leahy Scale: Normal     Standing balance support: During functional activity;No upper extremity supported Standing balance-Leahy Scale: Fair                                Pertinent Vitals/Pain Pain Assessment: No/denies pain Faces Pain Scale: Hurts little more Pain Location: R ear Pain Intervention(s): Monitored during session    Home Living Family/patient expects to be discharged to:: Private residence Living Arrangements: Spouse/significant other;Other relatives(mother) Available Help at Discharge: Family;Other (Comment)(spouse commutes to [employer] for work. 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     Hand Dominance        Extremity/Trunk Assessment   Upper Extremity Assessment Upper Extremity Assessment: Defer to OT evaluation    Lower Extremity Assessment Lower Extremity Assessment: Generalized weakness       Communication   Communication: No difficulties  Cognition Arousal/Alertness: Awake/alert Behavior During Therapy: WFL for tasks assessed/performed Overall Cognitive Status: Within Functional Limits for tasks assessed                                        General Comments General comments (skin integrity, edema, etc.): Pt reporting feeling of fullness after only a couple of bites of food.     Exercises     Assessment/Plan    PT Assessment Patient needs continued PT services  PT Problem List Decreased strength;Decreased  activity tolerance;Decreased balance;Decreased mobility;Cardiopulmonary status limiting activity       PT Treatment Interventions DME instruction;Gait training;Therapeutic activities;Functional mobility training;Therapeutic exercise;Balance training;Patient/family education    PT Goals (Current goals can be found in the Care Plan section)  Acute Rehab PT Goals Patient Stated Goal: feel better PT Goal Formulation: With patient Time For Goal Achievement:  08/19/17 Potential to Achieve Goals: Good    Frequency Min 3X/week   Barriers to discharge        Co-evaluation               AM-PAC PT "6 Clicks" Daily Activity  Outcome Measure Difficulty turning over in bed (including adjusting bedclothes, sheets and blankets)?: A Little Difficulty moving from lying on back to sitting on the side of the bed? : Unable Difficulty sitting down on and standing up from a chair with arms (e.g., wheelchair, bedside commode, etc,.)?: A Little Help needed moving to and from a bed to chair (including a wheelchair)?: A Little Help needed walking in hospital room?: A Little Help needed climbing 3-5 steps with a railing? : A Lot 6 Click Score: 15    End of Session Equipment Utilized During Treatment: Oxygen Activity Tolerance: Patient limited by fatigue;Other (comment)(SOB) Patient left: in chair;with call bell/phone within reach Nurse Communication: Mobility status PT Visit Diagnosis: Other abnormalities of gait and mobility (R26.89)    Time: 0981-1914 PT Time Calculation (min) (ACUTE ONLY): 22 min   Charges:   PT Evaluation $PT Eval Moderate Complexity: 1 Mod     PT G CodesMarland Kitchen        [facility] PT [phone]   Angelina Ok [facility] 08/05/2017, 3:13 PM

## 2017-08-05 NOTE — Progress Notes (Signed)
Page to Dr Rhona Leavens  3e23. 1hr follow up CBG 409.  4H post levemir, 1H post novolog. please advise.

## 2017-08-05 NOTE — Progress Notes (Signed)
ANTICOAGULATION CONSULT NOTE   Pharmacy Consult for Heparin  Indication: chest pain/ACS  Allergies  Allergen Reactions  . Bactrim [Sulfamethoxazole-Trimethoprim] Rash  . Codeine Nausea And Vomiting  . Mobic [Meloxicam]     swelling  . Morphine And Related Nausea Only and Other (See Comments)    confusion  . Silvadene [Silver Sulfadiazine]     Red hot rash and blisters  . Adhesive [Tape] Rash    Band aid brand  laxex  . Sulfamethoxazole-Trimethoprim Rash   Patient Measurements: Height: 5\' 3"  (160 cm) Weight: 218 lb 1.6 oz (98.9 kg) IBW/kg (Calculated) : 52.4  Vital Signs: Temp: 97.5 F (36.4 C) (01/06 0425) Temp Source: Oral (01/06 0425) BP: 171/82 (01/06 0425) Pulse Rate: 76 (01/06 0425)  Labs: Recent Labs    08/02/17 1929 08/03/17 0540  08/03/17 1118 08/03/17 1829 08/04/17 0047 08/04/17 0307 08/04/17 1203 08/05/17 0455  HGB 9.9*  --   --   --   --   --  8.5*  --  8.7*  HCT 31.8*  --   --   --   --   --  27.6*  --  28.2*  PLT 449*  --   --   --   --   --  372  --  370  LABPROT  --   --   --  15.2  --   --   --   --   --   INR  --   --   --  1.21  --   --   --   --   --   HEPARINUNFRC  --   --    < > 1.22* 1.00*  --  0.47 0.39 0.18*  CREATININE 1.56* 1.60*  --   --   --   --  2.08*  --   --   TROPONINI 3.44*  --   --  2.68* 1.78* 2.43*  --   --   --    < > = values in this interval not displayed.    Estimated Creatinine Clearance: 30.6 mL/min (A) (by C-G formula based on SCr of 2.08 mg/dL (H)).   Medical History: Past Medical History:  Diagnosis Date  . Anxiety   . Arthritis   . Coronary artery disease   . Diabetes mellitus without complication (HCC)   . Diabetic peripheral neuropathy (HCC)   . Eczema   . GERD (gastroesophageal reflux disease)   . H/O hiatal hernia   . Headache(784.0)    allergies, sinus  . Hyperlipidemia   . Hypertension   . Myocardial infarction (HCC)   . PONV (postoperative nausea and vomiting)   . Shortness of breath   .  Sleep apnea   . Umbilical hernia     Assessment: 67 y/o F transfer from outside hospital with NSTEMI, now on heparin drip, heparin level low this AM, no issues per RN.   Goal of Therapy:  Heparin level 0.3-0.7 units/ml Monitor platelets by anticoagulation protocol: Yes   Plan:  Inc heparin to 650 units/hr 1400 HL  Abran [facility] 08/05/2017,6:12 AM

## 2017-08-05 NOTE — Progress Notes (Signed)
Inpatient Diabetes Program Recommendations  AACE/ADA: New Consensus Statement on Inpatient Glycemic Control (2015)  Target Ranges:  Prepandial:   less than 140 mg/dL      Peak postprandial:   less than 180 mg/dL (1-2 hours)      Critically ill patients:  140 - 180 mg/dL   Lab Results  Component Value Date   GLUCAP 150 (H) 08/05/2017   HGBA1C 8.7 (H) 04/09/2013    Review of Glycemic Control  Results for JAMETTE, CARELLI (MRN [id number]) as of 08/05/2017 09:08  Ref. Range 08/04/2017 20:07 08/04/2017 21:33 08/05/2017 00:15 08/05/2017 04:14 08/05/2017 07:46  Glucose-Capillary Latest Ref Range: 65 - 99 mg/dL 615 (H) 379 (H) 432 (H) 106 (H) 150 (H)    Diabetes history:Type 1 DM Outpatient Diabetes medications: Insulin pump: Medtronic 12-7a- 1.25 units/hr 7a-12 MN-1. 75 units/hr 1 unit/15 grams CHO 1 unit for every 50 mg/dL greater than 761 mg/dL  Current orders for Inpatient glycemic control:Novolog sensitive q 4 hours,  Novolog 3 units tid, 10 units of Levemir bid  Inpatient Diabetes Program Recommendations:  Blood sugars very high yesterday as a result of not getting Levemir yesterday morning- because she has Type 1 diabetes, it is imperative she get her insulin.  RN- please strongly encourage patient to take insulins as ordered.  Susette Racer, RN, BA, MHA, CDE Diabetes Coordinator Inpatient Diabetes Program  [phone] (Team Pager)  08/05/2017 9:13 AM

## 2017-08-05 NOTE — Progress Notes (Addendum)
Progress Note  Patient Name: Kiara [name] Date of Encounter: 08/05/2017  Primary Cardiologist: followed in [place], [place]  Subjective   Ongoing SOB  Inpatient Medications    Scheduled Meds: . aspirin EC  81 mg Oral Daily  . atorvastatin  80 mg Oral q1800  . ciprofloxacin-dexamethasone  4 drop Right EAR BID  . clonazePAM  1 mg Oral QHS  . fluticasone  1 spray Each Nare Daily  . insulin aspart  0-9 Units Subcutaneous Q4H  . insulin aspart  3 Units Subcutaneous TID WC  . insulin detemir  10 Units Subcutaneous BID  . mouth rinse  15 mL Mouth Rinse BID  . metoprolol succinate  25 mg Oral Daily  . sodium chloride flush  3 mL Intravenous Q12H   Continuous Infusions: . sodium chloride    . heparin 650 Units/hr (08/05/17 0622)  . piperacillin-tazobactam (ZOSYN)  IV 3.375 g (08/05/17 0606)   PRN Meds: sodium chloride, acetaminophen, hydrocortisone cream, ondansetron (ZOFRAN) IV, sodium chloride, sodium chloride flush   Vital Signs    Vitals:   08/04/17 0642 08/04/17 1100 08/04/17 2007 08/05/17 0425  BP: (!) 130/54 (!) 123/53 133/86 (!) 171/82  Pulse: 75 79 81 76  Resp: 18  18 18   Temp: (!) 97.5 F (36.4 C)  98.6 F (37 C) (!) 97.5 F (36.4 C)  TempSrc: Oral  Oral Oral  SpO2: 98%  97% 96%  Weight: 218 lb (98.9 kg)   218 lb 1.6 oz (98.9 kg)  Height:        Intake/Output Summary (Last 24 hours) at 08/05/2017 0751 Last data filed at 08/05/2017 0513 Gross per 24 hour  Intake 1124.31 ml  Output 1810 ml  Net -685.69 ml   Filed Weights   08/03/17 0417 08/04/17 0642 08/05/17 0425  Weight: 203 lb 0.7 oz (92.1 kg) 218 lb (98.9 kg) 218 lb 1.6 oz (98.9 kg)    Telemetry    SR - Personally Reviewed  ECG    n/a  Physical Exam   GEN: No acute distress.   Neck: elevated JVD Cardiac: RRR, no murmurs, rubs, or gallops.  Respiratory: Clear to auscultation bilaterally. GI: Soft, nontender, non-distended  MS: 1-2+ bilateral LE edema; No deformity. Neuro:  Nonfocal  Psych:  Normal affect   Labs    Chemistry Recent Labs  Lab 08/02/17 1929 08/03/17 0540 08/04/17 0307 08/05/17 0455  NA 134* 136 139 134*  K 4.9 4.6 5.0 4.0  CL 99* 101 98* 96*  CO2 25 28 31 26   GLUCOSE 102* 129* 131* 113*  BUN 30* 31* 39* 37*  CREATININE 1.56* 1.60* 2.08* 1.73*  CALCIUM 8.5* 8.3* 8.5* 8.6*  PROT 6.5  --   --   --   ALBUMIN 2.5*  --   --   --   AST 34  --   --   --   ALT 33  --   --   --   ALKPHOS 139*  --   --   --   BILITOT 0.5  --   --   --   GFRNONAA 34* 33* 24* 30*  GFRAA 39* 38* 28* 35*  ANIONGAP 10 7 10 12      Hematology Recent Labs  Lab 08/02/17 1929 08/04/17 0307 08/05/17 0455  WBC 12.8* 9.9 9.2  RBC 3.34* 2.88* 2.98*  HGB 9.9* 8.5* 8.7*  HCT 31.8* 27.6* 28.2*  MCV 95.2 95.8 94.6  MCH 29.6 29.5 29.2  MCHC 31.1 30.8 30.9  RDW 15.7*  15.4 15.3  PLT 449* 372 370    Cardiac Enzymes Recent Labs  Lab 08/02/17 1929 08/03/17 1118 08/03/17 1829 08/04/17 0047  TROPONINI 3.44* 2.68* 1.78* 2.43*   No results for input(s): TROPIPOC in the last 168 hours.   BNP Recent Labs  Lab 08/02/17 1929  BNP 1,347.3*     DDimer No results for input(s): DDIMER in the last 168 hours.   Radiology    Dg Chest Port 1 View  Result Date: 08/04/2017 CLINICAL DATA:  Dyspnea EXAM: PORTABLE CHEST 1 VIEW COMPARISON:  08/02/2017 FINDINGS: Cardiomegaly with pulmonary vascular congestion and suspected mild interstitial edema, although improved. Small to moderate bilateral pleural effusions, left greater than right. Postsurgical changes related to prior CABG.  Median sternotomy. IMPRESSION: Cardiomegaly with mild interstitial edema, improved. Small to moderate bilateral pleural effusions, left greater than right. Electronically Signed   By: Charline Bills M.D.   On: 08/04/2017 10:53    Cardiac Studies     Patient Profile        67 y.o.femalewith PMH of CAD s/p CABG (SVG-PDA, LIMA-LAD) '14, HTN, HL, DM, OSA on Cpap who presented to [facility] with ear pain  and mastoiditis. Developed acute HF and positive troponins.    Assessment & Plan    1. Acute on chronic combined systolic/diastolic HF - [facility] echo per report LVEF 30-35%, down from 40-45% from 2014 echo. - patient presented to [facility] with mastoiditis, developed CHF with IVFs - negative 690 mL yesterday, negative 2.5 liters since admission. Had significant uptrend in Cr, she received lasix 60mg  IV x 1 yesterday and night dose was held. Cr trending down today.Not much data on her baseline Cr.  Weights appear inaccurate.  - CXR yesterday with mild edema that is improved, small to moderate effusions.  - medical therapy with ASA 81, atorva 80, hep gtt, Toprol 25. No ACE/ARB/ARN/aldactone due to poor renal function.  - remains fluid overloaded, redose lasix IV 60mg  x1 today, reevalaute tomorrow.    2. NSTEMI/CAD s/p CABG (SVG-PDA, LIMA-LAD) '14 - has not had chest pain, trop up and down but peak 3.4 - plan for cath Monday pending volume status and renal function - medical therapy as reported above  -ongoing SOB, orthopnea, and renal dysfunction and some developing anemia. Im not sure she will be ready for cath tomorrow, but will make npo tonight. Due to uncertainty, I have not placed cath orders at this time.   3. Anemia - trending down Hgb, continue to monitor, she is on hep drip. No frank blood loss  4. HTN - elevated this AM, overall trends have been ok. Follow as she receives her AM meds   For questions or updates, please contact [facility] Please consult [email or web address] for contact info under Cardiology/STEMI.      Kiara Coddington, MD  08/05/2017, 7:51 AM

## 2017-08-05 NOTE — Evaluation (Signed)
Occupational Therapy Evaluation Patient Details Name: Kiara [name] MRN: [id number] DOB: 1951/01/28 Today's Date: 08/05/2017    History of Present Illness 67 y.o. female with PMH of CAD s/p CABG (SVG-PDA, LIMA-LAD) '14, HTN, HL, DM, OSA on Cpap who presented to [facility] with ear pain and mastoiditis. Developed acute HF and positive troponins.   Clinical Impression   Pt admitted with the above diagnoses and presents with below problem list. Pt will benefit from continued acute OT to address the below listed deficits and maximize independence with basic ADLs prior to d/c home. 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 Pt presents with significant decreased activity tolerance impacting ADLs. Currently supervision to min guard with UB ADLs, functional transfers and in-room mobility; likely min A for LB ADLs. Will follow.      Follow Up Recommendations  Home health OT;Supervision - Intermittent    Equipment Recommendations  None recommended by OT    Recommendations for Other Services       Precautions / Restrictions Precautions Precaution Comments: fatigues quickly Restrictions Weight Bearing Restrictions: No      Mobility Bed Mobility               General bed mobility comments: up in chair  Transfers Overall transfer level: Needs assistance Equipment used: None Transfers: Sit to/from Stand Sit to Stand: Min guard         General transfer comment: to/from recliner. min guard for safety    Balance Overall balance assessment: Needs assistance         Standing balance support: Single extremity supported;During functional activity Standing balance-Leahy Scale: Fair                             ADL either performed or assessed with clinical judgement   ADL Overall ADL's : Needs assistance/impaired Eating/Feeding: Set up;Sitting   Grooming: Set up;Min guard;Sitting;Standing   Upper Body Bathing: Set up    Lower Body Bathing: Minimal assistance;Sit to/from stand   Upper Body Dressing : Set up;Sitting   Lower Body Dressing: Min guard;Sit to/from stand   Toilet Transfer: Min guard;Ambulation;RW   Toileting- Clothing Manipulation and Hygiene: Supervision/safety;Sitting/lateral lean;Sit to/from stand   Tub/ Shower Transfer: Walk-in shower;Min guard;Ambulation   Functional mobility during ADLs: Min guard General ADL Comments: Pt ambulated recliner to sink and stood to complete 1 grooming task. Fatigued quickly and returned to recliner.      Vision         Perception     Praxis      Pertinent Vitals/Pain Pain Assessment: Faces Faces Pain Scale: Hurts little more Pain Location: R ear Pain Intervention(s): Monitored during session     Hand Dominance     Extremity/Trunk Assessment Upper Extremity Assessment Upper Extremity Assessment: Overall WFL for tasks assessed   Lower Extremity Assessment Lower Extremity Assessment: Defer to PT evaluation       Communication Communication Communication: No difficulties   Cognition Arousal/Alertness: Awake/alert Behavior During Therapy: WFL for tasks assessed/performed Overall Cognitive Status: Within Functional Limits for tasks assessed                                     General Comments  Pt reporting feeling of fullness after only a couple of bites of food.     Exercises     Shoulder  Instructions      Home Living Family/patient expects to be discharged to:: Private residence Living Arrangements: Spouse/significant other;Other relatives(mother) Available Help at Discharge: Family;Other (Comment)(spouse commutes to [place] for work. ) Type of Home: House Home Access: Stairs to enter Secretary/administrator of Steps: 1   Home Layout: One level     Bathroom Shower/Tub: Runner, broadcasting/film/video: Information systems manager - built in          Prior Functioning/Environment Level of Independence:  Independent        Comments: caregiver to mother        OT Problem List: Decreased activity tolerance;Impaired balance (sitting and/or standing);Decreased knowledge of use of DME or AE;Decreased knowledge of precautions;Cardiopulmonary status limiting activity;Pain;Increased edema      OT Treatment/Interventions: Self-care/ADL training;DME and/or AE instruction;Therapeutic activities;Patient/family education;Balance training    OT Goals(Current goals can be found in the care plan section) Acute Rehab OT Goals Patient Stated Goal: feel better OT Goal Formulation: With patient Time For Goal Achievement: 08/19/17 Potential to Achieve Goals: Good ADL Goals Pt Will Perform Grooming: with set-up;standing;with supervision Pt Will Perform Upper Body Bathing: with set-up;sitting Pt Will Perform Lower Body Bathing: with supervision;sit to/from stand Pt Will Perform Upper Body Dressing: with set-up;sitting Pt Will Perform Lower Body Dressing: with min guard assist;sit to/from stand Pt Will Transfer to Toilet: with modified independence;ambulating Pt Will Perform Toileting - Clothing Manipulation and hygiene: with supervision;sit to/from stand  OT Frequency: Min 2X/week   Barriers to D/C:            Co-evaluation              AM-PAC PT "6 Clicks" Daily Activity     Outcome Measure Help from another person eating meals?: None Help from another person taking care of personal grooming?: A Little Help from another person toileting, which includes using toliet, bedpan, or urinal?: A Little Help from another person bathing (including washing, rinsing, drying)?: A Little Help from another person to put on and taking off regular upper body clothing?: None Help from another person to put on and taking off regular lower body clothing?: A Little 6 Click Score: 20   End of Session Equipment Utilized During Treatment: Oxygen  Activity Tolerance: Patient limited by fatigue Patient left:  in chair;with call bell/phone within reach  OT Visit Diagnosis: Other abnormalities of gait and mobility (R26.89);Other (comment)(cardiovascular)                Time: 1243-1300 OT Time Calculation (min): 17 min Charges:  OT General Charges $OT Visit: 1 Visit OT Evaluation $OT Eval Low Complexity: 1 Low G-Codes:       Pilar Grammes 08/05/2017, 1:24 PM

## 2017-08-06 ENCOUNTER — Inpatient Hospital Stay (HOSPITAL_COMMUNITY)
Admission: AD | Disposition: A | Discharge status: Home / Self Care | Payer: Self-pay | Source: Other Acute Inpatient Hospital | Attending: Internal Medicine

## 2017-08-06 HISTORY — PX: RIGHT/LEFT HEART CATH AND CORONARY/GRAFT ANGIOGRAPHY: CATH118267

## 2017-08-06 LAB — POCT I-STAT 3, ART BLOOD GAS (G3+)
Bicarbonate: 23.5 mmol/L (ref 20.0–28.0)
O2 Saturation: 92 %
PH ART: 7.465 — AB (ref 7.350–7.450)
TCO2: 25 mmol/L (ref 22–32)
pCO2 arterial: 32.7 mmHg (ref 32.0–48.0)
pO2, Arterial: 60 mmHg — ABNORMAL LOW (ref 83.0–108.0)

## 2017-08-06 LAB — CBC
HCT: 31.5 % — ABNORMAL LOW (ref 36.0–46.0)
Hemoglobin: 9.7 g/dL — ABNORMAL LOW (ref 12.0–15.0)
MCH: 29 pg (ref 26.0–34.0)
MCHC: 30.8 g/dL (ref 30.0–36.0)
MCV: 94.3 fL (ref 78.0–100.0)
PLATELETS: 402 10*3/uL — AB (ref 150–400)
RBC: 3.34 MIL/uL — ABNORMAL LOW (ref 3.87–5.11)
RDW: 15.2 % (ref 11.5–15.5)
WBC: 11.5 10*3/uL — AB (ref 4.0–10.5)

## 2017-08-06 LAB — POCT I-STAT 3, VENOUS BLOOD GAS (G3P V)
ACID-BASE EXCESS: 4 mmol/L — AB (ref 0.0–2.0)
BICARBONATE: 28.2 mmol/L — AB (ref 20.0–28.0)
O2 Saturation: 50 %
PO2 VEN: 26 mmHg — AB (ref 32.0–45.0)
TCO2: 29 mmol/L (ref 22–32)
pCO2, Ven: 40.9 mmHg — ABNORMAL LOW (ref 44.0–60.0)
pH, Ven: 7.447 — ABNORMAL HIGH (ref 7.250–7.430)

## 2017-08-06 LAB — BASIC METABOLIC PANEL
Anion gap: 11 (ref 5–15)
Anion gap: 14 (ref 5–15)
BUN: 31 mg/dL — AB (ref 6–20)
BUN: 25 mg/dL — ABNORMAL HIGH (ref 6–20)
CALCIUM: 8.7 mg/dL — AB (ref 8.9–10.3)
CALCIUM: 9.2 mg/dL (ref 8.9–10.3)
CHLORIDE: 95 mmol/L — AB (ref 101–111)
CO2: 27 mmol/L (ref 22–32)
CO2: 26 mmol/L (ref 22–32)
CREATININE: 1.56 mg/dL — AB (ref 0.44–1.00)
CREATININE: 1.5 mg/dL — AB (ref 0.44–1.00)
Chloride: 98 mmol/L — ABNORMAL LOW (ref 101–111)
GFR calc Af Amer: 39 mL/min — ABNORMAL LOW (ref >60)
GFR, EST AFRICAN AMERICAN: 41 mL/min — AB (ref >60)
GFR, EST NON AFRICAN AMERICAN: 34 mL/min — AB (ref >60)
GFR, EST NON AFRICAN AMERICAN: 36 mL/min — AB (ref >60)
Glucose, Bld: 164 mg/dL — ABNORMAL HIGH (ref 65–99)
Glucose, Bld: 292 mg/dL — ABNORMAL HIGH (ref 65–99)
Potassium: 4 mmol/L (ref 3.5–5.1)
Potassium: 4.4 mmol/L (ref 3.5–5.1)
SODIUM: 136 mmol/L (ref 135–145)
SODIUM: 135 mmol/L (ref 135–145)

## 2017-08-06 LAB — GLUCOSE, CAPILLARY
GLUCOSE-CAPILLARY: 152 mg/dL — AB (ref 65–99)
Glucose-Capillary: 134 mg/dL — ABNORMAL HIGH (ref 65–99)
Glucose-Capillary: 162 mg/dL — ABNORMAL HIGH (ref 65–99)
Glucose-Capillary: 265 mg/dL — ABNORMAL HIGH (ref 65–99)

## 2017-08-06 LAB — POCT ACTIVATED CLOTTING TIME: ACTIVATED CLOTTING TIME: 142 s

## 2017-08-06 LAB — MRSA PCR SCREENING: MRSA BY PCR: NEGATIVE

## 2017-08-06 LAB — HEPARIN LEVEL (UNFRACTIONATED): Heparin Unfractionated: 0.24 IU/mL — ABNORMAL LOW (ref 0.30–0.70)

## 2017-08-06 SURGERY — RIGHT/LEFT HEART CATH AND CORONARY/GRAFT ANGIOGRAPHY
Anesthesia: LOCAL

## 2017-08-06 MED ORDER — FUROSEMIDE 10 MG/ML IJ SOLN
INTRAMUSCULAR | Status: AC
  Filled 2017-08-06: qty 4

## 2017-08-06 MED ORDER — SODIUM CHLORIDE 0.9% FLUSH: 3.0000 mL | INTRAVENOUS | Status: DC | PRN

## 2017-08-06 MED ORDER — LIDOCAINE HCL (PF) 1 % IJ SOLN
INTRAMUSCULAR | Status: DC | PRN
  Administered 2017-08-06: 20 mL via SUBCUTANEOUS

## 2017-08-06 MED ORDER — LIDOCAINE HCL (PF) 1 % IJ SOLN
INTRAMUSCULAR | Status: AC
  Filled 2017-08-06: qty 30

## 2017-08-06 MED ORDER — CLOPIDOGREL BISULFATE 300 MG PO TABS
ORAL_TABLET | ORAL | Status: AC
  Filled 2017-08-06: qty 2

## 2017-08-06 MED ORDER — INSULIN ASPART 100 UNIT/ML ~~LOC~~ SOLN
0.0000 [IU] | Freq: Three times a day (TID) | SUBCUTANEOUS | Status: DC
  Administered 2017-08-06: 5 [IU] via SUBCUTANEOUS
  Administered 2017-08-07: 3 [IU] via SUBCUTANEOUS
  Administered 2017-08-07: 9 [IU] via SUBCUTANEOUS
  Administered 2017-08-07: 5 [IU] via SUBCUTANEOUS
  Administered 2017-08-08: 3 [IU] via SUBCUTANEOUS
  Administered 2017-08-08: 2 [IU] via SUBCUTANEOUS
  Administered 2017-08-09: 5 [IU] via SUBCUTANEOUS
  Administered 2017-08-09: 2 [IU] via SUBCUTANEOUS

## 2017-08-06 MED ORDER — SODIUM CHLORIDE 0.9% FLUSH: 3.0000 mL | Freq: Two times a day (BID) | INTRAVENOUS | Status: DC

## 2017-08-06 MED ORDER — FUROSEMIDE 10 MG/ML IJ SOLN
INTRAMUSCULAR | Status: DC | PRN
  Administered 2017-08-06: 40 mg via INTRAVENOUS

## 2017-08-06 MED ORDER — PIPERACILLIN-TAZOBACTAM 3.375 G IVPB
3.3750 g | Freq: Three times a day (TID) | INTRAVENOUS | Status: DC
  Administered 2017-08-07 – 2017-08-08 (×4): 3.375 g via INTRAVENOUS
  Filled 2017-08-06 (×5): qty 50

## 2017-08-06 MED ORDER — SODIUM CHLORIDE 0.9 % IV SOLN
Freq: Once | INTRAVENOUS | Status: AC
  Administered 2017-08-06: 10:00:00 via INTRAVENOUS

## 2017-08-06 MED ORDER — FUROSEMIDE 10 MG/ML IJ SOLN
80.0000 mg | Freq: Two times a day (BID) | INTRAMUSCULAR | Status: DC
  Administered 2017-08-06 – 2017-08-07 (×3): 80 mg via INTRAVENOUS
  Filled 2017-08-06 (×3): qty 8

## 2017-08-06 MED ORDER — SODIUM CHLORIDE 0.9 % IV SOLN: 250.0000 mL | INTRAVENOUS | Status: DC | PRN

## 2017-08-06 MED ORDER — SODIUM CHLORIDE 0.9% FLUSH
3.0000 mL | Freq: Two times a day (BID) | INTRAVENOUS | Status: DC
  Administered 2017-08-06: 10 mL via INTRAVENOUS
  Administered 2017-08-07 – 2017-08-09 (×4): 3 mL via INTRAVENOUS

## 2017-08-06 MED ORDER — INSULIN ASPART 100 UNIT/ML ~~LOC~~ SOLN
5.0000 [IU] | Freq: Three times a day (TID) | SUBCUTANEOUS | Status: DC
  Administered 2017-08-06 – 2017-08-08 (×6): 5 [IU] via SUBCUTANEOUS

## 2017-08-06 MED ORDER — CLOPIDOGREL BISULFATE 300 MG PO TABS
ORAL_TABLET | ORAL | Status: DC | PRN
  Administered 2017-08-06: 600 mg via ORAL

## 2017-08-06 MED ORDER — INSULIN ASPART 100 UNIT/ML ~~LOC~~ SOLN
0.0000 [IU] | Freq: Every day | SUBCUTANEOUS | Status: DC
  Administered 2017-08-06 – 2017-08-08 (×2): 4 [IU] via SUBCUTANEOUS

## 2017-08-06 MED ORDER — HEPARIN (PORCINE) IN NACL 2-0.9 UNIT/ML-% IJ SOLN
INTRAMUSCULAR | Status: AC
  Filled 2017-08-06: qty 1000

## 2017-08-06 MED ORDER — CLOPIDOGREL BISULFATE 75 MG PO TABS
75.0000 mg | ORAL_TABLET | Freq: Every day | ORAL | Status: DC
  Administered 2017-08-07 – 2017-08-09 (×3): 75 mg via ORAL
  Filled 2017-08-06 (×3): qty 1

## 2017-08-06 MED ORDER — INSULIN DETEMIR 100 UNIT/ML ~~LOC~~ SOLN
12.0000 [IU] | Freq: Two times a day (BID) | SUBCUTANEOUS | Status: DC
  Administered 2017-08-06 – 2017-08-08 (×4): 12 [IU] via SUBCUTANEOUS
  Filled 2017-08-06 (×5): qty 0.12

## 2017-08-06 MED ORDER — IOPAMIDOL (ISOVUE-370) INJECTION 76%
INTRAVENOUS | Status: DC | PRN
  Administered 2017-08-06: 110 mL via INTRA_ARTERIAL

## 2017-08-06 MED ORDER — CLOPIDOGREL BISULFATE 300 MG PO TABS: 600.0000 mg | ORAL_TABLET | Freq: Once | ORAL | Status: DC

## 2017-08-06 SURGICAL SUPPLY — 15 items
CATH INFINITI 5 FR 3DRC (CATHETERS) ×2 IMPLANT
CATH INFINITI 5 FR IM (CATHETERS) ×2 IMPLANT
CATH INFINITI 5 FR MPA2 (CATHETERS) ×2 IMPLANT
CATH INFINITI 5 FR RCB (CATHETERS) ×2 IMPLANT
CATH INFINITI 5FR AL1 (CATHETERS) ×2 IMPLANT
CATH INFINITI 5FR MULTPACK ANG (CATHETERS) ×2 IMPLANT
CATH SWAN GANZ 7F STRAIGHT (CATHETERS) ×2 IMPLANT
COVER PRB 48X5XTLSCP FOLD TPE (BAG) ×1 IMPLANT
COVER PROBE 5X48 (BAG) ×1
KIT HEART LEFT (KITS) ×2 IMPLANT
PACK CARDIAC CATHETERIZATION (CUSTOM PROCEDURE TRAY) ×2 IMPLANT
SHEATH PINNACLE 5F 10CM (SHEATH) ×2 IMPLANT
SHEATH PINNACLE 7F 10CM (SHEATH) ×2 IMPLANT
TRANSDUCER W/STOPCOCK (MISCELLANEOUS) ×2 IMPLANT
WIRE EMERALD 3MM-J .035X150CM (WIRE) ×2 IMPLANT

## 2017-08-06 NOTE — Progress Notes (Signed)
29fr sheath was removed from RFA and 22fr sheath was removed RFV using manual pressure.  Pressure was held for 20 minutes without incident.  No oozing or hematoma was noted before or after the hold and the site is currently unremarkable.  A tegaderm was applied to the site and post care instructions were given to the patient.  Patient tolerated well.  PVS: BP 142/54, SATS 97%, HR 88.

## 2017-08-06 NOTE — Progress Notes (Signed)
ANTICOAGULATION CONSULT NOTE  Pharmacy Consult for Heparin  Indication: chest pain/ACS  Allergies  Allergen Reactions  . Bactrim [Sulfamethoxazole-Trimethoprim] Rash  . Codeine Nausea And Vomiting  . Mobic [Meloxicam]     swelling  . Morphine And Related Nausea Only and Other (See Comments)    confusion  . Silvadene [Silver Sulfadiazine]     Red hot rash and blisters  . Adhesive [Tape] Rash    Band aid brand  laxex  . Sulfamethoxazole-Trimethoprim Rash   Patient Measurements: Height: 5\' 3"  (160 cm) Weight: 218 lb 1.6 oz (98.9 kg) IBW/kg (Calculated) : 52.4  Vital Signs: Temp: 98.1 F (36.7 C) (01/06 2009) Temp Source: Oral (01/06 2009) BP: 155/66 (01/06 2009) Pulse Rate: 85 (01/06 2009)  Labs: Recent Labs    08/03/17 0540  08/03/17 1118 08/03/17 1829 08/04/17 0047 08/04/17 0307  08/05/17 0455 08/05/17 1346 08/06/17 0040  HGB  --   --   --   --   --  8.5*  --  8.7*  --   --   HCT  --   --   --   --   --  27.6*  --  28.2*  --   --   PLT  --   --   --   --   --  372  --  370  --   --   LABPROT  --   --  15.2  --   --   --   --   --   --   --   INR  --   --  1.21  --   --   --   --   --   --   --   HEPARINUNFRC  --    < > 1.22* 1.00*  --  0.47   < > 0.18* 0.17* 0.24*  CREATININE 1.60*  --   --   --   --  2.08*  --  1.73*  --   --   TROPONINI  --   --  2.68* 1.78* 2.43*  --   --   --   --   --    < > = values in this interval not displayed.    Estimated Creatinine Clearance: 36.8 mL/min (A) (by C-G formula based on SCr of 1.73 mg/dL (H)).  Assessment: 67 y.o. female with NSTEMI for heparin   Goal of Therapy:  Heparin level 0.3-0.7 units/ml Monitor platelets by anticoagulation protocol: Yes   Plan:  Increase Heparin 1050 units/hr Follow up after cath today   Geannie Risen, PharmD, BCPS

## 2017-08-06 NOTE — H&P (View-Only) (Signed)
Progress Note  Patient Name: Kiara [name] Date of Encounter: 08/06/2017  Primary Cardiologist: Octavio Manns  Subjective   Feeling much better this morning. Breathing has significantly improved.  Inpatient Medications    Scheduled Meds: . aspirin EC  81 mg Oral Daily  . atorvastatin  80 mg Oral q1800  . ciprofloxacin-dexamethasone  4 drop Right EAR BID  . clonazePAM  1 mg Oral QHS  . fluticasone  1 spray Each Nare Daily  . insulin aspart  0-9 Units Subcutaneous Q4H  . insulin aspart  3 Units Subcutaneous TID WC  . insulin detemir  10 Units Subcutaneous BID  . mouth rinse  15 mL Mouth Rinse BID  . metoprolol succinate  25 mg Oral Daily  . sodium chloride flush  3 mL Intravenous Q12H   Continuous Infusions: . sodium chloride    . heparin 1,050 Units/hr (08/06/17 0215)  . piperacillin-tazobactam (ZOSYN)  IV Stopped (08/06/17 0236)   PRN Meds: sodium chloride, acetaminophen, hydrocortisone cream, ondansetron (ZOFRAN) IV, sodium chloride, sodium chloride flush   Vital Signs    Vitals:   08/05/17 0951 08/05/17 1225 08/05/17 2009 08/06/17 0410  BP: (!) 129/56 (!) 150/64 (!) 155/66 (!) 151/41  Pulse: 85 78 85 81  Resp:  18 18 18   Temp:  98.5 F (36.9 C) 98.1 F (36.7 C) 97.8 F (36.6 C)  TempSrc:  Oral Oral Oral  SpO2:  99% 97% 100%  Weight:    209 lb 9.6 oz (95.1 kg)  Height:        Intake/Output Summary (Last 24 hours) at 08/06/2017 0852 Last data filed at 08/06/2017 0835 Gross per 24 hour  Intake 1305.46 ml  Output 2900 ml  Net -1594.54 ml   Filed Weights   08/04/17 0642 08/05/17 0425 08/06/17 0410  Weight: 218 lb (98.9 kg) 218 lb 1.6 oz (98.9 kg) 209 lb 9.6 oz (95.1 kg)    Telemetry    SR - Personally Reviewed  Physical Exam   General: Pleasant obese older W female appearing in no acute distress. Remains on [place].  Head: Normocephalic, atraumatic.  Neck: Supple, no JVD. Lungs:  Resp regular and unlabored, Diminished in lower lobes. Heart: RRR, S1, S2, no  S3, S4, or murmur; no rub. Abdomen: Soft, non-tender, non-distended with normoactive bowel sounds.  Extremities: No clubbing, cyanosis, 1+ pitting LE edema. Distal pedal pulses are 2+ bilaterally. Neuro: Alert and oriented X 3. Moves all extremities spontaneously. Psych: Normal affect.  Labs    Chemistry Recent Labs  Lab 08/02/17 1929  08/04/17 0307 08/05/17 0455 08/06/17 0153  NA 134*   < > 139 134* 136  K 4.9   < > 5.0 4.0 4.0  CL 99*   < > 98* 96* 98*  CO2 25   < > 31 26 27   GLUCOSE 102*   < > 131* 113* 164*  BUN 30*   < > 39* 37* 31*  CREATININE 1.56*   < > 2.08* 1.73* 1.56*  CALCIUM 8.5*   < > 8.5* 8.6* 8.7*  PROT 6.5  --   --   --   --   ALBUMIN 2.5*  --   --   --   --   AST 34  --   --   --   --   ALT 33  --   --   --   --   ALKPHOS 139*  --   --   --   --   BILITOT 0.5  --   --   --   --  GFRNONAA 34*   < > 24* 30* 34*  GFRAA 39*   < > 28* 35* 39*  ANIONGAP 10   < > 10 12 11    < > = values in this interval not displayed.     Hematology Recent Labs  Lab 08/04/17 0307 08/05/17 0455 08/06/17 0153  WBC 9.9 9.2 11.5*  RBC 2.88* 2.98* 3.34*  HGB 8.5* 8.7* 9.7*  HCT 27.6* 28.2* 31.5*  MCV 95.8 94.6 94.3  MCH 29.5 29.2 29.0  MCHC 30.8 30.9 30.8  RDW 15.4 15.3 15.2  PLT 372 370 402*    Cardiac Enzymes Recent Labs  Lab 08/02/17 1929 08/03/17 1118 08/03/17 1829 08/04/17 0047  TROPONINI 3.44* 2.68* 1.78* 2.43*   No results for input(s): TROPIPOC in the last 168 hours.   BNP Recent Labs  Lab 08/02/17 1929  BNP 1,347.3*     DDimer No results for input(s): DDIMER in the last 168 hours.    Radiology    Dg Chest Port 1 View  Result Date: 08/04/2017 CLINICAL DATA:  Dyspnea EXAM: PORTABLE CHEST 1 VIEW COMPARISON:  08/02/2017 FINDINGS: Cardiomegaly with pulmonary vascular congestion and suspected mild interstitial edema, although improved. Small to moderate bilateral pleural effusions, left greater than right. Postsurgical changes related to prior CABG.   Median sternotomy. IMPRESSION: Cardiomegaly with mild interstitial edema, improved. Small to moderate bilateral pleural effusions, left greater than right. Electronically Signed   By: Charline Bills M.D.   On: 08/04/2017 10:53    Cardiac Studies   N/a  Patient Profile     67 y.o. female with PMH of CAD s/p CABG (SVG-PDA, LIMA-LAD) '14, HTN, HL, DM, OSA on Cpap who presented to [facility] with ear pain and mastoiditis. Developed acute HF and positive troponins.   Assessment & Plan    1. Acute on Chronic Combined HF: Echo per [facility] showed a drop in EF from 40 to 30%. Was given IVFs at OSH for mastoiditis and developed CHF. She has diuresed with IV lasix, 2.9L UOP yesterday. Weight is trending down. Breathing has significantly improved this morning. -- on BB, no ACE/ARB given elevated Cr. Still with some volume overload noted on exam. Will need further diuresis but has been held this morning with plans for cardiac cath.  2. NSTEMI: Troponin peaked at 3.44. No chest pain. Remains on IV heparin. Planned for [facility] today.   3. CAD sp CABG (SVG-PDA, LIMA- LAD) '14: No chest pain. Given fall in EF and troponins. Planned for cath today.  -- on BB, statin, ASA  4. HTN: Borderline controlled. May need further adjustment.   5. HL: on statin  6. Anemia: Stable this morning.   Signed, Laverda Page, NP  08/06/2017, 8:52 AM  Pager # [phone]   For questions or updates, please contact [facility] Please consult [email or web address] for contact info under Cardiology/STEMI.  Patient seen, examined. Available data reviewed. Agree with findings, assessment, and plan as outlined by Laverda Page, NP. On exam, she is alert, oriented in NAD. JVP elevated. Heart RRR no murmur. Lungs CTA, abdomen soft, NT. Extremities with 2+ edema bilaterally. Data reviewed - pt s/p CABG 2014 with LIMA-LAD and SVG-PDA. Now with NSTEMI in setting of acute on chronic heart failure/iatrogenic pulmonary edema  (IV fluids x 48 hours).  Plan for R/L heart cath today.  I have reviewed the risks, indications, and alternatives to cardiac catheterization, possible angioplasty, and stenting with the patient. Risks include but are not limited to bleeding, infection, vascular injury,  stroke, myocardial infection, arrhythmia, kidney injury, radiation-related injury in the case of prolonged fluoroscopy use, emergency cardiac surgery, and death. The patient understands the risks of serious complication is 1-2 in 1000 with diagnostic cardiac cath and 1-2% or less with angioplasty/stenting.   Will go ahead and add plavix 600 mg loading dose now and 75 mg daily.    Echo shows LVEF 30-35% on outside echo study.   Tonny Bollman, M.D. 08/06/2017 9:46 AM

## 2017-08-06 NOTE — Care Management Important Message (Signed)
Important Message  Patient Details  Name: Kiara [name] MRN: [id number] Date of Birth: 10-03-1950   Medicare Important Message Given:  Yes    Lahoma Constantin Stefan Church 08/06/2017, 1:39 PM

## 2017-08-06 NOTE — Brief Op Note (Signed)
BRIEF CARDIAC CATHETERIZATION NOTE  DATE: 08/06/2017 TIME: 12:00 PM  PATIENT:  Kiara [name]  67 y.o. female  PRE-OPERATIVE DIAGNOSIS: Acute systolic heart failure, NSTEMI  POST-OPERATIVE DIAGNOSIS: Same  PROCEDURE:  Procedure(s): RIGHT/LEFT HEART CATH AND CORONARY/GRAFT ANGIOGRAPHY (N/A)  SURGEON:  Surgeon(s) and Role:    Yvonne Kendall, MD - Primary  FINDINGS: 1. Severe native CAD with diffusely diseased, subtotally occluded LAD, as well as chronic total occlusions of OM1 and mid RCA. 2. Patent LIMA->LAD, though LAD is occluded distal to LIMA anastomosis. 3. Patent SVG->RPDA, though graft cannot be selectively engaged and is incompletely visualized. 4. Severely elevated left and right heart filling pressures. 5. Low normal cardiac output.  RECOMMENDATIONS: 1. Transfer to ICU for aggressive diuresis and NIPPV if respiratory status worsens. 2. Consider heart failure consult if patient does not improve with aggressive diuresis.  Yvonne Kendall, MD [facility] Pager: [phone]

## 2017-08-06 NOTE — Interval H&P Note (Signed)
History and Physical Interval Note:  08/06/2017 10:06 AM  Kiara [name]  has presented today for cardiac catheterization, with the diagnosis of NSTEMI and acute systolic heart failure. The various methods of treatment have been discussed with the patient and family. After consideration of risks, benefits and other options for treatment, the patient has consented to  Procedure(s): RIGHT/LEFT HEART CATH AND CORONARY/GRAFT ANGIOGRAPHY (N/A) as a surgical intervention .  The patient's history has been reviewed, patient examined, no change in status, stable for surgery.  I have reviewed the patient's chart and labs.  Questions were answered to the patient's satisfaction.    Cath Lab Visit (complete for each Cath Lab visit)  Clinical Evaluation Leading to the Procedure:   ACS: Yes.    Non-ACS:  N/A  [name]

## 2017-08-06 NOTE — Progress Notes (Signed)
Inpatient Diabetes Program Recommendations  AACE/ADA: New Consensus Statement on Inpatient Glycemic Control (2015)  Target Ranges:  Prepandial:   less than 140 mg/dL      Peak postprandial:   less than 180 mg/dL (1-2 hours)      Critically ill patients:  140 - 180 mg/dL   Lab Results  Component Value Date   GLUCAP 152 (H) 08/06/2017   HGBA1C 8.7 (H) 04/09/2013    Review of Glycemic ControlResults for Kiara [name], Kiara [name] (MRN [id number]) as of 08/06/2017 12:41  Ref. Range 08/05/2017 07:46 08/05/2017 12:23 08/05/2017 13:27 08/05/2017 15:33 08/05/2017 17:07 08/05/2017 20:03 08/06/2017 00:17 08/06/2017 04:58 08/06/2017 07:37  Glucose-Capillary Latest Ref Range: 65 - 99 mg/dL 211 (H) 155 (H) 208 (H) 316 (H) 172 (H) 162 (H) 162 (H) 134 (H) 152 (H)    Diabetes history: Type 1 DM Outpatient Diabetes medications:  Insulin pump: Medtronic 12-7a- 1.25 units/hr 7a-12 MN-1. 75 units/hr 1 unit/15 grams CHO 1 unit for every 50 mg/dL greater than 022 mg/dL Current orders for Inpatient glycemic control:  Novolog sensitive q 4 hours, Novolog 3 units tid with meals, Levemir 10 units bid  Inpatient Diabetes Program Recommendations:    Please consider increasing Levemir to 12 units bid.  Also consider changing Novolog correction to tid with meals and HS scale.  Please increase Novolog meal coverage to 5 units tid with meals.  Sent text page to MD.   Thanks,  Beryl Meager, RN, BC-ADM Inpatient Diabetes Coordinator Pager [phone] (8a-5p)

## 2017-08-06 NOTE — Progress Notes (Signed)
PROGRESS NOTE    Kiara [name]  NWG:[id number] DOB: [birth year] DOA: 08/02/2017 PCP: Majel Homer, MD    Brief Narrative:  67 y.o. female with medical history significant of CAD s/p CABG in 2014 here at [facility] following cardiologist in [place], DM on insulin pump, amputation was hospitalized on 12/28 at [facility] for acute mastoiditis due to pseudomonas, has been on cipro ear drops and cipro iv since and cipro iv changed to zosyn iv yesterday for unclear reason.  She feels her ear is improved.  However since her admission, she has been becoming more sob and has gained significant weight up to 221 lbs from 173 lbs.  She denies any cough.  No chest pain.  Swelling diffusely and starting to get blisters to her left leg from the swelling.  She has no h/o CHF in her past.  She was put on lasix 40mg  iv q 12 hours 2 days ago and has not improved.  Her cardiologist there was called who advised outpt cath.  She and her family were  Not happy with this and requested transfer to [facility].  Her trop went up to 1.4 and bnp was 115000.  She also had an echo there which showed no wall motion at the apex with EF in the 30s.  Pt transferred here after discussion with our cardiology team for cardiac evaluation   Assessment & Plan:   Principal Problem:   Acute CHF (congestive heart failure) (HCC) Active Problems:   Type 1 diabetes (HCC)   Essential hypertension   Anxiety   Coronary atherosclerosis due to severely calcified coronary lesion   PVD (peripheral vascular disease) (HCC)   S/P CABG x 2   NSTEMI (non-ST elevated myocardial infarction) (HCC)   CHF (congestive heart failure) (HCC)   Acute respiratory failure with hypoxia (HCC)   SOB (shortness of breath)  Principal Problem:   Acute CHF (congestive heart failure) (HCC) -Patient had been continued on  iv lasix. -Cardiology following -Clinically volume overloaded -Continuing to diurese per Cardiology  Active Problems:   NSTEMI (non-ST elevated myocardial  infarction) (HCC) -Presently chest pain free -Cardiology recs noted -Patient is now s/p heart cath with findings of severe native CAD with subtotally occluded LAD as well as chronic total occlusions of OM1 and mid RCA    Acute respiratory failure with hypoxia (HCC) -Likely due to edema -Continue to diurese per Cardiology    Type 1 diabetes (HCC) -Continue on ssi sensitive -Continue insulin per current regimen -Changes noted per diabetic coordinator    Essential hypertension -Blood pressure currently stable at present -continue with current regimen    Anxiety -held benzos at time of admission due to hypoxia -Tolerating anxiolytic at night    Coronary atherosclerosis due to severely calcified coronary lesion -Patient underwent heart cath 1/7 per above    PVD (peripheral vascular disease) (HCC) - presently stable    OM/mastoiditits on right  -Patient had been continued on cipro ear drops and zosyn   DVT prophylaxis: heparin infusion Code Status: Full Family Communication: Pt in room, family not at bedside Disposition Plan: Uncertain at this time  Consultants:   Cardiology  Procedures:     Antimicrobials: Anti-infectives (From admission, onward)   Start     Dose/Rate Route Frequency Ordered Stop   08/02/17 2230  piperacillin-tazobactam (ZOSYN) IVPB 3.375 g     3.375 g 12.5 mL/hr over 240 Minutes Intravenous Every 8 hours 08/02/17 2205        Subjective: No complaints at this time  Objective: Vitals:   08/06/17 1300 08/06/17 1342 08/06/17 1400 08/06/17 1415  BP: (!) 143/56 139/66 (!) 141/71 (!) 139/92  Pulse: 89   70  Resp: (!) 28 (!) 23 (!) 23 20  Temp:      TempSrc:      SpO2: 98% 97% 97% 96%  Weight:      Height:        Intake/Output Summary (Last 24 hours) at 08/06/2017 1515 Last data filed at 08/06/2017 0835 Gross per 24 hour  Intake 483.6 ml  Output 1900 ml  Net -1416.4 ml   Filed Weights   08/04/17 0642 08/05/17 0425 08/06/17 0410  Weight:  98.9 kg (218 lb) 98.9 kg (218 lb 1.6 oz) 95.1 kg (209 lb 9.6 oz)    Examination: General exam: Conversant, in no acute distress Respiratory system: normal chest rise, clear, no audible wheezing Cardiovascular system: regular rhythm, s1-s2 Gastrointestinal system: Nondistended, nontender, pos BS Central nervous system: No seizures, no tremors Extremities: No cyanosis, no joint deformities Skin: No rashes, no pallor Psychiatry: Affect normal // no auditory hallucinations   Data Reviewed: I have personally reviewed following labs and imaging studies  CBC: Recent Labs  Lab 08/02/17 1929 08/04/17 0307 08/05/17 0455 08/06/17 0153  WBC 12.8* 9.9 9.2 11.5*  NEUTROABS 8.9*  --   --   --   HGB 9.9* 8.5* 8.7* 9.7*  HCT 31.8* 27.6* 28.2* 31.5*  MCV 95.2 95.8 94.6 94.3  PLT 449* 372 370 402*   Basic Metabolic Panel: Recent Labs  Lab 08/02/17 1929 08/03/17 0540 08/04/17 0307 08/05/17 0455 08/06/17 0153  NA 134* 136 139 134* 136  K 4.9 4.6 5.0 4.0 4.0  CL 99* 101 98* 96* 98*  CO2 25 28 31 26 27   GLUCOSE 102* 129* 131* 113* 164*  BUN 30* 31* 39* 37* 31*  CREATININE 1.56* 1.60* 2.08* 1.73* 1.56*  CALCIUM 8.5* 8.3* 8.5* 8.6* 8.7*   GFR: Estimated Creatinine Clearance: 40 mL/min (A) (by C-G formula based on SCr of 1.56 mg/dL (H)). Liver Function Tests: Recent Labs  Lab 08/02/17 1929  AST 34  ALT 33  ALKPHOS 139*  BILITOT 0.5  PROT 6.5  ALBUMIN 2.5*   No results for input(s): LIPASE, AMYLASE in the last 168 hours. No results for input(s): AMMONIA in the last 168 hours. Coagulation Profile: Recent Labs  Lab 08/03/17 1118  INR 1.21   Cardiac Enzymes: Recent Labs  Lab 08/02/17 1929 08/03/17 1118 08/03/17 1829 08/04/17 0047  TROPONINI 3.44* 2.68* 1.78* 2.43*   BNP (last 3 results) No results for input(s): PROBNP in the last 8760 hours. HbA1C: No results for input(s): HGBA1C in the last 72 hours. CBG: Recent Labs  Lab 08/05/17 1707 08/05/17 2003  08/06/17 0017 08/06/17 0458 08/06/17 0737  GLUCAP 172* 162* 162* 134* 152*   Lipid Profile: No results for input(s): CHOL, HDL, LDLCALC, TRIG, CHOLHDL, LDLDIRECT in the last 72 hours. Thyroid Function Tests: No results for input(s): TSH, T4TOTAL, FREET4, T3FREE, THYROIDAB in the last 72 hours. Anemia Panel: No results for input(s): VITAMINB12, FOLATE, FERRITIN, TIBC, IRON, RETICCTPCT in the last 72 hours. Sepsis Labs: No results for input(s): PROCALCITON, LATICACIDVEN in the last 168 hours.  No results found for this or any previous visit (from the past 240 hour(s)).   Radiology Studies: No results found.  Scheduled Meds: . aspirin EC  81 mg Oral Daily  . atorvastatin  80 mg Oral q1800  . ciprofloxacin-dexamethasone  4 drop Right EAR BID  .  clonazePAM  1 mg Oral QHS  . [START ON 08/07/2017] clopidogrel  75 mg Oral Daily  . fluticasone  1 spray Each Nare Daily  . furosemide  80 mg Intravenous BID  . insulin aspart  0-5 Units Subcutaneous QHS  . insulin aspart  0-9 Units Subcutaneous TID WC  . insulin aspart  5 Units Subcutaneous TID WC  . insulin detemir  12 Units Subcutaneous BID  . mouth rinse  15 mL Mouth Rinse BID  . metoprolol succinate  25 mg Oral Daily  . sodium chloride flush  3 mL Intravenous Q12H  . sodium chloride flush  3 mL Intravenous Q12H   Continuous Infusions: . sodium chloride    . sodium chloride    . piperacillin-tazobactam (ZOSYN)  IV Stopped (08/06/17 0236)     LOS: 4 days   Rickey Barbara, MD [facility] Pager [phone]  If 7PM-7AM, please contact night-coverage [email or web address] Password [email or web address] 08/06/2017, 3:15 PM

## 2017-08-06 NOTE — Progress Notes (Signed)
 Progress Note  Patient Name: Kiara [name] Date of Encounter: 08/06/2017  Primary Cardiologist: [place]  Subjective   Feeling much better this morning. Breathing has significantly improved.  Inpatient Medications    Scheduled Meds: . aspirin EC  81 mg Oral Daily  . atorvastatin  80 mg Oral q1800  . ciprofloxacin-dexamethasone  4 drop Right EAR BID  . clonazePAM  1 mg Oral QHS  . fluticasone  1 spray Each Nare Daily  . insulin aspart  0-9 Units Subcutaneous Q4H  . insulin aspart  3 Units Subcutaneous TID WC  . insulin detemir  10 Units Subcutaneous BID  . mouth rinse  15 mL Mouth Rinse BID  . metoprolol succinate  25 mg Oral Daily  . sodium chloride flush  3 mL Intravenous Q12H   Continuous Infusions: . sodium chloride    . heparin 1,050 Units/hr (08/06/17 0215)  . piperacillin-tazobactam (ZOSYN)  IV Stopped (08/06/17 0236)   PRN Meds: sodium chloride, acetaminophen, hydrocortisone cream, ondansetron (ZOFRAN) IV, sodium chloride, sodium chloride flush   Vital Signs    Vitals:   08/05/17 0951 08/05/17 1225 08/05/17 2009 08/06/17 0410  BP: (!) 129/56 (!) 150/64 (!) 155/66 (!) 151/41  Pulse: 85 78 85 81  Resp:  18 18 18  Temp:  98.5 F (36.9 C) 98.1 F (36.7 C) 97.8 F (36.6 C)  TempSrc:  Oral Oral Oral  SpO2:  99% 97% 100%  Weight:    209 lb 9.6 oz (95.1 kg)  Height:        Intake/Output Summary (Last 24 hours) at 08/06/2017 0852 Last data filed at 08/06/2017 0835 Gross per 24 hour  Intake 1305.46 ml  Output 2900 ml  Net -1594.54 ml   Filed Weights   08/04/17 0642 08/05/17 0425 08/06/17 0410  Weight: 218 lb (98.9 kg) 218 lb 1.6 oz (98.9 kg) 209 lb 9.6 oz (95.1 kg)    Telemetry    SR - Personally Reviewed  Physical Exam   General: Pleasant obese older W female appearing in no acute distress. Remains on [place].  Head: Normocephalic, atraumatic.  Neck: Supple, no JVD. Lungs:  Resp regular and unlabored, Diminished in lower lobes. Heart: RRR, S1, S2, no  S3, S4, or murmur; no rub. Abdomen: Soft, non-tender, non-distended with normoactive bowel sounds.  Extremities: No clubbing, cyanosis, 1+ pitting LE edema. Distal pedal pulses are 2+ bilaterally. Neuro: Alert and oriented X 3. Moves all extremities spontaneously. Psych: Normal affect.  Labs    Chemistry Recent Labs  Lab 08/02/17 1929  08/04/17 0307 08/05/17 0455 08/06/17 0153  NA 134*   < > 139 134* 136  K 4.9   < > 5.0 4.0 4.0  CL 99*   < > 98* 96* 98*  CO2 25   < > 31 26 27  GLUCOSE 102*   < > 131* 113* 164*  BUN 30*   < > 39* 37* 31*  CREATININE 1.56*   < > 2.08* 1.73* 1.56*  CALCIUM 8.5*   < > 8.5* 8.6* 8.7*  PROT 6.5  --   --   --   --   ALBUMIN 2.5*  --   --   --   --   AST 34  --   --   --   --   ALT 33  --   --   --   --   ALKPHOS 139*  --   --   --   --   BILITOT 0.5  --   --   --   --     GFRNONAA 34*   < > 24* 30* 34*  GFRAA 39*   < > 28* 35* 39*  ANIONGAP 10   < > 10 12 11   < > = values in this interval not displayed.     Hematology Recent Labs  Lab 08/04/17 0307 08/05/17 0455 08/06/17 0153  WBC 9.9 9.2 11.5*  RBC 2.88* 2.98* 3.34*  HGB 8.5* 8.7* 9.7*  HCT 27.6* 28.2* 31.5*  MCV 95.8 94.6 94.3  MCH 29.5 29.2 29.0  MCHC 30.8 30.9 30.8  RDW 15.4 15.3 15.2  PLT 372 370 402*    Cardiac Enzymes Recent Labs  Lab 08/02/17 1929 08/03/17 1118 08/03/17 1829 08/04/17 0047  TROPONINI 3.44* 2.68* 1.78* 2.43*   No results for input(s): TROPIPOC in the last 168 hours.   BNP Recent Labs  Lab 08/02/17 1929  BNP 1,347.3*     DDimer No results for input(s): DDIMER in the last 168 hours.    Radiology    Dg Chest Port 1 View  Result Date: 08/04/2017 CLINICAL DATA:  Dyspnea EXAM: PORTABLE CHEST 1 VIEW COMPARISON:  08/02/2017 FINDINGS: Cardiomegaly with pulmonary vascular congestion and suspected mild interstitial edema, although improved. Small to moderate bilateral pleural effusions, left greater than right. Postsurgical changes related to prior CABG.   Median sternotomy. IMPRESSION: Cardiomegaly with mild interstitial edema, improved. Small to moderate bilateral pleural effusions, left greater than right. Electronically Signed   By: Sriyesh  Krishnan M.D.   On: 08/04/2017 10:53    Cardiac Studies   N/a  Patient Profile     66 y.o. female with PMH of CAD s/p CABG (SVG-PDA, LIMA-LAD) '14, HTN, HL, DM, OSA on Cpap who presented to [facility] with ear pain and mastoiditis. Developed acute HF and positive troponins.   Assessment & Plan    1. Acute on Chronic Combined HF: Echo per [facility] showed a drop in EF from 40 to 30%. Was given IVFs at OSH for mastoiditis and developed CHF. She has diuresed with IV lasix, 2.9L UOP yesterday. Weight is trending down. Breathing has significantly improved this morning. -- on BB, no ACE/ARB given elevated Cr. Still with some volume overload noted on exam. Will need further diuresis but has been held this morning with plans for cardiac cath.  2. NSTEMI: Troponin peaked at 3.44. No chest pain. Remains on IV heparin. Planned for R/LHC today.   3. CAD sp CABG (SVG-PDA, LIMA- LAD) '14: No chest pain. Given fall in EF and troponins. Planned for cath today.  -- on BB, statin, ASA  4. HTN: Borderline controlled. May need further adjustment.   5. HL: on statin  6. Anemia: Stable this morning.   Signed, Lindsay Roberts, NP  08/06/2017, 8:52 AM  Pager # [phone]   For questions or updates, please contact [facility] Please consult [email or web address] for contact info under Cardiology/STEMI.  Patient seen, examined. Available data reviewed. Agree with findings, assessment, and plan as outlined by Lindsay Roberts, NP. On exam, she is alert, oriented in NAD. JVP elevated. Heart RRR no murmur. Lungs CTA, abdomen soft, NT. Extremities with 2+ edema bilaterally. Data reviewed - pt s/p CABG 2014 with LIMA-LAD and SVG-PDA. Now with NSTEMI in setting of acute on chronic heart failure/iatrogenic pulmonary edema  (IV fluids x 48 hours).  Plan for R/L heart cath today.  I have reviewed the risks, indications, and alternatives to cardiac catheterization, possible angioplasty, and stenting with the patient. Risks include but are not limited to bleeding, infection, vascular injury,   stroke, myocardial infection, arrhythmia, kidney injury, radiation-related injury in the case of prolonged fluoroscopy use, emergency cardiac surgery, and death. The patient understands the risks of serious complication is 1-2 in 1000 with diagnostic cardiac cath and 1-2% or less with angioplasty/stenting.   Will go ahead and add plavix 600 mg loading dose now and 75 mg daily.    Echo shows LVEF 30-35% on outside echo study.   Kyrillos Adams, M.D. 08/06/2017 9:46 AM      

## 2017-08-06 NOTE — Progress Notes (Signed)
Pharmacy Antibiotic Note  Kiara [name] is a 67 y.o. female with a history of chronic otitis media s/p recent ear wick removal. Pharmacy dosing Zosyn mastoiditis. SCr down to 1.56. WBC 11.5.    Plan: Continue Zosyn EID 3.375gm IV Q8H. F/u LOT Monitor renal fxn, clinical progress   Height: 5\' 3"  (160 cm) Weight: 209 lb 9.6 oz (95.1 kg)(scale b) IBW/kg (Calculated) : 52.4  Temp (24hrs), Avg:98 F (36.7 C), Min:97.8 F (36.6 C), Max:98.1 F (36.7 C)  Recent Labs  Lab 08/02/17 1929 08/03/17 0540 08/04/17 0307 08/05/17 0455 08/06/17 0153  WBC 12.8*  --  9.9 9.2 11.5*  CREATININE 1.56* 1.60* 2.08* 1.73* 1.56*    Estimated Creatinine Clearance: 40 mL/min (A) (by C-G formula based on SCr of 1.56 mg/dL (H)).    Allergies  Allergen Reactions  . Bactrim [Sulfamethoxazole-Trimethoprim] Rash  . Codeine Nausea And Vomiting  . Mobic [Meloxicam]     swelling  . Morphine And Related Nausea Only and Other (See Comments)    confusion  . Silvadene [Silver Sulfadiazine]     Red hot rash and blisters  . Adhesive [Tape] Rash    Band aid brand  laxex  . Sulfamethoxazole-Trimethoprim Rash     Azith 12/28 > 12/28 CTX 12/28 > 12/28 Cipro 12/28 PTA Zosyn 1/3 >>   Vinnie Level, PharmD., BCPS Clinical Pharmacist Pager [phone]

## 2017-08-06 NOTE — Progress Notes (Signed)
CPAP set up and pat placed on home nasal pillows/tubing. 8.0 cm H20 for pt. Comfort. 2 LPM O2 bleed in. Tolerating well at this time. RN aware.

## 2017-08-06 NOTE — Progress Notes (Signed)
Pt headed to cath lab premed given, consent signed tele off. CCMD called

## 2017-08-06 NOTE — Progress Notes (Signed)
OT Cancellation Note  Patient Details Name: Kiara [name] MRN: [id number] DOB: February 14, 1951   Cancelled Treatment:    Reason Eval/Treat Not Completed: Patient at procedure or test/ unavailable. Pt off unit for cardiac cath  Galen Manila 08/06/2017, 10:42 AM

## 2017-08-07 ENCOUNTER — Encounter (HOSPITAL_COMMUNITY): Payer: Self-pay | Admitting: Internal Medicine

## 2017-08-07 DIAGNOSIS — I5023 Acute on chronic systolic (congestive) heart failure: Secondary | ICD-10-CM

## 2017-08-07 DIAGNOSIS — R748 Abnormal levels of other serum enzymes: Secondary | ICD-10-CM

## 2017-08-07 LAB — GLUCOSE, CAPILLARY
GLUCOSE-CAPILLARY: 307 mg/dL — AB (ref 65–99)
GLUCOSE-CAPILLARY: 263 mg/dL — AB (ref 65–99)
Glucose-Capillary: 233 mg/dL — ABNORMAL HIGH (ref 65–99)
Glucose-Capillary: 361 mg/dL — ABNORMAL HIGH (ref 65–99)
Glucose-Capillary: 171 mg/dL — ABNORMAL HIGH (ref 65–99)

## 2017-08-07 LAB — BASIC METABOLIC PANEL
Anion gap: 11 (ref 5–15)
BUN: 28 mg/dL — AB (ref 6–20)
CHLORIDE: 96 mmol/L — AB (ref 101–111)
CO2: 28 mmol/L (ref 22–32)
CREATININE: 1.49 mg/dL — AB (ref 0.44–1.00)
Calcium: 9 mg/dL (ref 8.9–10.3)
GFR, EST AFRICAN AMERICAN: 42 mL/min — AB (ref >60)
GFR, EST NON AFRICAN AMERICAN: 36 mL/min — AB (ref >60)
Glucose, Bld: 226 mg/dL — ABNORMAL HIGH (ref 65–99)
POTASSIUM: 3.9 mmol/L (ref 3.5–5.1)
SODIUM: 135 mmol/L (ref 135–145)

## 2017-08-07 LAB — CBC
HCT: 33.2 % — ABNORMAL LOW (ref 36.0–46.0)
Hemoglobin: 10.2 g/dL — ABNORMAL LOW (ref 12.0–15.0)
MCH: 28.9 pg (ref 26.0–34.0)
MCHC: 30.7 g/dL (ref 30.0–36.0)
MCV: 94.1 fL (ref 78.0–100.0)
Platelets: 410 10*3/uL — ABNORMAL HIGH (ref 150–400)
RBC: 3.53 MIL/uL — AB (ref 3.87–5.11)
RDW: 15.3 % (ref 11.5–15.5)
WBC: 10.9 10*3/uL — AB (ref 4.0–10.5)

## 2017-08-07 MED FILL — Heparin Sodium (Porcine) 2 Unit/ML in Sodium Chloride 0.9%: INTRAMUSCULAR | Qty: 500 | Status: AC

## 2017-08-07 NOTE — Progress Notes (Signed)
Inpatient Diabetes Program Recommendations  AACE/ADA: New Consensus Statement on Inpatient Glycemic Control (2015)  Target Ranges:  Prepandial:   less than 140 mg/dL      Peak postprandial:   less than 180 mg/dL (1-2 hours)      Critically ill patients:  140 - 180 mg/dL   Lab Results  Component Value Date   GLUCAP 361 (H) 08/07/2017   HGBA1C 8.7 (H) 04/09/2013    Review of Glycemic ControlResults for Kiara [name], Kiara [name] (MRN [id number]) as of 08/07/2017 13:03  Ref. Range 08/06/2017 07:37 08/06/2017 16:34 08/06/2017 22:40 08/07/2017 07:44 08/07/2017 11:14  Glucose-Capillary Latest Ref Range: 65 - 99 mg/dL 628 (H) 366 (H) 294 (H) 233 (H) 361 (H)    Diabetes history: Type 1 DM Outpatient Diabetes medications:  Insulin pump: Medtronic 12-7a- 1.25 units/hr 7a-12 MN-1. 75 units/hr 1 unit/15 grams CHO 1 unit for every 50 mg/dL greater than 765 mg/dL Current orders for Inpatient glycemic control:  Novolog sensitive tid with meals and HS, Novolog 5 units tid with meals, Levemir 12 units bid  Inpatient Diabetes Program Recommendations:    Please consider increasing Levemir to 15 units bid.  Also still seems to need more meal coverage.  Consider increasing Novolog meal coverage to 7 units tid with meals.   Thanks,  Beryl Meager, RN, BC-ADM Inpatient Diabetes Coordinator Pager [phone] (8a-5p)

## 2017-08-07 NOTE — Progress Notes (Signed)
PT Cancellation Note  Patient Details Name: Kiara [name] MRN: [id number] DOB: 06/06/1951   Cancelled Treatment:    Reason Eval/Treat Not Completed: Other (comment). Pt politely declining PT treatment session this evening, stating she just got up to bathroom with RN and finally settled in bed needing rest. Will follow-up tomorrow.  [name], PT, DPT Acute Rehab Services  Pager: [phone]  [name] 08/07/2017, 5:22 PM

## 2017-08-07 NOTE — Progress Notes (Signed)
PROGRESS NOTE    Kiara [name]  DDU:[id number] DOB: 1951/01/09 DOA: 08/02/2017 PCP: Majel Homer, MD    Brief Narrative:  68 y.o. female with medical history significant of CAD s/p CABG in 2014 here at [facility] following cardiologist in [place], DM on insulin pump, amputation was hospitalized on 12/28 at [facility] for acute mastoiditis due to pseudomonas, has been on cipro ear drops and cipro iv since and cipro iv changed to zosyn iv yesterday for unclear reason.  She feels her ear is improved.  However since her admission, she has been becoming more sob and has gained significant weight up to 221 lbs from 173 lbs.  She denies any cough.  No chest pain.  Swelling diffusely and starting to get blisters to her left leg from the swelling.  She has no h/o CHF in her past.  She was put on lasix 40mg  iv q 12 hours 2 days ago and has not improved.  Her cardiologist there was called who advised outpt cath.  She and her family were  Not happy with this and requested transfer to [facility].  Her trop went up to 1.4 and bnp was 115000.  She also had an echo there which showed no wall motion at the apex with EF in the 30s.  Pt transferred here after discussion with our cardiology team for cardiac evaluation   Assessment & Plan:   Principal Problem:   Acute CHF (congestive heart failure) (HCC) Active Problems:   Type 1 diabetes (HCC)   Essential hypertension   Anxiety   Coronary atherosclerosis due to severely calcified coronary lesion   PVD (peripheral vascular disease) (HCC)   S/P CABG x 2   NSTEMI (non-ST elevated myocardial infarction) (HCC)   CHF (congestive heart failure) (HCC)   Acute respiratory failure with hypoxia (HCC)   SOB (shortness of breath)  Principal Problem:   Acute CHF (congestive heart failure) (HCC) -Patient had been continued on  iv lasix. -Cardiology following -Clinically still volume overloaded -Continue diuresis per Cardiology  Active Problems:   NSTEMI (non-ST elevated myocardial  infarction) (HCC) -Presently chest pain free -Cardiology recs noted -Patient is now s/p heart cath with findings of severe native CAD with subtotally occluded LAD as well as chronic total occlusions of OM1 and mid RCA -Stable at present    Acute respiratory failure with hypoxia (HCC) -Likely due to edema -Continue to diurese per Cardiology per above    Type 1 diabetes (HCC) -Continue on ssi sensitive -Continue insulin per current regimen -Changes noted per diabetic coordinator, changes were made    Essential hypertension -Blood pressure currently stable at present -continue with current regimen    Anxiety -held benzos at time of admission due to hypoxia -Tolerating anxiolytic at night    Coronary atherosclerosis due to severely calcified coronary lesion -Patient underwent heart cath 1/7 per above    PVD (peripheral vascular disease) (HCC) - presently stable    OM/mastoiditits on right  -Patient had been continued on cipro ear drops and zosyn -Stable at present   DVT prophylaxis: heparin infusion Code Status: Full Family Communication: Pt in room, family not at bedside Disposition Plan: Uncertain at this time  Consultants:   Cardiology  Procedures:     Antimicrobials: Anti-infectives (From admission, onward)   Start     Dose/Rate Route Frequency Ordered Stop   08/06/17 0200  piperacillin-tazobactam (ZOSYN) IVPB 3.375 g     3.375 g 12.5 mL/hr over 240 Minutes Intravenous Every 8 hours 08/06/17 2223  08/02/17 2230  piperacillin-tazobactam (ZOSYN) IVPB 3.375 g  Status:  Discontinued     3.375 g 12.5 mL/hr over 240 Minutes Intravenous Every 8 hours 08/02/17 2205 08/06/17 2223      Subjective: Without complaints currenlty  Objective: Vitals:   08/07/17 1031 08/07/17 1100 08/07/17 1116 08/07/17 1200  BP: (!) 119/57 (!) 132/49  (!) 119/49  Pulse: 90     Resp:  16  16  Temp:   98.2 F (36.8 C)   TempSrc:   Oral   SpO2:  94%  93%  Weight:      Height:         Intake/Output Summary (Last 24 hours) at 08/07/2017 1502 Last data filed at 08/07/2017 1419 Gross per 24 hour  Intake 510 ml  Output 3025 ml  Net -2515 ml   Filed Weights   08/05/17 0425 08/06/17 0410 08/07/17 0600  Weight: 98.9 kg (218 lb 1.6 oz) 95.1 kg (209 lb 9.6 oz) 91.1 kg (200 lb 13.4 oz)    Examination: General exam: Awake, laying in bed, in nad Respiratory system: Normal respiratory effort, no wheezing Cardiovascular system: regular rate, s1, s2 Gastrointestinal system: Soft, nondistended, positive BS Central nervous system: CN2-12 grossly intact, strength intact Extremities: Perfused, no clubbing Skin: Normal skin turgor, no notable skin lesions seen Psychiatry: Mood normal // no visual hallucinations   Data Reviewed: I have personally reviewed following labs and imaging studies  CBC: Recent Labs  Lab 08/02/17 1929 08/04/17 0307 08/05/17 0455 08/06/17 0153 08/07/17 0206  WBC 12.8* 9.9 9.2 11.5* 10.9*  NEUTROABS 8.9*  --   --   --   --   HGB 9.9* 8.5* 8.7* 9.7* 10.2*  HCT 31.8* 27.6* 28.2* 31.5* 33.2*  MCV 95.2 95.8 94.6 94.3 94.1  PLT 449* 372 370 402* 410*   Basic Metabolic Panel: Recent Labs  Lab 08/04/17 0307 08/05/17 0455 08/06/17 0153 08/06/17 1822 08/07/17 0206  NA 139 134* 136 135 135  K 5.0 4.0 4.0 4.4 3.9  CL 98* 96* 98* 95* 96*  CO2 31 26 27 26 28   GLUCOSE 131* 113* 164* 292* 226*  BUN 39* 37* 31* 25* 28*  CREATININE 2.08* 1.73* 1.56* 1.50* 1.49*  CALCIUM 8.5* 8.6* 8.7* 9.2 9.0   GFR: Estimated Creatinine Clearance: 40.9 mL/min (A) (by C-G formula based on SCr of 1.49 mg/dL (H)). Liver Function Tests: Recent Labs  Lab 08/02/17 1929  AST 34  ALT 33  ALKPHOS 139*  BILITOT 0.5  PROT 6.5  ALBUMIN 2.5*   No results for input(s): LIPASE, AMYLASE in the last 168 hours. No results for input(s): AMMONIA in the last 168 hours. Coagulation Profile: Recent Labs  Lab 08/03/17 1118  INR 1.21   Cardiac Enzymes: Recent Labs  Lab  08/02/17 1929 08/03/17 1118 08/03/17 1829 08/04/17 0047  TROPONINI 3.44* 2.68* 1.78* 2.43*   BNP (last 3 results) No results for input(s): PROBNP in the last 8760 hours. HbA1C: No results for input(s): HGBA1C in the last 72 hours. CBG: Recent Labs  Lab 08/06/17 0737 08/06/17 1634 08/06/17 2240 08/07/17 0744 08/07/17 1114  GLUCAP 152* 265* 307* 233* 361*   Lipid Profile: No results for input(s): CHOL, HDL, LDLCALC, TRIG, CHOLHDL, LDLDIRECT in the last 72 hours. Thyroid Function Tests: No results for input(s): TSH, T4TOTAL, FREET4, T3FREE, THYROIDAB in the last 72 hours. Anemia Panel: No results for input(s): VITAMINB12, FOLATE, FERRITIN, TIBC, IRON, RETICCTPCT in the last 72 hours. Sepsis Labs: No results for input(s): PROCALCITON, LATICACIDVEN in  the last 168 hours.  Recent Results (from the past 240 hour(s))  MRSA PCR Screening     Status: None   Collection Time: 08/06/17  1:31 PM  Result Value Ref Range Status   MRSA by PCR NEGATIVE NEGATIVE Final    Comment:        The GeneXpert MRSA Assay (FDA approved for NASAL specimens only), is one component of a comprehensive MRSA colonization surveillance program. It is not intended to diagnose MRSA infection nor to guide or monitor treatment for MRSA infections.      Radiology Studies: No results found.  Scheduled Meds: . aspirin EC  81 mg Oral Daily  . atorvastatin  80 mg Oral q1800  . ciprofloxacin-dexamethasone  4 drop Right EAR BID  . clonazePAM  1 mg Oral QHS  . clopidogrel  75 mg Oral Daily  . fluticasone  1 spray Each Nare Daily  . furosemide  80 mg Intravenous BID  . insulin aspart  0-5 Units Subcutaneous QHS  . insulin aspart  0-9 Units Subcutaneous TID WC  . insulin aspart  5 Units Subcutaneous TID WC  . insulin detemir  12 Units Subcutaneous BID  . mouth rinse  15 mL Mouth Rinse BID  . metoprolol succinate  25 mg Oral Daily  . sodium chloride flush  3 mL Intravenous Q12H  . sodium chloride flush   3 mL Intravenous Q12H   Continuous Infusions: . sodium chloride    . sodium chloride    . piperacillin-tazobactam (ZOSYN)  IV Stopped (08/07/17 1502)     LOS: 5 days   Rickey Barbara, MD [facility] Pager [phone]  If 7PM-7AM, please contact night-coverage [email or web address] Password [email or web address] 08/07/2017, 3:02 PM

## 2017-08-07 NOTE — Progress Notes (Signed)
Progress Note  Patient Name: Kiara [name] Date of Encounter: 08/07/2017  Primary Cardiologist: No primary care provider on file.   Subjective   She is feeling much better. Still with dyspnea when moving around but no dyspnea at rest. No chest pain.   Inpatient Medications    Scheduled Meds: . aspirin EC  81 mg Oral Daily  . atorvastatin  80 mg Oral q1800  . ciprofloxacin-dexamethasone  4 drop Right EAR BID  . clonazePAM  1 mg Oral QHS  . clopidogrel  75 mg Oral Daily  . fluticasone  1 spray Each Nare Daily  . furosemide  80 mg Intravenous BID  . insulin aspart  0-5 Units Subcutaneous QHS  . insulin aspart  0-9 Units Subcutaneous TID WC  . insulin aspart  5 Units Subcutaneous TID WC  . insulin detemir  12 Units Subcutaneous BID  . mouth rinse  15 mL Mouth Rinse BID  . metoprolol succinate  25 mg Oral Daily  . sodium chloride flush  3 mL Intravenous Q12H  . sodium chloride flush  3 mL Intravenous Q12H   Continuous Infusions: . sodium chloride    . sodium chloride    . piperacillin-tazobactam (ZOSYN)  IV Stopped (08/07/17 0600)   PRN Meds: sodium chloride, sodium chloride, acetaminophen, hydrocortisone cream, ondansetron (ZOFRAN) IV, sodium chloride, sodium chloride flush, sodium chloride flush   Vital Signs    Vitals:   08/07/17 0645 08/07/17 0700 08/07/17 0746 08/07/17 0800  BP:  129/62  (!) 132/52  Pulse:    86  Resp: (!) 21 18  17   Temp:   97.9 F (36.6 C)   TempSrc:   Oral   SpO2: 96% 95%  98%  Weight:      Height:        Intake/Output Summary (Last 24 hours) at 08/07/2017 0905 Last data filed at 08/07/2017 0600 Gross per 24 hour  Intake 460 ml  Output 3050 ml  Net -2590 ml   Filed Weights   08/05/17 0425 08/06/17 0410 08/07/17 0600  Weight: 218 lb 1.6 oz (98.9 kg) 209 lb 9.6 oz (95.1 kg) 200 lb 13.4 oz (91.1 kg)    Telemetry    sinus - Personally Reviewed  ECG    No am ekg  Physical Exam    General: Well developed, well nourished, NAD    HEENT: OP clear, mucus membranes moist  SKIN: warm, dry. No rashes. Neuro: No focal deficits  Musculoskeletal: Muscle strength 5/5 all ext  Psychiatric: Mood and affect normal  Neck: + JVD, no carotid bruits Lungs:Clear bilaterally, no wheezes, rhonci, crackles Cardiovascular: Regular rate and rhythm. No murmurs, gallops or rubs. Abdomen:Soft. Bowel sounds present. Non-tender.  Extremities: 2 + bilateral lower extremity pitting edema.     Labs    Chemistry Recent Labs  Lab 08/02/17 1929  08/06/17 0153 08/06/17 1822 08/07/17 0206  NA 134*   < > 136 135 135  K 4.9   < > 4.0 4.4 3.9  CL 99*   < > 98* 95* 96*  CO2 25   < > 27 26 28   GLUCOSE 102*   < > 164* 292* 226*  BUN 30*   < > 31* 25* 28*  CREATININE 1.56*   < > 1.56* 1.50* 1.49*  CALCIUM 8.5*   < > 8.7* 9.2 9.0  PROT 6.5  --   --   --   --   ALBUMIN 2.5*  --   --   --   --  AST 34  --   --   --   --   ALT 33  --   --   --   --   ALKPHOS 139*  --   --   --   --   BILITOT 0.5  --   --   --   --   GFRNONAA 34*   < > 34* 36* 36*  GFRAA 39*   < > 39* 41* 42*  ANIONGAP 10   < > 11 14 11    < > = values in this interval not displayed.     Hematology Recent Labs  Lab 08/05/17 0455 08/06/17 0153 08/07/17 0206  WBC 9.2 11.5* 10.9*  RBC 2.98* 3.34* 3.53*  HGB 8.7* 9.7* 10.2*  HCT 28.2* 31.5* 33.2*  MCV 94.6 94.3 94.1  MCH 29.2 29.0 28.9  MCHC 30.9 30.8 30.7  RDW 15.3 15.2 15.3  PLT 370 402* 410*    Cardiac Enzymes Recent Labs  Lab 08/02/17 1929 08/03/17 1118 08/03/17 1829 08/04/17 0047  TROPONINI 3.44* 2.68* 1.78* 2.43*   No results for input(s): TROPIPOC in the last 168 hours.   BNP Recent Labs  Lab 08/02/17 1929  BNP 1,347.3*     DDimer No results for input(s): DDIMER in the last 168 hours.   Radiology    No results found.  Cardiac Studies   Cardiac cath 08/06/16: Conclusions: 1. Severe, diffuse native coronary artery disease. 2. Widely patent LIMA-LAD with minimal runoff beyond the  distal anastomosis. 3. Patent but poorly visualized SVG-rPDA. Further attempts to engage SVG were not made due to declining respiratory status in the setting of severely elevated left and right heart pressures. 4. Severely elevated left and right heart filling pressures. 5. Severe pulmonary hypertension. 6. Low normal to reduced cardiac output.  Recommendations: 1. Transfer to cardiac ICU for aggressive diuresis and BiPAP (if needed) for shortness of breath in the setting of acute decompensated systolic heart failure. 2. Optimize evidence-based heart failure therapy. If patient does not respond well to diuresis, consultation with advanced heart failure service will need to be considered. 3. If patient develops chest pain or has continued dyspnea when euvolemic, repeat catheterization to better visualize SVG->rPDA could be considered. 4. Aggressive secondary prevention of CAD.   Patient Profile     67 y.o. female with PMH of CAD s/p CABG (SVG-PDA, LIMA-LAD) '14, HTN, HL, DM, OSA on Cpap who presented to [facility] with ear pain and mastoiditis. Developed acute HF and positive troponins   Assessment & Plan    1. Acute on chronic systolic CHF: Volume overload after aggressive hydration in outside hospital. LVEF=30% by echo. Cardiac cath 08/06/16 with diffuse native vessel CAD and patent bypasses. Severely elevated left and right heart pressures noted during cath. She is negative 2.6 liters over the last 24 hours and almost 7 liters since admission. She is still volume overloaded. -Will continue diuresis with IV Lasix today  2. CAD s/p CABG/Elevated troponin: Stable severe CAD without targets for PCI. Elevated troponin likely due to demand ischemia in setting of acute CHF. Continue ASA/plavix/statin/beta blocker.   3. Ischemic cardiomyopathy: Will continue beta blocker. Consider Entresto prior to discharge if BP and renal function will tolerate.   4. HTN: BP is controlled.   I think she  can be transferred to telemetry today. She will need several more days of diuresis.   For questions or updates, please contact [facility] Please consult [email or web address] for contact info under Cardiology/STEMI.  Signed, Verne Carrow, MD  08/07/2017, 9:05 AM

## 2017-08-08 DIAGNOSIS — F419 Anxiety disorder, unspecified: Secondary | ICD-10-CM

## 2017-08-08 DIAGNOSIS — I509 Heart failure, unspecified: Secondary | ICD-10-CM

## 2017-08-08 LAB — CBC
HEMATOCRIT: 33 % — AB (ref 36.0–46.0)
Hemoglobin: 10.3 g/dL — ABNORMAL LOW (ref 12.0–15.0)
MCH: 29.3 pg (ref 26.0–34.0)
MCHC: 31.2 g/dL (ref 30.0–36.0)
MCV: 93.8 fL (ref 78.0–100.0)
PLATELETS: 368 10*3/uL (ref 150–400)
RBC: 3.52 MIL/uL — ABNORMAL LOW (ref 3.87–5.11)
RDW: 15.3 % (ref 11.5–15.5)
WBC: 11.9 10*3/uL — ABNORMAL HIGH (ref 4.0–10.5)

## 2017-08-08 LAB — GLUCOSE, CAPILLARY
GLUCOSE-CAPILLARY: 384 mg/dL — AB (ref 65–99)
GLUCOSE-CAPILLARY: 429 mg/dL — AB (ref 65–99)
Glucose-Capillary: 222 mg/dL — ABNORMAL HIGH (ref 65–99)
Glucose-Capillary: 334 mg/dL — ABNORMAL HIGH (ref 65–99)

## 2017-08-08 LAB — BASIC METABOLIC PANEL
Anion gap: 12 (ref 5–15)
BUN: 29 mg/dL — AB (ref 6–20)
CALCIUM: 8.8 mg/dL — AB (ref 8.9–10.3)
CHLORIDE: 93 mmol/L — AB (ref 101–111)
CO2: 30 mmol/L (ref 22–32)
CREATININE: 1.65 mg/dL — AB (ref 0.44–1.00)
GFR calc non Af Amer: 32 mL/min — ABNORMAL LOW (ref >60)
GFR, EST AFRICAN AMERICAN: 37 mL/min — AB (ref >60)
GLUCOSE: 197 mg/dL — AB (ref 65–99)
Potassium: 3.4 mmol/L — ABNORMAL LOW (ref 3.5–5.1)
Sodium: 135 mmol/L (ref 135–145)

## 2017-08-08 LAB — MAGNESIUM: Magnesium: 2 mg/dL (ref 1.7–2.4)

## 2017-08-08 LAB — GLUCOSE, RANDOM: Glucose, Bld: 436 mg/dL — ABNORMAL HIGH (ref 65–99)

## 2017-08-08 MED ORDER — FUROSEMIDE 40 MG PO TABS
40.0000 mg | ORAL_TABLET | Freq: Two times a day (BID) | ORAL | Status: DC
  Administered 2017-08-08 – 2017-08-09 (×2): 40 mg via ORAL
  Filled 2017-08-08 (×2): qty 1

## 2017-08-08 MED ORDER — INSULIN ASPART 100 UNIT/ML ~~LOC~~ SOLN
7.0000 [IU] | Freq: Three times a day (TID) | SUBCUTANEOUS | Status: DC
  Administered 2017-08-08 – 2017-08-09 (×3): 7 [IU] via SUBCUTANEOUS

## 2017-08-08 MED ORDER — SPIRONOLACTONE 12.5 MG HALF TABLET
12.5000 mg | ORAL_TABLET | Freq: Every day | ORAL | Status: DC
  Administered 2017-08-08 – 2017-08-09 (×2): 12.5 mg via ORAL
  Filled 2017-08-08 (×2): qty 1

## 2017-08-08 MED ORDER — INSULIN DETEMIR 100 UNIT/ML ~~LOC~~ SOLN
15.0000 [IU] | Freq: Two times a day (BID) | SUBCUTANEOUS | Status: DC
  Administered 2017-08-08 – 2017-08-09 (×2): 15 [IU] via SUBCUTANEOUS
  Filled 2017-08-08 (×3): qty 0.15

## 2017-08-08 MED ORDER — AMMONIUM LACTATE 12 % EX LOTN
TOPICAL_LOTION | CUTANEOUS | Status: DC | PRN
  Administered 2017-08-08: 1 via TOPICAL
  Filled 2017-08-08: qty 400

## 2017-08-08 MED ORDER — POTASSIUM CHLORIDE CRYS ER 20 MEQ PO TBCR
40.0000 meq | EXTENDED_RELEASE_TABLET | ORAL | Status: AC
  Administered 2017-08-08 (×2): 40 meq via ORAL
  Filled 2017-08-08 (×2): qty 2

## 2017-08-08 MED ORDER — FAMOTIDINE 20 MG PO TABS
20.0000 mg | ORAL_TABLET | Freq: Once | ORAL | Status: AC
  Administered 2017-08-08: 20 mg via ORAL
  Filled 2017-08-08: qty 1

## 2017-08-08 NOTE — Progress Notes (Signed)
Occupational Therapy Treatment Patient Details Name: Kiara [name] MRN: [id number] DOB: Jun 08, 1951 Today's Date: 08/08/2017    History of present illness Pt is a 67 y.o. female with PMH of CAD s/p CABG (2014), HTN, HL, DM, OSA on Cpap who presented to [facility] with ear pain and mastoiditis. Developed acute HF and positive troponins. Admitted to [facility] on 08/02/16, worked up for HF and NSTEMI. S/p R/L heart cath 1/7 showing severe native CAD with diffusely diseased, subtotally occluded LAD.   OT comments  Pt demonstrating progress toward OT goals. She was able to complete functional mobility in hallway with min guard assist (approximately 200 feet with 1 therapeutic rest break) followed by toilet transfers and standing grooming tasks with supervision this session. Pt continues to fatigue easily but is demonstrating improving understanding of energy conservation strategies. D/C recommendation remains appropriate and OT will continue to follow while admitted.    Follow Up Recommendations  Home health OT;Supervision - Intermittent    Equipment Recommendations  None recommended by OT    Recommendations for Other Services      Precautions / Restrictions Precautions Precautions: Fall Precaution Comments: fatigues quickly Restrictions Weight Bearing Restrictions: No       Mobility Bed Mobility               General bed mobility comments: OOB in chair on arrival.  Transfers Overall transfer level: Needs assistance Equipment used: None Transfers: Sit to/from Stand Sit to Stand: Supervision         General transfer comment: Supervision for safety.    Balance Overall balance assessment: Needs assistance Sitting-balance support: No upper extremity supported Sitting balance-Leahy Scale: Good     Standing balance support: During functional activity;No upper extremity supported Standing balance-Leahy Scale: Fair Standing balance comment: Min guard assist for safety.                            ADL either performed or assessed with clinical judgement   ADL Overall ADL's : Needs assistance/impaired Eating/Feeding: Set up;Sitting   Grooming: Supervision/safety;Standing                   Toilet Transfer: Min guard;Ambulation;Comfort height toilet;Grab bars           Functional mobility during ADLs: Min guard General ADL Comments: Able to ambulate in hallway without assistive device. Fatigues easily and requiring standing therapeutic rest break.     Vision       Perception     Praxis      Cognition Arousal/Alertness: Awake/alert Behavior During Therapy: WFL for tasks assessed/performed Overall Cognitive Status: Within Functional Limits for tasks assessed                                          Exercises     Shoulder Instructions       General Comments Pt reports burning in L LE.    Pertinent Vitals/ Pain       Pain Assessment: No/denies pain  Home Living                                          Prior Functioning/Environment              Frequency  Min 2X/week        Progress Toward Goals  OT Goals(current goals can now be found in the care plan [name])  Progress towards OT goals: Progressing toward goals  Acute Rehab OT Goals Patient Stated Goal: Return home OT Goal Formulation: With patient Time For Goal Achievement: 08/19/17 Potential to Achieve Goals: Good  Plan Discharge plan remains appropriate    Co-evaluation                 AM-PAC PT "6 Clicks" Daily Activity     Outcome Measure   Help from another person eating meals?: None Help from another person taking care of personal grooming?: A Little Help from another person toileting, which includes using toliet, bedpan, or urinal?: A Little Help from another person bathing (including washing, rinsing, drying)?: A Little Help from another person to put on and taking off regular upper body  clothing?: None Help from another person to put on and taking off regular lower body clothing?: A Little 6 Click Score: 20    End of Session Equipment Utilized During Treatment: Gait belt  OT Visit Diagnosis: Other abnormalities of gait and mobility (R26.89);Muscle weakness (generalized) (M62.81)   Activity Tolerance Patient limited by fatigue   Patient Left in chair;with call bell/phone within reach   Nurse Communication Mobility status(notified of LLE )        Time: 1610-9604 OT Time Calculation (min): 32 min  Charges: OT General Charges $OT Visit: 1 Visit OT Treatments $Self Care/Home Management : 23-37 mins  Kiara Section, MS OTR/L  Pager: [phone]    Kiara [name] 08/08/2017, 12:35 PM

## 2017-08-08 NOTE — Progress Notes (Signed)
Physical Therapy Treatment Patient Details Name: Kiara [name] MRN: [id number] DOB: 07/12/1951 Today's Date: 08/08/2017    History of Present Illness Pt is a 67 y.o. female with PMH of CAD s/p CABG (2014), HTN, HL, DM, OSA on Cpap who presented to [facility] with ear pain and mastoiditis. Developed acute HF and positive troponins. Admitted to [facility] on 08/02/16, worked up for HF and NSTEMI. S/p R/L heart cath 1/7 showing severe native CAD with diffusely diseased, subtotally occluded LAD.   PT Comments    Pt progressing well with mobility. Able to ambulate throughout room, perform ADLs, and amb in hallway, progressing to supervision for balance; further distance limited by fatigue and decreased activity tolerance. Pt remains motivated to participate with therapies. Will continue to follow acutely.   Follow Up Recommendations  No PT follow up;Supervision for mobility/OOB     Equipment Recommendations  None recommended by PT    Recommendations for Other Services OT consult     Precautions / Restrictions Precautions Precautions: Fall Restrictions Weight Bearing Restrictions: No    Mobility  Bed Mobility               General bed mobility comments: up in chair  Transfers Overall transfer level: Needs assistance Equipment used: None Transfers: Sit to/from Stand Sit to Stand: Supervision         General transfer comment: Stood from recliner and toilet with supervision for safety  Ambulation/Gait Ambulation/Gait assistance: Min guard;Supervision Ambulation Distance (Feet): 150 Feet Assistive device: None Gait Pattern/deviations: Step-through pattern;Decreased stride length Gait velocity: Decreased Gait velocity interpretation: <1.8 ft/sec, indicative of risk for recurrent falls General Gait Details: Slow, controlled amb initialy min guard for balance, progressing to supervision ambulating in hallway and throughout room. Intermittent use of rail for UE support/balance.  Pt declining further amb secondary to fatigue   Stairs            Wheelchair Mobility    Modified Rankin (Stroke Patients Only)       Balance Overall balance assessment: Needs assistance Sitting-balance support: No upper extremity supported Sitting balance-Leahy Scale: Good     Standing balance support: During functional activity;No upper extremity supported Standing balance-Leahy Scale: Good Standing balance comment: Able to brush teeth standing at sink with no UE support and supervision                            Cognition Arousal/Alertness: Awake/alert Behavior During Therapy: WFL for tasks assessed/performed Overall Cognitive Status: Within Functional Limits for tasks assessed                                        Exercises      General Comments        Pertinent Vitals/Pain Pain Assessment: No/denies pain    Home Living                      Prior Function            PT Goals (current goals can now be found in the care plan section) Acute Rehab PT Goals Patient Stated Goal: Return home PT Goal Formulation: With patient Time For Goal Achievement: 08/19/17 Potential to Achieve Goals: Good Progress towards PT goals: Progressing toward goals    Frequency    Min 3X/week      PT Plan Current plan  remains appropriate    Co-evaluation              AM-PAC PT "6 Clicks" Daily Activity  Outcome Measure  Difficulty turning over in bed (including adjusting bedclothes, sheets and blankets)?: None Difficulty moving from lying on back to sitting on the side of the bed? : None Difficulty sitting down on and standing up from a chair with arms (e.g., wheelchair, bedside commode, etc,.)?: A Little Help needed moving to and from a bed to chair (including a wheelchair)?: A Little Help needed walking in hospital room?: A Little Help needed climbing 3-5 steps with a railing? : A Little 6 Click Score: 20    End of  Session Equipment Utilized During Treatment: Gait belt Activity Tolerance: Patient tolerated treatment well Patient left: in chair;with call bell/phone within reach Nurse Communication: Mobility status PT Visit Diagnosis: Other abnormalities of gait and mobility (R26.89)     Time: 9480-1655 PT Time Calculation (min) (ACUTE ONLY): 32 min  Charges:  $Gait Training: 8-22 mins $Therapeutic Activity: 8-22 mins                    G Codes:      [name], PT, DPT Acute Rehab Services  Pager: [phone]  [name] 08/08/2017, 10:04 AM

## 2017-08-08 NOTE — Plan of Care (Signed)
Pt continues to attempt to grab/reach ETT. RN reiterated to her that the tube is present to assist in breathing. Pt. Does not appear to understand this and needs further reinforcement.

## 2017-08-08 NOTE — Plan of Care (Signed)
Pt has ambulated to the [facility] and bathroom x 3 and ambulated to the bathroom/BSC during the prior shift. Pt. has expressed a desire to walk around the unit with supervision from staff.

## 2017-08-08 NOTE — Progress Notes (Signed)
Progress Note  Patient Name: Kiara [name] Date of Encounter: 08/08/2017  Primary Cardiologist: No primary care provider on file.   Subjective   Continues to feel better each day.  States breathing is improved significantly today.  Sitting up in the chair at the time of my evaluation.  Denies chest pain.  Inpatient Medications    Scheduled Meds: . aspirin EC  81 mg Oral Daily  . atorvastatin  80 mg Oral q1800  . ciprofloxacin-dexamethasone  4 drop Right EAR BID  . clonazePAM  1 mg Oral QHS  . clopidogrel  75 mg Oral Daily  . fluticasone  1 spray Each Nare Daily  . furosemide  80 mg Intravenous BID  . insulin aspart  0-5 Units Subcutaneous QHS  . insulin aspart  0-9 Units Subcutaneous TID WC  . insulin aspart  5 Units Subcutaneous TID WC  . insulin detemir  12 Units Subcutaneous BID  . mouth rinse  15 mL Mouth Rinse BID  . metoprolol succinate  25 mg Oral Daily  . sodium chloride flush  3 mL Intravenous Q12H  . sodium chloride flush  3 mL Intravenous Q12H   Continuous Infusions: . sodium chloride    . sodium chloride     PRN Meds: sodium chloride, sodium chloride, acetaminophen, ammonium lactate, hydrocortisone cream, ondansetron (ZOFRAN) IV, sodium chloride, sodium chloride flush, sodium chloride flush   Vital Signs    Vitals:   08/08/17 0800 08/08/17 0900 08/08/17 1000 08/08/17 1229  BP: (!) 110/51 (!) 84/71 (!) 104/56   Pulse:      Resp: 16 14 (!) 30   Temp:    98.3 F (36.8 C)  TempSrc:    Oral  SpO2: 98% (!) 85% 98%   Weight:      Height:        Intake/Output Summary (Last 24 hours) at 08/08/2017 1242 Last data filed at 08/08/2017 0750 Gross per 24 hour  Intake 322 ml  Output 1725 ml  Net -1403 ml   Filed Weights   08/06/17 0410 08/07/17 0600 08/08/17 0500  Weight: 209 lb 9.6 oz (95.1 kg) 200 lb 13.4 oz (91.1 kg) 198 lb 3.1 oz (89.9 kg)    Telemetry    Normal sinus rhythm- Personally Reviewed   Physical Exam  Alert, oriented woman in no  distress GEN: No acute distress.   Neck:  Moderate JVD Cardiac: RRR, no murmurs, rubs, or gallops.  Respiratory: Clear to auscultation bilaterally. GI: Soft, nontender, non-distended  MS: No edema; No deformity. Neuro:  Nonfocal  Psych: Normal affect   Labs    Chemistry Recent Labs  Lab 08/02/17 1929  08/06/17 1822 08/07/17 0206 08/08/17 0222  NA 134*   < > 135 135 135  K 4.9   < > 4.4 3.9 3.4*  CL 99*   < > 95* 96* 93*  CO2 25   < > 26 28 30   GLUCOSE 102*   < > 292* 226* 197*  BUN 30*   < > 25* 28* 29*  CREATININE 1.56*   < > 1.50* 1.49* 1.65*  CALCIUM 8.5*   < > 9.2 9.0 8.8*  PROT 6.5  --   --   --   --   ALBUMIN 2.5*  --   --   --   --   AST 34  --   --   --   --   ALT 33  --   --   --   --  ALKPHOS 139*  --   --   --   --   BILITOT 0.5  --   --   --   --   GFRNONAA 34*   < > 36* 36* 32*  GFRAA 39*   < > 41* 42* 37*  ANIONGAP 10   < > 14 11 12    < > = values in this interval not displayed.     Hematology Recent Labs  Lab 08/06/17 0153 08/07/17 0206 08/08/17 0222  WBC 11.5* 10.9* 11.9*  RBC 3.34* 3.53* 3.52*  HGB 9.7* 10.2* 10.3*  HCT 31.5* 33.2* 33.0*  MCV 94.3 94.1 93.8  MCH 29.0 28.9 29.3  MCHC 30.8 30.7 31.2  RDW 15.2 15.3 15.3  PLT 402* 410* 368    Cardiac Enzymes Recent Labs  Lab 08/02/17 1929 08/03/17 1118 08/03/17 1829 08/04/17 0047  TROPONINI 3.44* 2.68* 1.78* 2.43*   No results for input(s): TROPIPOC in the last 168 hours.   BNP Recent Labs  Lab 08/02/17 1929  BNP 1,347.3*     DDimer No results for input(s): DDIMER in the last 168 hours.   Radiology    No results found.  Cardiac Studies   Cath: Conclusion   Conclusions: 1. Severe, diffuse native coronary artery disease. 2. Widely patent LIMA-LAD with minimal runoff beyond the distal anastomosis. 3. Patent but poorly visualized SVG-rPDA. Further attempts to engage SVG were not made due to declining respiratory status in the setting of severely elevated left and right  heart pressures. 4. Severely elevated left and right heart filling pressures. 5. Severe pulmonary hypertension. 6. Low normal to reduced cardiac output.  Recommendations: 1. Transfer to cardiac ICU for aggressive diuresis and BiPAP (if needed) for shortness of breath in the setting of acute decompensated systolic heart failure. 2. Optimize evidence-based heart failure therapy. If patient does not respond well to diuresis, consultation with advanced heart failure service will need to be considered. 3. If patient develops chest pain or has continued dyspnea when euvolemic, repeat catheterization to better visualize SVG->rPDA could be considered. 4. Aggressive secondary prevention of CAD.       Patient Profile     67 y.o. female initially presented with ear pain and mastoiditis, resuscitated with IV fluids causing acute pulmonary edema and non-STEMI.  Patient with history of CABG, transferred for treatment of heart failure and possible cardiac catheterization.  Assessment & Plan    1.  Acute on chronic systolic and diastolic heart failure: The patient's weight is down to 198 pounds, 20 pounds down from her peak weight on January 5.  She is 8 L negative fluid balance.  LVEF by outside echo is reported at 30-35%.  Patient currently on furosemide 80 mg IV twice daily and metoprolol succinate.  Will convert furosemide from IV to oral.  Will add Spironolactone 12.5 mg daily.  If her renal function is stable will start an ACE or ARB tomorrow.  Patient appears stable to transfer out of the ICU from a cardiac perspective.  2.  Non-STEMI: Coronary angiography films reviewed.  Medical therapy is indicated.  Patient is treated with aspirin, clopidogrel, and a high intensity statin drug.  Other problems per the primary service.  For questions or updates, please contact [facility] Please consult [email or web address] for contact info under Cardiology/STEMI.      Signed, Tonny Bollman, MD  08/08/2017,  12:42 PM

## 2017-08-08 NOTE — Progress Notes (Signed)
Patient asked for something for heartburn. MD has been paged.

## 2017-08-08 NOTE — Progress Notes (Addendum)
PROGRESS NOTE [facility]   [place]   OZD:[id number] DOB: 27-Sep-1950  DOA: 08/02/2017 PCP: Majel Homer, MD   Brief Narrative:  Kiara [name] is a 67 y.o.femalewith medical history significant ofCAD s/p CABG in 2014 here at [facility] following cardiologist in [place], DM on insulin pump, amputation was hospitalized on 12/28 at [facility] for acute mastoiditis due to pseudomonas, has been on cipro ear drops and cipro iv since and cipro iv changed to zosyn iv yesterday for unclear reason. She feels her ear is improved. However since her admission, she has been becoming more sob and has gained significant weight up to 221 lbs from 173 lbs. She denies any cough. No chest pain. Swelling diffusely and starting to get blisters to her left leg from the swelling. She has no h/o CHF in her past. She was put on lasix 40mg  iv q 12 hours 2 days ago and has not improved. Her cardiologist there was called who advised outpt cath. She and her family were Not happy with this and requested transfer to [facility]. Her trop went up to 1.4 and bnp was 115000. She also had an echo there which showed no wall motion at the apex with EF in the 30s. Pt transferred here after discussion with our cardiology team for cardiac evaluation  Subjective: Patient seen and examined, feels much better, breathing normal. Continues with good diuresis, no acute events overnight.   Assessment & Plan:   Principal Problem:   Acute CHF (congestive heart failure) (HCC) Active Problems:   Type 1 diabetes (HCC)   Essential hypertension   Anxiety   Coronary atherosclerosis due to severely calcified coronary lesion   PVD (peripheral vascular disease) (HCC)   S/P CABG x 2   NSTEMI (non-ST elevated myocardial infarction) (HCC)   CHF (congestive heart failure) (HCC)   Acute respiratory failure with hypoxia (HCC)   SOB (shortness of breath)  Acute on chronic systolic and diastolic CHF (congestive heart failure)  -Continues to  diurese well she is down 20 pounds during hospital stay, and is negative approximately 8 L.  Cardiology.  She is currently on IV Lasix 80 mg twice daily, per cardiology will switch to oral formulation.  Also adding Aldactone.  Creatinine mildly increased  NSTEMI (non-ST elevated myocardial infarction)  -Currently pain-free -Continue medical therapy for cardiology recommendation ASA, Plavix and statin -Patient is now s/p heart cath with findings of severe native CAD with subtotally occluded LAD as well as chronic total occlusions of OM1 and mid RCA -Stable  Acute respiratory failure with hypoxia - improved -secondary to edema -Wean oxygen as tolerated -Continue treatment with diuresis  Type 1 diabetes -CBGs labile -We will increase Levemir to 15 units twice daily, increase NovoLog to 7 units with meals -Continue SSI -Monitor CBGs  Essential hypertension -BP stable -Continue current regimen  Anxiety -held benzos at time of admission due to hypoxia -Tolerating anxiolytic at night  Coronary atherosclerosis due to severely calcified coronary lesion -Patient underwent heart cath 1/7 per above  OM/mastoiditits on right  -Completed antibioti 14 days of antibiotic therapy with Levaquin and Zosyn -Continue Ciprodex for 1 more day -Stable  DVT prophylaxis: Heparin Code Status: Full code Family Communication: None at bedside Disposition Plan: Home in the next 24-48 hours  Consultants:   Cardiology  Procedures:   Cardiac cath 08/06/2017  Antimicrobials: Anti-infectives (From admission, onward)   Start     Dose/Rate Route Frequency Ordered Stop   08/06/17 0200  piperacillin-tazobactam (ZOSYN) IVPB 3.375 g  Status:  Discontinued     3.375 g 12.5 mL/hr over 240 Minutes Intravenous Every 8 hours 08/06/17 2223 08/08/17 0903   08/02/17 2230  piperacillin-tazobactam (ZOSYN) IVPB 3.375 g  Status:  Discontinued     3.375 g 12.5 mL/hr over 240 Minutes Intravenous  Every 8 hours 08/02/17 2205 08/06/17 2223       Objective: Vitals:   08/08/17 0500 08/08/17 0600 08/08/17 0749 08/08/17 0800  BP: 111/63 (!) 123/54  (!) 110/51  Pulse:      Resp: 16 (!) 21  16  Temp:   98.2 F (36.8 C)   TempSrc:   Oral   SpO2: 94% 100%  98%  Weight: 89.9 kg (198 lb 3.1 oz)     Height:        Intake/Output Summary (Last 24 hours) at 08/08/2017 0905 Last data filed at 08/08/2017 0750 Gross per 24 hour  Intake 492 ml  Output 2225 ml  Net -1733 ml   Filed Weights   08/06/17 0410 08/07/17 0600 08/08/17 0500  Weight: 95.1 kg (209 lb 9.6 oz) 91.1 kg (200 lb 13.4 oz) 89.9 kg (198 lb 3.1 oz)    Examination:  General exam: Appears calm and comfortable  Respiratory system: Air entry diminished bilaterally, mild bibasilar crackles. Cardiovascular system: S1 & S2 heard, RRR. No JVD, murmurs, rubs or gallops Gastrointestinal system: Abdomen is nondistended, soft and nontender.  Central nervous system: Alert and oriented. No focal neurological deficits. Extremities: Bilateral lower extremity edema 1+, dry skin  Skin: No rashes Psychiatry: Judgement and insight appear normal. Mood & affect appropriate.   Data Reviewed: I have personally reviewed following labs and imaging studies  CBC: Recent Labs  Lab 08/02/17 1929 08/04/17 0307 08/05/17 0455 08/06/17 0153 08/07/17 0206 08/08/17 0222  WBC 12.8* 9.9 9.2 11.5* 10.9* 11.9*  NEUTROABS 8.9*  --   --   --   --   --   HGB 9.9* 8.5* 8.7* 9.7* 10.2* 10.3*  HCT 31.8* 27.6* 28.2* 31.5* 33.2* 33.0*  MCV 95.2 95.8 94.6 94.3 94.1 93.8  PLT 449* 372 370 402* 410* 368   Basic Metabolic Panel: Recent Labs  Lab 08/05/17 0455 08/06/17 0153 08/06/17 1822 08/07/17 0206 08/08/17 0222  NA 134* 136 135 135 135  K 4.0 4.0 4.4 3.9 3.4*  CL 96* 98* 95* 96* 93*  CO2 26 27 26 28 30   GLUCOSE 113* 164* 292* 226* 197*  BUN 37* 31* 25* 28* 29*  CREATININE 1.73* 1.56* 1.50* 1.49* 1.65*  CALCIUM 8.6* 8.7* 9.2 9.0 8.8*  MG  --    --   --   --  2.0   GFR: Estimated Creatinine Clearance: 36.7 mL/min (A) (by C-G formula based on SCr of 1.65 mg/dL (H)). Liver Function Tests: Recent Labs  Lab 08/02/17 1929  AST 34  ALT 33  ALKPHOS 139*  BILITOT 0.5  PROT 6.5  ALBUMIN 2.5*   No results for input(s): LIPASE, AMYLASE in the last 168 hours. No results for input(s): AMMONIA in the last 168 hours. Coagulation Profile: Recent Labs  Lab 08/03/17 1118  INR 1.21   Cardiac Enzymes: Recent Labs  Lab 08/02/17 1929 08/03/17 1118 08/03/17 1829 08/04/17 0047  TROPONINI 3.44* 2.68* 1.78* 2.43*   BNP (last 3 results) No results for input(s): PROBNP in the last 8760 hours. HbA1C: No results for input(s): HGBA1C in the last 72 hours. CBG: Recent Labs  Lab 08/07/17 0744 08/07/17 1114 08/07/17 1622 08/07/17 2124 08/08/17 0741  GLUCAP 233*  361* 263* 171* 222*   Lipid Profile: No results for input(s): CHOL, HDL, LDLCALC, TRIG, CHOLHDL, LDLDIRECT in the last 72 hours. Thyroid Function Tests: No results for input(s): TSH, T4TOTAL, FREET4, T3FREE, THYROIDAB in the last 72 hours. Anemia Panel: No results for input(s): VITAMINB12, FOLATE, FERRITIN, TIBC, IRON, RETICCTPCT in the last 72 hours. Sepsis Labs: No results for input(s): PROCALCITON, LATICACIDVEN in the last 168 hours.  Recent Results (from the past 240 hour(s))  MRSA PCR Screening     Status: None   Collection Time: 08/06/17  1:31 PM  Result Value Ref Range Status   MRSA by PCR NEGATIVE NEGATIVE Final    Comment:        The GeneXpert MRSA Assay (FDA approved for NASAL specimens only), is one component of a comprehensive MRSA colonization surveillance program. It is not intended to diagnose MRSA infection nor to guide or monitor treatment for MRSA infections.       Radiology Studies: No results found.    Scheduled Meds: . aspirin EC  81 mg Oral Daily  . atorvastatin  80 mg Oral q1800  . ciprofloxacin-dexamethasone  4 drop Right EAR  BID  . clonazePAM  1 mg Oral QHS  . clopidogrel  75 mg Oral Daily  . fluticasone  1 spray Each Nare Daily  . furosemide  80 mg Intravenous BID  . insulin aspart  0-5 Units Subcutaneous QHS  . insulin aspart  0-9 Units Subcutaneous TID WC  . insulin aspart  5 Units Subcutaneous TID WC  . insulin detemir  12 Units Subcutaneous BID  . mouth rinse  15 mL Mouth Rinse BID  . metoprolol succinate  25 mg Oral Daily  . sodium chloride flush  3 mL Intravenous Q12H  . sodium chloride flush  3 mL Intravenous Q12H   Continuous Infusions: . sodium chloride    . sodium chloride       LOS: 6 days    Time spent: Total of 25 minutes spent with pt, greater than 50% of which was spent in discussion of  treatment, counseling and coordination of care   Latrelle Dodrill, MD Pager: Text Page via [email or web address]   If 7PM-7AM, please contact night-coverage [email or web address] 08/08/2017, 9:05 AM

## 2017-08-08 NOTE — Progress Notes (Signed)
Inpatient Diabetes Program Recommendations  AACE/ADA: New Consensus Statement on Inpatient Glycemic Control (2015)  Target Ranges:  Prepandial:   less than 140 mg/dL      Peak postprandial:   less than 180 mg/dL (1-2 hours)      Critically ill patients:  140 - 180 mg/dL   Lab Results  Component Value Date   GLUCAP 222 (H) 08/08/2017   HGBA1C 8.7 (H) 04/09/2013    Review of Glycemic ControlResults for Kiara [name], [place] (MRN [id number]) as of 08/08/2017 10:05  Ref. Range 08/07/2017 07:44 08/07/2017 11:14 08/07/2017 16:22 08/07/2017 21:24 08/08/2017 07:41  Glucose-Capillary Latest Ref Range: 65 - 99 mg/dL 381 (H) 017 (H) 510 (H) 171 (H) 222 (H)   Diabetes history: Type 1 DM Outpatient Diabetes medications: Insulin pump: Medtronic 12-7a- 1.25 units/hr 7a-12 [place]-1. 75 units/hr 1 unit/15 grams CHO 1 unit for every 50 mg/dL greater than 258 mg/dL Current orders for Inpatient glycemic control: Novolog sensitive tid with meals and HS, Novolog 5 units tid with meals, Levemir 12 units bid Inpatient Diabetes Program Recommendations:    Please consider increasing Levemir to 15 units bid and increase Novolog meal coverage to 7 units tid with meals.  Text page- Will follow.   Thanks,  Beryl Meager, RN, BC-ADM Inpatient Diabetes Coordinator Pager [phone] (8a-5p)

## 2017-08-09 LAB — CBC
HCT: 33.5 % — ABNORMAL LOW (ref 36.0–46.0)
Hemoglobin: 10.4 g/dL — ABNORMAL LOW (ref 12.0–15.0)
MCH: 29.4 pg (ref 26.0–34.0)
MCHC: 31 g/dL (ref 30.0–36.0)
MCV: 94.6 fL (ref 78.0–100.0)
PLATELETS: 368 10*3/uL (ref 150–400)
RBC: 3.54 MIL/uL — AB (ref 3.87–5.11)
RDW: 15.2 % (ref 11.5–15.5)
WBC: 13.3 10*3/uL — AB (ref 4.0–10.5)

## 2017-08-09 LAB — GLUCOSE, CAPILLARY
GLUCOSE-CAPILLARY: 195 mg/dL — AB (ref 65–99)
GLUCOSE-CAPILLARY: 293 mg/dL — AB (ref 65–99)

## 2017-08-09 LAB — BASIC METABOLIC PANEL
ANION GAP: 10 (ref 5–15)
BUN: 22 mg/dL — ABNORMAL HIGH (ref 6–20)
CO2: 28 mmol/L (ref 22–32)
Calcium: 8.9 mg/dL (ref 8.9–10.3)
Chloride: 100 mmol/L — ABNORMAL LOW (ref 101–111)
Creatinine, Ser: 1.34 mg/dL — ABNORMAL HIGH (ref 0.44–1.00)
GFR, EST AFRICAN AMERICAN: 47 mL/min — AB (ref >60)
GFR, EST NON AFRICAN AMERICAN: 41 mL/min — AB (ref >60)
Glucose, Bld: 192 mg/dL — ABNORMAL HIGH (ref 65–99)
POTASSIUM: 3.9 mmol/L (ref 3.5–5.1)
SODIUM: 138 mmol/L (ref 135–145)

## 2017-08-09 MED ORDER — CLOPIDOGREL BISULFATE 75 MG PO TABS: 75.0000 mg | ORAL_TABLET | Freq: Every day | ORAL | 0 refills | Status: DC

## 2017-08-09 MED ORDER — SPIRONOLACTONE 25 MG PO TABS: 12.5000 mg | ORAL_TABLET | Freq: Every day | ORAL | 0 refills | Status: DC

## 2017-08-09 MED ORDER — FUROSEMIDE 40 MG PO TABS: 40.0000 mg | ORAL_TABLET | Freq: Two times a day (BID) | ORAL | 0 refills | Status: DC

## 2017-08-09 MED ORDER — METOPROLOL SUCCINATE ER 25 MG PO TB24: 25.0000 mg | ORAL_TABLET | Freq: Every day | ORAL | 0 refills | Status: DC

## 2017-08-09 NOTE — Progress Notes (Signed)
Physical Therapy Treatment Patient Details Name: Kiara [name] MRN: [id number] DOB: Nov 11, 1950 Today's Date: 08/09/2017    History of Present Illness Pt is a 67 y.o. female with PMH of CAD s/p CABG (2014), HTN, HL, DM, OSA on Cpap who presented to [facility] with ear pain and mastoiditis. Developed acute HF and positive troponins. Admitted to [facility] on 08/02/16, worked up for HF and NSTEMI. S/p R/L heart cath 1/7 showing severe native CAD with diffusely diseased, subtotally occluded LAD.    PT Comments    Pt is up to walk with minimal guard, has been able to walk with two standing rests and not a significant symptom to be concerned for her.  Pt had O2 sats with 97% at end of walk and pulses were stable at the end.  Will continue to see her as she is tolerating activity and making progress but agreed with her NP to add outpatient therapy to her follow up plan since she is SOB with extended effort.      Follow Up Recommendations  Supervision for mobility/OOB;Outpatient PT     Equipment Recommendations  None recommended by PT    Recommendations for Other Services OT consult     Precautions / Restrictions Precautions Precautions: Fall Precaution Comments: fatigues quickly Restrictions Weight Bearing Restrictions: No    Mobility  Bed Mobility               General bed mobility comments: up in chair and comfortable  Transfers Overall transfer level: Modified independent Equipment used: None Transfers: Sit to/from Stand              Ambulation/Gait Ambulation/Gait assistance: Supervision;Min guard Ambulation Distance (Feet): 200 Feet Assistive device: None Gait Pattern/deviations: Step-through pattern;Decreased stride length;Drifts right/left;Wide base of support Gait velocity: Decreased Gait velocity interpretation: Below normal speed for age/gender General Gait Details: occasionally reaches for hall railing to feel steady   Stairs            Wheelchair  Mobility    Modified Rankin (Stroke Patients Only)       Balance Overall balance assessment: Needs assistance Sitting-balance support: Feet supported Sitting balance-Leahy Scale: Good     Standing balance support: During functional activity Standing balance-Leahy Scale: Fair Standing balance comment: min guard for security                            Cognition Arousal/Alertness: Awake/alert Behavior During Therapy: WFL for tasks assessed/performed Overall Cognitive Status: Within Functional Limits for tasks assessed                                        Exercises General Exercises - Lower Extremity Ankle Circles/Pumps: AROM;Both;5 reps Long Arc Quad: Strengthening;Both;10 reps Heel Slides: Strengthening;Both;10 reps Hip ABduction/ADduction: Strengthening;Both;10 reps    General Comments General comments (skin integrity, edema, etc.): has been having some skin irritation from bed on tailbone area      Pertinent Vitals/Pain Pain Assessment: No/denies pain    Home Living                      Prior Function            PT Goals (current goals can now be found in the care plan section) Acute Rehab PT Goals Patient Stated Goal: Return home Progress towards PT goals: Progressing toward goals  Frequency    Min 3X/week      PT Plan Discharge plan needs to be updated    Co-evaluation              AM-PAC PT "6 Clicks" Daily Activity  Outcome Measure  Difficulty turning over in bed (including adjusting bedclothes, sheets and blankets)?: None Difficulty moving from lying on back to sitting on the side of the bed? : None Difficulty sitting down on and standing up from a chair with arms (e.g., wheelchair, bedside commode, etc,.)?: None Help needed moving to and from a bed to chair (including a wheelchair)?: A Little Help needed walking in hospital room?: A Little Help needed climbing 3-5 steps with a railing? : A  Little 6 Click Score: 21    End of Session Equipment Utilized During Treatment: Gait belt Activity Tolerance: Patient tolerated treatment well Patient left: in chair;with call bell/phone within reach;with chair alarm set Nurse Communication: Mobility status PT Visit Diagnosis: Other abnormalities of gait and mobility (R26.89)     Time: 8916-9450 PT Time Calculation (min) (ACUTE ONLY): 26 min  Charges:  $Gait Training: 8-22 mins $Therapeutic Exercise: 8-22 mins                    G Codes:  Functional Assessment Tool Used: AM-PAC 6 Clicks Basic Mobility     Ivar Drape 08/09/2017, 1:37 PM   Samul Dada, PT MS Acute Rehab Dept. Number: [facility] [id number] and [facility] [phone]

## 2017-08-09 NOTE — Progress Notes (Signed)
Physician Discharge Summary  Kiara [name]  WUJ:[id number]  DOB: 08/23/1950  DOA: 08/02/2017 PCP: Majel Homer, MD  Admit date: 08/02/2017 Discharge date: 08/09/2017  Admitted From: Home  Disposition:  Home   Recommendations for Outpatient Follow-up:  1. Follow up with PCP in 1-2 weeks 2. Please obtain BMP/CBC in one week to monitor Hgb and Cr  3. Follow up with cardiology in 2 weeks   Discharge Condition: Stable  CODE STATUS: Full code  Diet recommendation: Heart Healthy / Carb Modified  Brief/Interim Summary: For full details see H&P/progress note but in brief, Kiara [name] is a 67 y.o.femalewith medical history significant ofCAD s/p CABG in 2014 here at [facility] following cardiologist in [place], DM on insulin pump, amputation was hospitalized on 12/28 at [facility] for acute mastoiditis [name] to pseudomonas, has been on cipro ear drops and cipro iv since and cipro iv changed to zosyn iv. She feels her ear is improved. However after admission she became more sob and gained significant weight up to 221 lbs from 173 lbs. Swelling diffusely and starting to get blisters to her left leg from the swelling. She has no h/o CHF in her past. She was put on lasix 40mg  iv and did not improved. Her cardiologist there was called who advised outpt cath. Her trop went up to 1.4 and bnp was markedly elevated  She also had an echo there which showed no wall motion at the apex with EF in the 30s. Patient was transferred to [facility] for cardiac evaluation. Patient underwent cardiac cath 08/06/17 with findings of severe native CAD no stent were placed and recommendations of agressive medical therapy was made and optimize treatment for heart failure. Patient was aggressively diuresed with IV lasix, she down ~ 22 lb, and negative ~ 10 L during hospital stay. She was weaned off O2 and has clinically improved. Patient was treated with Zosyn for mastoiditis, which completed 14 days of therapy. Patient was cleared by  cardiology for discharge.   Subjective: Patient seen and examined, she is off O2, doing well, swelling significantly decrease. Continues with good diuresis. No acute events.   Discharge Diagnoses/Hospital Course:  Principal Problem:   Acute CHF (congestive heart failure) (HCC) Active Problems:   Type 1 diabetes (HCC)   Essential hypertension   Anxiety   Coronary atherosclerosis [name] to severely calcified coronary lesion   PVD (peripheral vascular disease) (HCC)   S/P CABG x 2   NSTEMI (non-ST elevated myocardial infarction) (HCC)   CHF (congestive heart failure) (HCC)   Acute respiratory failure with hypoxia (HCC)   SOB (shortness of breath)  Acute on chronic systolic and diastolic CHF (congestive heart failure)  -She was aggressively diuresed, down 22 lb and ~ 10 L during hospital stay. Patient will remain on Lasix and aldactone on discharge, she is to follow with cardiology. Would hold ACE/ARB's for now as Cr was elevated but improving. Recommend to check Cr in 1 week and if stable consider ACE/ARB.   NSTEMI (non-ST elevated myocardial infarction)  -Currently pain-free -Continue medical therapy for cardiology recommendation ASA, Plavix and statin -Patient is now s/p heart cath with findings of severe native CAD with subtotally occluded LAD as well as chronic total occlusions of OM1 and mid RCA -Stable  Acute respiratory failure with hypoxia - resolved  -secondary to edema  Type 1 diabetes -CBGs labile -Patient is on insulin pump, resume home dose of insulin.  -She was treated with Levamir and SSI while inpatient, CBG's where labile  -Follow  up with PCP as outpatient   Essential hypertension -BP stable -On metoprolol   Anxiety -Continue klonopin at bedtime  -Follow up with PCP  Coronary atherosclerosis [name] to severely calcified coronary lesion -Patient underwent heart cath 1/7 per above  OM/mastoiditits on right - Stable  -Completed antibioti 14  days of antibiotic therapy with Levaquin and Zosyn -Completer course of Ciprodex for 14 days   All other chronic medical condition were stable during the hospitalization.  Patient was seen by physical therapy, recommending outpatient PT  On the day of the discharge the patient's vitals were stable, and no other acute medical condition were reported by patient. the patient was felt safe to be discharge to home   Discharge Instructions  You were cared for by a hospitalist during your hospital stay. If you have any questions about your discharge medications or the care you received while you were in the hospital after you are discharged, you can call the unit and asked to speak with the hospitalist on call if the hospitalist that took care of you is not available. Once you are discharged, your primary care physician will handle any further medical issues. Please note that NO REFILLS for any discharge medications will be authorized once you are discharged, as it is imperative that you return to your primary care physician (or establish a relationship with a primary care physician if you do not have one) for your aftercare needs so that they can reassess your need for medications and monitor your lab values.  Discharge Instructions    Call MD for:  difficulty breathing, headache or visual disturbances   Complete by:  As directed    Call MD for:  extreme fatigue   Complete by:  As directed    Call MD for:  hives   Complete by:  As directed    Call MD for:  persistant dizziness or light-headedness   Complete by:  As directed    Call MD for:  persistant nausea and vomiting   Complete by:  As directed    Call MD for:  redness, tenderness, or signs of infection (pain, swelling, redness, odor or green/yellow discharge around incision site)   Complete by:  As directed    Call MD for:  severe uncontrolled pain   Complete by:  As directed    Call MD for:  temperature >100.4   Complete by:  As directed     Diet - low sodium heart healthy   Complete by:  As directed    Increase activity slowly   Complete by:  As directed      Allergies as of 08/09/2017      Reactions   Bactrim [sulfamethoxazole-trimethoprim] Rash   Codeine Nausea And Vomiting   Mobic [meloxicam]    swelling   Morphine And Related Nausea Only, Other (See Comments)   confusion   Silvadene [silver Sulfadiazine]    Red hot rash and blisters   Adhesive [tape] Rash   Band aid brand  laxex   Sulfamethoxazole-trimethoprim Rash      Medication List    STOP taking these medications   benazepril 10 MG tablet Commonly known as:  LOTENSIN   metoprolol tartrate 25 MG tablet Commonly known as:  LOPRESSOR     TAKE these medications   aspirin EC 81 MG tablet Take 81 mg by mouth daily.   atorvastatin 80 MG tablet Commonly known as:  LIPITOR Take 80 mg by mouth daily.   clonazePAM 1 MG tablet  Commonly known as:  KLONOPIN Take 0.5 tablets (0.5 mg total) by mouth at bedtime. What changed:  how much to take   clopidogrel 75 MG tablet Commonly known as:  PLAVIX Take 1 tablet (75 mg total) by mouth daily. Start taking on:  08/10/2017   furosemide 40 MG tablet Commonly known as:  LASIX Take 1 tablet (40 mg total) by mouth 2 (two) times daily.   gabapentin 800 MG tablet Commonly known as:  NEURONTIN Take 800 mg by mouth 3 (three) times daily.   insulin aspart 100 UNIT/ML injection Commonly known as:  novoLOG 1.25 Units 12 Am-7 Am,  1.75 u/hr 7 AM  Til 12 AM, Bolus 1 unit for every 15 carbs, 1 Unit for every 50 points greater than 150 Insulin pump   levocetirizine 5 MG tablet Commonly known as:  XYZAL Take 5 mg by mouth daily. TAKE 1 TABLET EVERY DAY (NEEDS APPOINTMENT AND LABS)   metoprolol succinate 25 MG 24 hr tablet Commonly known as:  TOPROL-XL Take 1 tablet (25 mg total) by mouth daily. Start taking on:  08/10/2017   PARoxetine 20 MG tablet Commonly known as:  PAXIL Take 20 mg by mouth at bedtime.    polyethylene glycol packet Commonly known as:  MIRALAX / GLYCOLAX Take 17 g by mouth daily as needed.   PROAIR HFA 108 (90 Base) MCG/ACT inhaler Generic drug:  albuterol Inhale 2 puffs into the lungs every 4 (four) hours as needed.   PROBIOTIC-10 PO Take 1 tablet by mouth daily.   ranitidine 300 MG tablet Commonly known as:  ZANTAC Take 300 mg by mouth daily. TAKE 1 TABLET EVERY DAY   spironolactone 25 MG tablet Commonly known as:  ALDACTONE Take 0.5 tablets (12.5 mg total) by mouth daily. Start taking on:  08/10/2017   Vitamin D 2000 units tablet Take 2,000 Units by mouth daily.      Follow-up Information    Laurann Montana, PA-C Follow up on 08/22/2017.   Specialties:  Cardiology, Radiology Why:  Your appointment will be on 08/22/17 at 130pm with Ronie Spies, Physician Assistant with Dr. Purvis Sheffield. Please arrive to your appointment at 115pm.  Contact information: [address] [place] [address] [phone]        Majel Homer, MD. Schedule an appointment as soon as possible for a visit in 1 week(s).   Specialty:  Internal Medicine Why:  Hospital follow up  Contact information: [address] [place] [address] [phone]          Allergies  Allergen Reactions  . Bactrim [Sulfamethoxazole-Trimethoprim] Rash  . Codeine Nausea And Vomiting  . Mobic [Meloxicam]     swelling  . Morphine And Related Nausea Only and Other (See Comments)    confusion  . Silvadene [Silver Sulfadiazine]     Red hot rash and blisters  . Adhesive [Tape] Rash    Band aid brand  laxex  . Sulfamethoxazole-Trimethoprim Rash    Consultations:  Cardiology    Procedures/Studies: Dg Chest 2 View  Result Date: 08/02/2017 CLINICAL DATA:  Shortness of breath. EXAM: CHEST  2 VIEW COMPARISON:  Radiographs and CT yesterday. FINDINGS: Post median sternotomy. Again seen cardiomegaly. Bilateral pleural effusions and bibasilar airspace disease, similar to prior exam.  Worsening vascular congestion and perihilar aeration. No pneumothorax. IMPRESSION: Worsening CHF with increasing vascular congestion and worsening perihilar aeration. Bilateral pleural effusions and bibasilar airspace disease appears similar. Electronically Signed   By: Rubye Oaks M.D.   On: 08/02/2017  22:47   Dg Chest Port 1 View  Result Date: 08/04/2017 CLINICAL DATA:  Dyspnea EXAM: PORTABLE CHEST 1 VIEW COMPARISON:  08/02/2017 FINDINGS: Cardiomegaly with pulmonary vascular congestion and suspected mild interstitial edema, although improved. Small to moderate bilateral pleural effusions, left greater than right. Postsurgical changes related to prior CABG.  Median sternotomy. IMPRESSION: Cardiomegaly with mild interstitial edema, improved. Small to moderate bilateral pleural effusions, left greater than right. Electronically Signed   By: Charline Bills M.D.   On: 08/04/2017 10:53    Discharge Exam: Vitals:   08/08/17 2200 08/09/17 0551  BP: (!) 119/52 (!) 142/45  Pulse: 69 91  Resp:  19  Temp:  98 F (36.7 C)  SpO2:  98%   Vitals:   08/08/17 1937 08/08/17 2200 08/09/17 0500 08/09/17 0551  BP: 122/60 (!) 119/52  (!) 142/45  Pulse: (!) 106 69  91  Resp: 18   19  Temp: 97.8 F (36.6 C)   98 F (36.7 C)  TempSrc: Oral   Oral  SpO2: 98%   98%  Weight:   89 kg (196 lb 4.8 oz)   Height:        General: Pt is alert, awake, not in acute distress Cardiovascular: RRR, S1/S2  Respiratory: CTA bilaterally Abdominal: Soft, NT, ND, Extremities: B/L LE edema, trace    The results of significant diagnostics from this hospitalization (including imaging, microbiology, ancillary and laboratory) are listed below for reference.     Microbiology: Recent Results (from the past 240 hour(s))  MRSA PCR Screening     Status: None   Collection Time: 08/06/17  1:31 PM  Result Value Ref Range Status   MRSA by PCR NEGATIVE NEGATIVE Final    Comment:        The GeneXpert MRSA Assay  (FDA approved for NASAL specimens only), is one component of a comprehensive MRSA colonization surveillance program. It is not intended to diagnose MRSA infection nor to guide or monitor treatment for MRSA infections.      Labs: BNP (last 3 results) Recent Labs    08/02/17 1929  BNP 1,347.3*   Basic Metabolic Panel: Recent Labs  Lab 08/06/17 0153 08/06/17 1822 08/07/17 0206 08/08/17 0222 08/08/17 1620 08/09/17 0626  NA 136 135 135 135  --  138  K 4.0 4.4 3.9 3.4*  --  3.9  CL 98* 95* 96* 93*  --  100*  CO2 27 26 28 30   --  28  GLUCOSE 164* 292* 226* 197* 436* 192*  BUN 31* 25* 28* 29*  --  22*  CREATININE 1.56* 1.50* 1.49* 1.65*  --  1.34*  CALCIUM 8.7* 9.2 9.0 8.8*  --  8.9  MG  --   --   --  2.0  --   --    Liver Function Tests: Recent Labs  Lab 08/02/17 1929  AST 34  ALT 33  ALKPHOS 139*  BILITOT 0.5  PROT 6.5  ALBUMIN 2.5*   No results for input(s): LIPASE, AMYLASE in the last 168 hours. No results for input(s): AMMONIA in the last 168 hours. CBC: Recent Labs  Lab 08/02/17 1929  08/05/17 0455 08/06/17 0153 08/07/17 0206 08/08/17 0222 08/09/17 0626  WBC 12.8*   < > 9.2 11.5* 10.9* 11.9* 13.3*  NEUTROABS 8.9*  --   --   --   --   --   --   HGB 9.9*   < > 8.7* 9.7* 10.2* 10.3* 10.4*  HCT 31.8*   < >  28.2* 31.5* 33.2* 33.0* 33.5*  MCV 95.2   < > 94.6 94.3 94.1 93.8 94.6  PLT 449*   < > 370 402* 410* 368 368   < > = values in this interval not displayed.   Cardiac Enzymes: Recent Labs  Lab 08/02/17 1929 08/03/17 1118 08/03/17 1829 08/04/17 0047  TROPONINI 3.44* 2.68* 1.78* 2.43*   BNP: Invalid input(s): POCBNP CBG: Recent Labs  Lab 08/08/17 1228 08/08/17 1613 08/08/17 2058 08/09/17 0738 08/09/17 1225  GLUCAP 384* 429* 334* 195* 293*   D-Dimer No results for input(s): DDIMER in the last 72 hours. Hgb A1c No results for input(s): HGBA1C in the last 72 hours. Lipid Profile No results for input(s): CHOL, HDL, LDLCALC, TRIG,  CHOLHDL, LDLDIRECT in the last 72 hours. Thyroid function studies No results for input(s): TSH, T4TOTAL, T3FREE, THYROIDAB in the last 72 hours.  Invalid input(s): FREET3 Anemia work up No results for input(s): VITAMINB12, FOLATE, FERRITIN, TIBC, IRON, RETICCTPCT in the last 72 hours. Urinalysis    Component Value Date/Time   COLORURINE YELLOW 04/09/2013 1516   APPEARANCEUR CLEAR 04/09/2013 1516   LABSPEC 1.006 04/09/2013 1516   PHURINE 6.0 04/09/2013 1516   GLUCOSEU NEGATIVE 04/09/2013 1516   HGBUR NEGATIVE 04/09/2013 1516   BILIRUBINUR NEGATIVE 04/09/2013 1516   KETONESUR NEGATIVE 04/09/2013 1516   PROTEINUR NEGATIVE 04/09/2013 1516   UROBILINOGEN 0.2 04/09/2013 1516   NITRITE NEGATIVE 04/09/2013 1516   LEUKOCYTESUR TRACE (A) 04/09/2013 1516   Sepsis Labs Invalid input(s): PROCALCITONIN,  WBC,  LACTICIDVEN Microbiology Recent Results (from the past 240 hour(s))  MRSA PCR Screening     Status: None   Collection Time: 08/06/17  1:31 PM  Result Value Ref Range Status   MRSA by PCR NEGATIVE NEGATIVE Final    Comment:        The GeneXpert MRSA Assay (FDA approved for NASAL specimens only), is one component of a comprehensive MRSA colonization surveillance program. It is not intended to diagnose MRSA infection nor to guide or monitor treatment for MRSA infections.      Time coordinating discharge:  32 minutes  SIGNED:  Latrelle Dodrill, MD  [facility] 08/09/2017, 2:06 PM  Pager please text page via  [email or web address]

## 2017-08-09 NOTE — Progress Notes (Signed)
Pt. discharge home, IV and Tele removed. Discharge instructions given. Daughter in law at the bedside. Cath site Dressing removed instructed pt on signs and symptoms on infection as well as no baths or hot tubs.

## 2017-08-09 NOTE — Plan of Care (Signed)
  Education: Knowledge of General Education information will improve 08/09/2017 1158 - Progressing by Loel Lofty, RN

## 2017-08-09 NOTE — Progress Notes (Signed)
Progress Note  Patient Name: Kiara [name] Date of Encounter: 08/09/2017  Primary Cardiologist: Dr. Okey Dupre  Subjective   Pt doing much better today. Breathing is good. Sitting up to chair and ambulating in the room. She is anxious to know if she will be going home today.   Inpatient Medications    Scheduled Meds: . aspirin EC  81 mg Oral Daily  . atorvastatin  80 mg Oral q1800  . ciprofloxacin-dexamethasone  4 drop Right EAR BID  . clonazePAM  1 mg Oral QHS  . clopidogrel  75 mg Oral Daily  . fluticasone  1 spray Each Nare Daily  . furosemide  40 mg Oral BID  . insulin aspart  0-5 Units Subcutaneous QHS  . insulin aspart  0-9 Units Subcutaneous TID WC  . insulin aspart  7 Units Subcutaneous TID WC  . insulin detemir  15 Units Subcutaneous BID  . mouth rinse  15 mL Mouth Rinse BID  . metoprolol succinate  25 mg Oral Daily  . sodium chloride flush  3 mL Intravenous Q12H  . sodium chloride flush  3 mL Intravenous Q12H  . spironolactone  12.5 mg Oral Daily   Continuous Infusions: . sodium chloride    . sodium chloride     PRN Meds: sodium chloride, sodium chloride, acetaminophen, ammonium lactate, hydrocortisone cream, ondansetron (ZOFRAN) IV, sodium chloride, sodium chloride flush, sodium chloride flush   Vital Signs    Vitals:   08/08/17 1937 08/08/17 2200 08/09/17 0500 08/09/17 0551  BP: 122/60 (!) 119/52  (!) 142/45  Pulse: (!) 106 69  91  Resp: 18   19  Temp: 97.8 F (36.6 C)   98 F (36.7 C)  TempSrc: Oral   Oral  SpO2: 98%   98%  Weight:   196 lb 4.8 oz (89 kg)   Height:        Intake/Output Summary (Last 24 hours) at 08/09/2017 0927 Last data filed at 08/09/2017 0842 Gross per 24 hour  Intake 415 ml  Output 1600 ml  Net -1185 ml   Filed Weights   08/08/17 0500 08/08/17 1440 08/09/17 0500  Weight: 198 lb 3.1 oz (89.9 kg) 197 lb 15.6 oz (89.8 kg) 196 lb 4.8 oz (89 kg)    Physical Exam   General: Well developed, well nourished, NAD Skin: Warm,  dry, intact  Head: Normocephalic, atraumatic, clear, moist mucus membranes. Neck: Negative for carotid bruits. No JVD Lungs:Clear to ausculation bilaterally. No wheezes, rales, or rhonchi. Breathing is unlabored. Cardiovascular: RRR with S1 S2. No murmurs, rubs, or gallops Abdomen: Soft, non-tender, non-distended with normoactive bowel sounds. No obvious abdominal masses. MSK: Strength and tone appear normal for age. 5/5 in all extremities Extremities: No edema. No clubbing or cyanosis. DP/PT pulses 2+ bilaterally Neuro: Alert and oriented. No focal deficits. No facial asymmetry. MAE spontaneously. Psych: Responds to questions appropriately with normal affect.    Labs    Chemistry Recent Labs  Lab 08/02/17 1929  08/07/17 0206 08/08/17 0222 08/08/17 1620 08/09/17 0626  NA 134*   < > 135 135  --  138  K 4.9   < > 3.9 3.4*  --  3.9  CL 99*   < > 96* 93*  --  100*  CO2 25   < > 28 30  --  28  GLUCOSE 102*   < > 226* 197* 436* 192*  BUN 30*   < > 28* 29*  --  22*  CREATININE 1.56*   < >  1.49* 1.65*  --  1.34*  CALCIUM 8.5*   < > 9.0 8.8*  --  8.9  PROT 6.5  --   --   --   --   --   ALBUMIN 2.5*  --   --   --   --   --   AST 34  --   --   --   --   --   ALT 33  --   --   --   --   --   ALKPHOS 139*  --   --   --   --   --   BILITOT 0.5  --   --   --   --   --   GFRNONAA 34*   < > 36* 32*  --  41*  GFRAA 39*   < > 42* 37*  --  47*  ANIONGAP 10   < > 11 12  --  10   < > = values in this interval not displayed.     Hematology Recent Labs  Lab 08/07/17 0206 08/08/17 0222 08/09/17 0626  WBC 10.9* 11.9* 13.3*  RBC 3.53* 3.52* 3.54*  HGB 10.2* 10.3* 10.4*  HCT 33.2* 33.0* 33.5*  MCV 94.1 93.8 94.6  MCH 28.9 29.3 29.4  MCHC 30.7 31.2 31.0  RDW 15.3 15.3 15.2  PLT 410* 368 368    Cardiac Enzymes Recent Labs  Lab 08/02/17 1929 08/03/17 1118 08/03/17 1829 08/04/17 0047  TROPONINI 3.44* 2.68* 1.78* 2.43*   No results for input(s): TROPIPOC in the last 168 hours.    BNP Recent Labs  Lab 08/02/17 1929  BNP 1,347.3*     DDimer No results for input(s): DDIMER in the last 168 hours.   Radiology    No results found.  Telemetry    08/09/17 NSR 72 - Personally Reviewed  ECG     08/06/17 NSR 86- Personally Reviewed  Cardiac Studies   Cath: 08/06/17  Conclusion   Conclusions: 1. Severe, diffuse native coronary artery disease. 2. Widely patent LIMA-LAD with minimal runoff beyond the distal anastomosis. 3. Patent but poorly visualized SVG-rPDA. Further attempts to engage SVG were not made due to declining respiratory status in the setting of severely elevated left and right heart pressures. 4. Severely elevated left and right heart filling pressures. 5. Severe pulmonary hypertension. 6. Low normal to reduced cardiac output.  Recommendations: 1. Transfer to cardiac ICU for aggressive diuresis and BiPAP (if needed) for shortness of breath in the setting of acute decompensated systolic heart failure. 2. Optimize evidence-based heart failure therapy. If patient does not respond well to diuresis, consultation with advanced heart failure service will need to be considered. 3. If patient develops chest pain or has continued dyspnea when euvolemic, repeat catheterization to better visualize SVG->rPDA could be considered. 4. Aggressive secondary prevention of CAD.      Patient Profile     67 y.o. female with a hx of CAD s/p CABG in 2014, DM II on insulin pump, HLD, HTN, and diabetic neuropathy who initially presented to the hospital on 08/02/17 with with ear pain and mastoiditis, resuscitated with IV fluids causing acute pulmonary edema and non-STEMI. The patient transferred for treatment of heart failure and possible cardiac catheterization.  Assessment & Plan    1. Acute on chronic systolic and diastolic heart failure: -196lb today, down 1 lb from yesterday. Baseline admission weight 205lb -Total output yesterday 1.8L, remains net  negative 9.8L since admission  -Reports breathing  is much improved, no LE swelling  -LVEF per outside echo 30-35% -Currently on Lasix 40mg  PO BID -Metoprolol succinate 25mg  tab -Spironolactone 12.5mg  added yesterday, 08/08/17 -Renal function elevated, however improving 1.50>1.49>1.65>1.34 today  -Add ACE/ARB today ?  2. NSTEMI: -Cath completed 08/06/17, to medically treat at this point, continue risk reduction therapies -Denies chest pain -On ASA, BB, Plavix and high intensity statin  3. DM II: -SSI, per IM -Discussion regarding follow up appointment with DM coordinator post discharge   Signed, Georgie Chard, NP  08/09/2017, 9:27 AM    For questions or updates, please contact   Please consult [email or web address] for contact info under Cardiology/STEMI.  Patient seen, examined. Available data reviewed. Agree with findings, assessment, and plan as outlined by Georgie Chard, NP.  The patient is sitting up in a chair at the bedside.  She feels good and wants to go home.  She is a bit weak when she is up and about.  Her breathing is greatly improved.  On exam she is alert, oriented, in no distress.  Lung fields are clear, JVP normal, heart regular rate and rhythm with no murmur or gallop, abdomen is soft and nontender, extremities without edema.  From a cardiac perspective I think she is stable for discharge.  She is tolerating a regimen of metoprolol succinate, Spironolactone, and furosemide.  I would probably hold off on starting an ACE or ARB until outpatient follow-up to reduce her risk of acute kidney injury.  She clearly will benefit from these medicines long-term and this should be initiated if her creatinine is stable when she is seen in discharge.  Will arrange follow-up in .    Tonny Bollman, M.D. 08/09/2017 1:13 PM

## 2017-08-09 NOTE — Care Management Note (Addendum)
Case Management Note Per previous note: Cloyde Reams, BSN, RN Case Manager-Orientation [phone] 08/03/2017, 3:04 PM  Patient Details  Name: Kiara [name] MRN: [id number] Date of Birth: Oct 08, 1950  Subjective/Objective:   Admitted with congestive heart failure.          Action/Plan:  In to speak with patient, prior to admission patient lived at home with spouse and mother.  PCP is Majel Homer.  Uses [facility] in [place], [place] for short-term medications only.  Home DME: CPAP.  Patient stated she has a scale but does not weigh daily; RN advised the importance of weighing.  Patient does her own cooking and does not eat salt.  Denies inability to afford medication or food.  Has used [facility] [employer]; does not mind using them again if needed post discharge.  NCM will continue to follow for discharge needs.             Expected Discharge Plan:  Home/Self Care  Discharge planning Services  CM Consult   Status of Service:  In process, will continue to follow ---  Received referral for Outpatient Physical therapy and HH OT, In to speak with patient.  Discussed referral for [facility] OT. Patient does not think she needs HH OT at this time.  But would like Outpatient Physical Therapy.  Offered Choice and selected [facility] for Outpatient Physical Therapy. Home DME: Rollator, cane.  Cloyde Reams, BSN, RN Case Manager-Orientation [phone] 08/09/2017, 11:31 AM    Faxed Outpatient Orders to [facility] at [phone].  Call placed to [facility] outpatient rehab at [phone] left message.  Cloyde Reams, BSN, RN Case Manager-Orientation [phone] 08/09/2017 4:17PM

## 2017-08-09 NOTE — Discharge Summary (Signed)
Physician Discharge Summary  Kiara [name]  TML:[id number]  DOB: 1951-04-11  DOA: 08/02/2017  PCP: Majel Homer, MD  Admit date: 08/02/2017  Discharge date: 08/09/2017  Admitted From: Home  Disposition: Home  Recommendations for Outpatient Follow-up:  1. Follow up with PCP in 1-2 weeks 2. Please obtain BMP/CBC in one week to monitor Hgb and Cr  3. Follow up with cardiology in 2 weeks  Discharge Condition: Stable  CODE STATUS: Full code  Diet recommendation: Heart Healthy / Carb Modified  Brief/Interim Summary:  For full details see H&P/progress note but in brief, Kiara [name] is a 67 y.o. female with medical history significant of CAD s/p CABG in 2014 here at [facility] following cardiologist in [place], DM on insulin pump, amputation was hospitalized on 12/28 at [facility] for acute mastoiditis due to pseudomonas, has been on cipro ear drops and cipro iv since and cipro iv changed to zosyn iv. She feels her ear is improved. However after admission she became more sob and gained significant weight up to 221 lbs from 173 lbs. Swelling diffusely and starting to get blisters to her left leg from the swelling. She has no h/o CHF in her past. She was put on lasix 40mg  iv and did not improved. Her cardiologist there was called who advised outpt cath. Her trop went up to 1.4 and bnp was markedly elevated She also had an echo there which showed no wall motion at the apex with EF in the 30s. Patient was transferred to [facility] for cardiac evaluation. Patient underwent cardiac cath 08/06/17 with findings of severe native CAD no stent were placed and recommendations of agressive medical therapy was made and optimize treatment for heart failure. Patient was aggressively diuresed with IV lasix, she down ~ 22 lb, and negative ~ 10 L during hospital stay. She was weaned off O2 and has clinically improved. Patient was treated with Zosyn for mastoiditis, which completed 14 days of therapy. Patient was cleared by cardiology for  discharge.  Subjective:  Patient seen and examined, she is off O2, doing well, swelling significantly decrease. Continues with good diuresis. No acute events.  Discharge Diagnoses/Hospital Course:  Principal Problem:  Acute CHF (congestive heart failure) (HCC)  Active Problems:  Type 1 diabetes (HCC)  Essential hypertension  Anxiety  Coronary atherosclerosis due to severely calcified coronary lesion  PVD (peripheral vascular disease) (HCC)  S/P CABG x 2  NSTEMI (non-ST elevated myocardial infarction) (HCC)  CHF (congestive heart failure) (HCC)  Acute respiratory failure with hypoxia (HCC)  SOB (shortness of breath)   Acute on chronic systolic and diastolic CHF (congestive heart failure)  -She was aggressively diuresed, down 22 lb and ~ 10 L during hospital stay. Patient will remain on Lasix and aldactone on discharge, she is to follow with cardiology. Would hold ACE/ARB's for now as Cr was elevated but improving. Recommend to check Cr in 1 week and if stable consider ACE/ARB.  NSTEMI (non-ST elevated myocardial infarction)  -Currently pain-free  -Continue medical therapy for cardiology recommendation ASA, Plavix and statin  -Patient is now s/p heart cath with findings of severe native CAD with subtotally occluded LAD as well as chronic total occlusions of OM1 and mid RCA  -Stable  Acute respiratory failure with hypoxia - resolved  -secondary to edema  Type 1 diabetes -CBGs labile  -Patient is on insulin pump, resume home dose of insulin.  -She was treated with Levamir and SSI while inpatient, CBG's where labile  -Follow up with PCP as outpatient  Essential hypertension  -BP stable  -On metoprolol  Anxiety  -Continue klonopin at bedtime  -Follow up with PCP  Coronary atherosclerosis due to severely calcified coronary lesion  -Patient underwent heart cath 1/7 per above  OM/mastoiditits on right - Stable  -Completed antibioti 14 days of antibiotic therapy with Levaquin and Zosyn   -Completer course of Ciprodex for 14 days  All other chronic medical condition were stable during the hospitalization.  Patient was seen by physical therapy, recommending outpatient PT  On the day of the discharge the patient's vitals were stable, and no other acute medical condition were reported by patient. the patient was felt safe to be discharge to home  Discharge Instructions  You were cared for by a hospitalist during your hospital stay. If you have any questions about your discharge medications or the care you received while you were in the hospital after you are discharged, you can call the unit and asked to speak with the hospitalist on call if the hospitalist that took care of you is not available. Once you are discharged, your primary care physician will handle any further medical issues. Please note that NO REFILLS for any discharge medications will be authorized once you are discharged, as it is imperative that you return to your primary care physician (or establish a relationship with a primary care physician if you do not have one) for your aftercare needs so that they can reassess your need for medications and monitor your lab values.      Discharge Instructions     Call MD for: difficulty breathing, headache or visual disturbances  Complete by: As directed    Call MD for: extreme fatigue  Complete by: As directed    Call MD for: hives  Complete by: As directed    Call MD for: persistant dizziness or light-headedness  Complete by: As directed    Call MD for: persistant nausea and vomiting  Complete by: As directed    Call MD for: redness, tenderness, or signs of infection (pain, swelling, redness, odor or green/yellow discharge around incision site)  Complete by: As directed    Call MD for: severe uncontrolled pain  Complete by: As directed    Call MD for: temperature >100.4  Complete by: As directed    Diet - low sodium heart healthy  Complete by: As directed    Increase  activity slowly  Complete by: As directed           Allergies as of 08/09/2017      Reactions   Bactrim [sulfamethoxazole-trimethoprim] Rash   Codeine Nausea And Vomiting   Mobic [meloxicam]    swelling   Morphine And Related Nausea Only, Other (See Comments)   confusion   Silvadene [silver Sulfadiazine]    Red hot rash and blisters   Adhesive [tape] Rash   Band aid brand  laxex   Sulfamethoxazole-trimethoprim Rash                Medication List      STOP taking these medications    benazepril 10 MG tablet  Commonly known as: LOTENSIN   metoprolol tartrate 25 MG tablet  Commonly known as: LOPRESSOR       TAKE these medications    aspirin EC 81 MG tablet  Take 81 mg by mouth daily.   atorvastatin 80 MG tablet  Commonly known as: LIPITOR  Take 80 mg by mouth daily.   clonazePAM 1 MG tablet  Commonly known as: Scarlette Calico  Take 0.5 tablets (0.5 mg total) by mouth at bedtime.  What changed: how much to take   clopidogrel 75 MG tablet  Commonly known as: PLAVIX  Take 1 tablet (75 mg total) by mouth daily.  Start taking on: 08/10/2017   furosemide 40 MG tablet  Commonly known as: LASIX  Take 1 tablet (40 mg total) by mouth 2 (two) times daily.   gabapentin 800 MG tablet  Commonly known as: NEURONTIN  Take 800 mg by mouth 3 (three) times daily.   insulin aspart 100 UNIT/ML injection  Commonly known as: novoLOG  1.25 Units 12 Am-7 Am, 1.75 u/hr 7 AM Til 12 AM, Bolus 1 unit for every 15 carbs, 1 Unit for every 50 points greater than 150  Insulin pump   levocetirizine 5 MG tablet  Commonly known as: XYZAL  Take 5 mg by mouth daily. TAKE 1 TABLET EVERY DAY (NEEDS APPOINTMENT AND LABS)   metoprolol succinate 25 MG 24 hr tablet  Commonly known as: TOPROL-XL  Take 1 tablet (25 mg total) by mouth daily.  Start taking on: 08/10/2017   PARoxetine 20 MG tablet  Commonly known as: PAXIL  Take 20 mg by mouth at bedtime.   polyethylene glycol packet  Commonly known as:  MIRALAX / GLYCOLAX  Take 17 g by mouth daily as needed.   PROAIR HFA 108 (90 Base) MCG/ACT inhaler  Generic drug: albuterol  Inhale 2 puffs into the lungs every 4 (four) hours as needed.   PROBIOTIC-10 PO  Take 1 tablet by mouth daily.   ranitidine 300 MG tablet  Commonly known as: ZANTAC  Take 300 mg by mouth daily. TAKE 1 TABLET EVERY DAY   spironolactone 25 MG tablet  Commonly known as: ALDACTONE  Take 0.5 tablets (12.5 mg total) by mouth daily.  Start taking on: 08/10/2017   Vitamin D 2000 units tablet  Take 2,000 Units by mouth daily.          Follow-up Information     Laurann Montana, PA-C Follow up on 08/22/2017.    Specialties: Cardiology, Radiology  Why: Your appointment will be on 08/22/17 at 130pm with Ronie Spies, Physician Assistant with Dr. Purvis Sheffield. Please arrive to your appointment at 115pm.  Contact information:  [address]  [place] [address]  [phone]        Majel Homer, MD. Schedule an appointment as soon as possible for a visit in 1 week(s).    Specialty: Internal Medicine  Why: Hospital follow up  Contact information:  [address]  [place] [address]  [phone]               Allergies  Allergen Reactions  . Bactrim [Sulfamethoxazole-Trimethoprim] Rash  . Codeine Nausea And Vomiting  . Mobic [Meloxicam]     swelling  . Morphine And Related Nausea Only and Other (See Comments)    confusion  . Silvadene [Silver Sulfadiazine]     Red hot rash and blisters  . Adhesive [Tape] Rash    Band aid brand  laxex  . Sulfamethoxazole-Trimethoprim Rash   Consultations:  Cardiology  Procedures/Studies:  Imaging Results      Discharge Exam:      Vitals:   08/08/17 2200 08/09/17 0551  BP: (!) 119/52 (!) 142/45  Pulse: 69 91  Resp:  19  Temp:  98 F (36.7 C)  SpO2:  98%         Vitals:   08/08/17 1937 08/08/17 2200 08/09/17 0500 08/09/17  0551  BP: 122/60 (!) 119/52  (!) 142/45  Pulse: (!) 106 69  91  Resp: 18    19  Temp: 97.8 F (36.6 C)   98 F (36.7 C)  TempSrc: Oral   Oral  SpO2: 98%   98%  Weight:   89 kg (196 lb 4.8 oz)   Height:       General: Pt is alert, awake, not in acute distress  Cardiovascular: RRR, S1/S2  Respiratory: CTA bilaterally  Abdominal: Soft, NT, ND,  Extremities: B/L LE edema, trace  The results of significant diagnostics from this hospitalization (including imaging, microbiology, ancillary and laboratory) are listed below for reference.   Microbiology:         Recent Results (from the past 240 hour(s))  MRSA PCR Screening Status: None   Collection Time: 08/06/17 1:31 PM  Result Value Ref Range Status   MRSA by PCR NEGATIVE NEGATIVE Final    Comment: The GeneXpert MRSA Assay (FDA  approved for NASAL specimens  only), is one component of a  comprehensive MRSA colonization  surveillance program. It is not  intended to diagnose MRSA  infection nor to guide or  monitor treatment for  MRSA infections.    Labs:  BNP (last 3 results)  Recent Labs (within last 365 days)                  Basic Metabolic Panel:  Last Labs                                                                                                          Liver Function Tests:  Last Labs                                       Last Labs      Last Labs      CBC:  Last Labs                                                                                          Cardiac Enzymes:  Last Labs                            BNP:  Last Labs      CBG:  Last Labs                               D-Dimer  Recent Labs (last 2 labs)      Hgb A1c  Recent Labs (last 2 labs)  Lipid Profile  Recent Labs (last 2 labs)      Thyroid function studies   Recent Labs (last 2 labs)      Anemia work up  [employer] (last 2 labs)      Urinalysis  Labs (Brief)                                                                                          Sepsis Labs  Last Labs      Microbiology         Recent Results (from the past 240 hour(s))  MRSA PCR Screening Status: None   Collection Time: 08/06/17 1:31 PM  Result Value Ref Range Status   MRSA by PCR NEGATIVE NEGATIVE Final    Comment: The GeneXpert MRSA Assay (FDA  approved for NASAL specimens  only), is one component of a  comprehensive MRSA colonization  surveillance program. It is not  intended to diagnose MRSA  infection nor to guide or  monitor treatment for  MRSA infections.    Time coordinating discharge: 32 minutes  SIGNED:  Latrelle Dodrill, MD  [facility]  08/09/2017, 2:06 PM

## 2017-08-10 NOTE — Care Management Note (Signed)
Case Management Note  Patient Details  Name: Carleena Neupert MRN: [id number] Date of Birth: 04/28/1951  Spoke with Angelica Chessman at [facility] at [phone] and verified that they did receive referral faxed for Outpatient PT for patient.  Yancey Flemings, RN  Nurse Case Manager-Orientation [phone] 08/10/2017, 3:14 PM

## 2017-08-20 ENCOUNTER — Encounter: Payer: Self-pay | Admitting: Physician Assistant

## 2017-08-20 NOTE — Progress Notes (Signed)
Cardiology Office Note    Date:  08/22/2017  ID:  Elodia Florence, DOB Jun 03, 1951, MRN [id number] PCP:  Allie Dimmer, MD  Cardiologist:  Bronson Ing  Chief Complaint: f/u CHF, MI  History of Present Illness:  Kiara [name] is a 67 y.o. female with history of CAD s/p CABG in 2014, DM II on insulin pump, HLD, HTN, diabetic neuropathy, chronic combined CHF, carotid artery disease, CKD stage III, anemia who presents for post-hospital followup. She initially presented to the hospital on 08/02/17 with with ear pain and mastoiditis, resuscitated with IV fluids with subsequent acute pulmonary edema and non-STEMI with peak troponin of 3.44. She underwent R/LHC showing diffuse native CAD, patent LIMA-LAD, poorly visualized SVG-rPDA (aborted due to declining resp status), severely elevated L/R filling pressures, severe pulmonary HTN. Aggressive medical therapy was recommended. Cardiology notes indicate echo at [facility] showed EF of 30-35%, previously down from 40-45% in 2014. Last carotid study in 2014 showed 0-07% RICA, 62-26% LICA. Hosp labs notable for AKI with Cr up to 1.73 (1.34 at dc, 1-1.2 in 2014), Hgb 10.4 (nadir 8.5), albumin 2.5, alk phos 139, AST/ALT wnl. Benazepril was stopped due to AKI.  Echo from [facility] reviewed from 1/3 - tech difficult, mild LVH, EF 30-35% with global hypokinesis, grade 2 DD, mild LAE, mild aortic sclerosis, mild MR, moderate pulm HTN, mild-mod TR.  She returns for follow-up overall feeling significantly better. She reports gradual fluid weight loss since discharge, finally settling to around 193lb. She is no longer SOB. No further chest pain or orthopnea. Has been seeing BPs around 110/60 at home.   Past Medical History:  Diagnosis Date  . Anemia   . Anxiety   . Arthritis   . Carotid artery disease ([place])    a. Last carotid study in 2014 showed 3-33% RICA, 54-56% LICA.  Marland Kitchen Chronic combined systolic (congestive) and diastolic (congestive) heart failure  (HCC)    a. EF 40-45% in 2014. b. EF 30-35% in 07/2017.  . CKD (chronic kidney disease), stage III ([place])   . Coronary artery disease    a. s/p CABG 2014. b. Cath 07/2017 for NSTEMI -> med rx.  . Diabetes mellitus ([place])   . Diabetic peripheral neuropathy ([place])   . Eczema   . GERD (gastroesophageal reflux disease)   . H/O hiatal hernia   . Headache(784.0)    allergies, sinus  . Hyperlipidemia   . Hypertension   . Myocardial infarction ([place])   . PONV (postoperative nausea and vomiting)   . Sleep apnea   . Umbilical hernia     Past Surgical History:  Procedure Laterality Date  . ABDOMINAL HYSTERECTOMY    . 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 CARDIAC CATHETERIZATION    . CARPAL TUNNEL RELEASE    . CESAREAN SECTION  1983  . CORONARY ARTERY BYPASS GRAFT N/A 04/11/2013   Procedure: CORONARY ARTERY BYPASS GRAFTING (CABG);  Surgeon: Gaye Pollack, MD;  Location: [facility];  Service: Open Heart Surgery;  Laterality: N/A;  . EYE SURGERY Bilateral    laser  . FRACTURE SURGERY Left 2005  . RIGHT/LEFT HEART CATH AND CORONARY/GRAFT ANGIOGRAPHY N/A 08/06/2017   Procedure: RIGHT/LEFT HEART CATH AND CORONARY/GRAFT ANGIOGRAPHY;  Surgeon: Nelva Bush, MD;  Location: [facility];  Service: Cardiovascular;  Laterality: N/A;    Current Medications: Current Meds  Medication Sig  . albuterol (PROAIR HFA) 108 (90 BASE) MCG/ACT inhaler Inhale 2 puffs into the lungs every 4 (four)  hours as needed.   Marland Kitchen aspirin EC 81 MG tablet Take 81 mg by mouth daily.   Marland Kitchen atorvastatin (LIPITOR) 80 MG tablet Take 80 mg by mouth daily.   . Cholecalciferol (VITAMIN D) 2000 units tablet Take 2,000 Units by mouth daily.  . clonazePAM (KLONOPIN) 1 MG tablet Take 0.5 tablets (0.5 mg total) by mouth at bedtime. (Patient taking differently: Take 1 mg by mouth at bedtime. )  . clopidogrel (PLAVIX) 75 MG tablet Take 1 tablet (75 mg total) by mouth daily.  . furosemide (LASIX) 40 MG tablet Take 1 tablet (40 mg total) by  mouth 2 (two) times daily.  Marland Kitchen gabapentin (NEURONTIN) 800 MG tablet Take 800 mg by mouth 3 (three) times daily.   . insulin aspart (NOVOLOG) 100 UNIT/ML injection 1.25 Units 12 Am-7 Am,  1.75 u/hr 7 AM  Til 12 AM, Bolus 1 unit for every 15 carbs, 1 Unit for every 50 points greater than 150 Insulin pump  . levocetirizine (XYZAL) 5 MG tablet Take 5 mg by mouth daily. TAKE 1 TABLET EVERY DAY (NEEDS APPOINTMENT AND LABS)  . metoprolol succinate (TOPROL-XL) 25 MG 24 hr tablet Take 1 tablet (25 mg total) by mouth daily.  Marland Kitchen PARoxetine (PAXIL) 20 MG tablet Take 20 mg by mouth at bedtime.   . polyethylene glycol (MIRALAX / GLYCOLAX) packet Take 17 g by mouth daily as needed.   . Probiotic Product (PROBIOTIC-10 PO) Take 1 tablet by mouth daily.  . ranitidine (ZANTAC) 300 MG tablet Take 300 mg by mouth daily. TAKE 1 TABLET EVERY DAY  . spironolactone (ALDACTONE) 25 MG tablet Take 0.5 tablets (12.5 mg total) by mouth daily. (Patient taking differently: Take 25 mg by mouth every other day. )      Allergies:   Bactrim [sulfamethoxazole-trimethoprim]; Codeine; Mobic [meloxicam]; Morphine and related; Silvadene [silver sulfadiazine]; Adhesive [tape]; and Sulfamethoxazole-trimethoprim   Social History   Socioeconomic History  . Marital status: Married    Spouse name: None  . Number of children: None  . Years of education: None  . Highest education level: None  Social Needs  . Financial resource strain: None  . Food insecurity - worry: None  . Food insecurity - inability: None  . Transportation needs - medical: None  . Transportation needs - non-medical: None  Occupational History  . None  Tobacco Use  . Smoking status: Former Smoker    Years: 25.00    Types: Cigarettes    Last attempt to quit: 05/30/2010    Years since quitting: 7.2  . Smokeless tobacco: Never Used  . Tobacco comment: STOPPED 2007  Substance and Sexual Activity  . Alcohol use: No  . Drug use: No  . Sexual activity: None    Other Topics Concern  . None  Social History Narrative  . None     Family History:  Family History  Problem Relation Age of Onset  . Heart attack Mother   . Heart disease Mother   . Hypertension Mother   . Heart attack Brother   . Heart disease Brother   . Hypertension Brother     ROS:   Please see the history of present illness.  All other systems are reviewed and otherwise negative.    PHYSICAL EXAM:   VS:  BP 140/68 (BP Location: Left Arm)   Pulse 82   Ht '5\' 3"'  (1.6 m)   Wt 194 lb (88 kg)   SpO2 95%   BMI 34.37 kg/m   BMI: Body  mass index is 34.37 kg/m. GEN: Well nourished, well developed obese WF in no acute distress  HEENT: normocephalic, atraumatic Neck: no JVD, carotid bruits, or masses Cardiac: RRR; no murmurs, rubs, or gallops, no edema  Respiratory:  clear to auscultation bilaterally, normal work of breathing GI: soft, nontender, nondistended, + BS MS: no deformity or atrophy  Skin: warm and dry, no rash. Right groin cath site without hematoma, ecchymosis, or bruit. Neuro:  Alert and Oriented x 3, Strength and sensation are intact, follows commands Psych: euthymic mood, full affect  Wt Readings from Last 3 Encounters:  08/22/17 194 lb (88 kg)  08/09/17 196 lb 4.8 oz (89 kg)  12/31/13 192 lb (87.1 kg)      Studies/Labs Reviewed:   EKG:  EKG was ordered today and personally reviewed by me and demonstrates NSR 74bpm, continued TWI I, avL, mild ST depression (upsloped T) in II, III, avF, similar to recent tracings. ST depression less pronounced today.  Recent Labs: 08/02/2017: ALT 33; B Natriuretic Peptide 1,347.3 08/08/2017: Magnesium 2.0 08/09/2017: BUN 22; Creatinine, Ser 1.34; Hemoglobin 10.4; Platelets 368; Potassium 3.9; Sodium 138   Lipid Panel No results found for: CHOL, TRIG, HDL, CHOLHDL, VLDL, LDLCALC, LDLDIRECT  Additional studies/ records that were reviewed today include: Summarized above.    ASSESSMENT & PLAN:   1. CAD - currently  stable/improved, continue current ASA/Plavix, beta blocker and statin. Will update lipid panel today along with follow-up CMET given recent liver abnormalities with alk phos and albumin. I am OK with the lipid panel being nonfasting as the patient lives far away and is hesitant to have to return for this, and does not want to put it off, either.  2. Chronic combined CHF - discussed this in depth with patient. She appears euvolemic. States her weight got all the way up to 218 prior to recent admission, likely significant fluid. Will repeat labs today to help guide whether we can resume some ACEI/ARB. Blood pressure recheck by me was 136/62 so there is some room to re-add, although with home pressures of 110/60 we may need to exercise some caution. Will await labs to help guide decision. Her BP will also be followed in cardiac rehab. Reviewed 2g sodium restriction, 2L fluid restriction, daily weights with patient. Once HF regimen is considered optimized, will need to consider repeat echo in about 3 months to reassess LVEF. If this remains 35% or less, she may need referral to EP to consider ICD. Also today with regard to her pulm HTN, she confirms her sleep apnea is followed by Allie Dimmer. 3. Suspected CKD stage III - recheck Cr today. 4. Carotid artery disease - overdue for repeat vascular duplex, will arrange. 5. Hyperlipidemia - see above. 6. Recent anemia - recheck Hgb today.  Disposition: F/u with Dr. Bronson Ing in 4 weeks for anticipated reassessment of med titration.   Medication Adjustments/Labs and Tests Ordered: Current medicines are reviewed at length with the patient today.  Concerns regarding medicines are outlined above. Medication changes, Labs and Tests ordered today are summarized above and listed in the Patient Instructions accessible in Encounters.   Signed, Charlie Pitter, PA-C  08/22/2017 2:08 PM    [facility] [place]. [place], [place] [address] Ph: [phone]; Fax [phone]

## 2017-08-22 ENCOUNTER — Other Ambulatory Visit: Payer: Self-pay

## 2017-08-22 ENCOUNTER — Encounter (HOSPITAL_COMMUNITY): Payer: Self-pay

## 2017-08-22 ENCOUNTER — Encounter: Payer: Self-pay | Admitting: Physician Assistant

## 2017-08-22 ENCOUNTER — Other Ambulatory Visit (HOSPITAL_COMMUNITY)
Admission: RE | Admit: 2017-08-22 | Discharge: 2017-08-22 | Disposition: A | Discharge status: Home / Self Care | Payer: Medicare PPO | Source: Ambulatory Visit | Attending: Physician Assistant | Admitting: Physician Assistant

## 2017-08-22 ENCOUNTER — Ambulatory Visit: Payer: 59 | Admitting: Physician Assistant

## 2017-08-22 ENCOUNTER — Telehealth: Payer: Self-pay | Admitting: Physician Assistant

## 2017-08-22 ENCOUNTER — Ambulatory Visit (HOSPITAL_COMMUNITY): Payer: Medicare PPO | Attending: Internal Medicine

## 2017-08-22 VITALS — BP 140/68 | HR 82 | Ht 63.0 in | Wt 194.0 lb

## 2017-08-22 DIAGNOSIS — Z7409 Other reduced mobility: Secondary | ICD-10-CM | POA: Diagnosis present

## 2017-08-22 DIAGNOSIS — I214 Non-ST elevation (NSTEMI) myocardial infarction: Secondary | ICD-10-CM

## 2017-08-22 DIAGNOSIS — I5042 Chronic combined systolic (congestive) and diastolic (congestive) heart failure: Secondary | ICD-10-CM | POA: Diagnosis not present

## 2017-08-22 DIAGNOSIS — E785 Hyperlipidemia, unspecified: Secondary | ICD-10-CM | POA: Insufficient documentation

## 2017-08-22 DIAGNOSIS — N183 Chronic kidney disease, stage 3 unspecified: Secondary | ICD-10-CM

## 2017-08-22 DIAGNOSIS — D649 Anemia, unspecified: Secondary | ICD-10-CM

## 2017-08-22 DIAGNOSIS — I2510 Atherosclerotic heart disease of native coronary artery without angina pectoris: Secondary | ICD-10-CM | POA: Diagnosis not present

## 2017-08-22 DIAGNOSIS — I6523 Occlusion and stenosis of bilateral carotid arteries: Secondary | ICD-10-CM | POA: Insufficient documentation

## 2017-08-22 DIAGNOSIS — R6889 Other general symptoms and signs: Secondary | ICD-10-CM

## 2017-08-22 DIAGNOSIS — M6281 Muscle weakness (generalized): Secondary | ICD-10-CM | POA: Insufficient documentation

## 2017-08-22 DIAGNOSIS — Z79899 Other long term (current) drug therapy: Secondary | ICD-10-CM

## 2017-08-22 LAB — LIPID PANEL
Cholesterol: 183 mg/dL (ref 0–200)
HDL: 36 mg/dL — ABNORMAL LOW (ref >40)
LDL CALC: 118 mg/dL — AB (ref 0–99)
TRIGLYCERIDES: 143 mg/dL (ref <150)
Total CHOL/HDL Ratio: 5.1 RATIO
VLDL: 29 mg/dL (ref 0–40)

## 2017-08-22 LAB — CBC WITH DIFFERENTIAL/PLATELET
Basophils Absolute: 0.1 10*3/uL (ref 0.0–0.1)
Basophils Relative: 1 %
Eosinophils Absolute: 0.3 10*3/uL (ref 0.0–0.7)
Eosinophils Relative: 4 %
HEMATOCRIT: 37.7 % (ref 36.0–46.0)
HEMOGLOBIN: 11.6 g/dL — AB (ref 12.0–15.0)
LYMPHS ABS: 2 10*3/uL (ref 0.7–4.0)
LYMPHS PCT: 29 %
MCH: 29.7 pg (ref 26.0–34.0)
MCHC: 30.8 g/dL (ref 30.0–36.0)
MCV: 96.4 fL (ref 78.0–100.0)
Monocytes Absolute: 0.3 10*3/uL (ref 0.1–1.0)
Monocytes Relative: 4 %
NEUTROS ABS: 4.2 10*3/uL (ref 1.7–7.7)
NEUTROS PCT: 62 %
Platelets: 362 10*3/uL (ref 150–400)
RBC: 3.91 MIL/uL (ref 3.87–5.11)
RDW: 14.4 % (ref 11.5–15.5)
WBC: 6.8 10*3/uL (ref 4.0–10.5)

## 2017-08-22 LAB — COMPREHENSIVE METABOLIC PANEL
ALK PHOS: 94 U/L (ref 38–126)
ALT: 20 U/L (ref 14–54)
AST: 28 U/L (ref 15–41)
Albumin: 3.4 g/dL — ABNORMAL LOW (ref 3.5–5.0)
Anion gap: 10 (ref 5–15)
BILIRUBIN TOTAL: 0.5 mg/dL (ref 0.3–1.2)
BUN: 44 mg/dL — ABNORMAL HIGH (ref 6–20)
CALCIUM: 9.6 mg/dL (ref 8.9–10.3)
CO2: 31 mmol/L (ref 22–32)
CREATININE: 1.54 mg/dL — AB (ref 0.44–1.00)
Chloride: 96 mmol/L — ABNORMAL LOW (ref 101–111)
GFR, EST AFRICAN AMERICAN: 40 mL/min — AB (ref >60)
GFR, EST NON AFRICAN AMERICAN: 35 mL/min — AB (ref >60)
Glucose, Bld: 270 mg/dL — ABNORMAL HIGH (ref 65–99)
Potassium: 5.1 mmol/L (ref 3.5–5.1)
SODIUM: 137 mmol/L (ref 135–145)
TOTAL PROTEIN: 8 g/dL (ref 6.5–8.1)

## 2017-08-22 MED ORDER — METOPROLOL SUCCINATE ER 25 MG PO TB24: 25.0000 mg | ORAL_TABLET | Freq: Every day | ORAL | 3 refills | Status: DC

## 2017-08-22 MED ORDER — SPIRONOLACTONE 25 MG PO TABS: 12.5000 mg | ORAL_TABLET | Freq: Every day | ORAL | 0 refills | Status: DC

## 2017-08-22 MED ORDER — SPIRONOLACTONE 25 MG PO TABS: 12.5000 mg | ORAL_TABLET | Freq: Every day | ORAL | 3 refills | Status: DC

## 2017-08-22 MED ORDER — ATORVASTATIN CALCIUM 80 MG PO TABS: 80.0000 mg | ORAL_TABLET | Freq: Every day | ORAL | 3 refills | Status: DC

## 2017-08-22 MED ORDER — FUROSEMIDE 40 MG PO TABS: 40.0000 mg | ORAL_TABLET | Freq: Two times a day (BID) | ORAL | 3 refills | Status: DC

## 2017-08-22 NOTE — Addendum Note (Signed)
Addended by: Abelino Derrick R on: 08/22/2017 03:28 PM   Modules accepted: Orders

## 2017-08-22 NOTE — Telephone Encounter (Signed)
Pt would like the referral for her cardiac rehab to be sent to [place], [place]. It's closer to her home

## 2017-08-22 NOTE — Therapy (Signed)
[facility] [address] [place], [place], [address] Phone: [phone]   Fax:  [phone]  Physical Therapy Evaluation/ Discharge Summary  Patient Details  Name: Kiara [name] MRN: [id number] Date of Birth: 09-Dec-1950 Referring Provider: Patrecia Pour, Christean Grief, MD   Encounter Date: 08/22/2017  PT End of Session - 08/22/17 1841    Visit Number  1    Number of Visits  1    Authorization Type  [employer]    PT Start Time  1520    PT Stop Time  1610    PT Time Calculation (min)  50 min    Activity Tolerance  Patient tolerated treatment well    Behavior During Therapy  [facility] for tasks assessed/performed       Past Medical History:  Diagnosis Date  . Anemia   . Anxiety   . Arthritis   . Carotid artery disease ([place])    a. Last carotid study in 2014 showed 7-00% RICA, 17-49% LICA.  Marland Kitchen Chronic combined systolic (congestive) and diastolic (congestive) heart failure (HCC)    a. EF 40-45% in 2014. b. EF 30-35% in 07/2017.  . CKD (chronic kidney disease), stage III ([place])   . Coronary artery disease    a. s/p CABG 2014. b. Cath 07/2017 for NSTEMI -> med rx.  . Diabetes mellitus ([place])   . Diabetic peripheral neuropathy ([place])   . Eczema   . GERD (gastroesophageal reflux disease)   . H/O hiatal hernia   . Headache(784.0)    allergies, sinus  . Hyperlipidemia   . Hypertension   . Myocardial infarction ([place])   . PONV (postoperative nausea and vomiting)   . Sleep apnea   . Umbilical hernia     Past Surgical History:  Procedure Laterality Date  . ABDOMINAL HYSTERECTOMY    . 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 CARDIAC CATHETERIZATION    . CARPAL TUNNEL RELEASE    . CESAREAN SECTION  1983  . CORONARY ARTERY BYPASS GRAFT N/A 04/11/2013   Procedure: CORONARY ARTERY BYPASS GRAFTING (CABG);  Surgeon: Gaye Pollack, MD;  Location: [facility];  Service: Open Heart Surgery;  Laterality: N/A;  . EYE SURGERY Bilateral    laser  . FRACTURE  SURGERY Left 2005  . RIGHT/LEFT HEART CATH AND CORONARY/GRAFT ANGIOGRAPHY N/A 08/06/2017   Procedure: RIGHT/LEFT HEART CATH AND CORONARY/GRAFT ANGIOGRAPHY;  Surgeon: Nelva Bush, MD;  Location: [facility];  Service: Cardiovascular;  Laterality: N/A;    There were no vitals filed for this visit.   Subjective Assessment - 08/22/17 1555    Subjective  Patient stated she was recently admitted and discharged at [facility] for treatment of mastoiditis and an ear infection. While there she went into acute exacerbation of congestive heart failure due to fluid overload. She states she worked with physical therapist there and was eventually discharged and told she would go to cardiac rehab. She has been to her cardiologist since discharging form the hospital on 08/10/17 and they informed her that she has an EF of 30-35% and told her she would be referred to cardiac rehab. She states she is concerned about participating in PT before completing rehab for her heart with cardiac monitoring and specialist. She denies any falls and states she feels like she is moving at about 70% of her full functioning level and is limited mostly by her decreased cardiovascular endurance.    Pertinent History  R foot 4th/5th  toes removed, none on left, DM, carpel tunnel BUE, C section 1x, l shoulder arthroscopic debridement, CABG 2 grafts, Cataracts B eyes, silent MI befoce CABG (in 2014)    Patient Stated Goals  build your enduracne and make my heart stronger    Currently in Pain?  No/denies         [facility] PT Assessment - 08/22/17 0001      Assessment   Medical Diagnosis  Congestive Heart Failure Exacerbation    Referring Provider  Patrecia Pour, Christean Grief, MD    Onset Date/Surgical Date  08/02/17 hospitalized for mastoiditis    Next MD Visit  unsure      Precautions   Precautions  None      Restrictions   Weight Bearing Restrictions  No      Balance Screen   Has the patient fallen in the past 6 months   No    Has the patient had a decrease in activity level because of a fear of falling?   Yes    Is the patient reluctant to leave their home because of a fear of falling?   No      Home Film/video editor residence    Living Arrangements  Spouse/significant other;Parent caregiver for mother    Available Help at Discharge  Family    Type of [place] to enter    Entrance Stairs-Number of Steps  2 curbs    Entrance Stairs-Rails  None    Home Layout  One level    [place] - 2 wheels      Prior Function   Level of Independence  Independent    Vocation  On disability    Vocation Requirements  drove school bus for 30 years    Leisure  working on [employer] and remodeling, like to be able to help more with it,       Cognition   Overall Cognitive Status  Within Functional Limits for tasks assessed      Observation/Other Assessments   Observations  Patient with 4th/5th digits removed from right foot    Focus on Therapeutic Outcomes (FOTO)   -- 6MWT      Sensation   Light Touch  Impaired by gross assessment    Additional Comments  diabetic neuropathy BLE      Functional Tests   Functional tests  Single leg stance      Single Leg Stance   Comments  RLE: 7 seconds, LLE: 12 seconds      Posture/Postural Control   Posture/Postural Control  No significant limitations      AROM   Overall AROM   Within functional limits for tasks performed      Strength   Right Hip Flexion  4-/5    Right Hip Extension  4/5    Right Hip ABduction  4/5    Left Hip Flexion  4-/5    Left Hip Extension  4/5    Left Hip ABduction  4/5    Right/Left Knee  Right;Left    Right Knee Flexion  4+/5    Right Knee Extension  4+/5    Left Knee Flexion  4+/5    Left Knee Extension  4+/5    Right Ankle Dorsiflexion  4/5    Left Ankle Dorsiflexion  4+/5      Ambulation/Gait   Ambulation/Gait  Yes    Ambulation/Gait Assistance  7:  Independent     Ambulation Distance (Feet)  580 Feet 6 MWT    Gait velocity  0.48 m/s    Stairs  Yes    Stairs Assistance  6: Modified independent (Device/Increase time)    Stair Management Technique  Alternating pattern;One rail Right;One rail Left;Forwards    Number of Stairs  4    Height of Stairs  6      6 Minute Walk- Baseline   6 Minute Walk- Baseline  yes    BP (mmHg)  120/74    HR (bpm)  73    02 Sat (%RA)  98 %    Modified Borg Scale for Dyspnea  0- Nothing at all    Perceived Rate of Exertion (Borg)  6-      6 Minute walk- Post Test   6 Minute Walk Post Test  yes    BP (mmHg)  118/70    HR (bpm)  91    02 Sat (%RA)  98 %    Modified Borg Scale for Dyspnea  0.5- Very, very slight shortness of breath    Perceived Rate of Exertion (Borg)  13- Somewhat hard      6 minute walk test results    Aerobic Endurance Distance Walked  580      Objective measurements completed on examination: See above findings.     PT Education - 08/22/17 1836    Education provided  Yes    Education Details  Patient was educated on slow gait velocity and risk for falling. She was educated on findings of hip weakness and decreased balance with SLS. We discussed the benefits of skilled PT services and that the patient can come to PT for strengthening to improve functional mobility when she feels ready. Patient discussed concerns for starting physical therapy before she completes cardiac rehab and would like to see cardiac rehab specialist before initiating POC with PT at this time. After a chart review, I did not see an order/referral to cardiac rehab at this time. I encouraged her to contact her cardiologist office to get a referral for cardiac rehab as the patient stated they told her she was going to see a therapist for cardiac rehab.    Person(s) Educated  Patient    Methods  Explanation    Comprehension  Verbalized understanding       Plan - 08/22/17 1843    Clinical Impression Statement  Ms. Eunola arrived  for initial evaluation for outpatient physical therapy after recent hospital admission for congestive heart failure exacerbation. Upon arrival she reported she was under the impression that she was referred to cardiac rehab not physical therapy. She was agreeable to participate in PT evaluation for functional strength and endurance testing. She has slow gait velocity of 0.48 m/s placing her at the limited community ambulatory status, an increased risk of falls, and increased risk for hospitalization. She was limited with single limb stance for BLE and has general weakness of BLE. She was educated on benefits of skilled PT to improve her balance and functional strength. Ms. Delaluz would like to hold off on participating in PT as she would like to follow up with cardiac rehab and get focus on improving her heart health and cardiovascular endurance with cardiac specialist. This was a one-time evaluation and discharge.    History and Personal Factors relevant to plan of care:  CABG, DM, 4th/5th digit amputation on right foot, recent exacerbation of CHF in hospital secondary to fluid overload  Clinical Presentation  Stable    Clinical Presentation due to:  6MWT, SLS, clinical judgement    Clinical Decision Making  Low    PT Next Visit Plan  Discharging and not initiating POC at this time.    Consulted and Agree with Plan of Care  Patient       Patient will benefit from skilled therapeutic intervention in order to improve the following deficits and impairments:     Visit Diagnosis: Activity intolerance  Decreased functional mobility and endurance  Muscle weakness (generalized)     Problem List Patient Active Problem List   Diagnosis Date Noted  . Acute CHF (congestive heart failure) ([place]) 08/02/2017  . NSTEMI (non-ST elevated myocardial infarction) ([place]) 08/02/2017  . CHF (congestive heart failure) ([place]) 08/02/2017  . Acute respiratory failure with hypoxia ([place]) 08/02/2017  . SOB (shortness  of breath)   . 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  . Coronary atherosclerosis due to severely calcified coronary lesion 03/21/2013  . Hyperlipidemia 03/21/2013  . PVD (peripheral vascular disease) ([place]) 03/21/2013  . Unstable chest pain due to insufficient blood supply to heart ([place]) 03/20/2013  . Decreased circulation 03/20/2013  . Type 1 diabetes ([place]) 03/20/2013  . Sleep apnea 03/20/2013  . Essential hypertension 03/20/2013  . GERD (gastroesophageal reflux disease) 03/20/2013  . Diabetic retinopathy ([place]) 03/20/2013  . Anxiety 03/20/2013  . Umbilical hernia 38/25/0539     PHYSICAL THERAPY DISCHARGE SUMMARY  Visits from Start of Care: 1  Current functional level related to goals / functional outcomes: Ms. Prindle arrived for initial evaluation for outpatient physical therapy after recent hospital admission for congestive heart failure exacerbation. Upon arrival she reported she was under the impression that she was referred to cardiac rehab not physical therapy. She was agreeable to participate in PT evaluation for functional strength and endurance testing. She has slow gait velocity of 0.48 m/s placing her at the limited community ambulatory status, an increased risk of falls, and increased risk for hospitalization. She was limited with single limb stance for BLE and has general weakness of BLE. She was educated on benefits of skilled PT to improve her balance and functional strength. Ms. Bozard would like to hold off on participating in PT as she would like to follow up with cardiac rehab and get focus on improving her heart health and cardiovascular endurance with cardiac specialist. This was a one-time evaluation and discharge.    Remaining deficits: Patient needs cardiac rehab   Education / Equipment: Patient was educated on slow gait velocity and risk for falling. She was educated on findings of hip weakness and decreased balance with SLS. We discussed the benefits of skilled PT services and  that the patient can come to PT for strengthening to improve functional mobility when she feels ready. Patient discussed concerns for starting physical therapy before she completes cardiac rehab and would like to see cardiac rehab specialist before initiating POC with PT at this time. After a chart review, I did not see an order/referral to cardiac rehab at this time. I encouraged her to contact her cardiologist office to get a referral for cardiac rehab as the patient stated they told her she was going to see a therapist for cardiac rehab.    Plan: Patient agrees to discharge.  Patient goals were not met. Patient is being discharged due to the patient's request.  ?????      Kipp Brood, PT, DPT Physical Therapist with [facility]  08/22/2017 7:00 PM  [facility] [place], [place], [address] Phone: [phone]   Fax:  [phone]  Name: Linea Calles MRN: [id number] Date of Birth: 05/09/1951

## 2017-08-22 NOTE — Patient Instructions (Addendum)
Medication Instructions:  Your physician recommends that you continue on your current medications as directed. Please refer to the Current Medication list given to you today.   Labwork: TODAY   Testing/Procedures: Your physician has requested that you have a carotid duplex. This test is an ultrasound of the carotid arteries in your neck. It looks at blood flow through these arteries that supply the brain with blood. Allow one hour for this exam. There are no restrictions or special instructions.    Follow-Up: Your physician recommends that you schedule a follow-up appointment in: 4 WEEKS    Any Other Special Instructions Will Be Listed Below (If Applicable).     If you need a refill on your cardiac medications before your next appointment, please call your pharmacy.   For patients with congestive heart failure and weakened heart muscles, we give them these special instructions:  1. Follow a low-salt diet - you are allowed no more than 2,000mg  of sodium per day. Watch your fluid intake. In general, you should not be taking in more than 2 liters of fluid per day (no more than 8 standard glasses per day). This includes sources of water in foods like soup, coffee, tea, milk, etc. 2. Weigh yourself on the same scale at same time of day and keep a log. 3. Call your doctor: (Anytime you feel any of the following symptoms)  - 3lb weight gain overnight or 5lb within a few days - Shortness of breath, with or without a dry hacking cough  - Swelling in the hands, feet or stomach  - If you have to sleep on extra pillows at night in order to breathe   IT IS IMPORTANT TO LET YOUR DOCTOR KNOW EARLY ON IF YOU ARE HAVING SYMPTOMS SO WE CAN HELP YOU!

## 2017-08-23 MED ORDER — EZETIMIBE 10 MG PO TABS: 10.0000 mg | ORAL_TABLET | Freq: Every day | ORAL | 3 refills | Status: DC

## 2017-08-23 NOTE — Telephone Encounter (Signed)
-----   Message from Laurann Montana, [place] sent at 08/22/2017  7:14 PM EST ----- Please let patient know labs reviewed. A few recommendations: - LDL is 118. Please find out if patient has been compliant with atorvastatin. It's been on med list since at least 05/2017 but this LDL is quite high so I just want her to double check it's in her regimen that she's been taking. If she had not been taking it faithfully, please recheck lipid profile fasting in 6 weeks. If she has been taking it, please arrange followup with lipid clinic to discuss PCSK9 injections. If patient does not wish to consider injectable medicines, can add Zetia 10mg  daily and recheck fasting lipid profile and LFTs in 6 weeks. - blood sugar is quite high, needs to have good f/u with primary care to address - Her BUN/CR (kidney function) are slightly elevated above where she was when she left the hospital - she looked well diuresed in clinic today. I would recommend we decrease her Lasix to 40mg  daily, and may take 1 extra tablet daily as needed for weight gain of 3-5lb or increased shortness of breath/swelling. Increase Toprol to 50mg  daily - would recommend she take 2 of her current tabs for now as we may continue to titrate medications.   Would recommend to move recheck appt to about a week (7-10 days is OK) to repeat BMET and consider further medication titration at that time. I would not restart the benazepril just yet since her kidney function looks a little dry, but this still can be considered at follow-up visit. Will cc Dr. Purvis Sheffield to make him aware as well - complicated patient, seen today for post hospital f/u.  [name] Dunn PA-C

## 2017-08-23 NOTE — Telephone Encounter (Signed)
Spoke with pt. I informed her of all of her lab results as well as  the medication changes. She voiced understanding. She stated she has been taking her atorvastatin daily as she was told to do. She stated she would rather add the zetia 10 mg daily for now, before trying the injectable medication due to the distance to drive to [place]. I sent her Zetia to [facility] per pt's request. Placed lab orders. I will forward note to front office to reschedule her sooner than 4 weeks. She also stated she will schedule appointment with her pcp to discuss her blood sugar. I am currently working on getting her cardiac rehab referral sent to [facility] in [place].

## 2017-08-28 ENCOUNTER — Telehealth: Payer: Self-pay

## 2017-08-28 MED ORDER — FUROSEMIDE 40 MG PO TABS: 40.0000 mg | ORAL_TABLET | Freq: Every day | ORAL | 3 refills | Status: DC

## 2017-08-28 MED ORDER — METOPROLOL SUCCINATE ER 25 MG PO TB24: 25.0000 mg | ORAL_TABLET | Freq: Two times a day (BID) | ORAL | 3 refills | Status: DC

## 2017-08-28 NOTE — Telephone Encounter (Signed)
I informed pt of medication changes during previous phone conversation. She voiced understanding. Changed her medication dosing.

## 2017-08-28 NOTE — Telephone Encounter (Signed)
-----   Message from Laurann [place], [place] sent at 08/24/2017  8:40 AM EST ----- Regarding: Other med changes to Metoprolol and Lasix Kiara [name], I saw the orders come through from this patient from the result note on Wednesday but did not see that the med changes for Metoprolol or Lasix were made on her med list. Can you please make sure this happens? Thanks! [name]

## 2017-08-29 ENCOUNTER — Ambulatory Visit (HOSPITAL_COMMUNITY)
Admission: RE | Admit: 2017-08-29 | Discharge: 2017-08-29 | Disposition: A | Discharge status: Home / Self Care | Payer: Medicare PPO | Source: Ambulatory Visit | Attending: Physician Assistant | Admitting: Physician Assistant

## 2017-08-29 DIAGNOSIS — I6523 Occlusion and stenosis of bilateral carotid arteries: Secondary | ICD-10-CM | POA: Insufficient documentation

## 2017-08-31 ENCOUNTER — Ambulatory Visit: Payer: Medicare PPO | Admitting: Cardiovascular Disease

## 2017-08-31 ENCOUNTER — Encounter: Payer: Self-pay | Admitting: Cardiovascular Disease

## 2017-08-31 ENCOUNTER — Other Ambulatory Visit: Payer: Self-pay

## 2017-08-31 VITALS — BP 142/73 | HR 70 | Ht 63.0 in | Wt 198.0 lb

## 2017-08-31 DIAGNOSIS — E785 Hyperlipidemia, unspecified: Secondary | ICD-10-CM | POA: Diagnosis not present

## 2017-08-31 DIAGNOSIS — I6523 Occlusion and stenosis of bilateral carotid arteries: Secondary | ICD-10-CM

## 2017-08-31 DIAGNOSIS — I10 Essential (primary) hypertension: Secondary | ICD-10-CM | POA: Diagnosis not present

## 2017-08-31 DIAGNOSIS — Z79899 Other long term (current) drug therapy: Secondary | ICD-10-CM | POA: Diagnosis not present

## 2017-08-31 DIAGNOSIS — I25708 Atherosclerosis of coronary artery bypass graft(s), unspecified, with other forms of angina pectoris: Secondary | ICD-10-CM | POA: Diagnosis not present

## 2017-08-31 DIAGNOSIS — N183 Chronic kidney disease, stage 3 unspecified: Secondary | ICD-10-CM

## 2017-08-31 DIAGNOSIS — I5042 Chronic combined systolic (congestive) and diastolic (congestive) heart failure: Secondary | ICD-10-CM | POA: Diagnosis not present

## 2017-08-31 DIAGNOSIS — D649 Anemia, unspecified: Secondary | ICD-10-CM

## 2017-08-31 DIAGNOSIS — I214 Non-ST elevation (NSTEMI) myocardial infarction: Secondary | ICD-10-CM | POA: Diagnosis not present

## 2017-08-31 MED ORDER — SACUBITRIL-VALSARTAN 24-26 MG PO TABS: 1.0000 | ORAL_TABLET | Freq: Two times a day (BID) | ORAL | 0 refills | Status: DC

## 2017-08-31 MED ORDER — SACUBITRIL-VALSARTAN 24-26 MG PO TABS: 1.0000 | ORAL_TABLET | Freq: Two times a day (BID) | ORAL | 3 refills | Status: DC

## 2017-08-31 MED ORDER — CLOPIDOGREL BISULFATE 75 MG PO TABS: 75.0000 mg | ORAL_TABLET | Freq: Every day | ORAL | 3 refills | Status: DC

## 2017-08-31 MED ORDER — METOPROLOL SUCCINATE ER 25 MG PO TB24: 25.0000 mg | ORAL_TABLET | Freq: Two times a day (BID) | ORAL | 3 refills | Status: DC

## 2017-08-31 MED ORDER — ROSUVASTATIN CALCIUM 40 MG PO TABS: 40.0000 mg | ORAL_TABLET | Freq: Every day | ORAL | 6 refills | Status: DC

## 2017-08-31 NOTE — Progress Notes (Signed)
SUBJECTIVE: The patient presents to reestablish care with me.  I last evaluated her in June 2015.  Past medical history is significant for CAD s/p CABG in 2014, DM II on insulin pump, HLD, HTN, diabetic neuropathy, chronic combined CHF, carotid artery disease, CKD stage III, and anemia.  She initially presented to the hospital on 08/02/17 with with ear pain and mastoiditis, resuscitated with IV fluids with subsequent acute pulmonary edema and non-STEMI with peak troponin of 3.44. She underwent R/LHC showing diffuse native CAD, patent LIMA-LAD, poorly visualized SVG-rPDA (aborted due to declining resp status), severely elevated L/R filling pressures, severe pulmonary HTN. Aggressive medical therapy was recommended.  Cardiology notes indicate echo at [facility] showed EF of 30-35%, previously down from 40-45% in 2014.  Last carotid study in 2014 showed 4-50% RICA, 38-88% LICA.   Hosp labs notable for AKI with Cr up to 1.73 (1.34 at dc, 1-1.2 in 2014), Hgb 10.4 (nadir 8.5), albumin 2.5, alk phos 139, AST/ALT wnl. Benazepril was stopped due to AKI.  Echo from [facility] [place] reviewed from 1/3 - tech difficult, mild LVH, EF 30-35% with global hypokinesis, grade 2 DD, mild LAE, mild aortic sclerosis, mild MR, moderate pulm HTN, mild-mod TR.  She saw Melina Copa PA-C on 08/22/17.  Lipid panel on 08/22/17 showed elevated LDL of 118.  Hemoglobin 11.6.  BUN 44, creatinine 1.54.  Both of these have increased from 08/09/17.  Lasix was reduced to 40 mg daily and Toprol-XL was increased to 50 mg daily.  She is being treated for malignant otitis externa by a physician with [facility].  She is on ciprofloxacin.  A basic metabolic panel was checked on 08/28/17 which demonstrated BUN 32, creatinine 1.4, GFR 38 mL/min.  She denies chest pain, palpitations, shortness of breath, orthopnea, leg swelling, and paroxysmal nocturnal dyspnea.  She has been on Lipitor 80 mg for at least 4 months and has been  complaining of bilateral leg and thigh pain.   Social history: Her daughter is Earnest Bailey, a former Glass blower/designer with [employer]: As per "subjective", otherwise negative.  Allergies  Allergen Reactions  . Bactrim [Sulfamethoxazole-Trimethoprim] Rash  . Codeine Nausea And Vomiting  . Mobic [Meloxicam]     swelling  . Morphine And Related Nausea Only and Other (See Comments)    confusion  . Silvadene [Silver Sulfadiazine]     Red hot rash and blisters  . Adhesive [Tape] Rash    Band aid brand  laxex  . Sulfamethoxazole-Trimethoprim Rash    Current Outpatient Medications  Medication Sig Dispense Refill  . albuterol (PROAIR HFA) 108 (90 BASE) MCG/ACT inhaler Inhale 2 puffs into the lungs every 4 (four) hours as needed.     [place] Kitchen aspirin EC 81 MG tablet Take 81 mg by mouth daily.     Mar[place] Kitchen atorvastatin (LIPITOR) 80 MG tablet Take 1 tablet (80 mg total) by mouth daily. 90 tablet 3  . Cholecalciferol (VITAMIN D) 2000 units tablet Take 2,000 Units by mouth daily.    . ciprofloxacin (CIPRO) 500 MG tablet Take 500 mg by mouth 2 (two) times daily.    . ciprofloxacin-dexamethasone (CIPRODEX) OTIC suspension Place 4 drops into the right ear 2 (two) times daily.    . clonazePAM (KLONOPIN) 1 MG tablet Take 0.5 tablets (0.5 mg total) by mouth at bedtime. (Patient taking differently: Take 1 mg by mouth at bedtime. ) 90 tablet 0  . clopidogrel (PLAVIX) 75 MG tablet Take 1 tablet (  75 mg total) by mouth daily. 30 tablet 0  . ezetimibe (ZETIA) 10 MG tablet Take 1 tablet (10 mg total) by mouth daily. 90 tablet 3  . furosemide (LASIX) 40 MG tablet Take 1 tablet (40 mg total) by mouth daily. Take 40 mg daily. May take and extra 40 mg if you gain 3-5 lbs. 180 tablet 3  . gabapentin (NEURONTIN) 800 MG tablet Take 800 mg by mouth 3 (three) times daily.     . insulin aspart (NOVOLOG) 100 UNIT/ML injection 1.25 Units 12 Am-7 Am,  1.75 u/hr 7 AM  Til 12 AM, Bolus 1 unit for every 15 carbs, 1 Unit for  every 50 points greater than 150 Insulin pump    . levocetirizine (XYZAL) 5 MG tablet Take 5 mg by mouth daily. TAKE 1 TABLET EVERY DAY (NEEDS APPOINTMENT AND LABS)    . metoprolol succinate (TOPROL-XL) 25 MG 24 hr tablet Take 1 tablet (25 mg total) by mouth 2 (two) times daily. 180 tablet 3  . PARoxetine (PAXIL) 20 MG tablet Take 20 mg by mouth at bedtime.     . polyethylene glycol (MIRALAX / GLYCOLAX) packet Take 17 g by mouth daily as needed.     . Probiotic Product (PROBIOTIC-10 PO) Take 1 tablet by mouth daily.    . ranitidine (ZANTAC) 300 MG tablet Take 300 mg by mouth daily. TAKE 1 TABLET EVERY DAY    . spironolactone (ALDACTONE) 25 MG tablet Take 0.5 tablets (12.5 mg total) by mouth daily. 45 tablet 3   No current facility-administered medications for this visit.     Past Medical History:  Diagnosis Date  . Anemia   . Anxiety   . Arthritis   . Carotid artery disease ([place])    a. Last carotid study in 2014 showed 2-24% RICA, 82-50% LICA.  [place] Kitchen Chronic combined systolic (congestive) and diastolic (congestive) heart failure (HCC)    a. EF 40-45% in 2014. b. EF 30-35% in 07/2017.  . CKD (chronic kidney disease), stage III ([place])   . Coronary artery disease    a. s/p CABG 2014. b. Cath 07/2017 for NSTEMI -> med rx.  . Diabetes mellitus ([place])   . Diabetic peripheral neuropathy ([place])   . Eczema   . GERD (gastroesophageal reflux disease)   . H/O hiatal hernia   . Headache(784.0)    allergies, sinus  . Hyperlipidemia   . Hypertension   . Myocardial infarction ([place])   . PONV (postoperative nausea and vomiting)   . Sleep apnea   . Umbilical hernia     Past Surgical History:  Procedure Laterality Date  . ABDOMINAL HYSTERECTOMY    . 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 CARDIAC CATHETERIZATION    . CARPAL TUNNEL RELEASE    . CESAREAN SECTION  1983  . CORONARY ARTERY BYPASS GRAFT N/A 04/11/2013   Procedure: CORONARY ARTERY BYPASS GRAFTING (CABG);  Surgeon: Gaye Pollack, MD;   Location: [facility];  Service: Open Heart Surgery;  Laterality: N/A;  . EYE SURGERY Bilateral    laser  . FRACTURE SURGERY Left 2005  . RIGHT/LEFT HEART CATH AND CORONARY/GRAFT ANGIOGRAPHY N/A 08/06/2017   Procedure: RIGHT/LEFT HEART CATH AND CORONARY/GRAFT ANGIOGRAPHY;  Surgeon: Nelva Bush, MD;  Location: [facility];  Service: Cardiovascular;  Laterality: N/A;    Social History   Socioeconomic History  . Marital status: Married    Spouse name: Not on file  . Number of children: Not on file  .  Years of education: Not on file  . Highest education level: Not on file  Social Needs  . Financial resource strain: Not on file  . Food insecurity - worry: Not on file  . Food insecurity - inability: Not on file  . Transportation needs - medical: Not on file  . Transportation needs - non-medical: Not on file  Occupational History  . Not on file  Tobacco Use  . Smoking status: Former Smoker    Years: 25.00    Types: Cigarettes    Last attempt to quit: 05/30/2010    Years since quitting: 7.2  . Smokeless tobacco: Never Used  . Tobacco comment: STOPPED 2007  Substance and Sexual Activity  . Alcohol use: No  . Drug use: No  . Sexual activity: Not on file  Other Topics Concern  . Not on file  Social History Narrative  . Not on file     Vitals:   08/31/17 1136  BP: (!) 142/73  Pulse: 70  SpO2: 97%  Weight: 198 lb (89.8 kg)  Height: '5\' 3"'  (1.6 m)    Wt Readings from Last 3 Encounters:  08/31/17 198 lb (89.8 kg)  08/22/17 194 lb (88 kg)  08/09/17 196 lb 4.8 oz (89 kg)     PHYSICAL EXAM General: NAD HEENT: Normal. Neck: No JVD, no thyromegaly. Lungs: Diminished sounds throughout, no crackles or wheezes. CV: Regular rate and rhythm, normal S1/S2, no S3/S4, no murmur. No pretibial or periankle edema.  No carotid bruit.   Abdomen: Soft, nontender, no distention.  Neurologic: Alert and oriented.  Psych: Normal affect. Skin: Normal. Musculoskeletal: No gross  deformities.    ECG: Most recent ECG reviewed.   Labs: Lab Results  Component Value Date/Time   K 5.1 08/22/2017 02:47 PM   BUN 44 (H) 08/22/2017 02:47 PM   CREATININE 1.54 (H) 08/22/2017 02:47 PM   ALT 20 08/22/2017 02:47 PM   HGB 11.6 (L) 08/22/2017 02:47 PM     Lipids: Lab Results  Component Value Date/Time   LDLCALC 118 (H) 08/22/2017 02:48 PM   CHOL 183 08/22/2017 02:48 PM   TRIG 143 08/22/2017 02:48 PM   HDL 36 (L) 08/22/2017 02:48 PM       ASSESSMENT AND PLAN: 1.  Coronary disease with history of CABG: Symptomatically stable.  Continue aspirin, Plavix, Toprol-XL 25 mg twice daily, and statin therapy.  Due to bilateral leg pain and myalgias, I will try to alleviate symptoms by switching Lipitor 80 mg to Crestor 40 mg.  I will refill Toprol-XL and Plavix.  2.  Chronic combined CHF: Euvolemic.  Continue Lasix 40 mg daily.  She has been instructed to take an extra 40 mg should weight increased by 3 pounds in 24 hours.  I will continue Toprol-XL 25 mg twice daily and spironolactone 12.5 mg every other day.  As BUN and creatinine are improving, I will start Entresto 24-26 mg twice daily.  She is having a basic metabolic panel checked next week by her PCP. After at least 3 months of being on optimal medical therapy, I will reassess cardiac function with an echocardiogram.  If LVEF remains in the 30-35% range, I will make a referral to EP for an ICD.  3.  Chronic kidney disease stage III: BUN down to 32 from 44, creatinine down to 1.4 from 1.54.  Lasix was reduced to 40 mg daily. She is having a basic metabolic panel checked next week by her PCP. I will monitor renal function given  initiation of Entresto.  4.  Carotid artery disease: Mild bilateral carotid artery disease with stenoses of less than 50%.  Continue aspirin and statin.  I may consider repeating in 3 years.  5.  Hyperlipidemia: LDL elevated, 118.  Currently on Lipitor 80 mg and now on Zetia 10 mg.  Due to bilateral  leg myalgias as noted above, I will switch Lipitor to Crestor 40 mg I will repeat lipids in 3 months.  6.  Anemia: Hemoglobin 11.6.  7.  Hypertension: Blood pressure is mildly elevated.  I will monitor given initiation of Entresto.    Disposition: Follow up 3 months  Time spent: 40 minutes, of which greater than 50% was spent reviewing symptoms, relevant blood tests and studies, and discussing management plan with the patient.    Kate Sable, M.D., F.A.C.C.

## 2017-08-31 NOTE — Patient Instructions (Addendum)
Medication Instructions:   Stop Lipitor.  Begin Crestor 40mg  daily.  Begin Entresto 24/26mg  twice a day.  Continue all current medications.  Labwork:  Please ask primary doctor to send [place] copy of labs.  Lipids - due in 3 months - will mail reminder when time.  Testing/Procedures: None  Follow-Up: 3 months   Any Other Special Instructions Will Be Listed Below (If Applicable).  If you need a refill on your cardiac medications before your next appointment, please call your pharmacy.

## 2017-09-04 ENCOUNTER — Telehealth: Payer: Self-pay | Admitting: *Deleted

## 2017-09-04 NOTE — Telephone Encounter (Signed)
[employer] approved Entresto 24/26mg  - good until 09/04/2019.  Notification faxed to [facility] in [place], [place].

## 2017-09-21 ENCOUNTER — Ambulatory Visit: Payer: Medicare PPO | Admitting: Cardiovascular Disease

## 2017-10-01 ENCOUNTER — Other Ambulatory Visit: Payer: Self-pay | Admitting: Cardiovascular Disease

## 2017-10-01 NOTE — Telephone Encounter (Signed)
LMTCB

## 2017-10-01 NOTE — Telephone Encounter (Signed)
TOOK crestor this morning.  [facility] on [place], [place]

## 2017-10-02 NOTE — Telephone Encounter (Signed)
LMTCB

## 2017-11-02 ENCOUNTER — Telehealth: Payer: Self-pay

## 2017-11-02 NOTE — Telephone Encounter (Signed)
RN from [facility] rehab contacted office stating patients BP was 118/34 after having patient sit awhile and drink some water it was 111/38. Patient also reports her legs going numb and falling asleep. [facility] did send patient home. Consulted with Dr. Purvis Sheffield he stated to have patient check her BP at home if numbers are still running low patient should be evaluated in the ED. Dr. Purvis Sheffield also stated patient should schedule an appt to see APP in [place]. [place] nurse verbalized understanding and would have patient call back to schedule an appt.

## 2017-12-11 ENCOUNTER — Ambulatory Visit: Payer: Medicare PPO | Admitting: Cardiovascular Disease

## 2017-12-11 ENCOUNTER — Other Ambulatory Visit: Payer: Self-pay

## 2017-12-11 ENCOUNTER — Encounter: Payer: Self-pay | Admitting: Cardiovascular Disease

## 2017-12-11 VITALS — BP 134/73 | HR 58 | Ht 63.0 in | Wt 202.0 lb

## 2017-12-11 DIAGNOSIS — I10 Essential (primary) hypertension: Secondary | ICD-10-CM

## 2017-12-11 DIAGNOSIS — N183 Chronic kidney disease, stage 3 unspecified: Secondary | ICD-10-CM

## 2017-12-11 DIAGNOSIS — M79605 Pain in left leg: Secondary | ICD-10-CM

## 2017-12-11 DIAGNOSIS — E785 Hyperlipidemia, unspecified: Secondary | ICD-10-CM

## 2017-12-11 DIAGNOSIS — M79604 Pain in right leg: Secondary | ICD-10-CM

## 2017-12-11 DIAGNOSIS — I428 Other cardiomyopathies: Secondary | ICD-10-CM

## 2017-12-11 DIAGNOSIS — D649 Anemia, unspecified: Secondary | ICD-10-CM | POA: Diagnosis not present

## 2017-12-11 DIAGNOSIS — I5042 Chronic combined systolic (congestive) and diastolic (congestive) heart failure: Secondary | ICD-10-CM

## 2017-12-11 DIAGNOSIS — I6523 Occlusion and stenosis of bilateral carotid arteries: Secondary | ICD-10-CM | POA: Diagnosis not present

## 2017-12-11 DIAGNOSIS — I25708 Atherosclerosis of coronary artery bypass graft(s), unspecified, with other forms of angina pectoris: Secondary | ICD-10-CM

## 2017-12-11 NOTE — Patient Instructions (Signed)
Medication Instructions:  Continue all current medications.  Labwork: none  Testing/Procedures:  Your physician has requested that you have an echocardiogram. Echocardiography is a painless test that uses sound waves to create images of your heart. It provides your doctor with information about the size and shape of your heart and how well your heart's chambers and valves are working. This procedure takes approximately one hour. There are no restrictions for this procedure.  (liimited with contrast)  Your physician has requested that you have an ankle brachial index (ABI). During this test an ultrasound and blood pressure cuff are used to evaluate the arteries that supply the arms and legs with blood. Allow thirty minutes for this exam. There are no restrictions or special instructions.  Office will contact with results via phone or letter.    Follow-Up: Your physician wants you to follow up in: 6 months.  You will receive a reminder letter in the mail one-two months in advance.  If you don't receive a letter, please call our office to schedule the follow up appointment   Any Other Special Instructions Will Be Listed Below (If Applicable).  If you need a refill on your cardiac medications before your next appointment, please call your pharmacy.

## 2017-12-11 NOTE — Progress Notes (Signed)
SUBJECTIVE: The patient presents for routine follow-up.  She denies exertional chest pain and shortness of breath.  She continues to experience bilateral leg aching even with the switch from Lipitor to Crestor.  She does fine on the elliptical but legs ache when she is in cardiac rehab and trying to use a treadmill.  She denies orthopnea and leg swelling.  She is a lot of stress in her life due to caring for her elderly mother who is 65 years old.  She is considering placing her in assisted living.  Labs 11/07/2017: BUN 31, creatinine 1.27, hemoglobin 10.9.  Lipids 10/08/2017: Total cholesterol 102, triglycerides 76, HDL 38.7, LDL 48.   Social history: Her daughter, Jeanice Lim, is a former employee with [employer]   Review of Systems: As per "subjective", otherwise negative.  Allergies  Allergen Reactions  . Bactrim [Sulfamethoxazole-Trimethoprim] Rash  . Codeine Nausea And Vomiting  . Mobic [Meloxicam]     swelling  . Morphine And Related Nausea Only and Other (See Comments)    confusion  . Silvadene [Silver Sulfadiazine]     Red hot rash and blisters  . Adhesive [Tape] Rash    Band aid brand  laxex  . Sulfamethoxazole-Trimethoprim Rash    Current Outpatient Medications  Medication Sig Dispense Refill  . albuterol (PROAIR HFA) 108 (90 BASE) MCG/ACT inhaler Inhale 2 puffs into the lungs every 4 (four) hours as needed.     Marland Kitchen aspirin EC 81 MG tablet Take 81 mg by mouth daily.     . Cholecalciferol (VITAMIN D) 2000 units tablet Take 2,000 Units by mouth daily.    . clonazePAM (KLONOPIN) 1 MG tablet Take 0.5 tablets (0.5 mg total) by mouth at bedtime. (Patient taking differently: Take 1 mg by mouth at bedtime. ) 90 tablet 0  . clopidogrel (PLAVIX) 75 MG tablet Take 1 tablet (75 mg total) by mouth daily. 90 tablet 3  . Coenzyme Q10 (COQ10) 100 MG CAPS Take by mouth.    . ezetimibe (ZETIA) 10 MG tablet Take 10 mg by mouth daily.    . furosemide (LASIX) 40 MG tablet Take 1  tablet (40 mg total) by mouth daily. Take 40 mg daily. May take and extra 40 mg if you gain 3-5 lbs. 180 tablet 3  . gabapentin (NEURONTIN) 800 MG tablet Take 800 mg by mouth 3 (three) times daily.     . insulin aspart (NOVOLOG) 100 UNIT/ML injection 1.25 Units 12 Am-7 Am,  1.75 u/hr 7 AM  Til 12 AM, Bolus 1 unit for every 15 carbs, 1 Unit for every 50 points greater than 150 Insulin pump    . levocetirizine (XYZAL) 5 MG tablet Take 5 mg by mouth daily. TAKE 1 TABLET EVERY DAY (NEEDS APPOINTMENT AND LABS)    . metoprolol succinate (TOPROL-XL) 25 MG 24 hr tablet Take 1 tablet (25 mg total) by mouth 2 (two) times daily. 180 tablet 3  . PARoxetine (PAXIL) 20 MG tablet Take 20 mg by mouth at bedtime.     . polyethylene glycol (MIRALAX / GLYCOLAX) packet Take 17 g by mouth daily as needed.     . Probiotic Product (PROBIOTIC-10 PO) Take 1 tablet by mouth daily.    . ranitidine (ZANTAC) 300 MG tablet Take 300 mg by mouth daily. TAKE 1 TABLET EVERY DAY    . sacubitril-valsartan (ENTRESTO) 24-26 MG Take 1 tablet by mouth 2 (two) times daily. 180 tablet 3  . spironolactone (ALDACTONE) 25 MG tablet Take  0.5 tablets (12.5 mg total) by mouth daily. 45 tablet 3  . rosuvastatin (CRESTOR) 40 MG tablet Take 1 tablet (40 mg total) by mouth daily. 30 tablet 6   No current facility-administered medications for this visit.     Past Medical History:  Diagnosis Date  . Anemia   . Anxiety   . Arthritis   . Carotid artery disease (HCC)    a. Last carotid study in 2014 showed 1-39% RICA, 40-59% LICA.  Marland Kitchen Chronic combined systolic (congestive) and diastolic (congestive) heart failure (HCC)    a. EF 40-45% in 2014. b. EF 30-35% in 07/2017.  . CKD (chronic kidney disease), stage III (HCC)   . Coronary artery disease    a. s/p CABG 2014. b. Cath 07/2017 for NSTEMI -> med rx.  . Diabetes mellitus (HCC)   . Diabetic peripheral neuropathy (HCC)   . Eczema   . GERD (gastroesophageal reflux disease)   . H/O hiatal hernia    . Headache(784.0)    allergies, sinus  . Hyperlipidemia   . Hypertension   . Myocardial infarction (HCC)   . PONV (postoperative nausea and vomiting)   . Sleep apnea   . Umbilical hernia     Past Surgical History:  Procedure Laterality Date  . ABDOMINAL HYSTERECTOMY    . 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 CARDIAC CATHETERIZATION    . CARPAL TUNNEL RELEASE    . CESAREAN SECTION  1983  . CORONARY ARTERY BYPASS GRAFT N/A 04/11/2013   Procedure: CORONARY ARTERY BYPASS GRAFTING (CABG);  Surgeon: Alleen Borne, MD;  Location: [facility] OR;  Service: Open Heart Surgery;  Laterality: N/A;  . EYE SURGERY Bilateral    laser  . FRACTURE SURGERY Left 2005  . RIGHT/LEFT HEART CATH AND CORONARY/GRAFT ANGIOGRAPHY N/A 08/06/2017   Procedure: RIGHT/LEFT HEART CATH AND CORONARY/GRAFT ANGIOGRAPHY;  Surgeon: Yvonne Kendall, MD;  Location: [facility] INVASIVE CV LAB;  Service: Cardiovascular;  Laterality: N/A;    Social History   Socioeconomic History  . Marital status: Married    Spouse name: Not on file  . Number of children: Not on file  . Years of education: Not on file  . Highest education level: Not on file  Occupational History  . Not on file  Social Needs  . Financial resource strain: Not on file  . Food insecurity:    Worry: Not on file    Inability: Not on file  . Transportation needs:    Medical: Not on file    Non-medical: Not on file  Tobacco Use  . Smoking status: Former Smoker    Years: 25.00    Types: Cigarettes    Last attempt to quit: 05/30/2010    Years since quitting: 7.5  . Smokeless tobacco: Never Used  . Tobacco comment: STOPPED 2007  Substance and Sexual Activity  . Alcohol use: No  . Drug use: No  . Sexual activity: Not on file  Lifestyle  . Physical activity:    Days per week: Not on file    Minutes per session: Not on file  . Stress: Not on file  Relationships  . Social connections:    Talks on phone: Not on file    Gets together: Not on file     Attends religious service: Not on file    Active member of club or organization: Not on file    Attends meetings of clubs or organizations: Not on file    Relationship status: Not  on file  . Intimate partner violence:    Fear of current or ex partner: Not on file    Emotionally abused: Not on file    Physically abused: Not on file    Forced sexual activity: Not on file  Other Topics Concern  . Not on file  Social History Narrative  . Not on file     Vitals:   12/11/17 0955  BP: 134/73  Pulse: (!) 58  SpO2: 100%  Weight: 202 lb (91.6 kg)  Height: 5\' 3"  (1.6 m)    Wt Readings from Last 3 Encounters:  12/11/17 202 lb (91.6 kg)  08/31/17 198 lb (89.8 kg)  08/22/17 194 lb (88 kg)     PHYSICAL EXAM General: NAD HEENT: Normal. Neck: No JVD, no thyromegaly. Lungs: Diminished sounds throughout, no crackles or wheezes. CV: Regular rate and rhythm, normal S1/S2, no S3/S4, no murmur. No pretibial or periankle edema.    Abdomen: Soft, nontender, no distention.  Neurologic: Alert and oriented.  Psych: Normal affect. Skin: Normal. Musculoskeletal: No gross deformities.    ECG: Most recent ECG reviewed.   Labs: Lab Results  Component Value Date/Time   K 5.1 08/22/2017 02:47 PM   BUN 44 (H) 08/22/2017 02:47 PM   CREATININE 1.54 (H) 08/22/2017 02:47 PM   ALT 20 08/22/2017 02:47 PM   HGB 11.6 (L) 08/22/2017 02:47 PM     Lipids: Lab Results  Component Value Date/Time   LDLCALC 118 (H) 08/22/2017 02:48 PM   CHOL 183 08/22/2017 02:48 PM   TRIG 143 08/22/2017 02:48 PM   HDL 36 (L) 08/22/2017 02:48 PM       ASSESSMENT AND PLAN:  1.  Coronary disease with history of CABG: Symptomatically stable.  Continue aspirin, Plavix (for a minimum of 1 year starting January 2019 for non-STEMI), Toprol-XL 25 mg twice daily, and Crestor 40 mg.   2.  Chronic combined CHF: Euvolemic.  Continue Lasix 40 mg daily.  She has been instructed to take an extra 40 mg should weight increased  by 3 pounds in 24 hours.  I will continue Toprol-XL 25 mg twice daily and spironolactone 12.5 mg every other day along with Entresto 24-26 mg twice daily..  As it has been 3 months of being on optimal medical therapy, I will reassess cardiac function with a limited echocardiogram.  If LVEF remains in the 30-35% range, I will make a referral to EP for an ICD.  3.  Chronic kidney disease stage III: BUN 31, creatinine 1.27 on 11/07/2017.  4.  Carotid artery disease: Mild bilateral carotid artery disease with stenoses of less than 50%.  Continue aspirin and statin.  I may consider repeating in 3 years.  5.  Hyperlipidemia: LDL down to 48 on 10/08/2017.    Continue Crestor 40 mg.  6.  Anemia: Hemoglobin 10.9 on 11/07/2017.  7.  Hypertension: Blood pressure is normal.  No changes to therapy.  8.  Bilateral leg pain: I will obtain ABIs.   Disposition: Follow up 6 months   Prentice Docker, M.D., F.A.C.C.

## 2017-12-19 ENCOUNTER — Other Ambulatory Visit: Payer: Self-pay | Admitting: Cardiovascular Disease

## 2017-12-19 ENCOUNTER — Ambulatory Visit (HOSPITAL_COMMUNITY): Payer: Medicare PPO

## 2017-12-19 DIAGNOSIS — I739 Peripheral vascular disease, unspecified: Secondary | ICD-10-CM

## 2017-12-21 ENCOUNTER — Telehealth: Payer: Self-pay | Admitting: *Deleted

## 2017-12-21 ENCOUNTER — Ambulatory Visit (HOSPITAL_COMMUNITY)
Admission: RE | Admit: 2017-12-21 | Discharge: 2017-12-21 | Disposition: A | Discharge status: Home / Self Care | Payer: Medicare PPO | Source: Ambulatory Visit | Attending: Cardiovascular Disease | Admitting: Cardiovascular Disease

## 2017-12-21 DIAGNOSIS — I110 Hypertensive heart disease with heart failure: Secondary | ICD-10-CM | POA: Insufficient documentation

## 2017-12-21 DIAGNOSIS — I428 Other cardiomyopathies: Secondary | ICD-10-CM | POA: Diagnosis not present

## 2017-12-21 DIAGNOSIS — I252 Old myocardial infarction: Secondary | ICD-10-CM | POA: Insufficient documentation

## 2017-12-21 DIAGNOSIS — Z951 Presence of aortocoronary bypass graft: Secondary | ICD-10-CM | POA: Insufficient documentation

## 2017-12-21 DIAGNOSIS — Z87891 Personal history of nicotine dependence: Secondary | ICD-10-CM | POA: Insufficient documentation

## 2017-12-21 DIAGNOSIS — I509 Heart failure, unspecified: Secondary | ICD-10-CM | POA: Insufficient documentation

## 2017-12-21 DIAGNOSIS — E119 Type 2 diabetes mellitus without complications: Secondary | ICD-10-CM | POA: Insufficient documentation

## 2017-12-21 MED ORDER — PERFLUTREN LIPID MICROSPHERE
1.0000 mL | INTRAVENOUS | Status: AC | PRN
  Administered 2017-12-21: 2 mL via INTRAVENOUS
  Filled 2017-12-21: qty 10

## 2017-12-21 NOTE — Telephone Encounter (Signed)
Notes recorded by Lesle Chris, LPN on 10/27/5186 at 3:38 PM EDT Patient notified. Copy to pmd. ------  Notes recorded by Lesle Chris, LPN on 11/13/6061 at 3:20 PM EDT Left message to return call.  ------  Notes recorded by Jonelle Sidle, MD on 12/21/2017 at 12:20 PM EDT Patient of Dr. Purvis Sheffield. He is currently out of the office. Results were forwarded my inbox for review. I reviewed the last office note. She has had improvement in LVEF, now in the range of 45 to 50%. No longer in range to consider ICD referral. Would continue medical therapy and keep follow-up with Dr. Purvis Sheffield.

## 2017-12-21 NOTE — Progress Notes (Signed)
*  PRELIMINARY RESULTS* Echocardiogram 2D Echocardiogram LIMITED WITH DEFINITY has been performed.  Jeryl Columbia 12/21/2017, 10:38 AM

## 2017-12-26 ENCOUNTER — Encounter: Payer: Self-pay | Admitting: *Deleted

## 2017-12-26 ENCOUNTER — Other Ambulatory Visit: Payer: Self-pay | Admitting: *Deleted

## 2017-12-26 DIAGNOSIS — E785 Hyperlipidemia, unspecified: Secondary | ICD-10-CM

## 2018-02-01 ENCOUNTER — Encounter: Payer: Self-pay | Admitting: *Deleted

## 2018-02-06 ENCOUNTER — Ambulatory Visit (INDEPENDENT_AMBULATORY_CARE_PROVIDER_SITE_OTHER): Payer: Medicare PPO

## 2018-02-06 DIAGNOSIS — I739 Peripheral vascular disease, unspecified: Secondary | ICD-10-CM | POA: Diagnosis not present

## 2018-02-06 DIAGNOSIS — M79604 Pain in right leg: Secondary | ICD-10-CM

## 2018-02-06 DIAGNOSIS — M79605 Pain in left leg: Secondary | ICD-10-CM

## 2018-02-12 ENCOUNTER — Telehealth: Payer: Self-pay | Admitting: *Deleted

## 2018-02-12 DIAGNOSIS — I739 Peripheral vascular disease, unspecified: Secondary | ICD-10-CM

## 2018-02-12 NOTE — Telephone Encounter (Signed)
-----   Message from Suresh A Koneswaran, MD sent at 02/12/2018  1:12 PM EDT ----- Blockages noted. Please make referral to Dr. Jonathan Berry. 

## 2018-02-12 NOTE — Telephone Encounter (Signed)
-----   Message from Laqueta Linden, MD sent at 02/12/2018  1:12 PM EDT ----- Blockages noted. Please make referral to Dr. Nanetta Batty.

## 2018-02-19 ENCOUNTER — Encounter: Payer: Self-pay | Admitting: Cardiovascular Disease

## 2018-02-19 ENCOUNTER — Ambulatory Visit: Payer: Medicare PPO | Admitting: Cardiovascular Disease

## 2018-02-19 VITALS — BP 98/60 | HR 69 | Ht 63.0 in | Wt 202.6 lb

## 2018-02-19 DIAGNOSIS — I2510 Atherosclerotic heart disease of native coronary artery without angina pectoris: Secondary | ICD-10-CM

## 2018-02-19 DIAGNOSIS — I5022 Chronic systolic (congestive) heart failure: Secondary | ICD-10-CM

## 2018-02-19 DIAGNOSIS — Z01818 Encounter for other preprocedural examination: Secondary | ICD-10-CM

## 2018-02-19 DIAGNOSIS — I739 Peripheral vascular disease, unspecified: Secondary | ICD-10-CM | POA: Diagnosis not present

## 2018-02-19 NOTE — Progress Notes (Signed)
  Cardiology Office Note   Date:  02/19/2018   ID:  Kiara [name], DOB 10/19/1950, MRN [id number]  PCP:  Buchanan, Linda, MD  Cardiologist:  Dr. Koneswaran  No chief complaint on file.     History of Present Illness: Kiara [name] is a 66 y.o. female who was referred by Dr. Koneswaran for evaluation and management of peripheral arterial disease. She has known history of coronary artery disease status post CABG, chronic systolic heart failure, diabetes mellitus, hyperlipidemia and diabetes mellitus.  She is been diabetic for 60 years.  She had amputation of the right lateral toes in 2006. She had a non-ST elevation myocardial infarction in January of this year.  Cardiac catheterization via the right femoral artery showed severe native vessel coronary artery disease with patent LIMA to LAD and patent SVG to right PDA.  Filling pressures were severely elevated with severe pulmonary hypertension.  Medical therapy was recommended.  The patient was referred to cardiac rehab but she could not exercise there very well due to severe bilateral leg claudication.  Her symptoms started in her hips and goes all the way down.  The symptoms started even after walking 50 feet on a flat level.  She could not finish cardiac rehab due to that reason.  She had a repeat echocardiogram in May which showed improvement in EF to 45 to 50%. She underwent vascular studies this month which showed an ABI of 0.92 on the right and 0.93 on the left.  Toe pressure was significantly abnormal bilaterally.  The patient was noted to have biphasic waveforms the common femoral artery.  Duplex showed moderate bilateral common femoral artery and SFA disease.  Past Medical History:  Diagnosis Date  . Anemia   . Anxiety   . Arthritis   . Carotid artery disease (HCC)    a. Last carotid study in 2014 showed 1-39% RICA, 40-59% LICA.  . Chronic combined systolic (congestive) and diastolic (congestive) heart failure (HCC)    a.  EF 40-45% in 2014. b. EF 30-35% in 07/2017.  . CKD (chronic kidney disease), stage III (HCC)   . Coronary artery disease    a. s/p CABG 2014. b. Cath 07/2017 for NSTEMI -> med rx.  . Diabetes mellitus (HCC)   . Diabetic peripheral neuropathy (HCC)   . Eczema   . GERD (gastroesophageal reflux disease)   . H/O hiatal hernia   . Headache(784.0)    allergies, sinus  . Hyperlipidemia   . Hypertension   . Myocardial infarction (HCC)   . PONV (postoperative nausea and vomiting)   . Sleep apnea   . Umbilical hernia     Past Surgical History:  Procedure Laterality Date  . ABDOMINAL HYSTERECTOMY    . 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 CARDIAC CATHETERIZATION    . CARPAL TUNNEL RELEASE    . CESAREAN SECTION  1983  . CORONARY ARTERY BYPASS GRAFT N/A 04/11/2013   Procedure: CORONARY ARTERY BYPASS GRAFTING (CABG);  Surgeon: Bryan K Bartle, MD;  Location: [facility] OR;  Service: Open Heart Surgery;  Laterality: N/A;  . EYE SURGERY Bilateral    laser  . FRACTURE SURGERY Left 2005  . RIGHT/LEFT HEART CATH AND CORONARY/GRAFT ANGIOGRAPHY N/A 08/06/2017   Procedure: RIGHT/LEFT HEART CATH AND CORONARY/GRAFT ANGIOGRAPHY;  Surgeon: End, Christopher, MD;  Location: [facility] INVASIVE CV LAB;  Service: Cardiovascular;  Laterality: N/A;     Current Outpatient Medications  Medication Sig Dispense Refill  . albuterol (PROAIR   HFA) 108 (90 BASE) MCG/ACT inhaler Inhale 2 puffs into the lungs every 4 (four) hours as needed.     . aspirin EC 81 MG tablet Take 81 mg by mouth daily.     . Cholecalciferol (VITAMIN D) 2000 units tablet Take 2,000 Units by mouth daily.    . clonazePAM (KLONOPIN) 1 MG tablet Take 0.5 tablets (0.5 mg total) by mouth at bedtime. (Patient taking differently: Take 1 mg by mouth at bedtime. ) 90 tablet 0  . clopidogrel (PLAVIX) 75 MG tablet Take 1 tablet (75 mg total) by mouth daily. 90 tablet 3  . Coenzyme Q10 (COQ10) 100 MG CAPS Take by mouth.    . ezetimibe (ZETIA) 10 MG tablet Take 10 mg by  mouth daily.    . furosemide (LASIX) 40 MG tablet Take 1 tablet (40 mg total) by mouth daily. Take 40 mg daily. May take and extra 40 mg if you gain 3-5 lbs. 180 tablet 3  . gabapentin (NEURONTIN) 800 MG tablet Take 800 mg by mouth 3 (three) times daily.     . insulin aspart (NOVOLOG) 100 UNIT/ML injection 1.25 Units 12 Am-7 Am,  1.75 u/hr 7 AM  Til 12 AM, Bolus 1 unit for every 15 carbs, 1 Unit for every 50 points greater than 150 Insulin pump    . levocetirizine (XYZAL) 5 MG tablet Take 5 mg by mouth daily. TAKE 1 TABLET EVERY DAY (NEEDS APPOINTMENT AND LABS)    . metoprolol succinate (TOPROL-XL) 25 MG 24 hr tablet Take 1 tablet (25 mg total) by mouth 2 (two) times daily. 180 tablet 3  . PARoxetine (PAXIL) 20 MG tablet Take 20 mg by mouth at bedtime.     . polyethylene glycol (MIRALAX / GLYCOLAX) packet Take 17 g by mouth daily as needed.     . Probiotic Product (PROBIOTIC-10 PO) Take 1 tablet by mouth daily.    . ranitidine (ZANTAC) 300 MG tablet Take 300 mg by mouth daily. TAKE 1 TABLET EVERY DAY    . rosuvastatin (CRESTOR) 40 MG tablet Take 40 mg by mouth daily.    . sacubitril-valsartan (ENTRESTO) 24-26 MG Take 1 tablet by mouth 2 (two) times daily. 180 tablet 3  . spironolactone (ALDACTONE) 25 MG tablet Take 0.5 tablets (12.5 mg total) by mouth daily. 45 tablet 3   No current facility-administered medications for this visit.     Allergies:   Bactrim [sulfamethoxazole-trimethoprim]; Codeine; Mobic [meloxicam]; Morphine and related; Silvadene [silver sulfadiazine]; Adhesive [tape]; and Sulfamethoxazole-trimethoprim    Social History:  The patient  reports that she quit smoking about 7 years ago. Her smoking use included cigarettes. She quit after 25.00 years of use. She has never used smokeless tobacco. She reports that she does not drink alcohol or use drugs.   Family History:  The patient's family history includes Heart attack in her brother and mother; Heart disease in her brother and  mother; Hypertension in her brother and mother.    ROS:  Please see the history of present illness.   Otherwise, review of systems are positive for none.   All other systems are reviewed and negative.    PHYSICAL EXAM: VS:  BP 98/60   Pulse 69   Ht 5' 3" (1.6 m)   Wt 202 lb 9.6 oz (91.9 kg)   SpO2 99%   BMI 35.89 kg/m  , BMI Body mass index is 35.89 kg/m. GEN: Well nourished, well developed, in no acute distress  HEENT: normal  Neck: no   JVD, carotid bruits, or masses Cardiac: RRR; no murmurs, rubs, or gallops,no edema  Respiratory:  clear to auscultation bilaterally, normal work of breathing GI: soft, nontender, nondistended, + BS MS: no deformity or atrophy  Skin: warm and dry, no rash Neuro:  Strength and sensation are intact Psych: euthymic mood, full affect Vascular: Femoral pulses +1 bilaterally.  Distal pulses are not palpable.   EKG:  EKG is not ordered today.    Recent Labs: 08/02/2017: B Natriuretic Peptide 1,347.3 08/08/2017: Magnesium 2.0 08/22/2017: ALT 20; BUN 44; Creatinine, Ser 1.54; Hemoglobin 11.6; Platelets 362; Potassium 5.1; Sodium 137    Lipid Panel    Component Value Date/Time   CHOL 183 08/22/2017 1448   TRIG 143 08/22/2017 1448   HDL 36 (L) 08/22/2017 1448   CHOLHDL 5.1 08/22/2017 1448   VLDL 29 08/22/2017 1448   LDLCALC 118 (H) 08/22/2017 1448      Wt Readings from Last 3 Encounters:  02/19/18 202 lb 9.6 oz (91.9 kg)  12/11/17 202 lb (91.6 kg)  08/31/17 198 lb (89.8 kg)       No flowsheet data found.    ASSESSMENT AND PLAN:  1.  Peripheral arterial disease with severe bilateral hip and leg claudication likely due to inflow disease.  Her symptoms are severe and lifestyle limiting based on her symptoms and physical exam as well as Doppler findings, I suspect that she has significant aortoiliac disease contributing to her symptoms.  She could not participate in cardiac rehab due to that reason.  Due to that, I recommend proceeding with  abdominal aortogram with limited lower extremity angiography and possible endovascular intervention.  I discussed the procedure in details as well as risks and benefits.  She does have chronic kidney disease and thus will bring her 4 hours for hydration and also will try to use CO2.  2.  Coronary artery disease involving native coronary arteries: Symptoms are well controlled at the present time.  3.  Chronic systolic heart failure: She appears to be euvolemic currently on optimal medical therapy.    Disposition:   FU with me in 1 month  Signed,  Moustapha Tooker, MD  02/19/2018 5:41 PM    [facility] 

## 2018-02-19 NOTE — Patient Instructions (Signed)
Medication Instructions: Your physician recommends that you continue on your current medications as directed. Please refer to the Current Medication list given to you today.  If you need a refill on your cardiac medications before your next appointment, please call your pharmacy.   Follow-Up: Your physician wants you to follow-up in one month with Dr. Kirke Corin.   Thank you for choosing [facility] at [facility]!!         Kiara [name] [facility] CARDIOVASCULAR DIVISION [facility] [address] [place] [address] Dept: [phone] Loc: [phone]  Kiara [name] [facility]  02/19/2018  You are scheduled for a Peripheral Angiogram on Wednesday, August 7 with Dr. Lorine Bears.  1. Please arrive at the [facility] (Main Entrance A) at [facility]: [address] [place], [place] [address] at 6:30 AM (This time is four hours before your procedure for hydration). Free valet parking service is available.   Special note: Every effort is made to have your procedure done on time. Please understand that emergencies sometimes delay scheduled procedures.  2. Diet: Do not eat solid foods after midnight.  The patient may have clear liquids until 5am upon the day of the procedure.  3. Labs: You will need to have blood drawn today, 7/23. You do not need to be fasting.  4. Medication instructions in preparation for your procedure: Please hold your Furosemide the morning of the procedure Please hold all diabetic medications the morning of the procedure. Stop your insulin pump accordingly.     On the morning of your procedure, take your Aspirin and Plavix/Clopidogrel and any morning medicines NOT listed above.  You may use sips of water.  5. Plan for one night stay--bring personal belongings. 6. Bring a current list of your medications and current insurance cards. 7. You MUST have a responsible person to drive you home. 8. Someone MUST be with you the  first 24 hours after you arrive home or your discharge will be delayed. 9. Please wear clothes that are easy to get on and off and wear slip-on shoes.  Thank you for allowing [place] to care for you!   -- [facility]

## 2018-02-19 NOTE — H&P (View-Only) (Signed)
Cardiology Office Note   Date:  02/19/2018   ID:  Kiara [name], DOB [birth year], MRN [id number]  PCP:  Majel Homer, MD  Cardiologist:  Dr. Purvis Sheffield  No chief complaint on file.     History of Present Illness: Kiara [name] is a 67 y.o. female who was referred by Dr. Purvis Sheffield for evaluation and management of peripheral arterial disease. She has known history of coronary artery disease status post CABG, chronic systolic heart failure, diabetes mellitus, hyperlipidemia and diabetes mellitus.  She is been diabetic for 60 years.  She had amputation of the right lateral toes in 2006. She had a non-ST elevation myocardial infarction in January of this year.  Cardiac catheterization via the right femoral artery showed severe native vessel coronary artery disease with patent LIMA to LAD and patent SVG to right PDA.  Filling pressures were severely elevated with severe pulmonary hypertension.  Medical therapy was recommended.  The patient was referred to cardiac rehab but she could not exercise there very well due to severe bilateral leg claudication.  Her symptoms started in her hips and goes all the way down.  The symptoms started even after walking 50 feet on a flat level.  She could not finish cardiac rehab due to that reason.  She had a repeat echocardiogram in May which showed improvement in EF to 45 to 50%. She underwent vascular studies this month which showed an ABI of 0.92 on the right and 0.93 on the left.  Toe pressure was significantly abnormal bilaterally.  The patient was noted to have biphasic waveforms the common femoral artery.  Duplex showed moderate bilateral common femoral artery and SFA disease.  Past Medical History:  Diagnosis Date  . Anemia   . Anxiety   . Arthritis   . Carotid artery disease (HCC)    a. Last carotid study in 2014 showed 1-39% RICA, 40-59% LICA.  Marland Kitchen Chronic combined systolic (congestive) and diastolic (congestive) heart failure (HCC)    a.  EF 40-45% in 2014. b. EF 30-35% in 07/2017.  . CKD (chronic kidney disease), stage III (HCC)   . Coronary artery disease    a. s/p CABG 2014. b. Cath 07/2017 for NSTEMI -> med rx.  . Diabetes mellitus (HCC)   . Diabetic peripheral neuropathy (HCC)   . Eczema   . GERD (gastroesophageal reflux disease)   . H/O hiatal hernia   . Headache(784.0)    allergies, sinus  . Hyperlipidemia   . Hypertension   . Myocardial infarction (HCC)   . PONV (postoperative nausea and vomiting)   . Sleep apnea   . Umbilical hernia     Past Surgical History:  Procedure Laterality Date  . ABDOMINAL HYSTERECTOMY    . 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 CARDIAC CATHETERIZATION    . CARPAL TUNNEL RELEASE    . CESAREAN SECTION  1983  . CORONARY ARTERY BYPASS GRAFT N/A 04/11/2013   Procedure: CORONARY ARTERY BYPASS GRAFTING (CABG);  Surgeon: Alleen Borne, MD;  Location: [facility];  Service: Open Heart Surgery;  Laterality: N/A;  . EYE SURGERY Bilateral    laser  . FRACTURE SURGERY Left 2005  . RIGHT/LEFT HEART CATH AND CORONARY/GRAFT ANGIOGRAPHY N/A 08/06/2017   Procedure: RIGHT/LEFT HEART CATH AND CORONARY/GRAFT ANGIOGRAPHY;  Surgeon: Yvonne Kendall, MD;  Location: [facility];  Service: Cardiovascular;  Laterality: N/A;     Current Outpatient Medications  Medication Sig Dispense Refill  . albuterol (PROAIR  HFA) 108 (90 BASE) MCG/ACT inhaler Inhale 2 puffs into the lungs every 4 (four) hours as needed.     Marland Kitchen aspirin EC 81 MG tablet Take 81 mg by mouth daily.     . Cholecalciferol (VITAMIN D) 2000 units tablet Take 2,000 Units by mouth daily.    . clonazePAM (KLONOPIN) 1 MG tablet Take 0.5 tablets (0.5 mg total) by mouth at bedtime. (Patient taking differently: Take 1 mg by mouth at bedtime. ) 90 tablet 0  . clopidogrel (PLAVIX) 75 MG tablet Take 1 tablet (75 mg total) by mouth daily. 90 tablet 3  . Coenzyme Q10 (COQ10) 100 MG CAPS Take by mouth.    . ezetimibe (ZETIA) 10 MG tablet Take 10 mg by  mouth daily.    . furosemide (LASIX) 40 MG tablet Take 1 tablet (40 mg total) by mouth daily. Take 40 mg daily. May take and extra 40 mg if you gain 3-5 lbs. 180 tablet 3  . gabapentin (NEURONTIN) 800 MG tablet Take 800 mg by mouth 3 (three) times daily.     . insulin aspart (NOVOLOG) 100 UNIT/ML injection 1.25 Units 12 Am-7 Am,  1.75 u/hr 7 AM  Til 12 AM, Bolus 1 unit for every 15 carbs, 1 Unit for every 50 points greater than 150 Insulin pump    . levocetirizine (XYZAL) 5 MG tablet Take 5 mg by mouth daily. TAKE 1 TABLET EVERY DAY (NEEDS APPOINTMENT AND LABS)    . metoprolol succinate (TOPROL-XL) 25 MG 24 hr tablet Take 1 tablet (25 mg total) by mouth 2 (two) times daily. 180 tablet 3  . PARoxetine (PAXIL) 20 MG tablet Take 20 mg by mouth at bedtime.     . polyethylene glycol (MIRALAX / GLYCOLAX) packet Take 17 g by mouth daily as needed.     . Probiotic Product (PROBIOTIC-10 PO) Take 1 tablet by mouth daily.    . ranitidine (ZANTAC) 300 MG tablet Take 300 mg by mouth daily. TAKE 1 TABLET EVERY DAY    . rosuvastatin (CRESTOR) 40 MG tablet Take 40 mg by mouth daily.    . sacubitril-valsartan (ENTRESTO) 24-26 MG Take 1 tablet by mouth 2 (two) times daily. 180 tablet 3  . spironolactone (ALDACTONE) 25 MG tablet Take 0.5 tablets (12.5 mg total) by mouth daily. 45 tablet 3   No current facility-administered medications for this visit.     Allergies:   Bactrim [sulfamethoxazole-trimethoprim]; Codeine; Mobic [meloxicam]; Morphine and related; Silvadene [silver sulfadiazine]; Adhesive [tape]; and Sulfamethoxazole-trimethoprim    Social History:  The patient  reports that she quit smoking about 7 years ago. Her smoking use included cigarettes. She quit after 25.00 years of use. She has never used smokeless tobacco. She reports that she does not drink alcohol or use drugs.   Family History:  The patient's family history includes Heart attack in her brother and mother; Heart disease in her brother and  mother; Hypertension in her brother and mother.    ROS:  Please see the history of present illness.   Otherwise, review of systems are positive for none.   All other systems are reviewed and negative.    PHYSICAL EXAM: VS:  BP 98/60   Pulse 69   Ht 5\' 3"  (1.6 m)   Wt 202 lb 9.6 oz (91.9 kg)   SpO2 99%   BMI 35.89 kg/m  , BMI Body mass index is 35.89 kg/m. GEN: Well nourished, well developed, in no acute distress  HEENT: normal  Neck: no  JVD, carotid bruits, or masses Cardiac: RRR; no murmurs, rubs, or gallops,no edema  Respiratory:  clear to auscultation bilaterally, normal work of breathing GI: soft, nontender, nondistended, + BS MS: no deformity or atrophy  Skin: warm and dry, no rash Neuro:  Strength and sensation are intact Psych: euthymic mood, full affect Vascular: Femoral pulses +1 bilaterally.  Distal pulses are not palpable.   EKG:  EKG is not ordered today.    Recent Labs: 08/02/2017: B Natriuretic Peptide 1,347.3 08/08/2017: Magnesium 2.0 08/22/2017: ALT 20; BUN 44; Creatinine, Ser 1.54; Hemoglobin 11.6; Platelets 362; Potassium 5.1; Sodium 137    Lipid Panel    Component Value Date/Time   CHOL 183 08/22/2017 1448   TRIG 143 08/22/2017 1448   HDL 36 (L) 08/22/2017 1448   CHOLHDL 5.1 08/22/2017 1448   VLDL 29 08/22/2017 1448   LDLCALC 118 (H) 08/22/2017 1448      Wt Readings from Last 3 Encounters:  02/19/18 202 lb 9.6 oz (91.9 kg)  12/11/17 202 lb (91.6 kg)  08/31/17 198 lb (89.8 kg)       No flowsheet data found.    ASSESSMENT AND PLAN:  1.  Peripheral arterial disease with severe bilateral hip and leg claudication likely due to inflow disease.  Her symptoms are severe and lifestyle limiting based on her symptoms and physical exam as well as Doppler findings, I suspect that she has significant aortoiliac disease contributing to her symptoms.  She could not participate in cardiac rehab due to that reason.  Due to that, I recommend proceeding with  abdominal aortogram with limited lower extremity angiography and possible endovascular intervention.  I discussed the procedure in details as well as risks and benefits.  She does have chronic kidney disease and thus will bring her 4 hours for hydration and also will try to use CO2.  2.  Coronary artery disease involving native coronary arteries: Symptoms are well controlled at the present time.  3.  Chronic systolic heart failure: She appears to be euvolemic currently on optimal medical therapy.    Disposition:   FU with me in 1 month  Signed,  Lorine Bears, MD  02/19/2018 5:41 PM    [facility]

## 2018-02-20 ENCOUNTER — Telehealth: Payer: Self-pay

## 2018-02-20 LAB — CBC
HEMATOCRIT: 33 % — AB (ref 34.0–46.6)
HEMOGLOBIN: 10.3 g/dL — AB (ref 11.1–15.9)
MCH: 30.4 pg (ref 26.6–33.0)
MCHC: 31.2 g/dL — ABNORMAL LOW (ref 31.5–35.7)
MCV: 97 fL (ref 79–97)
Platelets: 229 10*3/uL (ref 150–450)
RBC: 3.39 x10E6/uL — AB (ref 3.77–5.28)
RDW: 13.5 % (ref 12.3–15.4)
WBC: 7 10*3/uL (ref 3.4–10.8)

## 2018-02-20 LAB — BASIC METABOLIC PANEL
BUN/Creatinine Ratio: 27 (ref 12–28)
BUN: 40 mg/dL — AB (ref 8–27)
CALCIUM: 9.2 mg/dL (ref 8.7–10.3)
CO2: 26 mmol/L (ref 20–29)
CREATININE: 1.5 mg/dL — AB (ref 0.57–1.00)
Chloride: 101 mmol/L (ref 96–106)
GFR calc Af Amer: 42 mL/min/{1.73_m2} — ABNORMAL LOW (ref >59)
GFR, EST NON AFRICAN AMERICAN: 37 mL/min/{1.73_m2} — AB (ref >59)
Glucose: 402 mg/dL — ABNORMAL HIGH (ref 65–99)
Potassium: 6 mmol/L (ref 3.5–5.2)
Sodium: 140 mmol/L (ref 134–144)

## 2018-02-20 NOTE — Telephone Encounter (Signed)
Left another message for pt.

## 2018-02-20 NOTE — Telephone Encounter (Signed)
Left another message for pt to call re: her K. Also sent to Dr. Kirke Corin.

## 2018-02-20 NOTE — Telephone Encounter (Signed)
Left message for pt to call re: K that was called in to [place] from [facility].. 6.0. Spoke with Dr. Herbie Baltimore DOD and pt to hold Spironolactone and to take an extra Lasix 40mg  today and tomorrow and to repeat Bmet on Friday 02/22/18.

## 2018-02-21 NOTE — Telephone Encounter (Signed)
Pt is going to PMD office in am for repeat BMET and will fax results to this office ./cy

## 2018-02-21 NOTE — Telephone Encounter (Signed)
Lm to call back ./cy 

## 2018-02-21 NOTE — Telephone Encounter (Signed)
Pt aware of lab results and would like to have labs done at PMD office Called PMD office to see if will draw blood a fax results as pt lives in [place]cy

## 2018-02-21 NOTE — Telephone Encounter (Signed)
Follow Up:     Returning Ann's call from yesterday.

## 2018-02-22 NOTE — Telephone Encounter (Signed)
Routed to primary nurse as Lorain Childes for results

## 2018-02-25 NOTE — Telephone Encounter (Signed)
Call placed to the patient to check up on the repeat BMET. She stated that she had the labs done on Friday and was informed that the potassium was now 4.6.  Her PCP will be faxing the labs to the office.

## 2018-03-06 ENCOUNTER — Encounter (HOSPITAL_COMMUNITY)
Admission: RE | Disposition: A | Discharge status: Home / Self Care | Payer: Self-pay | Source: Ambulatory Visit | Attending: Cardiovascular Disease

## 2018-03-06 ENCOUNTER — Ambulatory Visit (HOSPITAL_COMMUNITY)
Admission: RE | Admit: 2018-03-06 | Discharge: 2018-03-06 | Disposition: A | Discharge status: Home / Self Care | Payer: Medicare PPO | Source: Ambulatory Visit | Attending: Cardiovascular Disease | Admitting: Cardiovascular Disease

## 2018-03-06 ENCOUNTER — Other Ambulatory Visit: Payer: Self-pay

## 2018-03-06 DIAGNOSIS — Z87891 Personal history of nicotine dependence: Secondary | ICD-10-CM | POA: Insufficient documentation

## 2018-03-06 DIAGNOSIS — K219 Gastro-esophageal reflux disease without esophagitis: Secondary | ICD-10-CM | POA: Diagnosis not present

## 2018-03-06 DIAGNOSIS — Z7901 Long term (current) use of anticoagulants: Secondary | ICD-10-CM | POA: Insufficient documentation

## 2018-03-06 DIAGNOSIS — Z888 Allergy status to other drugs, medicaments and biological substances status: Secondary | ICD-10-CM | POA: Diagnosis not present

## 2018-03-06 DIAGNOSIS — Z90710 Acquired absence of both cervix and uterus: Secondary | ICD-10-CM | POA: Diagnosis not present

## 2018-03-06 DIAGNOSIS — Z7982 Long term (current) use of aspirin: Secondary | ICD-10-CM | POA: Diagnosis not present

## 2018-03-06 DIAGNOSIS — Z882 Allergy status to sulfonamides status: Secondary | ICD-10-CM | POA: Diagnosis not present

## 2018-03-06 DIAGNOSIS — I70213 Atherosclerosis of native arteries of extremities with intermittent claudication, bilateral legs: Secondary | ICD-10-CM

## 2018-03-06 DIAGNOSIS — I130 Hypertensive heart and chronic kidney disease with heart failure and stage 1 through stage 4 chronic kidney disease, or unspecified chronic kidney disease: Secondary | ICD-10-CM | POA: Diagnosis not present

## 2018-03-06 DIAGNOSIS — I6523 Occlusion and stenosis of bilateral carotid arteries: Secondary | ICD-10-CM | POA: Diagnosis not present

## 2018-03-06 DIAGNOSIS — Z8249 Family history of ischemic heart disease and other diseases of the circulatory system: Secondary | ICD-10-CM | POA: Insufficient documentation

## 2018-03-06 DIAGNOSIS — E1122 Type 2 diabetes mellitus with diabetic chronic kidney disease: Secondary | ICD-10-CM | POA: Diagnosis not present

## 2018-03-06 DIAGNOSIS — E1151 Type 2 diabetes mellitus with diabetic peripheral angiopathy without gangrene: Secondary | ICD-10-CM | POA: Insufficient documentation

## 2018-03-06 DIAGNOSIS — Z885 Allergy status to narcotic agent status: Secondary | ICD-10-CM | POA: Insufficient documentation

## 2018-03-06 DIAGNOSIS — Z79899 Other long term (current) drug therapy: Secondary | ICD-10-CM | POA: Diagnosis not present

## 2018-03-06 DIAGNOSIS — I5042 Chronic combined systolic (congestive) and diastolic (congestive) heart failure: Secondary | ICD-10-CM | POA: Diagnosis not present

## 2018-03-06 DIAGNOSIS — Z9889 Other specified postprocedural states: Secondary | ICD-10-CM | POA: Insufficient documentation

## 2018-03-06 DIAGNOSIS — Z951 Presence of aortocoronary bypass graft: Secondary | ICD-10-CM | POA: Insufficient documentation

## 2018-03-06 DIAGNOSIS — N183 Chronic kidney disease, stage 3 (moderate): Secondary | ICD-10-CM | POA: Diagnosis not present

## 2018-03-06 DIAGNOSIS — G4730 Sleep apnea, unspecified: Secondary | ICD-10-CM | POA: Insufficient documentation

## 2018-03-06 DIAGNOSIS — E1142 Type 2 diabetes mellitus with diabetic polyneuropathy: Secondary | ICD-10-CM | POA: Diagnosis not present

## 2018-03-06 DIAGNOSIS — Z89421 Acquired absence of other right toe(s): Secondary | ICD-10-CM | POA: Diagnosis not present

## 2018-03-06 DIAGNOSIS — M1990 Unspecified osteoarthritis, unspecified site: Secondary | ICD-10-CM | POA: Diagnosis not present

## 2018-03-06 DIAGNOSIS — E785 Hyperlipidemia, unspecified: Secondary | ICD-10-CM | POA: Insufficient documentation

## 2018-03-06 DIAGNOSIS — I252 Old myocardial infarction: Secondary | ICD-10-CM | POA: Diagnosis not present

## 2018-03-06 DIAGNOSIS — Z794 Long term (current) use of insulin: Secondary | ICD-10-CM | POA: Diagnosis not present

## 2018-03-06 DIAGNOSIS — I739 Peripheral vascular disease, unspecified: Secondary | ICD-10-CM

## 2018-03-06 HISTORY — PX: ABDOMINAL AORTOGRAM W/LOWER EXTREMITY: CATH118223

## 2018-03-06 LAB — GLUCOSE, CAPILLARY
Glucose-Capillary: 110 mg/dL — ABNORMAL HIGH (ref 70–99)
Glucose-Capillary: 91 mg/dL (ref 70–99)

## 2018-03-06 SURGERY — ABDOMINAL AORTOGRAM W/LOWER EXTREMITY
Anesthesia: LOCAL

## 2018-03-06 MED ORDER — MIDAZOLAM HCL 2 MG/2ML IJ SOLN
INTRAMUSCULAR | Status: AC
  Filled 2018-03-06: qty 2

## 2018-03-06 MED ORDER — SODIUM CHLORIDE 0.9 % IV SOLN: 250.0000 mL | INTRAVENOUS | Status: DC | PRN

## 2018-03-06 MED ORDER — HEPARIN (PORCINE) IN NACL 1000-0.9 UT/500ML-% IV SOLN
INTRAVENOUS | Status: DC | PRN
  Administered 2018-03-06 (×2): 500 mL

## 2018-03-06 MED ORDER — SODIUM CHLORIDE 0.9% FLUSH: 3.0000 mL | INTRAVENOUS | Status: DC | PRN

## 2018-03-06 MED ORDER — SODIUM CHLORIDE 0.9% FLUSH: 3.0000 mL | Freq: Two times a day (BID) | INTRAVENOUS | Status: DC

## 2018-03-06 MED ORDER — SODIUM CHLORIDE 0.9 % IV SOLN
INTRAVENOUS | Status: AC
  Administered 2018-03-06: 11:00:00 via INTRAVENOUS

## 2018-03-06 MED ORDER — MIDAZOLAM HCL 2 MG/2ML IJ SOLN
INTRAMUSCULAR | Status: DC | PRN
  Administered 2018-03-06: 1 mg via INTRAVENOUS

## 2018-03-06 MED ORDER — ONDANSETRON HCL 4 MG/2ML IJ SOLN
4.0000 mg | Freq: Four times a day (QID) | INTRAMUSCULAR | Status: DC | PRN
Start: 2018-03-06 — End: 2018-03-06

## 2018-03-06 MED ORDER — ACETAMINOPHEN 325 MG PO TABS: 650.0000 mg | ORAL_TABLET | ORAL | Status: DC | PRN

## 2018-03-06 MED ORDER — IODIXANOL 320 MG/ML IV SOLN
INTRAVENOUS | Status: DC | PRN
  Administered 2018-03-06: 20 mL via INTRA_ARTERIAL

## 2018-03-06 MED ORDER — LIDOCAINE HCL (PF) 1 % IJ SOLN
INTRAMUSCULAR | Status: DC | PRN
  Administered 2018-03-06: 15 mL via INTRADERMAL

## 2018-03-06 MED ORDER — ASPIRIN 81 MG PO CHEW: 81.0000 mg | CHEWABLE_TABLET | ORAL | Status: DC

## 2018-03-06 MED ORDER — SODIUM CHLORIDE 0.9 % IV SOLN
INTRAVENOUS | Status: DC
  Administered 2018-03-06: 07:00:00 via INTRAVENOUS

## 2018-03-06 MED ORDER — LIDOCAINE HCL (PF) 1 % IJ SOLN
INTRAMUSCULAR | Status: AC
  Filled 2018-03-06: qty 30

## 2018-03-06 MED ORDER — HEPARIN (PORCINE) IN NACL 1000-0.9 UT/500ML-% IV SOLN
INTRAVENOUS | Status: AC
  Filled 2018-03-06: qty 1000

## 2018-03-06 SURGICAL SUPPLY — 13 items
CATH OMNI FLUSH 5F 65CM (CATHETERS) ×2 IMPLANT
FILTER CO2 0.2 MICRON (VASCULAR PRODUCTS) ×2 IMPLANT
KIT MICROPUNCTURE NIT STIFF (SHEATH) ×2 IMPLANT
KIT PV (KITS) ×2 IMPLANT
RESERVOIR CO2 (VASCULAR PRODUCTS) ×2 IMPLANT
SET FLUSH CO2 (MISCELLANEOUS) ×2 IMPLANT
SHEATH PINNACLE 5F 10CM (SHEATH) ×2 IMPLANT
SHEATH PROBE COVER 6X72 (BAG) ×2 IMPLANT
STOPCOCK MORSE 400PSI 3WAY (MISCELLANEOUS) ×2 IMPLANT
SYRINGE MEDRAD AVANTA MACH 7 (SYRINGE) ×2 IMPLANT
TRANSDUCER W/STOPCOCK (MISCELLANEOUS) ×2 IMPLANT
TRAY PV CATH (CUSTOM PROCEDURE TRAY) ×2 IMPLANT
WIRE HITORQ VERSACORE ST 145CM (WIRE) ×2 IMPLANT

## 2018-03-06 NOTE — Interval H&P Note (Signed)
History and Physical Interval Note:  03/06/2018 10:00 AM  Kiara [name]  has presented today for surgery, with the diagnosis of claudication  The various methods of treatment have been discussed with the patient and family. After consideration of risks, benefits and other options for treatment, the patient has consented to  Procedure(s): ABDOMINAL AORTOGRAM W/LOWER EXTREMITY (N/A) as a surgical intervention .  The patient's history has been reviewed, patient examined, no change in status, stable for surgery.  I have reviewed the patient's chart and labs.  Questions were answered to the patient's satisfaction.     Lorine Bears

## 2018-03-06 NOTE — Progress Notes (Addendum)
Site area: RFA Site Prior to Removal:  Level 0 Pressure Applied For: Manual:   yes  minPatient Status During Pull:  stable Post Pull Site:  Level 0 Post Pull Instructions Given:  yes Post Pull Pulses Present: doppler Dressing Applied:  clear Bedrest begins @ 1140 till 1540 Comments:

## 2018-03-06 NOTE — Discharge Instructions (Signed)

## 2018-03-07 ENCOUNTER — Encounter (HOSPITAL_COMMUNITY): Payer: Self-pay | Admitting: Cardiovascular Disease

## 2018-04-02 ENCOUNTER — Other Ambulatory Visit: Payer: Self-pay | Admitting: Cardiovascular Disease

## 2018-04-02 ENCOUNTER — Ambulatory Visit: Payer: Medicare PPO | Admitting: Cardiovascular Disease

## 2018-04-02 ENCOUNTER — Encounter: Payer: Self-pay | Admitting: Cardiovascular Disease

## 2018-04-02 VITALS — BP 104/50 | HR 69 | Ht 63.0 in | Wt 201.0 lb

## 2018-04-02 DIAGNOSIS — I739 Peripheral vascular disease, unspecified: Secondary | ICD-10-CM

## 2018-04-02 DIAGNOSIS — I2510 Atherosclerotic heart disease of native coronary artery without angina pectoris: Secondary | ICD-10-CM

## 2018-04-02 DIAGNOSIS — I5022 Chronic systolic (congestive) heart failure: Secondary | ICD-10-CM | POA: Diagnosis not present

## 2018-04-02 NOTE — Progress Notes (Signed)
Cardiology Office Note   Date:  04/02/2018   ID:  AEVA ALSMAN, DOB July 27, 1951, MRN [id number]  PCP:  Majel Homer, MD  Cardiologist:  Dr. Purvis Sheffield  No chief complaint on file.     History of Present Illness: Kiara [name] is a 67 y.o. female who is here today for follow-up visit regarding peripheral arterial disease with recent angiogram. She has known history of coronary artery disease status post CABG, chronic systolic heart failure, diabetes mellitus, hyperlipidemia and diabetes mellitus.  She is been diabetic for 60 years.  She had amputation of the right lateral toes in 2006. She had a non-ST elevation myocardial infarction in January of this year.  Cardiac catheterization via the right femoral artery showed severe native vessel coronary artery disease with patent LIMA to LAD and patent SVG to right PDA.  Filling pressures were severely elevated with severe pulmonary hypertension.  Medical therapy was recommended.  The patient was referred to cardiac rehab but she could not exercise there very well due to severe bilateral leg claudication.  Her symptoms started in her hips and goes all the way down.  The symptoms started even after walking 50 feet on a flat level.  She could not finish cardiac rehab due to that reason.  She had a repeat echocardiogram in May which showed improvement in EF to 45 to 50%. She underwent vascular studies which showed an ABI of 0.92 on the right and 0.93 on the left.  Toe pressure was significantly abnormal bilaterally.  The patient was noted to have biphasic waveforms the common femoral artery.  Duplex showed moderate bilateral common femoral artery and SFA disease.  I proceeded with CO2 angiography last month which showed mild disease affecting the right common femoral and SFA disease with three-vessel runoff below the knee.  In the left, there was moderate calcified common iliac artery stenosis and moderate distal SFA disease.  Past Medical  History:  Diagnosis Date  . Anemia   . Anxiety   . Arthritis   . Carotid artery disease (HCC)    a. Last carotid study in 2014 showed 1-39% RICA, 40-59% LICA.  Marland Kitchen Chronic combined systolic (congestive) and diastolic (congestive) heart failure (HCC)    a. EF 40-45% in 2014. b. EF 30-35% in 07/2017.  . CKD (chronic kidney disease), stage III (HCC)   . Coronary artery disease    a. s/p CABG 2014. b. Cath 07/2017 for NSTEMI -> med rx.  . Diabetes mellitus (HCC)   . Diabetic peripheral neuropathy (HCC)   . Eczema   . GERD (gastroesophageal reflux disease)   . H/O hiatal hernia   . Headache(784.0)    allergies, sinus  . Hyperlipidemia   . Hypertension   . Myocardial infarction (HCC)   . PONV (postoperative nausea and vomiting)   . Sleep apnea   . Umbilical hernia     Past Surgical History:  Procedure Laterality Date  . ABDOMINAL AORTOGRAM W/LOWER EXTREMITY N/A 03/06/2018   Procedure: ABDOMINAL AORTOGRAM W/LOWER EXTREMITY;  Surgeon: Iran Ouch, MD;  Location: [facility];  Service: Cardiovascular;  Laterality: N/A;  . ABDOMINAL HYSTERECTOMY    . 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 CARDIAC CATHETERIZATION    . CARPAL TUNNEL RELEASE    . CESAREAN SECTION  1983  . CORONARY ARTERY BYPASS GRAFT N/A 04/11/2013   Procedure: CORONARY ARTERY BYPASS GRAFTING (CABG);  Surgeon: Alleen Borne, MD;  Location: [facility];  Service: Open Heart Surgery;  Laterality: N/A;  . EYE SURGERY Bilateral    laser  . FRACTURE SURGERY Left 2005  . RIGHT/LEFT HEART CATH AND CORONARY/GRAFT ANGIOGRAPHY N/A 08/06/2017   Procedure: RIGHT/LEFT HEART CATH AND CORONARY/GRAFT ANGIOGRAPHY;  Surgeon: Yvonne Kendall, MD;  Location: [facility];  Service: Cardiovascular;  Laterality: N/A;     Current Outpatient Medications  Medication Sig Dispense Refill  . albuterol (PROAIR HFA) 108 (90 BASE) MCG/ACT inhaler Inhale 2 puffs into the lungs every 4 (four) hours as needed.     Marland Kitchen aspirin EC 81 MG tablet  Take 81 mg by mouth daily.     . Cholecalciferol (VITAMIN D) 2000 units tablet Take 2,000 Units by mouth daily.    . clonazePAM (KLONOPIN) 1 MG tablet Take 0.5 tablets (0.5 mg total) by mouth at bedtime. 90 tablet 0  . clopidogrel (PLAVIX) 75 MG tablet Take 1 tablet (75 mg total) by mouth daily. 90 tablet 3  . Coenzyme Q10 (COQ10) 100 MG CAPS Take 100 mg by mouth daily.     Marland Kitchen ezetimibe (ZETIA) 10 MG tablet Take 10 mg by mouth daily.    . fluticasone (FLONASE) 50 MCG/ACT nasal spray Place 2 sprays into both nostrils daily as needed for allergies or rhinitis.    . furosemide (LASIX) 40 MG tablet Take 1 tablet (40 mg total) by mouth daily. Take 40 mg daily. May take and extra 40 mg if you gain 3-5 lbs. 180 tablet 3  . gabapentin (NEURONTIN) 800 MG tablet Take 800 mg by mouth 3 (three) times daily.     . insulin aspart (NOVOLOG) 100 UNIT/ML injection Inject into the skin See admin instructions. 1.25 Units 12 Am-7 Am,  1.75 u/hr 7 AM  Til 12 AM, Bolus 1 unit for every 15 carbs, 1 Unit for every 50 points greater than 150 Insulin pump    . levocetirizine (XYZAL) 5 MG tablet Take 5 mg by mouth daily.     . metoprolol succinate (TOPROL-XL) 25 MG 24 hr tablet Take 1 tablet (25 mg total) by mouth 2 (two) times daily. 180 tablet 3  . PARoxetine (PAXIL) 20 MG tablet Take 20 mg by mouth at bedtime.     . polyethylene glycol (MIRALAX / GLYCOLAX) packet Take 17 g by mouth daily as needed for moderate constipation.     . Probiotic Product (PROBIOTIC-10 PO) Take 1 tablet by mouth daily.    . ranitidine (ZANTAC) 300 MG tablet Take 300 mg by mouth daily.     . rosuvastatin (CRESTOR) 40 MG tablet Take 40 mg by mouth daily.    . sacubitril-valsartan (ENTRESTO) 24-26 MG Take 1 tablet by mouth 2 (two) times daily. 180 tablet 3   No current facility-administered medications for this visit.     Allergies:   Bactrim [sulfamethoxazole-trimethoprim]; Codeine; Mobic [meloxicam]; Morphine and related; Silvadene [silver  sulfadiazine]; and Adhesive [tape]    Social History:  The patient  reports that she quit smoking about 7 years ago. Her smoking use included cigarettes. She quit after 25.00 years of use. She has never used smokeless tobacco. She reports that she does not drink alcohol or use drugs.   Family History:  The patient's family history includes Heart attack in her brother and mother; Heart disease in her brother and mother; Hypertension in her brother and mother.    ROS:  Please see the history of present illness.   Otherwise, review of systems are positive for none.   All  other systems are reviewed and negative.    PHYSICAL EXAM: VS:  BP (!) 104/50 (BP Location: Left Arm, Patient Position: Sitting, Cuff Size: Normal)   Pulse 69   Ht 5\' 3"  (1.6 m)   Wt 201 lb (91.2 kg)   BMI 35.61 kg/m  , BMI Body mass index is 35.61 kg/m. GEN: Well nourished, well developed, in no acute distress  HEENT: normal  Neck: no JVD, carotid bruits, or masses Cardiac: RRR; no murmurs, rubs, or gallops,no edema  Respiratory:  clear to auscultation bilaterally, normal work of breathing GI: soft, nontender, nondistended, + BS MS: no deformity or atrophy  Skin: warm and dry, no rash Neuro:  Strength and sensation are intact Psych: euthymic mood, full affect Vascular: Femoral pulses +1 bilaterally.  Distal pulses are not palpable.  No groin hematoma.   EKG:  EKG is not ordered today.    Recent Labs: 08/02/2017: B Natriuretic Peptide 1,347.3 08/08/2017: Magnesium 2.0 08/22/2017: ALT 20 02/19/2018: BUN 40; Creatinine, Ser 1.50; Hemoglobin 10.3; Platelets 229; Potassium 6.0; Sodium 140    Lipid Panel    Component Value Date/Time   CHOL 183 08/22/2017 1448   TRIG 143 08/22/2017 1448   HDL 36 (L) 08/22/2017 1448   CHOLHDL 5.1 08/22/2017 1448   VLDL 29 08/22/2017 1448   LDLCALC 118 (H) 08/22/2017 1448      Wt Readings from Last 3 Encounters:  04/02/18 201 lb (91.2 kg)  03/06/18 200 lb (90.7 kg)  02/19/18  202 lb 9.6 oz (91.9 kg)       No flowsheet data found.    ASSESSMENT AND PLAN:  1.  Peripheral arterial disease with  bilateral hip and leg claudication: Recent angiography showed overall moderate nonobstructive disease as outlined above.  I suspect that her symptoms are multifactorial due to PAD, arthritis and chronic systolic heart failure.  I recommend continuing medical therapy for peripheral arterial disease.  2.  Coronary artery disease involving native coronary arteries: Symptoms are well controlled at the present time.  3.  Chronic systolic heart failure: She appears to be euvolemic currently on optimal medical therapy.    Disposition:   FU with me in 6 months  Signed,  Lorine Bears, MD  04/02/2018 10:39 AM    [facility]

## 2018-04-02 NOTE — Patient Instructions (Signed)
Medication Instructions:  Your physician recommends that you continue on your current medications as directed. Please refer to the Current Medication list given to you today.   Labwork: None ordered  Testing/Procedures: None ordered  Follow-Up: Your physician wants you to follow-up in: 6 months with Dr.Arida You will receive a reminder letter in the mail two months in advance. If you don't receive a letter, please call our office to schedule the follow-up appointment.   Any Other Special Instructions Will Be Listed Below (If Applicable).     If you need a refill on your cardiac medications before your next appointment, please call your pharmacy.   

## 2018-04-05 ENCOUNTER — Other Ambulatory Visit: Payer: Self-pay | Admitting: Physician Assistant

## 2018-04-05 ENCOUNTER — Other Ambulatory Visit: Payer: Self-pay | Admitting: Cardiovascular Disease

## 2018-07-08 ENCOUNTER — Other Ambulatory Visit: Payer: Self-pay | Admitting: Physician Assistant

## 2018-08-15 ENCOUNTER — Telehealth: Payer: Self-pay | Admitting: Cardiovascular Disease

## 2018-08-15 NOTE — Telephone Encounter (Signed)
Called patient to remind her of her appt.  LM to call back and confirm.

## 2018-08-16 ENCOUNTER — Encounter: Payer: Self-pay | Admitting: Cardiovascular Disease

## 2018-08-16 ENCOUNTER — Ambulatory Visit: Payer: Medicare PPO | Admitting: Cardiovascular Disease

## 2018-08-16 VITALS — BP 118/56 | HR 60 | Ht 63.0 in | Wt 208.0 lb

## 2018-08-16 DIAGNOSIS — I739 Peripheral vascular disease, unspecified: Secondary | ICD-10-CM

## 2018-08-16 DIAGNOSIS — E785 Hyperlipidemia, unspecified: Secondary | ICD-10-CM

## 2018-08-16 DIAGNOSIS — I6523 Occlusion and stenosis of bilateral carotid arteries: Secondary | ICD-10-CM

## 2018-08-16 DIAGNOSIS — I10 Essential (primary) hypertension: Secondary | ICD-10-CM

## 2018-08-16 DIAGNOSIS — I25708 Atherosclerosis of coronary artery bypass graft(s), unspecified, with other forms of angina pectoris: Secondary | ICD-10-CM

## 2018-08-16 DIAGNOSIS — I5042 Chronic combined systolic (congestive) and diastolic (congestive) heart failure: Secondary | ICD-10-CM

## 2018-08-16 DIAGNOSIS — N183 Chronic kidney disease, stage 3 unspecified: Secondary | ICD-10-CM

## 2018-08-16 DIAGNOSIS — D649 Anemia, unspecified: Secondary | ICD-10-CM

## 2018-08-16 MED ORDER — SACUBITRIL-VALSARTAN 24-26 MG PO TABS: 1.0000 | ORAL_TABLET | Freq: Two times a day (BID) | ORAL | 0 refills | Status: DC

## 2018-08-16 NOTE — Progress Notes (Signed)
SUBJECTIVE: The patient presents for routine follow-up.  She has coronary artery disease, chronic systolic heart failure, and peripheral arterial disease.  ECG performed in the office today which I ordered and personally interpreted demonstrates normal sinus rhythm with no ischemic ST segment or T-wave abnormalities, nor any arrhythmias.  The patient denies any symptoms of chest pain, palpitations, shortness of breath, lightheadedness, dizziness, leg swelling, orthopnea, PND, and syncope.  She looks after her 66-year-old grandchild and also takes care of her 66 year old mother.  This66 has led to a lot of stress.   Social history: Her daughter, Jeanice LimHolly, is a former employee with [employer].  She now works for [employer] in [place].  Review of Systems: As per "subjective", otherwise negative.  Allergies  Allergen Reactions  . Bactrim [Sulfamethoxazole-Trimethoprim] Rash  . Codeine Nausea And Vomiting  . Mobic [Meloxicam]     swelling  . Morphine And Related Nausea Only and Other (See Comments)    confusion  . Silvadene [Silver Sulfadiazine]     Red hot rash and blisters  . Adhesive [Tape] Rash    Band aid brand  laxex    Current Outpatient Medications  Medication Sig Dispense Refill  . albuterol (PROAIR HFA) 108 (90 BASE) MCG/ACT inhaler Inhale 2 puffs into the lungs every 4 (four) hours as needed.     Marland Kitchen. aspirin EC 81 MG tablet Take 81 mg by mouth daily.     . Cholecalciferol (VITAMIN D) 2000 units tablet Take 2,000 Units by mouth daily.    . clonazePAM (KLONOPIN) 1 MG tablet Take 0.5 tablets (0.5 mg total) by mouth at bedtime. 90 tablet 0  . clopidogrel (PLAVIX) 75 MG tablet TAKE 1 TABLET EVERY DAY 90 tablet 3  . Coenzyme Q10 (COQ10) 100 MG CAPS Take 100 mg by mouth daily.     Marland Kitchen. ENTRESTO 24-26 MG TAKE 1 TABLET TWICE DAILY 180 tablet 3  . ezetimibe (ZETIA) 10 MG tablet Take 10 mg by mouth daily.    Marland Kitchen. ezetimibe (ZETIA) 10 MG tablet TAKE ONE TABLET BY MOUTH ONCE A DAY  90 tablet 3  . fluticasone (FLONASE) 50 MCG/ACT nasal spray Place 2 sprays into both nostrils daily as needed for allergies or rhinitis.    . furosemide (LASIX) 40 MG tablet TAKE 1 TABLET TWICE DAILY 180 tablet 3  . gabapentin (NEURONTIN) 800 MG tablet Take 800 mg by mouth 3 (three) times daily.     . insulin aspart (NOVOLOG) 100 UNIT/ML injection Inject into the skin See admin instructions. 1.25 Units 12 Am-7 Am,  1.75 u/hr 7 AM  Til 12 AM, Bolus 1 unit for every 15 carbs, 1 Unit for every 50 points greater than 150 Insulin pump    . levocetirizine (XYZAL) 5 MG tablet Take 5 mg by mouth daily.     . metoprolol succinate (TOPROL-XL) 25 MG 24 hr tablet Take 1 tablet (25 mg total) by mouth 2 (two) times daily. 180 tablet 3  . PARoxetine (PAXIL) 20 MG tablet Take 20 mg by mouth at bedtime.     . polyethylene glycol (MIRALAX / GLYCOLAX) packet Take 17 g by mouth daily as needed for moderate constipation.     . Probiotic Product (PROBIOTIC-10 PO) Take 1 tablet by mouth daily.    . ranitidine (ZANTAC) 300 MG tablet Take 300 mg by mouth daily.     . rosuvastatin (CRESTOR) 40 MG tablet TAKE ONE TABLET BY MOUTH ONCE A DAY. STOP LIPITOR 30 tablet 6  No current facility-administered medications for this visit.     Past Medical History:  Diagnosis Date  . Anemia   . Anxiety   . Arthritis   . Carotid artery disease (HCC)    a. Last carotid study in 2014 showed 1-39% RICA, 40-59% LICA.  Marland Kitchen Chronic combined systolic (congestive) and diastolic (congestive) heart failure (HCC)    a. EF 40-45% in 2014. b. EF 30-35% in 07/2017.  . CKD (chronic kidney disease), stage III (HCC)   . Coronary artery disease    a. s/p CABG 2014. b. Cath 07/2017 for NSTEMI -> med rx.  . Diabetes mellitus (HCC)   . Diabetic peripheral neuropathy (HCC)   . Eczema   . GERD (gastroesophageal reflux disease)   . H/O hiatal hernia   . Headache(784.0)    allergies, sinus  . Hyperlipidemia   . Hypertension   . Myocardial  infarction (HCC)   . PONV (postoperative nausea and vomiting)   . Sleep apnea   . Umbilical hernia     Past Surgical History:  Procedure Laterality Date  . ABDOMINAL AORTOGRAM W/LOWER EXTREMITY N/A 03/06/2018   Procedure: ABDOMINAL AORTOGRAM W/LOWER EXTREMITY;  Surgeon: Iran Ouch, MD;  [facility]: [facility];  Service: Cardiovascular;  Laterality: N/A;  . ABDOMINAL HYSTERECTOMY    . 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 CARDIAC CATHETERIZATION    . CARPAL TUNNEL RELEASE    . CESAREAN SECTION  1983  . CORONARY ARTERY BYPASS GRAFT N/A 04/11/2013   Procedure: CORONARY ARTERY BYPASS GRAFTING (CABG);  Surgeon: Alleen Borne, MD;  [facility];  Service: Open Heart Surgery;  Laterality: N/A;  . EYE SURGERY Bilateral    laser  . FRACTURE SURGERY Left 2005  . RIGHT/LEFT HEART CATH AND CORONARY/GRAFT ANGIOGRAPHY N/A 08/06/2017   Procedure: RIGHT/LEFT HEART CATH AND CORONARY/GRAFT ANGIOGRAPHY;  Surgeon: Yvonne Kendall, MD;  Location: [facility];  Service: Cardiovascular;  Laterality: N/A;    Social History   Socioeconomic History  . Marital status: Married    Spouse name: Not on file  . Number of children: Not on file  . Years of education: Not on file  . Highest education level: Not on file  Occupational History  . Not on file  Social Needs  . Financial resource strain: Not on file  . Food insecurity:    Worry: Not on file    Inability: Not on file  . Transportation needs:    Medical: Not on file    Non-medical: Not on file  Tobacco Use  . Smoking status: Former Smoker    Years: 25.00    Types: Cigarettes    Last attempt to quit: 05/30/2010    Years since quitting: 8.2  . Smokeless tobacco: Never Used  . Tobacco comment: STOPPED 2007  Substance and Sexual Activity  . Alcohol use: No  . Drug use: No  . Sexual activity: Not on file  Lifestyle  . Physical activity:    Days per week: Not on file    Minutes per session: Not on file  . Stress: Not  on file  Relationships  . Social connections:    Talks on phone: Not on file    Gets together: Not on file    Attends religious service: Not on file    Active member of club or organization: Not on file    Attends meetings of clubs or organizations: Not on file    Relationship status:  Not on file  . Intimate partner violence:    Fear of current or ex partner: Not on file    Emotionally abused: Not on file    Physically abused: Not on file    Forced sexual activity: Not on file  Other Topics Concern  . Not on file  Social History Narrative  . Not on file     Vitals:   08/16/18 1448  BP: (!) 118/56  Pulse: 60  SpO2: 98%  Weight: 208 lb (94.3 kg)  Height: 5\' 3"  (1.6 m)    Wt Readings from Last 3 Encounters:  08/16/18 208 lb (94.3 kg)  04/02/18 201 lb (91.2 kg)  03/06/18 200 lb (90.7 kg)     PHYSICAL EXAM General: NAD HEENT: Right eye conjunctival injection on the lateral aspect. Neck: No JVD, no thyromegaly. Lungs: Diminished sounds throughout, no crackles or wheezes. CV: Regular rate and rhythm, normal S1/S2, no S3/S4, no murmur. No pretibial or periankle edema.  No carotid bruit.   Abdomen: Soft, nontender, no distention.  Neurologic: Alert and oriented.  Psych: Normal affect. Skin: Normal. Musculoskeletal: No gross deformities.    ECG: Reviewed above under Subjective   Labs: Lab Results  Component Value Date/Time   K 6.0 (HH) 02/19/2018 11:52 AM   BUN 40 (H) 02/19/2018 11:52 AM   CREATININE 1.50 (H) 02/19/2018 11:52 AM   ALT 20 08/22/2017 02:47 PM   HGB 10.3 (L) 02/19/2018 11:52 AM     Lipids: Lab Results  Component Value Date/Time   LDLCALC 118 (H) 08/22/2017 02:48 PM   CHOL 183 08/22/2017 02:48 PM   TRIG 143 08/22/2017 02:48 PM   HDL 36 (L) 08/22/2017 02:48 PM       ASSESSMENT AND PLAN: 1. Coronary disease with history of CABG: Symptomatically stable. Continue aspirin, Toprol-XL 25 mg twice daily, and Crestor 40 mg. I will discontinue  Plavix.  2. Chronic combined CHF: Euvolemic. LVEF 45 to 50% by echocardiogram on 12/21/2017.  Continue Lasix 40 mg daily. She has been instructed to take an extra 40 mg should weight increased by 3 pounds in 24 hours. I will continue Toprol-XL 25 mg twice daily and Entresto 24-26 mg twice daily.  3. Chronic kidney disease stage III: BUN 40, creatinine 1.5 on 02/19/2018.  4. Carotid artery disease: Mild bilateral carotid artery disease with stenoses of less than 50%. Continue aspirin and statin. I may consider repeating in 3 years.  5. Hyperlipidemia: LDL down to 48 on 10/08/2017.   Continue Crestor 40 mg.  6. Anemia: Hemoglobin 10.3 on 02/19/2018.  7. Hypertension: Blood pressure is normal.  No changes to therapy.  8.    Peripheral arterial disease: Follows with Dr. Kirke Corin.  Continue aspirin and statin.    Disposition: Follow up 1 year   Prentice Docker, M.D., F.A.C.C.

## 2018-08-16 NOTE — Patient Instructions (Addendum)
Medication Instructions:   Stop Plavix  Continue all other medications.    Labwork: none  Testing/Procedures: none  Follow-Up: Your physician wants you to follow up in:  1 year.  You will receive a reminder letter in the mail one-two months in advance.  If you don't receive a letter, please call our office to schedule the follow up appointment   Any Other Special Instructions Will Be Listed Below (If Applicable).  If you need a refill on your cardiac medications before your next appointment, please call your pharmacy.  

## 2018-08-20 ENCOUNTER — Other Ambulatory Visit: Payer: Self-pay | Admitting: Cardiovascular Disease

## 2018-11-22 ENCOUNTER — Other Ambulatory Visit: Payer: Self-pay | Admitting: Cardiovascular Disease

## 2018-12-30 ENCOUNTER — Telehealth: Payer: Self-pay | Admitting: *Deleted

## 2018-12-30 NOTE — Telephone Encounter (Signed)
A message was left, re: follow up visit. 

## 2019-03-18 ENCOUNTER — Telehealth: Payer: Self-pay | Admitting: Cardiovascular Disease

## 2019-03-18 NOTE — Telephone Encounter (Signed)
Patient called stating that she cannot afford to take Entresto. [phone]

## 2019-03-19 MED ORDER — ENTRESTO 24-26 MG PO TABS: 1.0000 | ORAL_TABLET | Freq: Two times a day (BID) | ORAL | 0 refills | Status: DC

## 2019-03-19 NOTE — Telephone Encounter (Signed)
Spoke with patient and she says that her Kiara [name] is now costing $549/month and says she is not in the donut hole. Currently has enough medication for 2 weeks. Samples provided for 1 month and patient assistance application to be filled out. Patient will bring proof of income as well as OOP expenses to office when she comes to get samples and fill out application. Verbalized understanding of plan.

## 2019-03-19 NOTE — Telephone Encounter (Signed)
Patient returned call  [phone]

## 2019-03-25 ENCOUNTER — Other Ambulatory Visit: Payer: Self-pay | Admitting: Cardiovascular Disease

## 2019-04-04 ENCOUNTER — Encounter: Payer: Self-pay | Admitting: Cardiovascular Disease

## 2019-04-08 ENCOUNTER — Ambulatory Visit (INDEPENDENT_AMBULATORY_CARE_PROVIDER_SITE_OTHER): Payer: Medicare PPO | Admitting: Cardiovascular Disease

## 2019-04-08 ENCOUNTER — Other Ambulatory Visit: Payer: Self-pay

## 2019-04-08 VITALS — BP 108/60 | HR 66 | Ht 63.0 in | Wt 211.5 lb

## 2019-04-08 DIAGNOSIS — I739 Peripheral vascular disease, unspecified: Secondary | ICD-10-CM

## 2019-04-08 DIAGNOSIS — I10 Essential (primary) hypertension: Secondary | ICD-10-CM

## 2019-04-08 DIAGNOSIS — I2510 Atherosclerotic heart disease of native coronary artery without angina pectoris: Secondary | ICD-10-CM | POA: Diagnosis not present

## 2019-04-08 DIAGNOSIS — E785 Hyperlipidemia, unspecified: Secondary | ICD-10-CM

## 2019-04-08 NOTE — Progress Notes (Signed)
Cardiology Office Note   Date:  04/08/2019   ID:  Kiara [name], DOB 12-23-1950, MRN [id number]  PCP:  Allie Dimmer, MD  Cardiologist:  Dr. Bronson Ing  Chief Complaint  Patient presents with  . other    6 month follow up. Patient c/o swelling in Right ankle. Meds reviewed verbally with patient.       History of Present Illness: Kiara [name] is a 68 y.o. female who is here today for follow-up visit regarding peripheral arterial disease . She has known history of coronary artery disease status post CABG, chronic systolic heart failure, diabetes mellitus, hyperlipidemia and diabetes mellitus.  She is been diabetic for 60 years.  She had amputation of the right lateral toes in 2006. She had a non-ST elevation myocardial infarction in January of 2019.  Cardiac catheterization via the right femoral artery showed severe native vessel coronary artery disease with patent LIMA to LAD and patent SVG to right PDA.  Filling pressures were severely elevated with severe pulmonary hypertension.  Medical therapy was recommended.   She was seen for severe bilateral leg claudication which started in cardiac rehab.  Vascular studies showed an ABI of 0.92 on the right and 0.93 on the left.  Toe pressure was significantly abnormal bilaterally.    Duplex showed moderate bilateral common femoral artery and SFA disease. CO2 angiography was performed in August 2019 which showed mild disease affecting the right common femoral and SFA disease with three-vessel runoff below the knee.  In the left, there was moderate calcified common iliac artery stenosis and moderate distal SFA disease.  The disease was not significant enough to explain severity of her symptoms. She has been stable overall.  She reports worsening lower back discomfort radiation to both sides of her legs.  This is worse with certain positions and leaning over.  Past Medical History:  Diagnosis Date  . Anemia   . Anxiety   . Arthritis   .  Carotid artery disease ([place])    a. Last carotid study in 2014 showed 0-35% RICA, 00-93% LICA.  Marland Kitchen Chronic combined systolic (congestive) and diastolic (congestive) heart failure (HCC)    a. EF 40-45% in 2014. b. EF 30-35% in 07/2017.  . CKD (chronic kidney disease), stage III ([place])   . Coronary artery disease    a. s/p CABG 2014. b. Cath 07/2017 for NSTEMI -> med rx.  . Diabetes mellitus ([place])   . Diabetic peripheral neuropathy ([place])   . Eczema   . GERD (gastroesophageal reflux disease)   . H/O hiatal hernia   . Headache(784.0)    allergies, sinus  . Hyperlipidemia   . Hypertension   . Myocardial infarction ([place])   . PONV (postoperative nausea and vomiting)   . Sleep apnea   . Umbilical hernia     Past Surgical History:  Procedure Laterality Date  . ABDOMINAL AORTOGRAM W/LOWER EXTREMITY N/A 03/06/2018   Procedure: ABDOMINAL AORTOGRAM W/LOWER EXTREMITY;  Surgeon: Wellington Hampshire, MD;  Location: [facility];  Service: Cardiovascular;  Laterality: N/A;  . ABDOMINAL HYSTERECTOMY    . 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 CARDIAC CATHETERIZATION    . CARPAL TUNNEL RELEASE    . CESAREAN SECTION  1983  . CORONARY ARTERY BYPASS GRAFT N/A 04/11/2013   Procedure: CORONARY ARTERY BYPASS GRAFTING (CABG);  Surgeon: Gaye Pollack, MD;  Location: [facility];  Service: Open Heart Surgery;  Laterality: N/A;  . EYE SURGERY Bilateral  laser  . FRACTURE SURGERY Left 2005  . RIGHT/LEFT HEART CATH AND CORONARY/GRAFT ANGIOGRAPHY N/A 08/06/2017   Procedure: RIGHT/LEFT HEART CATH AND CORONARY/GRAFT ANGIOGRAPHY;  Surgeon: Yvonne KendallEnd, Christopher, MD;  Location: [facility];  Service: Cardiovascular;  Laterality: N/A;     Current Outpatient Medications  Medication Sig Dispense Refill  . albuterol (PROAIR HFA) 108 (90 BASE) MCG/ACT inhaler Inhale 2 puffs into the lungs every 4 (four) hours as needed.     Marland Kitchen. aspirin EC 81 MG tablet Take 81 mg by mouth daily.     . Cholecalciferol (VITAMIN D) 2000  units tablet Take 2,000 Units by mouth daily.    . clonazePAM (KLONOPIN) 1 MG tablet Take 0.5 tablets (0.5 mg total) by mouth at bedtime. 90 tablet 0  . Coenzyme Q10 (COQ10) 100 MG CAPS Take 100 mg by mouth daily.     Marland Kitchen. ezetimibe (ZETIA) 10 MG tablet Take 10 mg by mouth daily.    . fluticasone (FLONASE) 50 MCG/ACT nasal spray Place 2 sprays into both nostrils daily as needed for allergies or rhinitis.    . furosemide (LASIX) 40 MG tablet TAKE 1 TABLET TWICE DAILY 180 tablet 3  . gabapentin (NEURONTIN) 800 MG tablet Take 800 mg by mouth 3 (three) times daily.     . insulin aspart (NOVOLOG) 100 UNIT/ML injection Inject into the skin See admin instructions. 1.25 Units 12 Am-7 Am,  1.75 u/hr 7 AM  Til 12 AM, Bolus 1 unit for every 15 carbs, 1 Unit for every 50 points greater than 150 Insulin pump    . levocetirizine (XYZAL) 5 MG tablet Take 5 mg by mouth daily.     . metoprolol succinate (TOPROL-XL) 25 MG 24 hr tablet TAKE 1 TABLET TWICE DAILY 180 tablet 3  . omeprazole (PRILOSEC) 40 MG capsule TAKE 1 CAPSULE EVERY DAY    . PARoxetine (PAXIL) 20 MG tablet Take 20 mg by mouth at bedtime.     . polyethylene glycol (MIRALAX / GLYCOLAX) packet Take 17 g by mouth daily as needed for moderate constipation.     . Probiotic Product (PROBIOTIC-10 PO) Take 1 tablet by mouth daily.    . rosuvastatin (CRESTOR) 40 MG tablet TAKE ONE TABLET BY MOUTH ONCE A DAY. STOP LIPITOR 30 tablet 6  . sacubitril-valsartan (ENTRESTO) 24-26 MG Take 1 tablet by mouth 2 (two) times daily. 56 tablet 0   No current facility-administered medications for this visit.     Allergies:   Bactrim [sulfamethoxazole-trimethoprim], Codeine, Mobic [meloxicam], Morphine and related, Silvadene [silver sulfadiazine], and Adhesive [tape]    Social History:  The patient  reports that she quit smoking about 8 years ago. Her smoking use included cigarettes. She quit after 25.00 years of use. She has never used smokeless tobacco. She reports that  she does not drink alcohol or use drugs.   Family History:  The patient's family history includes Heart attack in her brother and mother; Heart disease in her brother and mother; Hypertension in her brother and mother.    ROS:  Please see the history of present illness.   Otherwise, review of systems are positive for none.   All other systems are reviewed and negative.    PHYSICAL EXAM: VS:  BP 108/60 (BP Location: Left Arm, Patient Position: Sitting, Cuff Size: Normal)   Pulse 66   Ht 5\' 3"  (1.6 m)   Wt 211 lb 8 oz (95.9 kg)   BMI 37.47 kg/m  , BMI Body mass index is  37.47 kg/m. GEN: Well nourished, well developed, in no acute distress  HEENT: normal  Neck: no JVD, carotid bruits, or masses Cardiac: RRR; no murmurs, rubs, or gallops,no edema  Respiratory:  clear to auscultation bilaterally, normal work of breathing GI: soft, nontender, nondistended, + BS MS: no deformity or atrophy  Skin: warm and dry, no rash Neuro:  Strength and sensation are intact Psych: euthymic mood, full affect Vascular: Femoral pulses +1 bilaterally.    EKG:  EKG is ordered today. EKG showed normal sinus rhythm with possible left atrial enlargement and low voltage.   Recent Labs: No results found for requested labs within last 8760 hours.    Lipid Panel    Component Value Date/Time   CHOL 183 08/22/2017 1448   TRIG 143 08/22/2017 1448   HDL 36 (L) 08/22/2017 1448   CHOLHDL 5.1 08/22/2017 1448   VLDL 29 08/22/2017 1448   LDLCALC 118 (H) 08/22/2017 1448      Wt Readings from Last 3 Encounters:  04/08/19 211 lb 8 oz (95.9 kg)  08/16/18 208 lb (94.3 kg)  04/02/18 201 lb (91.2 kg)       No flowsheet data found.    ASSESSMENT AND PLAN:  1.  Peripheral arterial disease: The patient has mild to moderate nonobstructive peripheral arterial disease which does not explain her symptoms.  Her current symptoms seem to be suggestive of possible spinal stenosis.  She has an appointment already  with a spine specialist which I agree with.  Continue medical therapy for peripheral arterial disease.   2.  Coronary artery disease involving native coronary arteries: Symptoms are well controlled at the present time.  3.  Chronic systolic heart failure: She appears to be euvolemic currently on optimal medical therapy.  4.  Hyperlipidemia: Currently on rosuvastatin and Zetia.  She needs lipid and liver profile this year.  Disposition:   FU with me in 12 months  Signed,  Lorine Bears, MD  04/08/2019 11:04 AM    [facility]

## 2019-04-08 NOTE — Patient Instructions (Addendum)
Medication Instructions:  NO CHANGES   If you need a refill on your cardiac medications before your next appointment, please call your pharmacy.   Lab work: NOT NEEDED   Testing/Procedures:  NOT NEEDED  Follow-Up: At [facility], you and your health needs are our priority.  As part of our continuing mission to provide you with exceptional heart care, we have created designated Provider Care Teams.  These Care Teams include your primary Cardiologist (physician) and Advanced Practice Providers (APPs -  Physician Assistants and Nurse Practitioners) who all work together to provide you with the care you need, when you need it. You will need a follow up appointment in 12  months -SEPT 2021.  Please call our office 2 months in advance to schedule this appointment.  You may see Dr Fletcher Anon    Any Other Special Instructions Will Be Listed Below (If Applicable).

## 2019-04-15 ENCOUNTER — Telehealth: Payer: Self-pay | Admitting: *Deleted

## 2019-04-15 NOTE — Telephone Encounter (Signed)
Received fax from [employer] - patient has been approved for assistance through the remainder of the calendar year for her Kiara [name].

## 2019-05-29 ENCOUNTER — Other Ambulatory Visit: Payer: Self-pay | Admitting: Cardiovascular Disease

## 2019-06-25 ENCOUNTER — Other Ambulatory Visit: Payer: Self-pay | Admitting: Cardiovascular Disease

## 2019-07-14 ENCOUNTER — Telehealth: Payer: Self-pay | Admitting: Cardiovascular Disease

## 2019-07-14 NOTE — Telephone Encounter (Signed)
Patient walked in stating that she is having episodes of her heart racing. States that her ankles continue to swell.

## 2019-07-14 NOTE — Telephone Encounter (Signed)
Left message to return call 

## 2019-07-15 ENCOUNTER — Other Ambulatory Visit: Payer: Self-pay | Admitting: Cardiovascular Disease

## 2019-07-15 NOTE — Telephone Encounter (Signed)
Been having episodes of heart racing x 1 1/2 weeks.  Noticed that her ankles started swelling after this started.   No chest pain or dizziness.  Does note some SOB, but no more than usual.  BP running 120/65 & has hard time catching the heart rate - 78-80 when she checks on her own.  Is under a lot of stress with taking care of her mother lately.   Appointment scheduled for 07/27/2020 with Cecilie Kicks, NP in [place] office.    Any further suggestions in the meantime.

## 2019-07-16 NOTE — Telephone Encounter (Signed)
HR appears to be within normal range as per her recording. Will await 12/28 evaluation by L. In[place].

## 2019-07-17 NOTE — Telephone Encounter (Signed)
Patient notified and verbalized understanding. 

## 2019-07-27 NOTE — Progress Notes (Signed)
Cardiology Office Note   Date:  07/28/2019   ID:  Kiara [name], DOB [birth year], MRN [id number]  PCP:  Kiara Dimmer, MD  Cardiologist:  Dr. Bronson [name].    PV Dr. Fletcher [name].  Chief Complaint  Patient presents with  . Tachycardia      History of Present Illness: Kiara [name] is a 68 y.o. female who presents for heart racing.    She has a hx of PMH of CAD s/p CABG (SVG-PDA, LIMA-LAD) '14, HTN, HL, DM type 1, OSA on Cpap who had last cath 2019 after presenting with acute on chronic combined systolic and diastolic HF. + pulmoanry HTN.  Also with mastoiditis at that time.  Cath 08/06/17 with severe native CAD diffusely diseased, subtotally occluded LAD, as well as chronic total occlusions of OM1 and mid RCA.  Patent LIMA->LAD, though LAD is occluded distal to LIMA anastomosis.  Patent SVG->RPDA, though graft cannot be selectively engaged and is incompletely visualized.  With elevated pressures placed in ICU for aggressive diuresis.  She did well and discharged.  Last visit 08/16/18 Her Plavix was stopped and no angina.  She is on entresto and BB, also lasix 40 daily and last EF 45-50%  Her PAD followed by Dr. Fletcher [name] and has mild to moderate nonobstructive peripheral arterial disease which does not explain her symptoms.  Her current symptoms seem to be suggestive of possible spinal stenosis  Today she has been having episodes of rapid HR and she can hear her heartbeat in her ears.  No chest pain. Some SOB and edema.  The rapid HR lasts for 20-30 min until she rests.  Occurs 3-4 times per day.  Happened last night and she had to sit up to catch her breath.  .  She does not use salt or very little in her diet.    She discusses caring for her mother.  Her mother is not up much anymore so more physical care is on Ms. Kiara [name].   She will call her mother's MD to see what needs to be done.  She has been to see ENT for Lt ear infection.  She has had laser surgery on Rt eye for diabetic disease and now  eye red.     Past Medical History:  Diagnosis Date  . Anemia   . Anxiety   . Arthritis   . Carotid artery disease ([place])    a. Last carotid study in 2014 showed 2-42% RICA, 68-34% LICA.  Marland Kitchen Chronic combined systolic (congestive) and diastolic (congestive) heart failure (H[facility])    a. EF 40-45% in 2014. b. EF 30-35% in 07/2017.  . CKD (chronic kidney disease), stage III   . Coronary artery disease    a. s/p CABG 2014. b. Cath 07/2017 for NSTEMI -> med rx.  . Diabetes mellitus ([place])   . Diabetic peripheral neuropathy ([place])   . Eczema   . GERD (gastroesophageal reflux disease)   . H/O hiatal hernia   . Headache(784.0)    allergies, sinus  . Hyperlipidemia   . Hypertension   . Myocardial infarction ([place])   . PONV (postoperative nausea and vomiting)   . Sleep apnea   . Umbilical hernia     Past Surgical History:  Procedure Laterality Date  . ABDOMINAL AORTOGRAM W/LOWER EXTREMITY N/A 03/06/2018   Procedure: ABDOMINAL AORTOGRAM W/LOWER EXTREMITY;  Surgeon: Wellington Hampshire, MD;  Location: [facility];  Service: Cardiovascular;  Laterality: N/A;  . ABDOMINAL HYSTERECTOMY    . amputation  of right 4th and 5th toe    . CARDIAC CATHETERIZATION    . CARPAL TUNNEL RELEASE    . CESAREAN SECTION  1983  . CORONARY ARTERY BYPASS GRAFT N/A 04/11/2013   Procedure: CORONARY ARTERY BYPASS GRAFTING (CABG);  Surgeon: Alleen Borne, MD;  Location: [facility];  Service: Open Heart Surgery;  Laterality: N/A;  . EYE SURGERY Bilateral    laser  . FRACTURE SURGERY Left 2005  . RIGHT/LEFT HEART CATH AND CORONARY/GRAFT ANGIOGRAPHY N/A 08/06/2017   Procedure: RIGHT/LEFT HEART CATH AND CORONARY/GRAFT ANGIOGRAPHY;  Surgeon: Yvonne Kendall, MD;  Location: [facility];  Service: Cardiovascular;  Laterality: N/A;     Current Outpatient Medications  Medication Sig Dispense Refill  . albuterol (PROAIR HFA) 108 (90 BASE) MCG/ACT inhaler Inhale 2 puffs into the lungs every 4 (four) hours as needed.     Marland Kitchen  aspirin EC 81 MG tablet Take 81 mg by mouth daily.     . Cholecalciferol (VITAMIN D) 2000 units tablet Take 2,000 Units by mouth daily.    . clopidogrel (PLAVIX) 75 MG tablet TAKE 1 TABLET EVERY DAY 90 tablet 3  . Coenzyme Q10 (COQ10) 100 MG CAPS Take 100 mg by mouth daily.     Marland Kitchen ezetimibe (ZETIA) 10 MG tablet Take 10 mg by mouth daily.    . fluticasone (FLONASE) 50 MCG/ACT nasal spray Place 2 sprays into both nostrils daily as needed for allergies or rhinitis.    . furosemide (LASIX) 40 MG tablet TAKE 1 TABLET TWICE DAILY 180 tablet 3  . gabapentin (NEURONTIN) 800 MG tablet Take 800 mg by mouth 3 (three) times daily.     . insulin aspart (NOVOLOG) 100 UNIT/ML injection Inject into the skin See admin instructions. 1.25 Units 12 Am-7 Am,  1.75 u/hr 7 AM  Til 12 AM, Bolus 1 unit for every 15 carbs, 1 Unit for every 50 points greater than 150 Insulin pump    . levocetirizine (XYZAL) 5 MG tablet Take 5 mg by mouth daily.     . metoprolol succinate (TOPROL-XL) 25 MG 24 hr tablet TAKE 1 TABLET TWICE DAILY 180 tablet 3  . omeprazole (PRILOSEC) 40 MG capsule TAKE 1 CAPSULE EVERY DAY    . PARoxetine (PAXIL) 20 MG tablet Take 20 mg by mouth at bedtime.     . polyethylene glycol (MIRALAX / GLYCOLAX) packet Take 17 g by mouth daily as needed for moderate constipation.     . Probiotic Product (PROBIOTIC-10 PO) Take 1 tablet by mouth daily.    . rosuvastatin (CRESTOR) 40 MG tablet TAKE ONE TABLET BY MOUTH ONCE A DAY. STOP LIPITOR 30 tablet 1  . sacubitril-valsartan (ENTRESTO) 24-26 MG Take 1 tablet by mouth 2 (two) times daily. 56 tablet 0   No current facility-administered medications for this visit.    Allergies:   Bactrim [sulfamethoxazole-trimethoprim], Codeine, Mobic [meloxicam], Morphine and related, Silvadene [silver sulfadiazine], and Adhesive [tape]    Social History:  The patient  reports that she quit smoking about 9 years ago. Her smoking use included cigarettes. She quit after 25.00 years of  use. She has never used smokeless tobacco. She reports that she does not drink alcohol or use drugs.   Family History:  The patient's family history includes Heart attack in her brother and mother; Heart disease in her brother and mother; Hypertension in her brother and mother.    ROS:  General:no colds or fevers, + weight gain Skin:no rashes or ulcers HEENT:no blurred vision,  no congestion- see above CV:see HPI PUL:see HPI GI:no diarrhea constipation or melena, no indigestion GU:no hematuria, no dysuria MS:no joint pain, no claudication Neuro:no syncope, no lightheadedness Endo:+ diabetes, no thyroid disease  Wt Readings from Last 3 Encounters:  07/28/19 215 lb (97.5 kg)  04/08/19 211 lb 8 oz (95.9 kg)  08/16/18 208 lb (94.3 kg)     PHYSICAL EXAM: VS:  BP (!) 124/48   Pulse 85   Temp (!) 97.5 F (36.4 C) (Temporal)   Ht 5\' 3"  (1.6 m)   Wt 215 lb (97.5 kg)   SpO2 95%   BMI 38.09 kg/m  , BMI Body mass index is 38.09 kg/m. General:Pleasant affect, NAD Skin:Warm and dry, brisk capillary refill HEENT:normocephalic, sclera clear, mucus membranes moist Neck:supple, no JVD, no bruits  Heart:S1S2 RRR without murmur, gallup, rub or click Lungs:clear without rales, rhonchi, or wheezes IYM:EBRA, non tender, + BS, do not palpate liver spleen or masses Ext:1+ lower ext edema, 2+ pedal pulses, 2+ radial pulses Neuro:alert and oriented, MAE, follows commands, + facial symmetry    EKG:  EKG is NOT ordered today.    Recent Labs: No results found for requested labs within last 8760 hours.    Lipid Panel    Component Value Date/Time   CHOL 183 08/22/2017 1448   TRIG 143 08/22/2017 1448   HDL 36 (L) 08/22/2017 1448   CHOLHDL 5.1 08/22/2017 1448   VLDL 29 08/22/2017 1448   LDLCALC 118 (H) 08/22/2017 1448       Other studies Reviewed: Additional studies/ records that were reviewed today include: . Echo 12/21/17 Study Conclusions  - Left ventricle: Limited study for  evaluation of LVEF. The cavity   size was normal. Wall thickness was normal. Systolic function was   mildly reduced. The estimated ejection fraction was in the range   of 45% to 50%. There is hypokinesis of the lateral myocardium. No   obvious LV mural thrombus with Definity contrast. Indeterminate   diastolic function. - Mitral valve: Mildly calcified annulus. There was trivial   regurgitation. - Tricuspid valve: There was trivial regurgitation. - Pericardium, extracardiac: There was no pericardial effusion.   ASSESSMENT AND PLAN:  1.  Tachycardia- symptomatic at night and during the day with SOB - will check event monitor and TSH BMP and CBC.  Abnormalities could cause tachycardia will have her wear 14 day zio patch to eval arrhythmias  Will see her back in 2-3 weeks   2.  SOB with hx of LV dysfunction.  EF 45-50%  Will recheck echo to eval EF and valves.  Will check BNP   3.  Chronic combined CHF with some edema, will increase lasix to BID for 5 days then back to once per day except MWF then twice per day .  Check BMP   4.  Chronic back pain. She is seeing chiropractor   5.  PAD per Dr. Kirke Corin and stable.    Current medicines are reviewed with the patient today.  The patient Has no concerns regarding medicines.  The following changes have been made:  See above Labs/ tests ordered today include:see above  Disposition:   FU:  see above  Signed, Nada Boozer, NP  07/28/2019 1:31 PM    [name] [address] [place], [place], [place]  [address]/ [address] [place], [place] Phone: [phone]; Fax: [phone]  [phone]

## 2019-07-28 ENCOUNTER — Ambulatory Visit (INDEPENDENT_AMBULATORY_CARE_PROVIDER_SITE_OTHER): Payer: Medicare PPO | Admitting: Cardiology

## 2019-07-28 ENCOUNTER — Encounter: Payer: Self-pay | Admitting: Cardiology

## 2019-07-28 ENCOUNTER — Telehealth: Payer: Self-pay | Admitting: Cardiovascular Disease

## 2019-07-28 ENCOUNTER — Other Ambulatory Visit (HOSPITAL_COMMUNITY)
Admission: RE | Admit: 2019-07-28 | Discharge: 2019-07-28 | Disposition: A | Discharge status: Home / Self Care | Payer: Medicare PPO | Source: Ambulatory Visit | Attending: Cardiology | Admitting: Cardiology

## 2019-07-28 ENCOUNTER — Other Ambulatory Visit: Payer: Self-pay

## 2019-07-28 VITALS — BP 124/48 | HR 85 | Temp 97.5°F | Ht 63.0 in | Wt 215.0 lb

## 2019-07-28 DIAGNOSIS — I739 Peripheral vascular disease, unspecified: Secondary | ICD-10-CM

## 2019-07-28 DIAGNOSIS — D649 Anemia, unspecified: Secondary | ICD-10-CM

## 2019-07-28 DIAGNOSIS — R002 Palpitations: Secondary | ICD-10-CM | POA: Insufficient documentation

## 2019-07-28 DIAGNOSIS — R000 Tachycardia, unspecified: Secondary | ICD-10-CM

## 2019-07-28 DIAGNOSIS — R0602 Shortness of breath: Secondary | ICD-10-CM

## 2019-07-28 DIAGNOSIS — R609 Edema, unspecified: Secondary | ICD-10-CM | POA: Diagnosis not present

## 2019-07-28 DIAGNOSIS — I519 Heart disease, unspecified: Secondary | ICD-10-CM

## 2019-07-28 DIAGNOSIS — I5042 Chronic combined systolic (congestive) and diastolic (congestive) heart failure: Secondary | ICD-10-CM

## 2019-07-28 LAB — CBC WITH DIFFERENTIAL/PLATELET
Abs Immature Granulocytes: 0.02 10*3/uL (ref 0.00–0.07)
Basophils Absolute: 0.1 10*3/uL (ref 0.0–0.1)
Basophils Relative: 1 %
Eosinophils Absolute: 0.2 10*3/uL (ref 0.0–0.5)
Eosinophils Relative: 3 %
HCT: 37 % (ref 36.0–46.0)
Hemoglobin: 11.7 g/dL — ABNORMAL LOW (ref 12.0–15.0)
Immature Granulocytes: 0 %
Lymphocytes Relative: 24 %
Lymphs Abs: 2 10*3/uL (ref 0.7–4.0)
MCH: 30.1 pg (ref 26.0–34.0)
MCHC: 31.6 g/dL (ref 30.0–36.0)
MCV: 95.1 fL (ref 80.0–100.0)
Monocytes Absolute: 0.6 10*3/uL (ref 0.1–1.0)
Monocytes Relative: 7 %
Neutro Abs: 5.4 10*3/uL (ref 1.7–7.7)
Neutrophils Relative %: 65 %
Platelets: 248 10*3/uL (ref 150–400)
RBC: 3.89 MIL/uL (ref 3.87–5.11)
RDW: 13.2 % (ref 11.5–15.5)
WBC: 8.3 10*3/uL (ref 4.0–10.5)
nRBC: 0 % (ref 0.0–0.2)

## 2019-07-28 LAB — BASIC METABOLIC PANEL
Anion gap: 10 (ref 5–15)
BUN: 30 mg/dL — ABNORMAL HIGH (ref 8–23)
CO2: 28 mmol/L (ref 22–32)
Calcium: 9 mg/dL (ref 8.9–10.3)
Chloride: 101 mmol/L (ref 98–111)
Creatinine, Ser: 1.63 mg/dL — ABNORMAL HIGH (ref 0.44–1.00)
GFR calc Af Amer: 38 mL/min — ABNORMAL LOW (ref >60)
GFR calc non Af Amer: 32 mL/min — ABNORMAL LOW (ref >60)
Glucose, Bld: 174 mg/dL — ABNORMAL HIGH (ref 70–99)
Potassium: 4.7 mmol/L (ref 3.5–5.1)
Sodium: 139 mmol/L (ref 135–145)

## 2019-07-28 LAB — TSH: TSH: 2.066 u[IU]/mL (ref 0.350–4.500)

## 2019-07-28 MED ORDER — EZETIMIBE 10 MG PO TABS: 10.0000 mg | ORAL_TABLET | Freq: Every day | ORAL | 3 refills | Status: DC

## 2019-07-28 MED ORDER — ROSUVASTATIN CALCIUM 40 MG PO TABS: ORAL_TABLET | ORAL | 3 refills | Status: DC

## 2019-07-28 MED ORDER — FUROSEMIDE 40 MG PO TABS: 40.0000 mg | ORAL_TABLET | Freq: Two times a day (BID) | ORAL | 3 refills | Status: DC

## 2019-07-28 NOTE — Patient Instructions (Signed)
Medication Instructions:  Take lasix 40 mg two times daily for 5 days, then take it 2 times daily on Monday, Wednesday & Friday.   Labwork: CBC TSH BMET  Testing/Procedures: Your physician has requested that you have an echocardiogram. Echocardiography is a painless test that uses sound waves to create images of your heart. It provides your doctor with information about the size and shape of your heart and how well your heart's chambers and valves are working. This procedure takes approximately one hour. There are no restrictions for this procedure.  Your physician has recommended that you wear an event monitor. Event monitors are medical devices that record the heart's electrical activity. Doctors most often [place] these monitors to diagnose arrhythmias. Arrhythmias are problems with the speed or rhythm of the heartbeat. The monitor is a small, portable device. You can wear one while you do your normal daily activities. This is usually used to diagnose what is causing palpitations/syncope (passing out). 23 DAY  Follow-Up: Your physician recommends that you schedule a follow-up appointment in: 2-3 WEEKS    Any Other Special Instructions Will Be Listed Below (If Applicable).     If you need a refill on your cardiac medications before your next appointment, please call your pharmacy.

## 2019-07-28 NOTE — Telephone Encounter (Signed)
  Precert needed for: Echo   Location: [facility]    Date: Jul 29, 2019

## 2019-07-29 ENCOUNTER — Other Ambulatory Visit: Payer: Self-pay | Admitting: Cardiology

## 2019-07-29 ENCOUNTER — Ambulatory Visit (INDEPENDENT_AMBULATORY_CARE_PROVIDER_SITE_OTHER): Payer: Medicare PPO

## 2019-07-29 DIAGNOSIS — R609 Edema, unspecified: Secondary | ICD-10-CM | POA: Diagnosis not present

## 2019-07-29 DIAGNOSIS — I509 Heart failure, unspecified: Secondary | ICD-10-CM

## 2019-07-30 ENCOUNTER — Telehealth: Payer: Self-pay

## 2019-07-30 DIAGNOSIS — R609 Edema, unspecified: Secondary | ICD-10-CM

## 2019-07-30 DIAGNOSIS — R002 Palpitations: Secondary | ICD-10-CM

## 2019-07-30 NOTE — Telephone Encounter (Signed)
Called pt, No answer, left message for pt to return call. Faxed lab slip to [facility].

## 2019-07-30 NOTE — Telephone Encounter (Signed)
-----   Message from Isaiah Serge, NP sent at 07/29/2019  3:52 PM EST ----- Labs with some kidney stress, may be due to volume overload.  Recheck BMP on Monday.   Blood count is normal

## 2019-08-11 ENCOUNTER — Encounter: Payer: Self-pay | Admitting: Cardiovascular Disease

## 2019-08-11 ENCOUNTER — Other Ambulatory Visit: Payer: Self-pay

## 2019-08-11 ENCOUNTER — Ambulatory Visit (INDEPENDENT_AMBULATORY_CARE_PROVIDER_SITE_OTHER): Payer: Medicare PPO | Admitting: Family Medicine

## 2019-08-11 VITALS — BP 122/50 | HR 76 | Ht 63.0 in | Wt 216.0 lb

## 2019-08-11 DIAGNOSIS — I739 Peripheral vascular disease, unspecified: Secondary | ICD-10-CM

## 2019-08-11 DIAGNOSIS — I2510 Atherosclerotic heart disease of native coronary artery without angina pectoris: Secondary | ICD-10-CM | POA: Diagnosis not present

## 2019-08-11 DIAGNOSIS — R002 Palpitations: Secondary | ICD-10-CM | POA: Diagnosis not present

## 2019-08-11 DIAGNOSIS — I5042 Chronic combined systolic (congestive) and diastolic (congestive) heart failure: Secondary | ICD-10-CM

## 2019-08-11 NOTE — Patient Instructions (Addendum)
Medication Instructions:   Your physician recommends that you continue on your current medications as directed. Please refer to the Current Medication list given to you today.  Labwork:  NONE  Testing/Procedures:  NONE  Follow-Up:  Your physician recommends that you schedule a follow-up appointment in: 2 months (office).  Any Other Special Instructions Will Be Listed Below (If Applicable).  If you need a refill on your cardiac medications before your next appointment, please call your pharmacy. 

## 2019-08-11 NOTE — Progress Notes (Signed)
Cardiology Office Note  Date: 08/11/2019   ID: Kiara [name], DOB 05/27/1951, MRN [id number]  PCP:  Majel Homer, MD  Cardiologist:  Prentice Docker, MD Electrophysiologist:  None   Chief Complaint  Patient presents with   Follow-up    History of Present Illness: Kiara [name] is a 69 y.o. female last seen by Lovie Chol July 28, 2019 for symptomatic tachycardia/complaints of heart racing.  History of coronary artery disease status post CABG (SVG/PDA, LIMA/LAD), hypertension, hyperlipidemia, type 2 diabetes, obstructive sleep apnea on CPAP.  Last cardiac catheterization 2019 after presenting with acute on chronic systolic and diastolic heart failure plus pulmonary hypertension.  Cardiac catheterization August 06, 2017 showed severe native coronary artery diffuse disease. Patient underwent aggressive diuresis in ICU did well and was discharged.  Last echo showed EF of 45 to 50% on Entresto and Lasix as well as beta-blocker.  Echocardiogram performed July 29, 2019 showed increased ejection fraction of 55 to 60%.  Mild LVH.  She has PAD with mild to moderate nonobstructive peripheral arterial disease.  See peripheral vascular catheterization report below.  Patient states she is maintaining her weight around 215 to 216 pounds.  No major increases in weight gain or peripheral edema noted.  She does have some mild edema in both lower extremities right greater than left.  She intermittently wears compression stockings.  States edema is usually better in the mornings and  worse after up and active during the day.  Continues to take Lasix 40 mg once a day.  She recently had an event monitor placed and states she turned it in today earlier.  Results are pending.  Reason for placement was sensation of palpitations/fluttering usually during the evening hours lasting sometimes from 20 to 30 minutes.  Patient states the palpitations are much shorter in duration but sometimes  nightly without exertion.  Sometimes palpitations occurring during exertional activity.    Past Medical History:  Diagnosis Date   Anemia    Anxiety    Arthritis    Carotid artery disease (HCC)    a. Last carotid study in 2014 showed 1-39% RICA, 40-59% LICA.   Chronic combined systolic (congestive) and diastolic (congestive) heart failure (HCC)    a. EF 40-45% in 2014. b. EF 30-35% in 07/2017.   CKD (chronic kidney disease), stage III    Coronary artery disease    a. s/p CABG 2014. b. Cath 07/2017 for NSTEMI -> med rx.   Diabetes mellitus (HCC)    Diabetic peripheral neuropathy (HCC)    Eczema    GERD (gastroesophageal reflux disease)    H/O hiatal hernia    Headache(784.0)    allergies, sinus   Hyperlipidemia    Hypertension    Myocardial infarction (HCC)    PONV (postoperative nausea and vomiting)    Sleep apnea    Umbilical hernia     Past Surgical History:  Procedure Laterality Date   ABDOMINAL AORTOGRAM W/LOWER EXTREMITY N/A 03/06/2018   Procedure: ABDOMINAL AORTOGRAM W/LOWER EXTREMITY;  Surgeon: Iran Ouch, MD;  Location: [facility];  Service: Cardiovascular;  Laterality: N/A;   ABDOMINAL HYSTERECTOMY     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     CARDIAC CATHETERIZATION     CARPAL TUNNEL RELEASE     CESAREAN SECTION  1983   CORONARY ARTERY BYPASS GRAFT N/A 04/11/2013   Procedure: CORONARY ARTERY BYPASS GRAFTING (CABG);  Surgeon: Alleen Borne, MD;  Location: [facility] OR;  Service: Open  Heart Surgery;  Laterality: N/A;   EYE SURGERY Bilateral    laser   FRACTURE SURGERY Left 2005   RIGHT/LEFT HEART CATH AND CORONARY/GRAFT ANGIOGRAPHY N/A 08/06/2017   Procedure: RIGHT/LEFT HEART CATH AND CORONARY/GRAFT ANGIOGRAPHY;  Surgeon: Nelva Bush, MD;  Location: [place] [facility];  Service: Cardiovascular;  Laterality: N/A;    Current Outpatient Medications  Medication Sig Dispense Refill   albuterol (PROAIR HFA) 108 (90 BASE) MCG/ACT  inhaler Inhale 2 puffs into the lungs every 4 (four) hours as needed.      aspirin EC 81 MG tablet Take 81 mg by mouth daily.      Cholecalciferol (VITAMIN D) 2000 units tablet Take 2,000 Units by mouth daily.     Coenzyme Q10 (COQ10) 100 MG CAPS Take 100 mg by mouth daily.      ezetimibe (ZETIA) 10 MG tablet Take 1 tablet (10 mg total) by mouth daily. 90 tablet 3   fluticasone (FLONASE) 50 MCG/ACT nasal spray Place 2 sprays into both nostrils daily as needed for allergies or rhinitis.     furosemide (LASIX) 40 MG tablet Take 1 tablet (40 mg total) by mouth 2 (two) times daily. 180 tablet 3   gabapentin (NEURONTIN) 800 MG tablet Take 800 mg by mouth 3 (three) times daily.      insulin aspart (NOVOLOG) 100 UNIT/ML injection Inject into the skin See admin instructions. 1.25 Units 12 Am-7 Am,  1.75 u/hr 7 AM  Til 12 AM, Bolus 1 unit for every 15 carbs, 1 Unit for every 50 points greater than 150 Insulin pump     levocetirizine (XYZAL) 5 MG tablet Take 5 mg by mouth daily.      metoprolol succinate (TOPROL-XL) 25 MG 24 hr tablet TAKE 1 TABLET TWICE DAILY 180 tablet 3   omeprazole (PRILOSEC) 40 MG capsule TAKE 1 CAPSULE EVERY DAY     PARoxetine (PAXIL) 20 MG tablet Take 20 mg by mouth at bedtime.      polyethylene glycol (MIRALAX / GLYCOLAX) packet Take 17 g by mouth daily as needed for moderate constipation.      Probiotic Product (PROBIOTIC-10 PO) Take 1 tablet by mouth daily.     rosuvastatin (CRESTOR) 40 MG tablet TAKE ONE TABLET BY MOUTH ONCE A DAY. STOP LIPITOR 90 tablet 3   sacubitril-valsartan (ENTRESTO) 24-26 MG Take 1 tablet by mouth 2 (two) times daily. 56 tablet 0   No current facility-administered medications for this visit.   Allergies:  Bactrim [sulfamethoxazole-trimethoprim], Codeine, Mobic [meloxicam], Morphine and related, Silvadene [silver sulfadiazine], and Adhesive [tape]   Social History: The patient  reports that she quit smoking about 9 years ago. Her smoking  use included cigarettes. She quit after 25.00 years of use. She has never used smokeless tobacco. She reports that she does not drink alcohol or use drugs.   Family History: The patient's family history includes Heart attack in her brother and mother; Heart disease in her brother and mother; Hypertension in her brother and mother.   ROS:  Please see the history of present illness. Otherwise, complete review of systems is positive for none.  All other systems are reviewed and negative.   Physical Exam: VS:  BP (!) 122/50    Pulse 76    Ht 5\' 3"  (1.6 m)    Wt 216 lb (98 kg)    SpO2 95%    BMI 38.26 kg/m , BMI Body mass index is 38.26 kg/m.  Wt Readings from Last 3 Encounters:  08/11/19 216 lb (98 kg)  07/28/19 215 lb (97.5 kg)  04/08/19 211 lb 8 oz (95.9 kg)    General: Patient appears comfortable at rest. Neck: Supple, no elevated JVP or carotid bruits, no thyromegaly. Lungs: Clear to auscultation, nonlabored breathing at rest. Cardiac: Regular rate and rhythm, no S3 or significant systolic murmur, no pericardial rub. Abdomen: Soft, nontender, no hepatomegaly, bowel sounds present, no guarding or rebound. Extremities: Mild lower extremity edema right greater than left, distal pulses 2+. Skin: Warm and dry. Neuropsychiatric: Alert and oriented x3, affect grossly appropriate.  ECG:  An ECG dated April 10, 2019 was personally reviewed today and demonstrated:  Normal sinus rhythm rate of 66, possible left atrial enlargement, ST and T wave abnormality, consider lateral ischemia.  Recent Labwork: 07/28/2019: BUN 30; Creatinine, Ser 1.63; Hemoglobin 11.7; Platelets 248; Potassium 4.7; Sodium 139; TSH 2.066 GFR 32    Component Value Date/Time   CHOL 183 08/22/2017 1448   TRIG 143 08/22/2017 1448   HDL 36 (L) 08/22/2017 1448   CHOLHDL 5.1 08/22/2017 1448   VLDL 29 08/22/2017 1448   LDLCALC 118 (H) 08/22/2017 1448    Other Studies Reviewed Today:  Echocardiogram July 29, 2019 1. Left ventricular ejection fraction, by visual estimation, is 55 to 60%. The left ventricle has normal function. There is mildly increased left ventricular hypertrophy. 2. Left ventricular diastolic parameters are indeterminate. 3. The left ventricle has no regional wall motion abnormalities. 4. Global right ventricle has normal systolic function.The right ventricular size is normal. No increase in right ventricular wall thickness. 5. Left atrial size was normal. 6. Right atrial size was normal. 7. Mild to moderate mitral annular calcification. 8. The mitral valve is grossly normal. Trivial mitral valve regurgitation. 9. The tricuspid valve is grossly normal. 10. The aortic valve was not well visualized. Aortic valve regurgitation is trivial. 11. The pulmonic valve was grossly normal. Pulmonic valve regurgitation is trivial. 12. TR signal is inadequate for assessing pulmonary artery systolic pressure. 13. The inferior vena cava is normal in size with greater than 50% respiratory variability, suggesting right atrial pressure of 3 mmHg.  Cardiac catheterization August 06, 2017 Conclusions: 1. Severe, diffuse native coronary artery disease. 2. Widely patent LIMA-LAD with minimal runoff beyond the distal anastomosis. 3. Patent but poorly visualized SVG-rPDA. Further attempts to engage SVG were not made due to declining respiratory status in the setting of severely elevated left and right heart pressures. 4. Severely elevated left and right heart filling pressures. 5. Severe pulmonary hypertension. 6. Low normal to reduced cardiac output. Recommendations: 1. Transfer to cardiac ICU for aggressive diuresis and BiPAP (if needed) for shortness of breath in the setting of acute decompensated systolic heart failure. 2. Optimize evidence-based heart failure therapy. If patient does not respond well to diuresis, consultation with advanced heart failure service will need to be  considered. 3. If patient develops chest pain or has continued dyspnea when euvolemic, repeat catheterization to better visualize SVG->rPDA could be considered. Aggressive secondary prevention of CAD.    Peripheral vascular catheterization March 06, 2018 1.  No significant aortic disease. 2.  Right lower extremity: Mild iliac disease with 30 to 40% disease in the common femoral and minor irregularities affecting the SFA and popliteal artery with three-vessel runoff below the knee. 3.  Left lower extremity: Moderate  common iliac artery calcified stenosis, mild common femoral artery disease, moderate distal SFA disease and three-vessel runoff below the knee. Recommendations: There is no critical stenosis to explain  the patient's claudication which is likely multifactorial including chronic systolic heart failure.  Recommend continuing medical therapy.  Assessment and Plan:  1. Chronic combined systolic and diastolic CHF (congestive heart failure) (HCC)   2. Coronary artery disease involving native coronary artery of native heart without angina pectoris   3. Palpitations   4. PVD (peripheral vascular disease) (HCC)    1. Chronic combined systolic and diastolic CHF (congestive heart failure) (HCC) Recent echocardiogram shows improved ejection fraction 55 to 60%. 40 to 45% on previous echo.    2. Coronary artery disease involving native coronary artery of native heart without angina pectoris History of coronary artery disease with bypass 2014.  LIMA to LAD, SVG to RPDA.  Last cardiac catheterization in January 2019 showed severe diffuse coronary artery disease.   3. Palpitations Patient recently had an event monitor placed and removed it today.  Results are pending.  Patient has been complaining of palpitations occurring in the evening and during exertional activity described as a fluttering sensation.  Patient states she can feel the fluttering sensation in her neck and sometimes hear the  heartbeat.  She has had previous event monitor in the past which she states showed no significant arrhythmias.  4. PVD (peripheral vascular disease) (HCC) Right lower extremity: Mild iliac disease with 30 to 40% disease in the common femoral and minor irregularities affecting the SFA and popliteal artery with three-vessel runoff below the knee. Left lower extremity: Moderate  common iliac artery calcified stenosis, mild common femoral artery disease, moderate distal SFA disease and three-vessel runoff below the knee. Recommendations were to continue medical therapy.  Patient currently denies any significant claudication symptoms.  Complains of some tingling in her legs.  Medication Adjustments/Labs and Tests Ordered: Current medicines are reviewed at length with the patient today.  Concerns regarding medicines are outlined above.    Patient Instructions  Medication Instructions:   Your physician recommends that you continue on your current medications as directed. Please refer to the Current Medication list given to you today.  Labwork:  NONE  Testing/Procedures:  NONE  Follow-Up:  Your physician recommends that you schedule a follow-up appointment in: 2 months (office)  Any Other Special Instructions Will Be Listed Below (If Applicable).  If you need a refill on your cardiac medications before your next appointment, please call your pharmacy.        Signed, Rennis Harding, NP 08/11/2019 4:10 PM    [facility] at [facility] [address] [place], [place], [place] [address] Phone: [phone]; Fax: [phone]

## 2019-08-14 ENCOUNTER — Other Ambulatory Visit: Payer: Self-pay

## 2019-08-14 DIAGNOSIS — R000 Tachycardia, unspecified: Secondary | ICD-10-CM

## 2019-10-06 ENCOUNTER — Telehealth: Payer: Self-pay | Admitting: *Deleted

## 2019-10-06 NOTE — Telephone Encounter (Signed)
Received fax from [employer] - Sherryll Burger approved for assistance for the remainder of the year.

## 2019-10-10 ENCOUNTER — Ambulatory Visit (INDEPENDENT_AMBULATORY_CARE_PROVIDER_SITE_OTHER): Payer: Medicare PPO | Admitting: Cardiovascular Disease

## 2019-10-10 ENCOUNTER — Other Ambulatory Visit: Payer: Self-pay

## 2019-10-10 ENCOUNTER — Encounter: Payer: Self-pay | Admitting: Cardiovascular Disease

## 2019-10-10 VITALS — BP 120/70 | HR 72 | Ht 63.0 in | Wt 213.4 lb

## 2019-10-10 DIAGNOSIS — E785 Hyperlipidemia, unspecified: Secondary | ICD-10-CM | POA: Diagnosis not present

## 2019-10-10 DIAGNOSIS — I25708 Atherosclerosis of coronary artery bypass graft(s), unspecified, with other forms of angina pectoris: Secondary | ICD-10-CM

## 2019-10-10 DIAGNOSIS — I6523 Occlusion and stenosis of bilateral carotid arteries: Secondary | ICD-10-CM | POA: Diagnosis not present

## 2019-10-10 DIAGNOSIS — I959 Hypotension, unspecified: Secondary | ICD-10-CM

## 2019-10-10 DIAGNOSIS — I739 Peripheral vascular disease, unspecified: Secondary | ICD-10-CM

## 2019-10-10 DIAGNOSIS — N183 Chronic kidney disease, stage 3 unspecified: Secondary | ICD-10-CM

## 2019-10-10 MED ORDER — FUROSEMIDE 40 MG PO TABS: 40.0000 mg | ORAL_TABLET | Freq: Every day | ORAL | Status: DC

## 2019-10-10 NOTE — Progress Notes (Signed)
SUBJECTIVE: The patient presents for routine follow-up.  She has coronary artery disease, chronic systolic heart failure, and peripheral arterial disease.  She has mild to moderate nonobstructive peripheral arterial disease and was last evaluated by Dr. Kirke Corin in September 2020.  He felt that his symptoms were more suggestive of possible spinal stenosis.  Echocardiogram on 07/29/2019 demonstrated normalization of LV systolic function, LVEF 55 to 76%, mild concentric LVH, and normal regional wall motion.  She wore an event monitor for palpitations in January 2021 but I am unable to locate the results.  She is doing well and denies chest pain, palpitations, leg swelling, and shortness of breath.  She said she has not been exercising and has been snacking a lot more.   Social history: Her daughter,Holly, isa former employee with [employer].  She now works for [employer] in [place].  Review of Systems: As per "subjective", otherwise negative.  Allergies  Allergen Reactions  . Bactrim [Sulfamethoxazole-Trimethoprim] Rash  . Codeine Nausea And Vomiting  . Mobic [Meloxicam]     swelling  . Morphine And Related Nausea Only and Other (See Comments)    confusion  . Silvadene [Silver Sulfadiazine]     Red hot rash and blisters  . Adhesive [Tape] Rash    Band aid brand  laxex    Current Outpatient Medications  Medication Sig Dispense Refill  . albuterol (PROAIR HFA) 108 (90 BASE) MCG/ACT inhaler Inhale 2 puffs into the lungs every 4 (four) hours as needed.     Marland Kitchen aspirin EC 81 MG tablet Take 81 mg by mouth daily.     . Cholecalciferol (VITAMIN D) 2000 units tablet Take 2,000 Units by mouth daily.    . Coenzyme Q10 (COQ10) 100 MG CAPS Take 100 mg by mouth daily.     Marland Kitchen ezetimibe (ZETIA) 10 MG tablet Take 1 tablet (10 mg total) by mouth daily. 90 tablet 3  . fluticasone (FLONASE) 50 MCG/ACT nasal spray Place 2 sprays into both nostrils daily as needed for  allergies or rhinitis.    . furosemide (LASIX) 40 MG tablet Take 1 tablet (40 mg total) by mouth 2 (two) times daily. 180 tablet 3  . gabapentin (NEURONTIN) 800 MG tablet Take 800 mg by mouth 3 (three) times daily.     . insulin aspart (NOVOLOG) 100 UNIT/ML injection Inject into the skin See admin instructions. 1.25 Units 12 Am-7 Am,  1.75 u/hr 7 AM  Til 12 AM, Bolus 1 unit for every 15 carbs, 1 Unit for every 50 points greater than 150 Insulin pump    . levocetirizine (XYZAL) 5 MG tablet Take 5 mg by mouth daily.     . metoprolol succinate (TOPROL-XL) 25 MG 24 hr tablet TAKE 1 TABLET TWICE DAILY 180 tablet 3  . omeprazole (PRILOSEC) 40 MG capsule TAKE 1 CAPSULE EVERY DAY    . PARoxetine (PAXIL) 20 MG tablet Take 20 mg by mouth at bedtime.     . polyethylene glycol (MIRALAX / GLYCOLAX) packet Take 17 g by mouth daily as needed for moderate constipation.     . Probiotic Product (PROBIOTIC-10 PO) Take 1 tablet by mouth daily.    . rosuvastatin (CRESTOR) 40 MG tablet TAKE ONE TABLET BY MOUTH ONCE A DAY. STOP LIPITOR 90 tablet 3  . sacubitril-valsartan (ENTRESTO) 24-26 MG Take 1 tablet by mouth 2 (two) times daily. 56 tablet 0   No current facility-administered medications for this visit.    Past Medical History:  Diagnosis Date  . Anemia   . Anxiety   . Arthritis   . Carotid artery disease ([place])    a. Last carotid study in 2014 showed 5-42% RICA, 70-62% LICA.  Marland Kitchen Chronic combined systolic (congestive) and diastolic (congestive) heart failure (HCC)    a. EF 40-45% in 2014. b. EF 30-35% in 07/2017.  . CKD (chronic kidney disease), stage III   . Coronary artery disease    a. s/p CABG 2014. b. Cath 07/2017 for NSTEMI -> med rx.  . Diabetes mellitus ([place])   . Diabetic peripheral neuropathy ([place])   . Eczema   . GERD (gastroesophageal reflux disease)   . H/O hiatal hernia   . Headache(784.0)    allergies, sinus  . Hyperlipidemia   . Hypertension   . Myocardial infarction ([place])   . PONV  (postoperative nausea and vomiting)   . Sleep apnea   . Umbilical hernia     Past Surgical History:  Procedure Laterality Date  . ABDOMINAL AORTOGRAM W/LOWER EXTREMITY N/A 03/06/2018   Procedure: ABDOMINAL AORTOGRAM W/LOWER EXTREMITY;  Surgeon: Wellington Hampshire, MD;  Location: [place] [facility];  Service: Cardiovascular;  Laterality: N/A;  . ABDOMINAL HYSTERECTOMY    . 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 CARDIAC CATHETERIZATION    . CARPAL TUNNEL RELEASE    . CESAREAN SECTION  1983  . CORONARY ARTERY BYPASS GRAFT N/A 04/11/2013   Procedure: CORONARY ARTERY BYPASS GRAFTING (CABG);  Surgeon: Gaye Pollack, MD;  Location: [place];  Service: Open Heart Surgery;  Laterality: N/A;  . EYE SURGERY Bilateral    laser  . FRACTURE SURGERY Left 2005  . RIGHT/LEFT HEART CATH AND CORONARY/GRAFT ANGIOGRAPHY N/A 08/06/2017   Procedure: RIGHT/LEFT HEART CATH AND CORONARY/GRAFT ANGIOGRAPHY;  Surgeon: Nelva Bush, MD;  Location: [place] [facility];  Service: Cardiovascular;  Laterality: N/A;    Social History   Socioeconomic History  . Marital status: Married    Spouse name: Not on file  . Number of children: Not on file  . Years of education: Not on file  . Highest education level: Not on file  Occupational History  . Not on file  Tobacco Use  . Smoking status: Former Smoker    Years: 25.00    Types: Cigarettes    Quit date: 05/30/2010    Years since quitting: 9.3  . Smokeless tobacco: Never Used  . Tobacco comment: STOPPED 2007  Substance and Sexual Activity  . Alcohol use: No  . Drug use: No  . Sexual activity: Not on file  Other Topics Concern  . Not on file  Social History Narrative  . Not on file   Social Determinants of Health   Financial Resource Strain:   . Difficulty of Paying Living Expenses:   Food Insecurity:   . Worried About Charity fundraiser in the Last Year:   . Arboriculturist in the Last Year:   Transportation Needs:   . Film/video editor  (Medical):   Marland Kitchen Lack of Transportation (Non-Medical):   Physical Activity:   . Days of Exercise per Week:   . Minutes of Exercise per Session:   Stress:   . Feeling of Stress :   Social Connections:   . Frequency of Communication with Friends and Family:   . Frequency of Social Gatherings with Friends and Family:   . Attends Religious Services:   . Active Member of Clubs or Organizations:   .  Attends Banker Meetings:   Marland Kitchen Marital Status:   Intimate Partner Violence:   . Fear of Current or Ex-Partner:   . Emotionally Abused:   Marland Kitchen Physically Abused:   . Sexually Abused:     Garnet Sierras, LPN was present throughout the entirety of the encounter.  Vitals:   10/10/19 1253 10/10/19 1303 10/10/19 1305  BP: (!) 90/50 (!) 90/54 120/70  Pulse: 72    SpO2: 99%    Weight: 213 lb 6.4 oz (96.8 kg)    Height: 5\' 3"  (1.6 m)      Wt Readings from Last 3 Encounters:  10/10/19 213 lb 6.4 oz (96.8 kg)  08/11/19 216 lb (98 kg)  07/28/19 215 lb (97.5 kg)     PHYSICAL EXAM General: NAD HEENT: Normal. Neck: No JVD, no thyromegaly. Lungs: Clear to auscultation bilaterally with normal respiratory effort. CV: Regular rate and rhythm, normal S1/S2, no S3/S4, no murmur. No pretibial or periankle edema.  No carotid bruit.   Abdomen: Soft, nontender, no distention.  Neurologic: Alert and oriented.  Psych: Normal affect. Skin: Normal. Musculoskeletal: No gross deformities.      Labs: Lab Results  Component Value Date/Time   K 4.7 07/28/2019 02:48 PM   BUN 30 (H) 07/28/2019 02:48 PM   BUN 40 (H) 02/19/2018 11:52 AM   CREATININE 1.63 (H) 07/28/2019 02:48 PM   ALT 20 08/22/2017 02:47 PM   TSH 2.066 07/28/2019 02:49 PM   HGB 11.7 (L) 07/28/2019 02:48 PM   HGB 10.3 (L) 02/19/2018 11:52 AM     Lipids: Lab Results  Component Value Date/Time   LDLCALC 118 (H) 08/22/2017 02:48 PM   CHOL 183 08/22/2017 02:48 PM   TRIG 143 08/22/2017 02:48 PM   HDL 36 (L) 08/22/2017 02:48 PM        ASSESSMENT AND PLAN:  1. Coronary disease with history of CABG: Symptomatically stable. Continue aspirin, Toprol-XL 25 mg twice daily, andCrestor 40 mg.   2. Chronic diastolic heart failure: Echocardiogram in December 2020 reviewed above demonstrated normalization of LV systolic function, LVEF now 55 to 60%.I will continue Toprol-XL 25 mg twice daily.  As she is hypotensive, I will stop Entresto.  Continue Lasix 40 mg daily.  3. Chronic kidney disease stage January 2021 reviewed above.  4. Carotid artery disease: Mild bilateral carotid artery disease with stenoses of less than 50%. Continue aspirin and statin. I may consider repeating next year.  5. Hyperlipidemia: LDLdown to 48 on 09/26/2019.Continue Crestor 40 mg.  6. Hypertension: Blood pressure islow.  I will stop Entresto.  7.   Peripheral arterial disease: Follows with Dr. 09/28/2019.    She has mild to moderate nonobstructive disease.  Continue aspirin and statin.   Disposition: Follow up 6 months virtual visit   Kirke Corin, M.D., F.A.C.C.

## 2019-10-10 NOTE — Patient Instructions (Addendum)
Medication Instructions:   Stop Entresto.   Continue all other medications.    Labwork: none  Testing/Procedures: none  Follow-Up: 6 months   Any Other Special Instructions Will Be Listed Below (If Applicable).  If you need a refill on your cardiac medications before your next appointment, please call your pharmacy.

## 2019-12-07 IMAGING — US US CAROTID DUPLEX BILAT
1 series · 13 of 24 positions shown · non-contrast
Comparison: None.

CLINICAL DATA: Asymptomatic carotid bruits, carotid atherosclerosis

EXAM:
BILATERAL CAROTID DUPLEX ULTRASOUND
TECHNIQUE: Gray scale imaging, color Doppler and duplex ultrasound were
performed of bilateral carotid and vertebral arteries in the neck.

[Series 1: us carotid duplex bilat · 0.06mm/px · 13 of 68 slices shown]
[im 1/68]
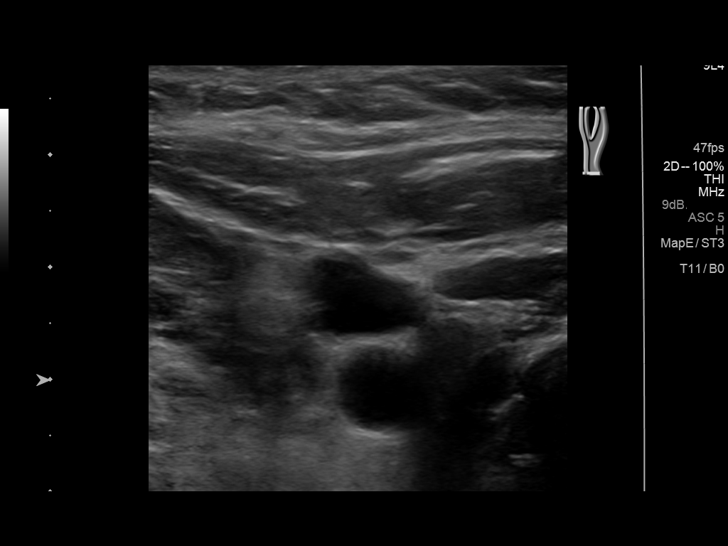
[im 6/68]
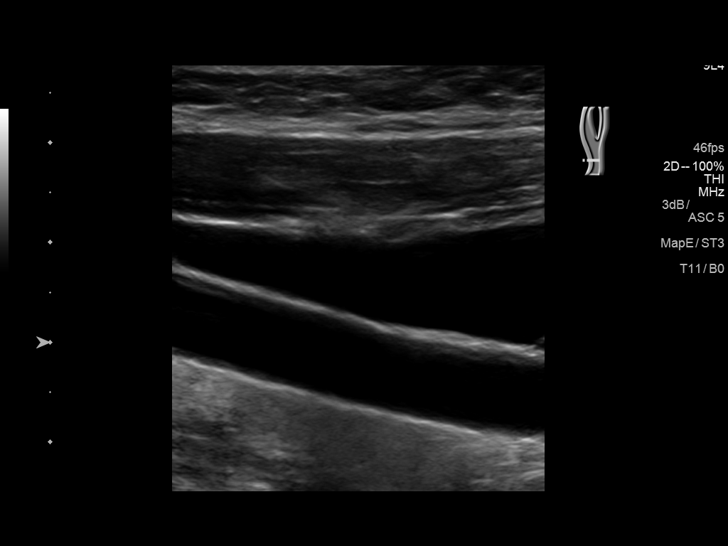
[im 12/68]
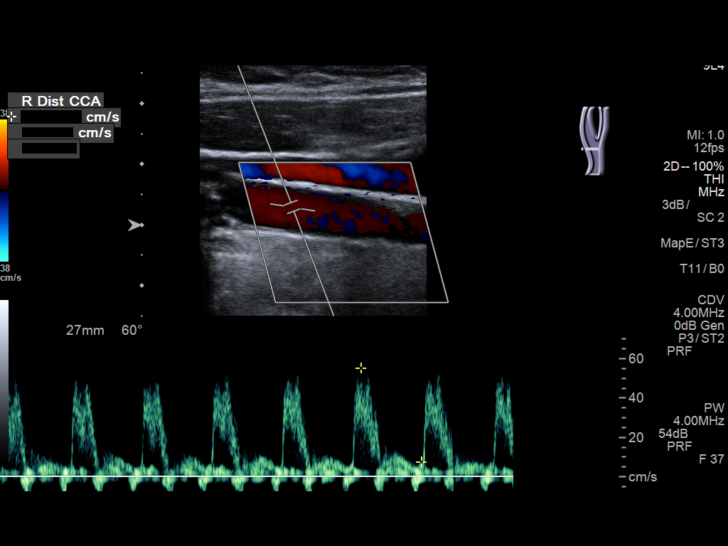
[im 18/68]
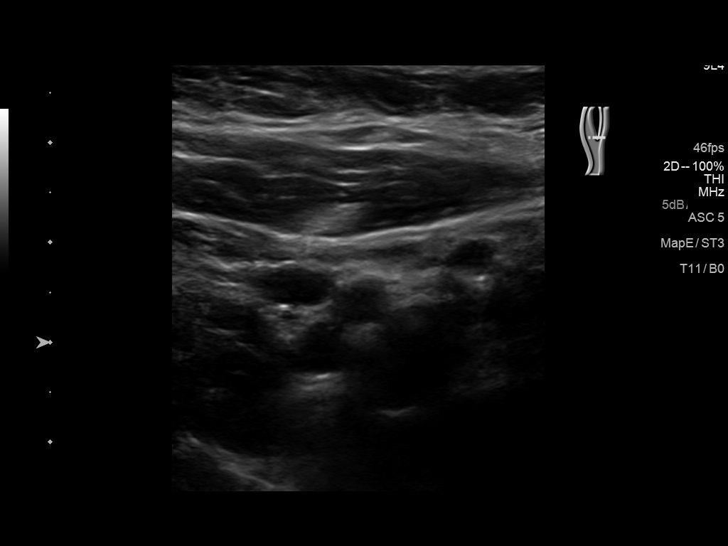
[im 24/68]
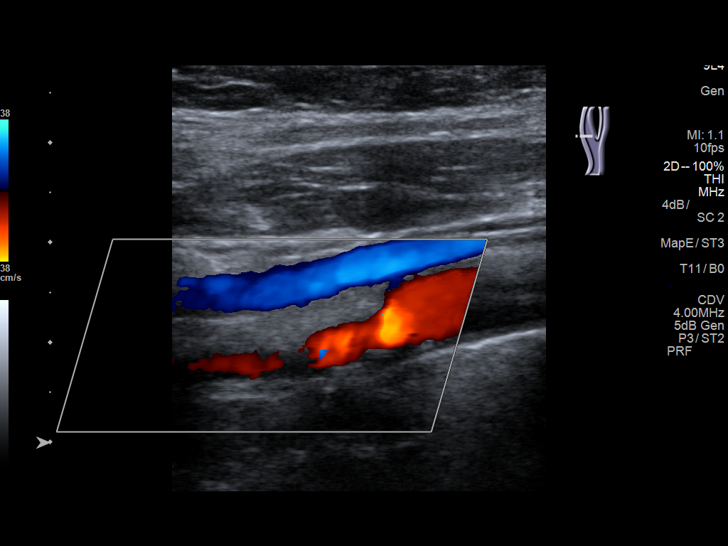
[im 30/68]
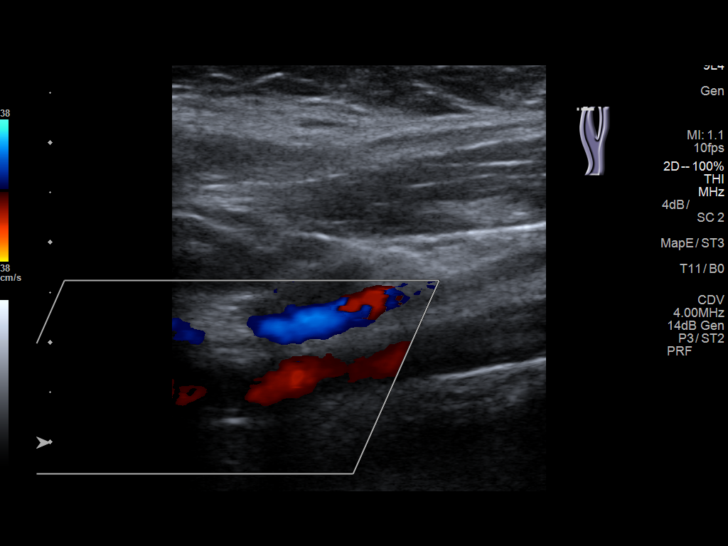
[im 35/68]
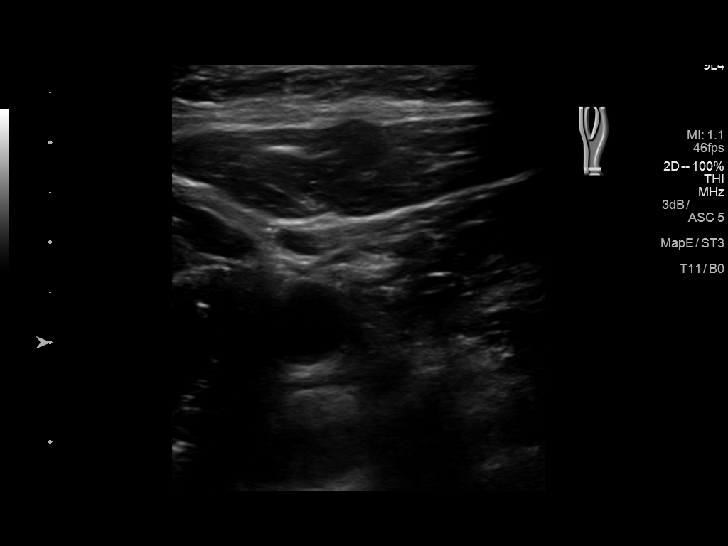
[im 38/68]
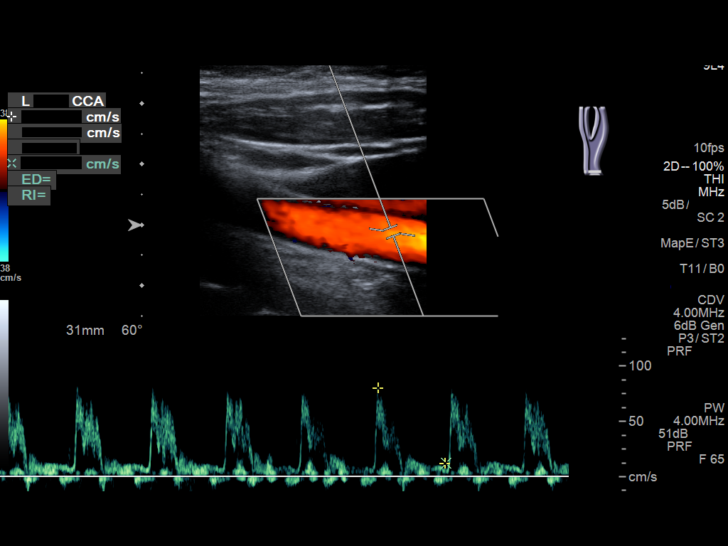
[im 44/68]
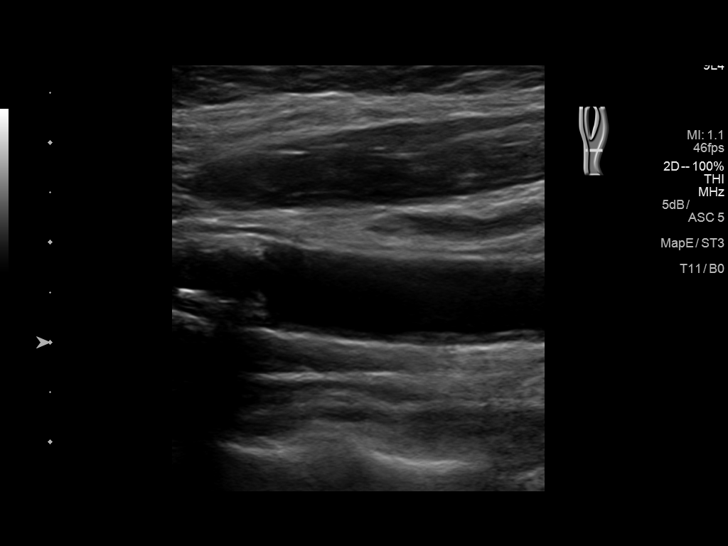
[im 50/68]
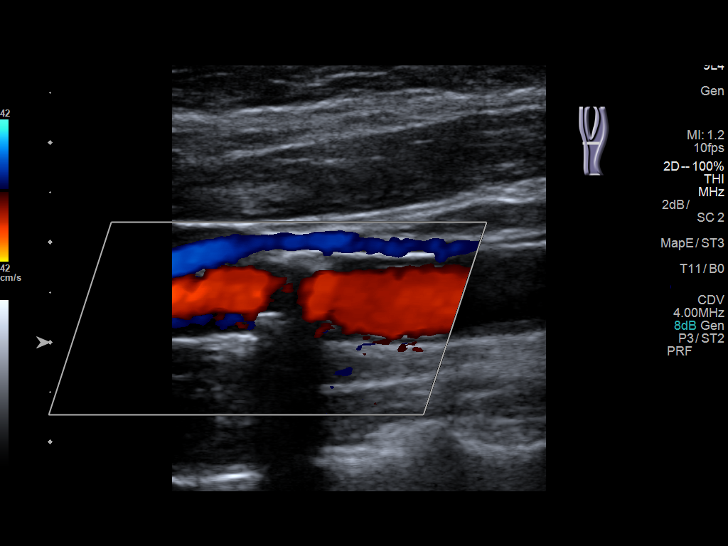
[im 56/68]
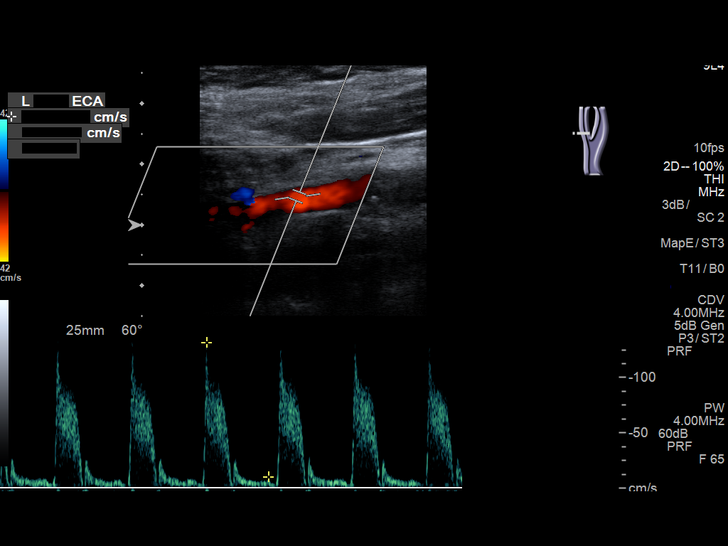
[im 62/68]
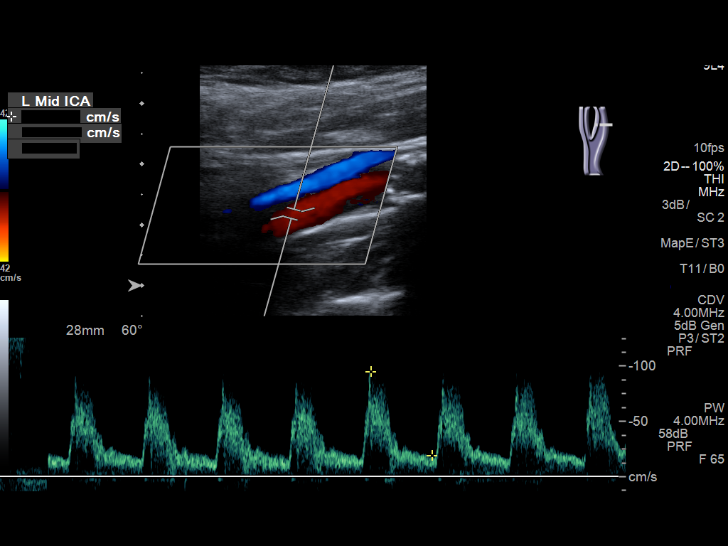
[im 68/68]
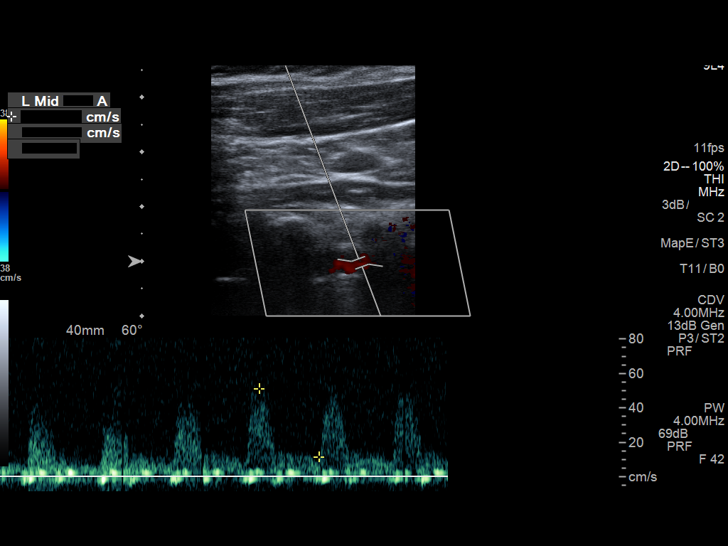

[13 of 24 positions shown; findings below may reference images not displayed]

FINDINGS: Criteria: Quantification of carotid stenosis is based on velocity
parameters that correlate the residual internal carotid diameter
with NASCET-based stenosis levels, using the diameter of the distal
internal carotid lumen as the denominator for stenosis measurement.

The following velocity measurements were obtained:

RIGHT

ICA:  122/28 cm/sec

CCA:  75/13 cm/sec

SYSTOLIC ICA/CCA RATIO:

DIASTOLIC ICA/CCA RATIO:

ECA:  144 cm/sec

LEFT

ICA:  98/21 cm/sec

CCA:  61/9 cm/sec

SYSTOLIC ICA/CCA RATIO:

DIASTOLIC ICA/CCA RATIO:

ECA:  132 cm/sec

RIGHT CAROTID ARTERY: Minor echogenic shadowing plaque formation. No
hemodynamically significant right ICA stenosis, velocity elevation,
or turbulent flow. Degree of narrowing less than 50%.

RIGHT VERTEBRAL ARTERY:  Antegrade

LEFT CAROTID ARTERY: Mild heterogeneous partially calcified left
carotid bifurcation atherosclerosis. Despite this, there is no
hemodynamically significant left ICA stenosis, velocity elevation,
or turbulent flow. Degree of narrowing also less than 50%.

LEFT VERTEBRAL ARTERY:  Antegrade
IMPRESSION: Mild bilateral carotid atherosclerosis. No hemodynamically
significant ICA stenosis. Degree of narrowing less than 50%
bilaterally by ultrasound criteria.

Patent antegrade vertebral flow bilaterally

## 2019-12-20 IMAGING — DX DG CHEST 1V PORT
1 series · 1 of 1 positions shown · non-contrast
Comparison: 08/02/2017

CLINICAL DATA: Dyspnea

EXAM:
PORTABLE CHEST 1 VIEW

[chest ap]
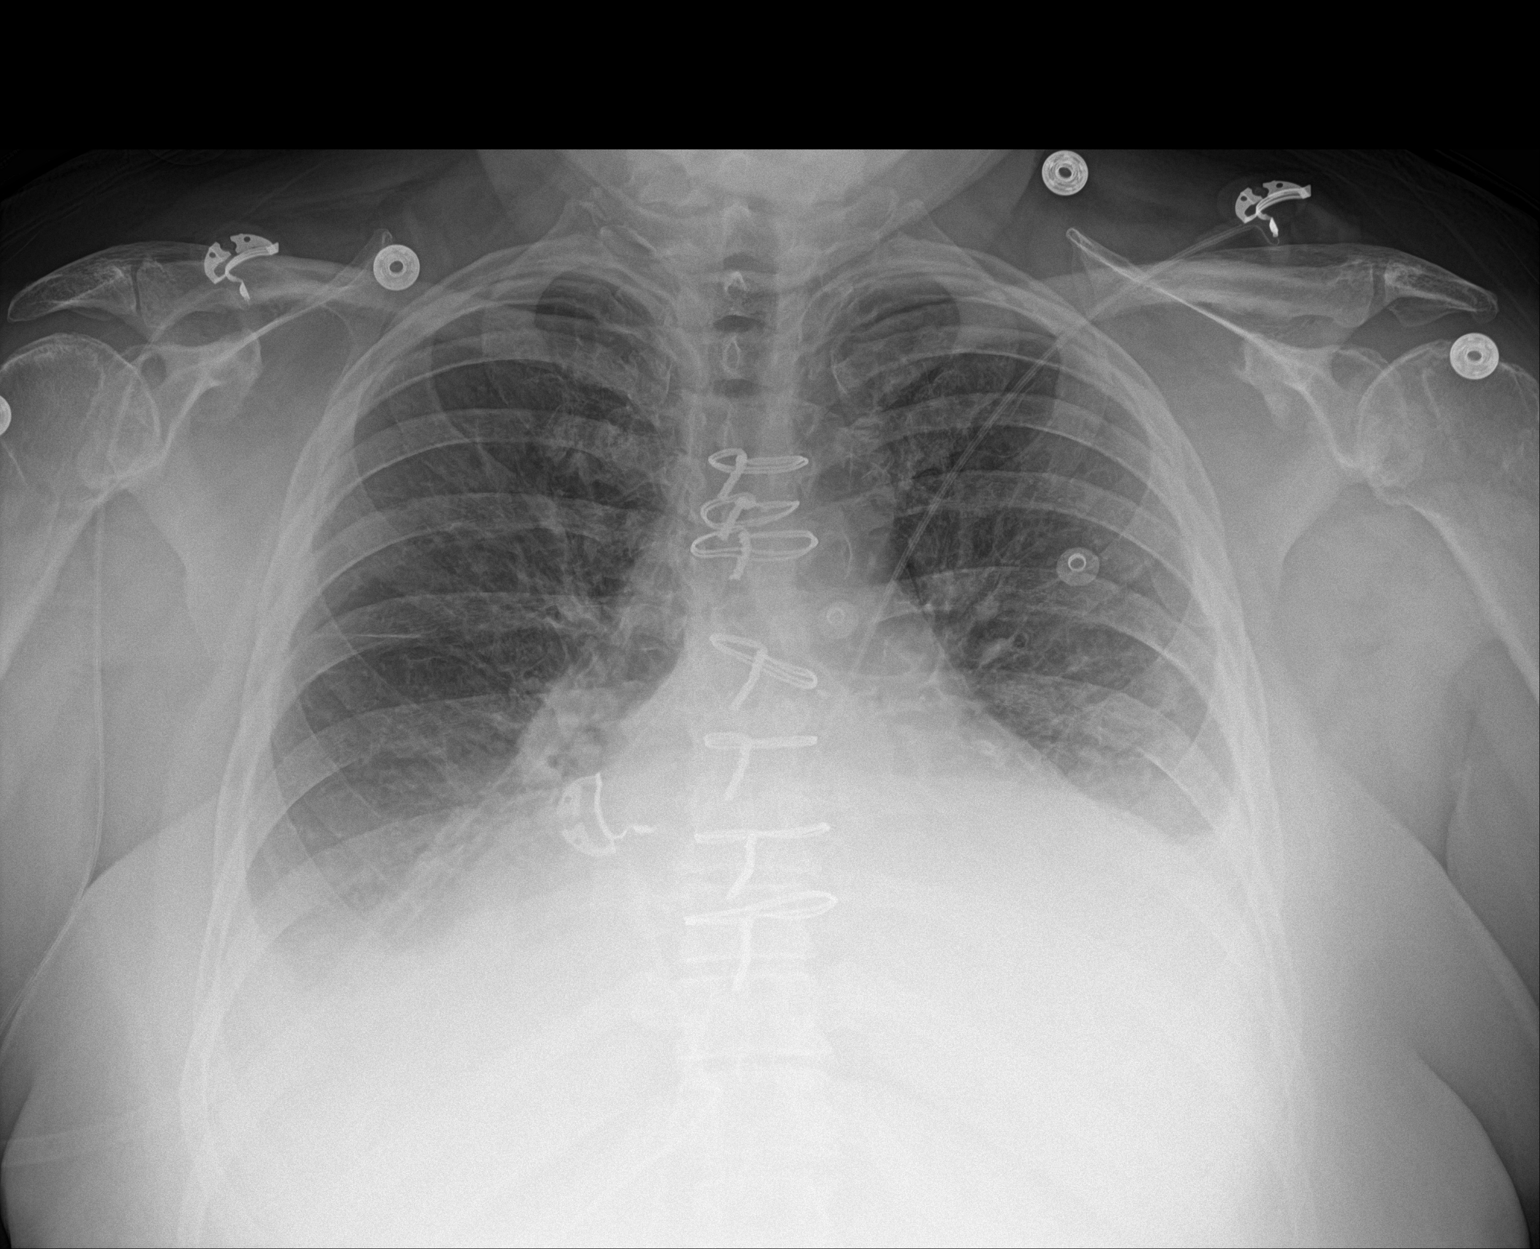

[1 of 1 positions shown; findings below may reference images not displayed]

FINDINGS: Cardiomegaly with pulmonary vascular congestion and suspected mild
interstitial edema, although improved.

Small to moderate bilateral pleural effusions, left greater than
right.

Postsurgical changes related to prior CABG.  Median sternotomy.
IMPRESSION: Cardiomegaly with mild interstitial edema, improved.

Small to moderate bilateral pleural effusions, left greater than
right.

## 2020-03-25 ENCOUNTER — Encounter: Payer: Self-pay | Admitting: Cardiology

## 2020-03-25 DIAGNOSIS — I5043 Acute on chronic combined systolic (congestive) and diastolic (congestive) heart failure: Secondary | ICD-10-CM | POA: Insufficient documentation

## 2020-03-25 DIAGNOSIS — I2510 Atherosclerotic heart disease of native coronary artery without angina pectoris: Secondary | ICD-10-CM | POA: Insufficient documentation

## 2020-03-25 DIAGNOSIS — I779 Disorder of arteries and arterioles, unspecified: Secondary | ICD-10-CM | POA: Insufficient documentation

## 2020-03-25 DIAGNOSIS — Z7189 Other specified counseling: Secondary | ICD-10-CM | POA: Insufficient documentation

## 2020-03-25 DIAGNOSIS — I25810 Atherosclerosis of coronary artery bypass graft(s) without angina pectoris: Secondary | ICD-10-CM | POA: Insufficient documentation

## 2020-03-25 DIAGNOSIS — N1831 Chronic kidney disease, stage 3a: Secondary | ICD-10-CM | POA: Insufficient documentation

## 2020-03-25 DIAGNOSIS — I5042 Chronic combined systolic (congestive) and diastolic (congestive) heart failure: Secondary | ICD-10-CM | POA: Insufficient documentation

## 2020-03-25 NOTE — Progress Notes (Signed)
Cardiology Office Note   Date:  03/26/2020   ID:  Kiara [name], DOB [birth year], MRN [id number]  PCP:  Majel Homer, MD  Cardiologist:   Prentice Docker, MD (Inactive)   Chief Complaint  Patient presents with  . Congestive Heart Failure      History of Present Illness: Kiara [name] is a 69 y.o. female who presents for  routine follow-up of CAD.  Echocardiogram on 07/29/2019 demonstrated normalization of LV systolic function, LVEF 55 to 94%, mild concentric LVH, and normal regional wall motion.  At the last visit she was hypotensive and Entresto was discontinued.     Since she was last seen she has been taking care of her mom who was 83.  She has not been taking care of herself very well.  Her mom died on Dec 12, 2022.  The patient just had blood work done at her primary care office and I did see an A1c that was 14.1.  The patient has not been bolusing her insulin as suggested.  However, she did not have any other meds adjusted.  The patient says she is not particularly following her diet as she should.  She has not been getting any exercise because she was taking care of her mom.  She denies any chest pressure, neck or arm discomfort.  She has had no new shortness of breath, PND or orthopnea.  She said no palpitations, presyncope or syncope.  She has been fatigued.   Past Medical History:  Diagnosis Date  . Anemia   . Anxiety   . Arthritis   . Carotid artery disease (HCC)    a. Last carotid study in 2014 showed 1-39% RICA, 40-59% LICA.  Marland Kitchen Chronic combined systolic (congestive) and diastolic (congestive) heart failure (HCC)    a. EF 40-45% in 2014. b. EF 30-35% in 07/2017.  . CKD (chronic kidney disease), stage III   . Coronary artery disease    a. s/p CABG 2014. b. Cath 07/2017 for NSTEMI -> med rx.  . Diabetes mellitus (HCC)   . Diabetic peripheral neuropathy (HCC)   . Eczema   . GERD (gastroesophageal reflux disease)   . H/O hiatal hernia   . Hyperlipidemia   .  Hypertension   . Myocardial infarction (HCC)   . PONV (postoperative nausea and vomiting)   . Sleep apnea   . Umbilical hernia     Past Surgical History:  Procedure Laterality Date  . ABDOMINAL AORTOGRAM W/LOWER EXTREMITY N/A 03/06/2018   Procedure: ABDOMINAL AORTOGRAM W/LOWER EXTREMITY;  Surgeon: Iran Ouch, MD;  Location: [facility];  Service: Cardiovascular;  Laterality: N/A;  . ABDOMINAL HYSTERECTOMY    . 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 CARDIAC CATHETERIZATION    . CARPAL TUNNEL RELEASE    . CESAREAN SECTION  1983  . CORONARY ARTERY BYPASS GRAFT N/A 04/11/2013   Procedure: CORONARY ARTERY BYPASS GRAFTING (CABG);  Surgeon: Alleen Borne, MD;  Location: [facility];  Service: Open Heart Surgery;  Laterality: N/A;  . EYE SURGERY Bilateral    laser  . FRACTURE SURGERY Left 2005  . RIGHT/LEFT HEART CATH AND CORONARY/GRAFT ANGIOGRAPHY N/A 08/06/2017   Procedure: RIGHT/LEFT HEART CATH AND CORONARY/GRAFT ANGIOGRAPHY;  Surgeon: Yvonne Kendall, MD;  Location: [facility];  Service: Cardiovascular;  Laterality: N/A;     Current Outpatient Medications  Medication Sig Dispense Refill  . albuterol (PROAIR HFA) 108 (90 BASE) MCG/ACT inhaler Inhale 2 puffs into the  lungs every 4 (four) hours as needed.     Marland Kitchen aspirin EC 81 MG tablet Take 81 mg by mouth daily.     . Cholecalciferol (VITAMIN D) 2000 units tablet Take 2,000 Units by mouth daily.    . Coenzyme Q10 (COQ10) 100 MG CAPS Take 100 mg by mouth daily.     Marland Kitchen ezetimibe (ZETIA) 10 MG tablet Take 1 tablet (10 mg total) by mouth daily. 90 tablet 3  . fluticasone (FLONASE) 50 MCG/ACT nasal spray Place 2 sprays into both nostrils daily as needed for allergies or rhinitis.    . furosemide (LASIX) 40 MG tablet Take 1 tablet (40 mg total) by mouth daily.    Marland Kitchen gabapentin (NEURONTIN) 800 MG tablet Take 800 mg by mouth 3 (three) times daily.     . insulin aspart (NOVOLOG) 100 UNIT/ML injection Inject into the skin See admin  instructions. 1.25 Units 12 Am-7 Am,  1.75 u/hr 7 AM  Til 12 AM, Bolus 1 unit for every 15 carbs, 1 Unit for every 50 points greater than 150 Insulin pump    . levocetirizine (XYZAL) 5 MG tablet Take 5 mg by mouth daily.     . metoprolol succinate (TOPROL-XL) 25 MG 24 hr tablet TAKE 1 TABLET TWICE DAILY 180 tablet 3  . omeprazole (PRILOSEC) 40 MG capsule TAKE 1 CAPSULE EVERY DAY    . PARoxetine (PAXIL) 20 MG tablet Take 20 mg by mouth at bedtime.     . polyethylene glycol (MIRALAX / GLYCOLAX) packet Take 17 g by mouth daily as needed for moderate constipation.     . Probiotic Product (PROBIOTIC-10 PO) Take 1 tablet by mouth daily.    . rosuvastatin (CRESTOR) 40 MG tablet TAKE ONE TABLET BY MOUTH ONCE A DAY. STOP LIPITOR 90 tablet 3   No current facility-administered medications for this visit.    Allergies:   Bactrim [sulfamethoxazole-trimethoprim], Codeine, Mobic [meloxicam], Morphine and related, Silvadene [silver sulfadiazine], and Adhesive [tape]    ROS:  Please see the history of present illness.   Otherwise, review of systems are positive for none.   All other systems are reviewed and negative.    PHYSICAL EXAM: VS:  BP (!) 118/58   Pulse 74   Ht 5\' 3"  (1.6 m)   Wt 209 lb 9.6 oz (95.1 kg)   SpO2 98%   BMI 37.13 kg/m  , BMI Body mass index is 37.13 kg/m. GENERAL:  Well appearing NECK:  No jugular venous distention, waveform within normal limits, carotid upstroke brisk and symmetric, no bruits, no thyromegaly LUNGS:  Clear to auscultation bilaterally BACK:  No CVA tenderness CHEST:  Well healed sternotomy scar. HEART:  PMI not displaced or sustained,S1 and S2 within normal limits, no S3, no S4, no clicks, no rubs, no murmurs ABD:  Flat, positive bowel sounds normal in frequency in pitch, no bruits, no rebound, no guarding, no midline pulsatile mass, no hepatomegaly, no splenomegaly EXT:  2 plus pulses upper, decreased dorsalis pedis and posterior tibialis bilateral, no edema,  no cyanosis no clubbing, absent fifth and fourth toe on the right foot    EKG:  EKG is not ordered today.   Recent Labs: 07/28/2019: BUN 30; Creatinine, Ser 1.63; Hemoglobin 11.7; Platelets 248; Potassium 4.7; Sodium 139; TSH 2.066    Lipid Panel    Component Value Date/Time   CHOL 183 08/22/2017 1448   TRIG 143 08/22/2017 1448   HDL 36 (L) 08/22/2017 1448   CHOLHDL 5.1 08/22/2017 1448  VLDL 29 08/22/2017 1448   LDLCALC 118 (H) 08/22/2017 1448      Wt Readings from Last 3 Encounters:  03/26/20 209 lb 9.6 oz (95.1 kg)  10/10/19 213 lb 6.4 oz (96.8 kg)  08/11/19 216 lb (98 kg)    Diagnostic Dominance: Right   Other studies Reviewed: Additional studies/ records that were reviewed today include: Labs. Review of the above records demonstrates:  Please see elsewhere in the note.     ASSESSMENT AND PLAN:  Coronary disease with history of CABG:    She has no symptoms.  She needs aggressive risk reduction.  No change in therapy.  Chronic diastolic heart failure:  She seems to be euvolemic.  We talked about salt and fluid restriction.  She was told to hydrate because of her diabetes and I asked her not to.  We talked instead about aggressive diet and potential follow-up for med changes.  See below.   DM:  14.1 A1C.  I will defer to Majel Homer, MD to consider SGLT2 inhibitor or GLP-1 receptor antagonist.  Chronic kidney disease stage III:  Creatinine was 1.63 most recently.  This is stable.   Carotid artery disease:   I will be checking carotid Doppler.  Hyperlipidemia:   She will let me know what this is.  I cannot access the most recent readings.  Hypertension:   The blood pressure is at target.  It was low when she did not tolerate Entresto.  No change in therapy.  Peripheral arterial disease: Follows with Dr. Kirke Corin.   She has mild to moderate nonobstructive disease.    Continue current risk reduction.    Covid education: She has been vaccinated.   Current  medicines are reviewed at length with the patient today.  The patient does not have concerns regarding medicines.  The following changes have been made:  no change  Labs/ tests ordered today include: None  Orders Placed This Encounter  Procedures  . VAS US CAROTID     Disposition:   FU with me in 4 months.     Signed, Rollene Rotunda, MD  03/26/2020 4:54 PM     [facility]

## 2020-03-26 ENCOUNTER — Ambulatory Visit: Payer: Medicare PPO | Admitting: Cardiology

## 2020-03-26 ENCOUNTER — Encounter: Payer: Self-pay | Admitting: Cardiology

## 2020-03-26 VITALS — BP 118/58 | HR 74 | Ht 63.0 in | Wt 209.6 lb

## 2020-03-26 DIAGNOSIS — I779 Disorder of arteries and arterioles, unspecified: Secondary | ICD-10-CM

## 2020-03-26 DIAGNOSIS — I2510 Atherosclerotic heart disease of native coronary artery without angina pectoris: Secondary | ICD-10-CM

## 2020-03-26 DIAGNOSIS — I10 Essential (primary) hypertension: Secondary | ICD-10-CM

## 2020-03-26 DIAGNOSIS — N1831 Chronic kidney disease, stage 3a: Secondary | ICD-10-CM

## 2020-03-26 DIAGNOSIS — I5042 Chronic combined systolic (congestive) and diastolic (congestive) heart failure: Secondary | ICD-10-CM

## 2020-03-26 DIAGNOSIS — E785 Hyperlipidemia, unspecified: Secondary | ICD-10-CM

## 2020-03-26 DIAGNOSIS — Z7189 Other specified counseling: Secondary | ICD-10-CM

## 2020-03-26 NOTE — Patient Instructions (Signed)
Your physician recommends that you schedule a follow-up appointment in: 4 MONTHS WITH DR [facility]  Your physician recommends that you continue on your current medications as directed. Please refer to the Current Medication list given to you today.  Your physician has requested that you have a carotid duplex. This test is an ultrasound of the carotid arteries in your neck. It looks at blood flow through these arteries that supply the brain with blood. Allow one hour for this exam. There are no restrictions or special instructions.  Thank you for choosing [facility]!!

## 2020-04-07 ENCOUNTER — Other Ambulatory Visit: Payer: Self-pay

## 2020-04-07 ENCOUNTER — Ambulatory Visit (INDEPENDENT_AMBULATORY_CARE_PROVIDER_SITE_OTHER): Payer: Medicare PPO

## 2020-04-07 DIAGNOSIS — I779 Disorder of arteries and arterioles, unspecified: Secondary | ICD-10-CM | POA: Diagnosis not present

## 2020-04-07 DIAGNOSIS — I6523 Occlusion and stenosis of bilateral carotid arteries: Secondary | ICD-10-CM

## 2020-04-07 MED ORDER — METOPROLOL SUCCINATE ER 25 MG PO TB24: 25.0000 mg | ORAL_TABLET | Freq: Two times a day (BID) | ORAL | 3 refills | Status: DC

## 2020-04-07 NOTE — Telephone Encounter (Signed)
Refilled metoprolol 25 mg bid 

## 2020-04-15 ENCOUNTER — Other Ambulatory Visit: Payer: Self-pay | Admitting: *Deleted

## 2020-04-15 DIAGNOSIS — I6523 Occlusion and stenosis of bilateral carotid arteries: Secondary | ICD-10-CM

## 2020-04-18 ENCOUNTER — Other Ambulatory Visit: Payer: Self-pay | Admitting: Cardiology

## 2020-04-21 ENCOUNTER — Telehealth: Payer: Medicare PPO | Admitting: Cardiovascular Disease

## 2020-05-11 ENCOUNTER — Other Ambulatory Visit: Payer: Self-pay | Admitting: Cardiovascular Disease

## 2020-05-11 ENCOUNTER — Ambulatory Visit: Payer: Medicare PPO | Admitting: Cardiovascular Disease

## 2020-05-11 ENCOUNTER — Encounter: Payer: Self-pay | Admitting: Cardiovascular Disease

## 2020-05-11 ENCOUNTER — Other Ambulatory Visit: Payer: Self-pay

## 2020-05-11 VITALS — BP 158/82 | HR 70 | Ht 63.0 in | Wt 209.6 lb

## 2020-05-11 DIAGNOSIS — I739 Peripheral vascular disease, unspecified: Secondary | ICD-10-CM

## 2020-05-11 DIAGNOSIS — I2510 Atherosclerotic heart disease of native coronary artery without angina pectoris: Secondary | ICD-10-CM

## 2020-05-11 DIAGNOSIS — I5022 Chronic systolic (congestive) heart failure: Secondary | ICD-10-CM

## 2020-05-11 DIAGNOSIS — E785 Hyperlipidemia, unspecified: Secondary | ICD-10-CM | POA: Diagnosis not present

## 2020-05-11 NOTE — Progress Notes (Signed)
Cardiology Office Note   Date:  05/11/2020   ID:  Kiara [name], DOB 07/05/1951, MRN [id number]  PCP:  Majel Homer, MD  Cardiologist: Dr. Antoine Poche.  No chief complaint on file.     History of Present Illness: Kiara [name] is a 69 y.o. female who is here today for follow-up visit regarding peripheral arterial disease . She has known history of coronary artery disease status post CABG, chronic systolic heart failure, diabetes mellitus, hyperlipidemia and diabetes mellitus.  She is been diabetic for 60 years.  She had amputation of the right lateral toes in 2006. She had a non-ST elevation myocardial infarction in January of 2019.  Cardiac catheterization via the right femoral artery showed severe native vessel coronary artery disease with patent LIMA to LAD and patent SVG to right PDA.  Filling pressures were severely elevated with severe pulmonary hypertension.  Medical therapy was recommended.   She was seen in 2019 for severe bilateral leg claudication. Vascular studies showed an ABI of 0.92 on the right and 0.93 on the left.  Toe pressure was significantly abnormal bilaterally.    Duplex showed moderate bilateral common femoral artery and SFA disease. CO2 angiography was performed in August 2019 which showed mild disease affecting the right common femoral and SFA disease with three-vessel runoff below the knee.  In the left, there was moderate calcified common iliac artery stenosis and moderate distal SFA disease.  The disease was not significant enough to explain severity of her symptoms.  She reports worsening bilateral calf claudication which is equal in [name] sides.  No rest pain or lower extremity ulceration.  No recent chest pain or shortness of breath.  Past Medical History:  Diagnosis Date  . Anemia   . Anxiety   . Arthritis   . Carotid artery disease (HCC)    a. Last carotid study in 2014 showed 1-39% RICA, 40-59% LICA.  Marland Kitchen Chronic combined systolic (congestive)  and diastolic (congestive) heart failure (HCC)    a. EF 40-45% in 2014. b. EF 30-35% in 07/2017.  . CKD (chronic kidney disease), stage III (HCC)   . Coronary artery disease    a. s/p CABG 2014. b. Cath 07/2017 for NSTEMI -> med rx.  . Diabetes mellitus (HCC)   . Diabetic peripheral neuropathy (HCC)   . Eczema   . GERD (gastroesophageal reflux disease)   . H/O hiatal hernia   . Hyperlipidemia   . Hypertension   . Myocardial infarction (HCC)   . PONV (postoperative nausea and vomiting)   . Sleep apnea   . Umbilical hernia     Past Surgical History:  Procedure Laterality Date  . ABDOMINAL AORTOGRAM W/LOWER EXTREMITY N/A 03/06/2018   Procedure: ABDOMINAL AORTOGRAM W/LOWER EXTREMITY;  Surgeon: Iran Ouch, MD;  Location: [facility];  Service: Cardiovascular;  Laterality: N/A;  . ABDOMINAL HYSTERECTOMY    . 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 CARDIAC CATHETERIZATION    . CARPAL TUNNEL RELEASE    . CESAREAN SECTION  1983  . CORONARY ARTERY BYPASS GRAFT N/A 04/11/2013   Procedure: CORONARY ARTERY BYPASS GRAFTING (CABG);  Surgeon: Alleen Borne, MD;  Location: [facility] OR;  Service: Open Heart Surgery;  Laterality: N/A;  . EYE SURGERY Bilateral    laser  . FRACTURE SURGERY Left 2005  . RIGHT/LEFT HEART CATH AND CORONARY/GRAFT ANGIOGRAPHY N/A 08/06/2017   Procedure: RIGHT/LEFT HEART CATH AND CORONARY/GRAFT ANGIOGRAPHY;  Surgeon: Yvonne Kendall, MD;  Location:  [facility];  Service: Cardiovascular;  Laterality: N/A;     Current Outpatient Medications  Medication Sig Dispense Refill  . albuterol (PROAIR HFA) 108 (90 BASE) MCG/ACT inhaler Inhale 2 puffs into the lungs every 4 (four) hours as needed.     Marland Kitchen aspirin EC 81 MG tablet Take 81 mg by mouth daily.     . Cholecalciferol (VITAMIN D) 2000 units tablet Take 2,000 Units by mouth daily.    . Coenzyme Q10 (COQ10) 100 MG CAPS Take 100 mg by mouth daily.     Marland Kitchen ezetimibe (ZETIA) 10 MG tablet Take 1 tablet (10 mg total) by  mouth daily. 90 tablet 3  . fluticasone (FLONASE) 50 MCG/ACT nasal spray Place 2 sprays into [name] nostrils daily as needed for allergies or rhinitis.    . furosemide (LASIX) 40 MG tablet Take 1 tablet (40 mg total) by mouth daily.    Marland Kitchen gabapentin (NEURONTIN) 800 MG tablet Take 800 mg by mouth 3 (three) times daily.     . insulin aspart (NOVOLOG) 100 UNIT/ML injection Inject into the skin See admin instructions. 1.25 Units 12 Am-7 Am,  1.75 u/hr 7 AM  Til 12 AM, Bolus 1 unit for every 15 carbs, 1 Unit for every 50 points greater than 150 Insulin pump    . levocetirizine (XYZAL) 5 MG tablet Take 5 mg by mouth daily.     . metoprolol succinate (TOPROL-XL) 25 MG 24 hr tablet Take 1 tablet (25 mg total) by mouth 2 (two) times daily. 180 tablet 3  . omeprazole (PRILOSEC) 40 MG capsule TAKE 1 CAPSULE EVERY DAY    . PARoxetine (PAXIL) 20 MG tablet Take 20 mg by mouth at bedtime.     . polyethylene glycol (MIRALAX / GLYCOLAX) packet Take 17 g by mouth daily as needed for moderate constipation.     . Probiotic Product (PROBIOTIC-10 PO) Take 1 tablet by mouth daily.    . rosuvastatin (CRESTOR) 40 MG tablet TAKE ONE TABLET BY MOUTH ONCE A DAY. STOP LIPITOR 90 tablet 3   No current facility-administered medications for this visit.    Allergies:   Bactrim [sulfamethoxazole-trimethoprim], Codeine, Mobic [meloxicam], Morphine and related, Silvadene [silver sulfadiazine], and Adhesive [tape]    Social History:  The patient  reports that she quit smoking about 9 years ago. Her smoking use included cigarettes. She quit after 25.00 years of use. She has never used smokeless tobacco. She reports that she does not drink alcohol and does not use drugs.   Family History:  The patient's family history includes Heart attack in her brother and mother; Heart disease in her brother and mother; Hypertension in her brother and mother.    ROS:  Please see the history of present illness.   Otherwise, review of systems are  positive for none.   All other systems are reviewed and negative.    PHYSICAL EXAM: VS:  BP (!) 158/82   Pulse 70   Ht 5\' 3"  (1.6 m)   Wt 209 lb 9.6 oz (95.1 kg)   BMI 37.13 kg/m  , BMI Body mass index is 37.13 kg/m. GEN: Well nourished, well developed, in no acute distress  HEENT: normal  Neck: no JVD, carotid bruits, or masses Cardiac: RRR; no murmurs, rubs, or gallops,no edema  Respiratory:  clear to auscultation bilaterally, normal work of breathing GI: soft, nontender, nondistended, + BS MS: no deformity or atrophy  Skin: warm and dry, no rash Neuro:  Strength and sensation are  intact Psych: euthymic mood, full affect Vascular: Femoral pulses +1 bilaterally.  Distal pulses are not palpable.   EKG:  EKG is ordered today. EKG showed normal sinus rhythm with possible left atrial enlargement and low voltage.  Lateral T wave changes suggestive of ischemia.   Recent Labs: 07/28/2019: BUN 30; Creatinine, Ser 1.63; Hemoglobin 11.7; Platelets 248; Potassium 4.7; Sodium 139; TSH 2.066    Lipid Panel    Component Value Date/Time   CHOL 183 08/22/2017 1448   TRIG 143 08/22/2017 1448   HDL 36 (L) 08/22/2017 1448   CHOLHDL 5.1 08/22/2017 1448   VLDL 29 08/22/2017 1448   LDLCALC 118 (H) 08/22/2017 1448      Wt Readings from Last 3 Encounters:  05/11/20 209 lb 9.6 oz (95.1 kg)  03/26/20 209 lb 9.6 oz (95.1 kg)  10/10/19 213 lb 6.4 oz (96.8 kg)       No flowsheet data found.    ASSESSMENT AND PLAN:  1.  Peripheral arterial disease: Angiography in 2019 showed mild to moderate nonobstructive peripheral arterial disease.  She now reports worsening bilateral calf claudication but she reports having good days and bad days.  I am going to repeat her lower extremity arterial Doppler.  2.  Coronary artery disease involving native coronary arteries: Symptoms are well controlled at the present time.  3.  Chronic systolic heart failure: She appears to be euvolemic currently on  optimal medical therapy.  4.  Hyperlipidemia: Currently on rosuvastatin and Zetia.  She gets her labs done with her primary care physician.  5.  Moderate right carotid stenosis: This was stable on most recent Doppler in September.   Disposition:   FU with me in 12 months  Signed,  Lorine Bears, MD  05/11/2020 10:03 AM    [facility]

## 2020-05-11 NOTE — Patient Instructions (Addendum)
Medication Instructions:  No changes *If you need a refill on your cardiac medications before your next appointment, please call your pharmacy*   Lab Work: None ordered If you have labs (blood work) drawn today and your tests are completely normal, you will receive your results only by: Marland Kitchen MyChart Message (if you have MyChart) OR . A paper copy in the mail If you have any lab test that is abnormal or we need to change your treatment, we will call you to review the results.   Testing/Procedures: Your physician has requested that you have a lower extremity segmental doppler. This will take place at the [facility] office  Follow-Up: At [facility], you and your health needs are our priority.  As part of our continuing mission to provide you with exceptional heart care, we have created designated Provider Care Teams.  These Care Teams include your primary Cardiologist (physician) and Advanced Practice Providers (APPs -  Physician Assistants and Nurse Practitioners) who all work together to provide you with the care you need, when you need it.  We recommend signing up for the patient portal called "MyChart".  Sign up information is provided on this After Visit Summary.  MyChart is used to connect with patients for Virtual Visits (Telemedicine).  Patients are able to view lab/test results, encounter notes, upcoming appointments, etc.  Non-urgent messages can be sent to your provider as well.   To learn more about what you can do with MyChart, go to [email or web address].    Your next appointment:   12 month(s)  The format for your next appointment:   In Person  Provider:   Lorine Bears, MD

## 2020-05-19 ENCOUNTER — Other Ambulatory Visit: Payer: Self-pay | Admitting: Cardiology

## 2020-06-10 ENCOUNTER — Ambulatory Visit (INDEPENDENT_AMBULATORY_CARE_PROVIDER_SITE_OTHER): Payer: Medicare PPO

## 2020-06-10 DIAGNOSIS — I739 Peripheral vascular disease, unspecified: Secondary | ICD-10-CM | POA: Diagnosis not present

## 2020-08-02 NOTE — Progress Notes (Deleted)
Cardiology Office Note   Date:  08/02/2020   ID:  Kiara [name], DOB [birth year], MRN [id number]  PCP:  Allie Dimmer, MD  Cardiologist:   Kate Sable, MD (Inactive)   No chief complaint on file.     History of Present Illness: Kiara [name] is a 70 y.o. female who presents for  routine follow-up of CAD.  Echocardiogram on 07/29/2019 demonstrated normalization of LV systolic function, LVEF 55 to 60%, mild concentric LVH, and normal regional wall motion.  At a previous visit she was hypotensive and Entresto was discontinued.     Since she was last seen she has been taking care of her mom who was 12.  She has not been taking care of herself very well.  Her mom died on 2022/12/12.  The patient just had blood work done at her primary care office and I did see an A1c that was 14.1.  The patient has not been bolusing her insulin as suggested.  However, she did not have any other meds adjusted.  The patient says she is not particularly following her diet as she should.  She has not been getting any exercise because she was taking care of her mom.  She denies any chest pressure, neck or arm discomfort.  She has had no new shortness of breath, PND or orthopnea.  She said no palpitations, presyncope or syncope.  She has been fatigued.   Past Medical History:  Diagnosis Date  . Anemia   . Anxiety   . Arthritis   . Carotid artery disease ([place])    a. Last carotid study in 2014 showed 3-41% RICA, 96-22% LICA.  Marland Kitchen Chronic combined systolic (congestive) and diastolic (congestive) heart failure (HCC)    a. EF 40-45% in 2014. b. EF 30-35% in 07/2017.  . CKD (chronic kidney disease), stage III ([place])   . Coronary artery disease    a. s/p CABG 2014. b. Cath 07/2017 for NSTEMI -> med rx.  . Diabetes mellitus ([place])   . Diabetic peripheral neuropathy ([place])   . Eczema   . GERD (gastroesophageal reflux disease)   . H/O hiatal hernia   . Hyperlipidemia   . Hypertension   . Myocardial  infarction ([place])   . PONV (postoperative nausea and vomiting)   . Sleep apnea   . Umbilical hernia     Past Surgical History:  Procedure Laterality Date  . ABDOMINAL AORTOGRAM W/LOWER EXTREMITY N/A 03/06/2018   Procedure: ABDOMINAL AORTOGRAM W/LOWER EXTREMITY;  Surgeon: Wellington Hampshire, MD;  Location: [facility];  Service: Cardiovascular;  Laterality: N/A;  . ABDOMINAL HYSTERECTOMY    . 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 CARDIAC CATHETERIZATION    . CARPAL TUNNEL RELEASE    . CESAREAN SECTION  1983  . CORONARY ARTERY BYPASS GRAFT N/A 04/11/2013   Procedure: CORONARY ARTERY BYPASS GRAFTING (CABG);  Surgeon: Gaye Pollack, MD;  Location: [facility];  Service: Open Heart Surgery;  Laterality: N/A;  . EYE SURGERY Bilateral    laser  . FRACTURE SURGERY Left 2005  . RIGHT/LEFT HEART CATH AND CORONARY/GRAFT ANGIOGRAPHY N/A 08/06/2017   Procedure: RIGHT/LEFT HEART CATH AND CORONARY/GRAFT ANGIOGRAPHY;  Surgeon: Nelva Bush, MD;  Location: [facility];  Service: Cardiovascular;  Laterality: N/A;     Current Outpatient Medications  Medication Sig Dispense Refill  . albuterol (PROAIR HFA) 108 (90 BASE) MCG/ACT inhaler Inhale 2 puffs into the lungs every 4 (four) hours as  needed.     Marland Kitchen aspirin EC 81 MG tablet Take 81 mg by mouth daily.     . Cholecalciferol (VITAMIN D) 2000 units tablet Take 2,000 Units by mouth daily.    . Coenzyme Q10 (COQ10) 100 MG CAPS Take 100 mg by mouth daily.     Marland Kitchen ezetimibe (ZETIA) 10 MG tablet TAKE 1 TABLET EVERY DAY 90 tablet 3  . fluticasone (FLONASE) 50 MCG/ACT nasal spray Place 2 sprays into both nostrils daily as needed for allergies or rhinitis.    . furosemide (LASIX) 40 MG tablet TAKE 1 TABLET TWICE DAILY 180 tablet 2  . gabapentin (NEURONTIN) 800 MG tablet Take 800 mg by mouth 3 (three) times daily.     . insulin aspart (NOVOLOG) 100 UNIT/ML injection Inject into the skin See admin instructions. 1.25 Units 12 Am-7 Am,  1.75 u/hr 7 AM  Til  12 AM, Bolus 1 unit for every 15 carbs, 1 Unit for every 50 points greater than 150 Insulin pump    . levocetirizine (XYZAL) 5 MG tablet Take 5 mg by mouth daily.     . metoprolol succinate (TOPROL-XL) 25 MG 24 hr tablet Take 1 tablet (25 mg total) by mouth 2 (two) times daily. 180 tablet 3  . omeprazole (PRILOSEC) 40 MG capsule TAKE 1 CAPSULE EVERY DAY    . PARoxetine (PAXIL) 20 MG tablet Take 20 mg by mouth at bedtime.     . polyethylene glycol (MIRALAX / GLYCOLAX) packet Take 17 g by mouth daily as needed for moderate constipation.     . Probiotic Product (PROBIOTIC-10 PO) Take 1 tablet by mouth daily.    . rosuvastatin (CRESTOR) 40 MG tablet TAKE ONE TABLET BY MOUTH ONCE A DAY. STOP LIPITOR 90 tablet 3   No current facility-administered medications for this visit.    Allergies:   Bactrim [sulfamethoxazole-trimethoprim], Codeine, Mobic [meloxicam], Morphine and related, Silvadene [silver sulfadiazine], and Adhesive [tape]    ROS:  Please see the history of present illness.   Otherwise, review of systems are positive for none.   All other systems are reviewed and negative.    PHYSICAL EXAM: VS:  There were no vitals taken for this visit. , BMI There is no height or weight on file to calculate BMI. GENERAL:  Well appearing NECK:  No jugular venous distention, waveform within normal limits, carotid upstroke brisk and symmetric, no bruits, no thyromegaly LUNGS:  Clear to auscultation bilaterally BACK:  No CVA tenderness CHEST:  Well healed sternotomy scar. HEART:  PMI not displaced or sustained,S1 and S2 within normal limits, no S3, no S4, no clicks, no rubs, no murmurs ABD:  Flat, positive bowel sounds normal in frequency in pitch, no bruits, no rebound, no guarding, no midline pulsatile mass, no hepatomegaly, no splenomegaly EXT:  2 plus pulses upper, decreased dorsalis pedis and posterior tibialis bilateral, no edema, no cyanosis no clubbing, absent fifth and fourth toe on the right  foot    EKG:  EKG is not ordered today.   Recent Labs: No results found for requested labs within last 8760 hours.    Lipid Panel    Component Value Date/Time   CHOL 183 08/22/2017 1448   TRIG 143 08/22/2017 1448   HDL 36 (L) 08/22/2017 1448   CHOLHDL 5.1 08/22/2017 1448   VLDL 29 08/22/2017 1448   LDLCALC 118 (H) 08/22/2017 1448      Wt Readings from Last 3 Encounters:  05/11/20 209 lb 9.6 oz (95.1 kg)  03/26/20 209 lb 9.6 oz (95.1 kg)  10/10/19 213 lb 6.4 oz (96.8 kg)    Diagnostic Dominance: Right   Other studies Reviewed: Additional studies/ records that were reviewed today include: Labs. Review of the above records demonstrates:  Please see elsewhere in the note.     ASSESSMENT AND PLAN:  Coronary disease with history of CABG:    She has no symptoms.  She needs aggressive risk reduction.  No change in therapy.  Chronic diastolic heart failure:  She seems to be euvolemic.  We talked about salt and fluid restriction.  She was told to hydrate because of her diabetes and I asked her not to.  We talked instead about aggressive diet and potential follow-up for med changes.  See below.   DM:  14.1 A1C.  I will defer to Majel Homer, MD to consider SGLT2 inhibitor or GLP-1 receptor antagonist.  Chronic kidney disease stage III:  Creatinine was 1.63 most recently.  This is stable.   Carotid artery disease:   I will be checking carotid Doppler.  Hyperlipidemia:   She will let me know what this is.  I cannot access the most recent readings.  Hypertension:   The blood pressure is at target.  It was low when she did not tolerate Entresto.  No change in therapy.  Peripheral arterial disease: Follows with Dr. Kirke Corin.   She has mild to moderate nonobstructive disease.    Continue current risk reduction.    Covid education: She has been vaccinated.   Current medicines are reviewed at length with the patient today.  The patient does not have concerns regarding  medicines.  The following changes have been made:  no change  Labs/ tests ordered today include: None  No orders of the defined types were placed in this encounter.    Disposition:   FU with me in 4 months.     Signed, Rollene Rotunda, MD  08/02/2020 8:53 AM    [facility]

## 2020-08-04 ENCOUNTER — Ambulatory Visit: Payer: Medicare PPO | Admitting: Cardiology

## 2020-10-14 ENCOUNTER — Ambulatory Visit: Payer: Medicare PPO | Admitting: Cardiology

## 2020-10-14 DIAGNOSIS — E119 Type 2 diabetes mellitus without complications: Secondary | ICD-10-CM | POA: Insufficient documentation

## 2020-10-14 DIAGNOSIS — E118 Type 2 diabetes mellitus with unspecified complications: Secondary | ICD-10-CM | POA: Insufficient documentation

## 2020-10-14 NOTE — Progress Notes (Signed)
Cardiology Office Note   Date:  10/15/2020   ID:  Kiara [name], DOB 1951-07-12, MRN [id number]  PCP:  Majel Homer, MD  Cardiologist:   Rollene Rotunda, MD   Chief Complaint  Patient presents with  . Coronary Artery Disease      History of Present Illness: Kiara [name] is a 70 y.o. female who presents for  routine follow-up of CAD.  Echocardiogram on 07/29/2019 demonstrated LVEF 55 to 60%, mild concentric LVH, and normal regional wall motion.  She has not tolerated med titration with Entresto discontinued secondary to hypotension.      She has seen Dr. Kirke Corin for PVD.  In the fall she had Dopplers which demonstrated that her disease was unchanged 50 to 74% bilaterally.  Since I last saw her she has had trouble with her diabetes.  She was placed on Rezulin for a while.  Her pump was not working.  She is starting to get this on track.  Her A1c has been greater than 14 and went [name] to 10 and most recent was 11.1.  She a little dog is a Garment/textile technologist and she is walking his dog.  She is eating better.  The patient denies any new symptoms such as chest discomfort, neck or arm discomfort. There has been no new shortness of breath, PND or orthopnea. There have been no reported palpitations, presyncope or syncope.    Past Medical History:  Diagnosis Date  . Anemia   . Anxiety   . Arthritis   . Carotid artery disease (HCC)    a. Last carotid study in 2014 showed 1-39% RICA, 40-59% LICA.  Marland Kitchen Chronic combined systolic (congestive) and diastolic (congestive) heart failure (HCC)    a. EF 40-45% in 2014. b. EF 30-35% in 07/2017.  . CKD (chronic kidney disease), stage III (HCC)   . Coronary artery disease    a. s/p CABG 2014. b. Cath 07/2017 for NSTEMI -> med rx.  . Diabetes mellitus (HCC)   . Diabetic peripheral neuropathy (HCC)   . Eczema   . GERD (gastroesophageal reflux disease)   . H/O hiatal hernia   . Hyperlipidemia   . Hypertension   . Myocardial infarction (HCC)   . PONV  (postoperative nausea and vomiting)   . Sleep apnea   . Umbilical hernia     Past Surgical History:  Procedure Laterality Date  . ABDOMINAL AORTOGRAM W/LOWER EXTREMITY N/A 03/06/2018   Procedure: ABDOMINAL AORTOGRAM W/LOWER EXTREMITY;  Surgeon: Iran Ouch, MD;  Location: [facility];  Service: Cardiovascular;  Laterality: N/A;  . ABDOMINAL HYSTERECTOMY    . 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 CARDIAC CATHETERIZATION    . CARPAL TUNNEL RELEASE    . CESAREAN SECTION  1983  . CORONARY ARTERY BYPASS GRAFT N/A 04/11/2013   Procedure: CORONARY ARTERY BYPASS GRAFTING (CABG);  Surgeon: Alleen Borne, MD;  Location: [facility];  Service: Open Heart Surgery;  Laterality: N/A;  . EYE SURGERY Bilateral    laser  . FRACTURE SURGERY Left 2005  . RIGHT/LEFT HEART CATH AND CORONARY/GRAFT ANGIOGRAPHY N/A 08/06/2017   Procedure: RIGHT/LEFT HEART CATH AND CORONARY/GRAFT ANGIOGRAPHY;  Surgeon: Yvonne Kendall, MD;  Location: [facility];  Service: Cardiovascular;  Laterality: N/A;     Current Outpatient Medications  Medication Sig Dispense Refill  . albuterol (VENTOLIN HFA) 108 (90 Base) MCG/ACT inhaler Inhale 2 puffs into the lungs every 4 (four) hours as needed.     Marland Kitchen  aspirin EC 81 MG tablet Take 81 mg by mouth daily.     . Cholecalciferol (VITAMIN D) 2000 units tablet Take 2,000 Units by mouth daily.    . Coenzyme Q10 (COQ10) 100 MG CAPS Take 100 mg by mouth daily.     Marland Kitchen ezetimibe (ZETIA) 10 MG tablet TAKE 1 TABLET EVERY DAY 90 tablet 3  . fluticasone (FLONASE) 50 MCG/ACT nasal spray Place 2 sprays into both nostrils daily as needed for allergies or rhinitis.    . furosemide (LASIX) 40 MG tablet TAKE 1 TABLET TWICE DAILY 180 tablet 2  . gabapentin (NEURONTIN) 800 MG tablet Take 800 mg by mouth 3 (three) times daily.     . insulin aspart (NOVOLOG) 100 UNIT/ML injection Inject into the skin See admin instructions. 1.25 Units 12 Am-7 Am,  1.75 u/hr 7 AM  Til 12 AM, Bolus 1 unit for  every 15 carbs, 1 Unit for every 50 points greater than 150 Insulin pump    . levocetirizine (XYZAL) 5 MG tablet Take 5 mg by mouth daily.     . metoprolol succinate (TOPROL-XL) 25 MG 24 hr tablet Take 1 tablet (25 mg total) by mouth 2 (two) times daily. 180 tablet 3  . omeprazole (PRILOSEC) 40 MG capsule TAKE 1 CAPSULE EVERY DAY    . PARoxetine (PAXIL) 20 MG tablet Take 20 mg by mouth at bedtime.     . polyethylene glycol (MIRALAX / GLYCOLAX) packet Take 17 g by mouth daily as needed for moderate constipation.     . Probiotic Product (PROBIOTIC-10 PO) Take 1 tablet by mouth daily.    . rosuvastatin (CRESTOR) 40 MG tablet TAKE ONE TABLET BY MOUTH ONCE A DAY. STOP LIPITOR 90 tablet 3   No current facility-administered medications for this visit.    Allergies:   Bactrim [sulfamethoxazole-trimethoprim], Codeine, Mobic [meloxicam], Morphine and related, Silvadene [silver sulfadiazine], and Adhesive [tape]    ROS:  Please see the history of present illness.   Otherwise, review of systems are positive for none.   All other systems are reviewed and negative.    PHYSICAL EXAM: VS:  BP 120/64   Pulse 73   Ht 5\' 3"  (1.6 m)   Wt 220 lb (99.8 kg)   SpO2 94%   BMI 38.97 kg/m  , BMI Body mass index is 38.97 kg/m. GENERAL:  Well appearing NECK:  No jugular venous distention, waveform within normal limits, carotid upstroke brisk and symmetric, no bruits, no thyromegaly LUNGS:  Clear to auscultation bilaterally CHEST:  Well healed sternotomy scar. HEART:  PMI not displaced or sustained,S1 and S2 within normal limits, no S3, no S4, no clicks, no rubs, no murmurs ABD:  Flat, positive bowel sounds normal in frequency in pitch, no bruits, no rebound, no guarding, no midline pulsatile mass, no hepatomegaly, no splenomegaly EXT:  2 plus pulses upper and decreased dorsalis pedis and posterior tibialis bilateral, no edema, no cyanosis no clubbing  EKG:  EKG is not ordered today.   Recent Labs: No  results found for requested labs within last 8760 hours.    Lipid Panel    Component Value Date/Time   CHOL 183 08/22/2017 1448   TRIG 143 08/22/2017 1448   HDL 36 (L) 08/22/2017 1448   CHOLHDL 5.1 08/22/2017 1448   VLDL 29 08/22/2017 1448   LDLCALC 118 (H) 08/22/2017 1448      Wt Readings from Last 3 Encounters:  10/15/20 220 lb (99.8 kg)  05/11/20 209 lb 9.6 oz (95.1  kg)  03/26/20 209 lb 9.6 oz (95.1 kg)      Other studies Reviewed: Additional studies/ records that were reviewed today include: Labs. Review of the above records demonstrates:  Please see elsewhere in the note.     ASSESSMENT AND PLAN:  PVD:  She has seen Dr. Kirke Corin.  She had non obstructive disease in 2019.    This was unchanged on Doppler in the fall.  We will continue with risk reduction.  Coronary disease with history of CABG:    The patient has no new sypmtoms.  No further cardiovascular testing is indicated.  We will continue with aggressive risk reduction and meds as listed.  Chronicdiastolic heart failure:  She seems to be euvolemic.  No change in therapy.   DM:    A1c was [name] to 11.1 from 14.1.  I will defer to Majel Homer, MD   Chronic kidney disease stage [id number] was 1.63 which is stable.  Carotid artery disease:    Hyperlipidemia:    LDL was 55.6 with an HDL 40.4 in March.  No change in therapy.   Hypertension:   The blood pressure is at target.  No change in therapy.    Current medicines are reviewed at length with the patient today.  The patient does not have concerns regarding medicines.  The following changes have been made:  no change  Labs/ tests ordered today include:  No orders of the defined types were placed in this encounter.    Disposition:   FU with me in one year in [place], Rollene Rotunda, MD  10/15/2020 9:42 AM    [facility]

## 2020-10-15 ENCOUNTER — Encounter: Payer: Self-pay | Admitting: Cardiology

## 2020-10-15 ENCOUNTER — Ambulatory Visit: Payer: Medicare PPO | Admitting: Cardiology

## 2020-10-15 VITALS — BP 120/64 | HR 73 | Ht 63.0 in | Wt 220.0 lb

## 2020-10-15 DIAGNOSIS — N1831 Chronic kidney disease, stage 3a: Secondary | ICD-10-CM

## 2020-10-15 DIAGNOSIS — I5032 Chronic diastolic (congestive) heart failure: Secondary | ICD-10-CM | POA: Diagnosis not present

## 2020-10-15 DIAGNOSIS — E118 Type 2 diabetes mellitus with unspecified complications: Secondary | ICD-10-CM | POA: Diagnosis not present

## 2020-10-15 DIAGNOSIS — E785 Hyperlipidemia, unspecified: Secondary | ICD-10-CM

## 2020-10-15 DIAGNOSIS — I10 Essential (primary) hypertension: Secondary | ICD-10-CM

## 2020-10-15 DIAGNOSIS — I2584 Coronary atherosclerosis due to calcified coronary lesion: Secondary | ICD-10-CM

## 2020-10-15 NOTE — Patient Instructions (Addendum)
Medication Instructions:   Your physician recommends that you continue on your current medications as directed. Please refer to the Current Medication list given to you today.  Labwork:  none  Testing/Procedures:  none  Follow-Up:  Your physician recommends that you schedule a follow-up appointment in: 1 year in [place]. You will receive a reminder letter in the mail in about 10 months reminding you to call and schedule your appointment. If you don't receive this letter, please contact our office.  Any Other Special Instructions Will Be Listed Below (If Applicable).  If you need a refill on your cardiac medications before your next appointment, please call your pharmacy.

## 2021-01-26 ENCOUNTER — Other Ambulatory Visit: Payer: Self-pay | Admitting: Cardiology

## 2021-03-07 ENCOUNTER — Other Ambulatory Visit: Payer: Self-pay | Admitting: Cardiology

## 2021-04-13 ENCOUNTER — Other Ambulatory Visit: Payer: Self-pay | Admitting: Cardiology

## 2021-04-18 ENCOUNTER — Inpatient Hospital Stay (HOSPITAL_COMMUNITY): Admission: RE | Admit: 2021-04-18 | Payer: Medicare PPO | Source: Ambulatory Visit

## 2021-04-20 ENCOUNTER — Encounter (HOSPITAL_COMMUNITY): Payer: Medicare PPO

## 2021-04-22 ENCOUNTER — Other Ambulatory Visit: Payer: Self-pay

## 2021-04-22 ENCOUNTER — Other Ambulatory Visit (HOSPITAL_COMMUNITY): Payer: Self-pay | Admitting: Cardiology

## 2021-04-22 ENCOUNTER — Ambulatory Visit (HOSPITAL_COMMUNITY)
Admission: RE | Admit: 2021-04-22 | Discharge: 2021-04-22 | Disposition: A | Discharge status: Home / Self Care | Payer: Medicare PPO | Source: Ambulatory Visit | Attending: Cardiovascular Disease | Admitting: Cardiovascular Disease

## 2021-04-22 DIAGNOSIS — I6523 Occlusion and stenosis of bilateral carotid arteries: Secondary | ICD-10-CM

## 2021-05-02 ENCOUNTER — Encounter: Payer: Self-pay | Admitting: *Deleted

## 2021-08-09 ENCOUNTER — Telehealth: Payer: Self-pay | Admitting: "Endocrinology

## 2021-08-09 NOTE — Telephone Encounter (Signed)
Received referral on pt. Called and left VM 10/27 and 12/6. Patient is not responding. Closed referral

## 2021-11-14 ENCOUNTER — Telehealth: Payer: Self-pay | Admitting: Cardiology

## 2021-11-14 NOTE — Telephone Encounter (Signed)
?  Patient would like to switch from Dr Antoine Poche to Dr Izora Ribas in [place] because she does not want to go to [place].  ?

## 2021-12-21 ENCOUNTER — Ambulatory Visit: Payer: Medicare PPO | Admitting: Cardiology

## 2022-01-16 ENCOUNTER — Encounter: Payer: Self-pay | Admitting: Internal Medicine

## 2022-01-16 ENCOUNTER — Ambulatory Visit: Payer: Medicare PPO | Admitting: Internal Medicine

## 2022-01-16 VITALS — BP 122/74 | HR 70 | Ht 63.0 in | Wt 214.4 lb

## 2022-01-16 DIAGNOSIS — I739 Peripheral vascular disease, unspecified: Secondary | ICD-10-CM | POA: Diagnosis not present

## 2022-01-16 DIAGNOSIS — I509 Heart failure, unspecified: Secondary | ICD-10-CM

## 2022-01-16 DIAGNOSIS — I779 Disorder of arteries and arterioles, unspecified: Secondary | ICD-10-CM | POA: Diagnosis not present

## 2022-01-16 DIAGNOSIS — E119 Type 2 diabetes mellitus without complications: Secondary | ICD-10-CM

## 2022-01-16 DIAGNOSIS — Z951 Presence of aortocoronary bypass graft: Secondary | ICD-10-CM

## 2022-01-16 DIAGNOSIS — I2510 Atherosclerotic heart disease of native coronary artery without angina pectoris: Secondary | ICD-10-CM

## 2022-01-16 DIAGNOSIS — N1831 Chronic kidney disease, stage 3a: Secondary | ICD-10-CM

## 2022-01-16 DIAGNOSIS — E785 Hyperlipidemia, unspecified: Secondary | ICD-10-CM

## 2022-01-16 DIAGNOSIS — R9431 Abnormal electrocardiogram [ECG] [EKG]: Secondary | ICD-10-CM | POA: Diagnosis not present

## 2022-01-16 DIAGNOSIS — E1122 Type 2 diabetes mellitus with diabetic chronic kidney disease: Secondary | ICD-10-CM | POA: Diagnosis not present

## 2022-01-16 DIAGNOSIS — Z6837 Body mass index (BMI) 37.0-37.9, adult: Secondary | ICD-10-CM

## 2022-01-16 DIAGNOSIS — I130 Hypertensive heart and chronic kidney disease with heart failure and stage 1 through stage 4 chronic kidney disease, or unspecified chronic kidney disease: Secondary | ICD-10-CM | POA: Diagnosis not present

## 2022-01-16 DIAGNOSIS — G4733 Obstructive sleep apnea (adult) (pediatric): Secondary | ICD-10-CM

## 2022-01-16 DIAGNOSIS — I2584 Coronary atherosclerosis due to calcified coronary lesion: Secondary | ICD-10-CM

## 2022-01-16 DIAGNOSIS — I10 Essential (primary) hypertension: Secondary | ICD-10-CM

## 2022-01-16 DIAGNOSIS — E1169 Type 2 diabetes mellitus with other specified complication: Secondary | ICD-10-CM

## 2022-01-16 MED ORDER — CILOSTAZOL 100 MG PO TABS: 100.0000 mg | ORAL_TABLET | Freq: Two times a day (BID) | ORAL | 3 refills | Status: DC

## 2022-01-16 NOTE — Progress Notes (Signed)
Cardiology Office Note:    Date:  01/16/2022   ID:  Kiara [name], DOB 05-18-1951, MRN [id number]  PCP:  Majel Homer, MD   [facility] Providers Cardiologist:  Rollene Rotunda, MD     Referring MD: Majel Homer, MD   CC: New provider switch  History of Present Illness:    Kiara [name] is a 71 y.o. female with a hx of CAD s/p CABG ( 2014, medical therapy at last eval had decompensated HF), Ischemic cardiomyopathy EF 30% with question of intolerance to Entresto- > EF 55% to, PAD seeing Dr. Kirke Corin, CAS,HL with DM, Morbid Obesity,  HLD with DM, and CKD stage IIIa.  OSA on CPAP.  Patient notes that she is doing well.   Since last visit notes that she was doing well; she once saw [facility] and was told she does not need to come back . There are no interval hospital/ED visit.    No chest pain or pressure.  Her anginal equivalent was SOB (couldn't get a good breath) No SOB/DOE but is minimally activity and no PND/Orthopnea.  No weight gain or leg swelling.  No palpitations or syncope.   Past Medical History:  Diagnosis Date   Anemia    Anxiety    Arthritis    Carotid artery disease (HCC)    a. Last carotid study in 2014 showed 1-39% RICA, 40-59% LICA.   Chronic combined systolic (congestive) and diastolic (congestive) heart failure (HCC)    a. EF 40-45% in 2014. b. EF 30-35% in 07/2017.   CKD (chronic kidney disease), stage III (HCC)    Coronary artery disease    a. s/p CABG 2014. b. Cath 07/2017 for NSTEMI -> med rx.   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     CARDIAC CATHETERIZATION     CARPAL TUNNEL RELEASE     CESAREAN SECTION  1983   CORONARY ARTERY BYPASS GRAFT N/A 04/11/2013   Procedure: CORONARY ARTERY BYPASS GRAFTING (CABG);  Surgeon: Alleen Borne, MD;  Location: [facility] OR;  Service: Open Heart Surgery;  Laterality: N/A;   EYE SURGERY Bilateral    laser   FRACTURE SURGERY Left 2005   RIGHT/LEFT HEART CATH AND CORONARY/GRAFT ANGIOGRAPHY N/A 08/06/2017   Procedure: RIGHT/LEFT HEART CATH AND CORONARY/GRAFT ANGIOGRAPHY;  Surgeon: Yvonne Kendall, MD;  Location: [facility];  Service: Cardiovascular;  Laterality: N/A;    Current Medications: Current Meds  Medication Sig   albuterol (VENTOLIN HFA) 108 (90 Base) MCG/ACT inhaler Inhale 2 puffs into the lungs every 4 (four) hours as needed.    aspirin EC 81 MG tablet Take 81 mg by mouth daily.    Cholecalciferol (VITAMIN D) 2000 units tablet Take 2,000 Units by mouth daily.   cilostazol (PLETAL) 100 MG tablet Take 1 tablet (100 mg total) by mouth 2 (two)  times daily.   Coenzyme Q10 (COQ10) 100 MG CAPS Take 100 mg by mouth daily.    ezetimibe (ZETIA) 10 MG tablet TAKE 1 TABLET EVERY DAY   fluticasone (FLONASE) 50 MCG/ACT nasal spray Place 2 sprays into both nostrils daily as needed for allergies or rhinitis.   furosemide (LASIX) 40 MG tablet TAKE 1 TABLET TWICE DAILY (Patient taking differently: Take 20 mg by mouth daily. Extra tablet as needed)   gabapentin (NEURONTIN) 800 MG tablet Take 800 mg by mouth 3 (three) times daily.    insulin aspart (NOVOLOG) 100 UNIT/ML injection Inject into the skin See admin instructions. 1.25 Units 12 Am-7 Am,  1.75 u/hr 7 AM  Til 12 AM, Bolus 1 unit for every 15 carbs, 1 Unit for every 50 points greater than 150 Insulin pump   levocetirizine (XYZAL) 5 MG tablet Take 5 mg by mouth daily.     metoprolol succinate (TOPROL-XL) 25 MG 24 hr tablet TAKE 1 TABLET TWICE DAILY   omeprazole (PRILOSEC) 40 MG capsule TAKE 1 CAPSULE EVERY DAY   PARoxetine (PAXIL) 20 MG tablet Take 20 mg by mouth at bedtime.    polyethylene glycol (MIRALAX / GLYCOLAX) packet Take 17 g by mouth daily as needed for moderate constipation.    Probiotic Product (PROBIOTIC-10 PO) Take 1 tablet by mouth daily.   rosuvastatin (CRESTOR) 40 MG tablet TAKE ONE TABLET BY MOUTH ONCE A DAY. STOP LIPITOR   Turmeric 500 MG TABS Take 1 tablet by mouth daily.     Allergies:   Bactrim [sulfamethoxazole-trimethoprim], Codeine, Mobic [meloxicam], Morphine and related, Silvadene [silver sulfadiazine], and Adhesive [tape]   Social History   Socioeconomic History   Marital status: Married    Spouse name: Not on file   Number of children: Not on file   Years of education: Not on file   Highest education level: Not on file  Occupational History   Not on file  Tobacco Use   Smoking status: Former    Years: 25.00    Types: Cigarettes    Quit date: 05/30/2010    Years since quitting: 11.6   Smokeless tobacco: Never   Tobacco comments:    STOPPED 2007  Vaping Use   Vaping Use: Never used  Substance and Sexual Activity   Alcohol use: No   Drug use: No   Sexual activity: Not on file  Other Topics Concern   Not on file  Social History Narrative   Not on file   Social Determinants of Health   Financial Resource Strain: Not on file  Food Insecurity: Not on file  Transportation Needs: Not on file  Physical Activity: Not on file  Stress: Not on file  Social Connections: Not on file    Social: former school bus driver, 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  ROS:   Please see the history of present illness.    All other systems reviewed and are  negative.  EKGs/Labs/Other Studies Reviewed:    The following studies were reviewed today:  EKG:  EKG is  ordered today.  The ekg ordered today demonstrates  6/19: SR 63 with anterior infarct pattern  Recent Labs: No results found for requested labs within last 365 days.  Recent Lipid Panel    Component Value Date/Time   CHOL 183 08/22/2017 1448   TRIG 143 08/22/2017  1448   HDL 36 (L) 08/22/2017 1448   CHOLHDL 5.1 08/22/2017 1448   VLDL 29 08/22/2017 1448   LDLCALC 118 (H) 08/22/2017 1448        Physical Exam:    VS:  BP 122/74   Pulse 70   Ht 5\' 3"  (1.6 m)   Wt 214 lb 6.4 oz (97.3 kg)   SpO2 95%   BMI 37.98 kg/m     Wt Readings from Last 3 Encounters:  01/16/22 214 lb 6.4 oz (97.3 kg)  10/15/20 220 lb (99.8 kg)  05/11/20 209 lb 9.6 oz (95.1 kg)    Gen: no distress, morbid obesity   Neck: No JVD, no carotid bruit Cardiac: No Rubs or Gallops, no murmur, RRR +2 radial pulses Respiratory: Clear to auscultation bilaterally, normal effort, normal  respiratory rate GI: Soft, nontender, non-distended  MS: +1 biltaeral pitting  edema;  moves all extremities Integument: Skin feels warm Neuro:  At time of evaluation, alert and oriented to person/place/time/situation  Psych: Normal affect, patient feels well   ASSESSMENT:    1. Coronary atherosclerosis due to severely calcified coronary lesion   2. Coronary artery disease involving native coronary artery of native heart without angina pectoris   3. Bilateral carotid artery disease, unspecified type (HCC)   4. PVD (peripheral vascular disease) (HCC)   5. Chronic congestive heart failure, unspecified heart failure type (HCC)   6. Diabetes mellitus with coincident hypertension (HCC)   7. Hyperlipidemia associated with type 2 diabetes mellitus (HCC)   8. Stage 3a chronic kidney disease (HCC)   9. S/P CABG x 2    PLAN:    CAD s/p CABG ( [place] 2014, medical therapy at last eval) PAD seeing Dr. 2015, [facility] for  cilastazol HLD with DM CAS midl in 2022; 2025 for next eval  - if CP, will consider repeat cath for SVG to rPDA - continue ASA, zetia, succinate 25 mg and rosuvastatin 40 mg - LDL goal < 55 - will trial cilstazol - small radial in the past with prior groin access  HF recovered EF OSA on CPAP Morbid obesity CKD stage IIIa - continue current  meds, LVEF has recovered - takes lasix 40 mg PO daily, she will take 3 days of extra lasix  We recommended both graded exercise increase (waking 5 minutes more until you are walking at least 30 minutes a day) and starting low weight anaerobic exercise  Stop and sit when you reach moderate intensity pain. ? Resume when pain has completely subsided. ? Continually repeat process for total time  - walking + resting of 30-60 minutes. - Make progressive increases in grade and speed over time as walking duration improves.  We do have a Supervised Exercise Program at [facility] and can refer you if you are interested.     Six months with me   Time Spent Directly with Patient:   I have spent a total of 40 minutes with the patient reviewing notes, imaging, EKGs, labs and examining the patient as well as establishing an assessment and plan that was discussed personally with the patient.  > 50% of time was spent in direct patient care.   Medication Adjustments/Labs and Tests Ordered: Current medicines are reviewed at length with the patient today.  Concerns regarding medicines are outlined above.  Orders Placed This Encounter  Procedures   EKG 12-Lead   Meds ordered this encounter  Medications   cilostazol (PLETAL) 100 MG tablet    Sig: Take 1  tablet (100 mg total) by mouth 2 (two) times daily.    Dispense:  180 tablet    Refill:  3    Patient Instructions  Medication Instructions:  Your physician has recommended you make the following change in your medication:  Start Pletal 100 mg tablets twice daily Increase Lasix 40 mg to 40 mg twice daily  for 3 days.   Labwork: None  Testing/Procedures: None  Follow-Up: Follow up with Dr. Gasper Sells in 6 months.   Any Other Special Instructions Will Be Listed Below (If Applicable).     If you need a refill on your cardiac medications before your next appointment, please call your pharmacy.    Signed, Werner Lean, MD  01/16/2022 5:01 PM    [facility]

## 2022-01-16 NOTE — Patient Instructions (Addendum)
Medication Instructions:  Your physician has recommended you make the following change in your medication:  Start Pletal 100 mg tablets twice daily Increase Lasix 40 mg to 40 mg twice daily for 3 days.   Labwork: None  Testing/Procedures: None  Follow-Up: Follow up with Dr. Izora Ribas in 6 months.   Any Other Special Instructions Will Be Listed Below (If Applicable).     If you need a refill on your cardiac medications before your next appointment, please call your pharmacy.

## 2022-01-28 ENCOUNTER — Other Ambulatory Visit: Payer: Self-pay | Admitting: Cardiology

## 2022-03-08 ENCOUNTER — Other Ambulatory Visit: Payer: Self-pay | Admitting: Cardiology

## 2022-03-28 ENCOUNTER — Other Ambulatory Visit: Payer: Self-pay | Admitting: Cardiology

## 2022-04-12 ENCOUNTER — Ambulatory Visit: Payer: Medicare PPO | Attending: Cardiology

## 2022-04-12 DIAGNOSIS — I6523 Occlusion and stenosis of bilateral carotid arteries: Secondary | ICD-10-CM

## 2022-07-16 NOTE — Progress Notes (Unsigned)
Cardiology Office Note:    Date:  07/17/2022   ID:  Shanon Brow, DOB 1951-04-16, MRN [id number]  PCP:  Majel Homer, MD   [facility] Providers Cardiologist:  Rollene Rotunda, MD     Referring MD: Majel Homer, MD   CC: Here for 86-month follow-up  History of Present Illness:    Kiara [name] is a 71 y.o. female with a hx of the following:  CAD, s/p NSTEMI, s/p CABG x 2 CHF, Ischemic Cardiomyopathy (EF 30% -> EF 55%) Bilateral carotid disease Type 1 diabetes OSA on CPAP GERD CKD stage 3a PAD (has seen Dr. Kirke Corin)  Patient is a very delightful 70 year old female with past medical history as mentioned above.   Underwent CABG x 2 in 2014.  Repeat cardiac catheterization in January 2019 for NSTEMI showed patent grafts with severe, diffuse native CAD, with severely elevated left and right heart filling pressures and severe pulmonary hypertension, low normal to reduced CO.  Medical therapy recommended with aggressive diuresis and BiPAP as needed in setting of acute decompensated systolic heart failure as well as aggressive secondary prevention of CAD.  She was last seen by Dr. Izora Ribas on January 16, 2022.  She was doing well at that time.  She stated she saw [facility] once and was told she did not need to come back.  Was overall doing well from a cardiac perspective. Was started on Pletal and was instructed to take 3 days of extra Lasix. Was told to follow up in 6 months.   Today she presents for 37-month follow-up.  She states she is doing well.  She would like to establish care in [place], [place].  Since last office visit, she stated states she had some swelling, took some extra doses of Lasix and this seemed to help.  Denies any chest pain, however she does describe a "twinge" that is left-sided, not associated with exertion.  She thinks is related to the foods that she is eating as it is mainly noticed around nighttime, she also thinks is  related to the amount she is eating.  She states sometimes belching helps.  Not bothersome per her report.,  Also states moving her left shoulder and arm helps.  In the interim, she has dropped a bottle on her right lower shin, sees a wound clinic for this, and it is healing well.  Denies any shortness of breath, palpitations, syncope, presyncope, dizziness, orthopnea, PND, swelling, significant weight changes, acute bleeding, or claudication.  She shows me labs from June 2023 were overall stable.  Denies any other questions or concerns today.  Past Medical History:  Diagnosis Date   Anemia    Anxiety    Arthritis    Carotid artery disease (HCC)    a. Last carotid study in 2014 showed 1-39% RICA, 40-59% LICA.   Chronic combined systolic (congestive) and diastolic (congestive) heart failure (HCC)    a. EF 40-45% in 2014. b. EF 30-35% in 07/2017.   CKD (chronic kidney disease), stage III (HCC)    Coronary artery disease    a. s/p CABG 2014. b. Cath 07/2017 for NSTEMI -> med rx.   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     CARDIAC CATHETERIZATION     CARPAL TUNNEL RELEASE     CESAREAN SECTION  1983   CORONARY ARTERY BYPASS GRAFT N/A 04/11/2013   Procedure: CORONARY ARTERY BYPASS GRAFTING (CABG);  Surgeon: Alleen Borne, MD;  Location[facility];  Service: Open Heart Surgery;  Laterality: N/A;   EYE SURGERY Bilateral     laser   FRACTURE SURGERY Left 2005   RIGHT/LEFT HEART CATH AND CORONARY/GRAFT ANGIOGRAPHY N/A 08/06/2017   Procedure: RIGHT/LEFT HEART CATH AND CORONARY/GRAFT ANGIOGRAPHY;  Surgeon: Yvonne Kendall, MD;  Location: [facility];  Service: Cardiovascular;  Laterality: N/A;    Current Medications: Current Meds  Medication Sig   albuterol (VENTOLIN HFA) 108 (90 Base) MCG/ACT inhaler Inhale 2 puffs into the lungs every 4 (four) hours as needed.    aspirin EC 81 MG tablet Take 81 mg by mouth daily.    Cholecalciferol (VITAMIN D) 2000 units tablet Take 2,000 Units by mouth daily.   cilostazol (PLETAL) 100 MG tablet Take 1 tablet (100 mg total) by mouth 2 (two) times daily.   Coenzyme Q10 (COQ10) 100 MG CAPS Take 100 mg by mouth daily.    ezetimibe (ZETIA) 10 MG tablet TAKE 1 TABLET EVERY DAY   fluticasone (FLONASE) 50 MCG/ACT nasal spray Place 2 sprays into both nostrils daily as needed for allergies or rhinitis.   furosemide (LASIX) 40 MG tablet TAKE 1 TABLET TWICE DAILY (Patient taking differently: Take 20 mg by mouth daily. Extra tablet as needed)   gabapentin (NEURONTIN) 800 MG tablet Take 800 mg by mouth 3 (three) times daily.    insulin aspart (NOVOLOG) 100 UNIT/ML injection Inject into the skin See admin instructions. 1.25 Units 12 Am-7 Am,  1.75 u/hr 7 AM  Til 12 AM, Bolus 1 unit for every 15 carbs, 1 Unit for every 50 points greater than 150 Insulin pump   levocetirizine (XYZAL) 5 MG tablet Take 5 mg by mouth daily.    metoprolol succinate (TOPROL-XL) 25 MG 24 hr tablet TAKE 1 TABLET TWICE DAILY   omeprazole (PRILOSEC) 40 MG capsule TAKE 1 CAPSULE EVERY DAY   PARoxetine (PAXIL) 20 MG tablet Take 20 mg by mouth at bedtime.    polyethylene glycol (MIRALAX / GLYCOLAX) packet Take 17 g by mouth daily as needed for moderate constipation.    rosuvastatin (CRESTOR) 40 MG tablet TAKE ONE TABLET BY MOUTH ONCE A DAY. STOP LIPITOR   Turmeric 500 MG TABS Take 1 tablet by mouth daily.      Allergies:   Bactrim [sulfamethoxazole-trimethoprim], Codeine, Mobic [meloxicam], Morphine and related, Silvadene [silver sulfadiazine], and Adhesive [tape]   Social History   Socioeconomic History   Marital status: Married    Spouse name: Not on file   Number of children: Not on file   Years of education: Not on file   Highest education level: Not on file  Occupational History   Not on file  Tobacco Use   Smoking status: Former    Years: 25.00    Types: Cigarettes    Quit date: 05/30/2010    Years since quitting: 12.1   Smokeless tobacco:  Never   Tobacco comments:    STOPPED 2007  Vaping Use   Vaping Use: Never used  Substance and Sexual Activity   Alcohol use: No   Drug use: No   Sexual activity: Not on file  Other Topics Concern   Not on file  Social History Narrative   Not on file   Social Determinants of Health   Financial Resource Strain: Not on file  Food Insecurity: Not on file  Transportation Needs: Not on file  Physical Activity: Not on file  Stress: Not on file  Social Connections: Not on file     Family History: The patient's family history includes Heart attack in her brother and mother; Heart disease in her brother and mother; Hypertension in her brother and mother.  ROS:   Review of Systems  Constitutional: Negative.   HENT: Negative.    Eyes: Negative.   Respiratory: Negative.    Cardiovascular:  Positive for chest pain. Negative for palpitations, orthopnea, claudication, leg swelling and PND.       See HPI.   Gastrointestinal: Negative.   Genitourinary: Negative.   Musculoskeletal: Negative.   Skin: Negative.   Neurological: Negative.   Endo/Heme/Allergies: Negative.   Psychiatric/Behavioral: Negative.      Please see the history of present illness.    All other systems reviewed and are negative.  EKGs/Labs/Other Studies Reviewed:    The following studies were reviewed today:   EKG:  EKG is not ordered today.  EKG dated  01/16/22 reviewed and revealed SR, 63 bpm, nonspecific ST and T wave abnormality, otherwise nothing acute.   Carotid duplex on 04/12/22: Summary:  Right Carotid: Velocities in the right ICA are consistent with a 1-39%  stenosis.   Left Carotid: Velocities in the left ICA are consistent with a 1-39%  stenosis. Non-hemodynamically significant plaque <50% noted in the  CCA. The ECA appears >50% stenosed.   Vertebrals:  Bilateral vertebral arteries demonstrate antegrade flow.  Subclavians: Normal flow hemodynamics were seen in bilateral subclavian arteries.  ABI's with lower extremity arterial dopplers on 06/10/2020:  Summary:  Right: 50-74% stenosis noted in the superficial femoral artery. No  significant change compared to previous study.   Left: 50-74% stenosis noted in the superficial femoral artery. No  significant change as compared to previous study.  Summary:  Right: Resting right ankle-brachial index indicates mild right lower  extremity arterial disease. The right toe-brachial index is abnormal.   Left: Resting left ankle-brachial index indicates mild left lower  extremity arterial disease. The left toe-brachial index is abnormal.  2D Echocardiogram on 07/29/2019:  1. Left ventricular ejection fraction, by visual estimation, is 55 to  60%. The left ventricle has normal function. There is mildly increased  left ventricular hypertrophy.   2. Left ventricular diastolic parameters are indeterminate.   3. The left ventricle has no regional wall motion abnormalities.   4. Global right ventricle has normal systolic function.The right  ventricular size is normal. No increase in right ventricular wall  thickness.   5. Left atrial size was normal.   6. Right atrial size was normal.   7. Mild to moderate mitral annular calcification.   8. The mitral valve is grossly normal. Trivial mitral valve  regurgitation.   9. The tricuspid valve is grossly normal.  10. The aortic valve was  not well visualized. Aortic valve regurgitation  is trivial.  11. The pulmonic valve was grossly normal. Pulmonic valve regurgitation is  trivial.  12. TR signal is  inadequate for assessing pulmonary artery systolic  pressure.  13. The inferior vena cava is normal in size with greater than 50%  respiratory variability, suggesting right atrial pressure of 3 mmHg.  Abdominal aortogram on 03/06/2018: 1.  No significant aortic disease. 2.  Right lower extremity: Mild iliac disease with 30 to 40% disease in the common femoral and minor irregularities affecting the SFA and popliteal artery with three-vessel runoff below the knee. 3.  Left lower extremity: Moderate  common iliac artery calcified stenosis, mild common femoral artery disease, moderate distal SFA disease and three-vessel runoff below the knee.   Recommendations: There is no critical stenosis to explain the patient's claudication which is likely multifactorial including chronic systolic heart failure.  Recommend continuing medical therapy.  Right and left heart catheterization on 08/06/2017: Conclusions: Severe, diffuse native coronary artery disease. Widely patent LIMA-LAD with minimal runoff beyond the distal anastomosis. Patent but poorly visualized SVG-rPDA. Further attempts to engage SVG were not made due to declining respiratory status in the setting of severely elevated left and right heart pressures. Severely elevated left and right heart filling pressures. Severe pulmonary hypertension. Low normal to reduced cardiac output.   Recommendations: Transfer to cardiac ICU for aggressive diuresis and BiPAP (if needed) for shortness of breath in the setting of acute decompensated systolic heart failure. Optimize evidence-based heart failure therapy. If patient does not respond well to diuresis, consultation with advanced heart failure service will need to be considered. If patient develops chest pain or has continued dyspnea when  euvolemic, repeat catheterization to better visualize SVG->rPDA could be considered. Aggressive secondary prevention of CAD.    Recent Labs: No results found for requested labs within last 365 days.  Recent Lipid Panel    Component Value Date/Time   CHOL 183 08/22/2017 1448   TRIG 143 08/22/2017 1448   HDL 36 (L) 08/22/2017 1448   CHOLHDL 5.1 08/22/2017 1448   VLDL 29 08/22/2017 1448   LDLCALC 118 (H) 08/22/2017 1448    Physical Exam:    VS:  BP (!) 118/58   Pulse 86   Ht 5\' 3"  (1.6 m)   Wt 208 lb 6.4 oz (94.5 kg)   SpO2 98%   BMI 36.92 kg/m     Wt Readings from Last 3 Encounters:  07/17/22 208 lb 6.4 oz (94.5 kg)  01/16/22 214 lb 6.4 oz (97.3 kg)  10/15/20 220 lb (99.8 kg)     GEN: Obese, 71 y.o. female in no acute distress HEENT: Normal NECK: No JVD; No carotid bruits CARDIAC: S1/S2, RRR, no murmurs, rubs, gallops; 2+ peripheral pulses throughout, strong bilaterally RESPIRATORY:  Clear and diminished to auscultation without rales, wheezing or rhonchi  MUSCULOSKELETAL:  No edema; No deformity  SKIN: Warm and dry NEUROLOGIC:  Alert and oriented x 3 PSYCHIATRIC:  Normal affect   ASSESSMENT:    1. Coronary artery disease involving native heart with angina pectoris, unspecified vessel or lesion type (HCC)   2. Heart failure with improved ejection fraction (HFimpEF) (HCC)   3. Ischemic cardiomyopathy   4. Carotid stenosis, bilateral   5. OSA on CPAP   6. Chronic renal failure, stage 3a (HCC)    PLAN:    In order of problems listed above:  CAD, s/p NSTEMI, s/p CABG x 2 Atypical chest pain. Does not sound cardiac related and sounds most likely MSK or GI related.  Recommended Tylenol 1,000 mg twice daily.  Will continue current medical therapy for now and continue to watch and monitor  this.  Continue current medication regimen, including aspirin, Zetia, Toprol-XL, and rosuvastatin.  Heart healthy diet and regular cardiovascular exercise encouraged.   HFimpEF,  Ischemic Cardiomyopathy Echocardiogram performed in 2020 revealed EF of 55 to 60%. Euvolemic and well compensated on exam.  Continue current  medication regimen. Low sodium diet, fluid restriction <2L, and daily weights encouraged. Educated to contact our office for weight gain of 2 lbs overnight or 5 lbs in one week. Heart healthy diet and regular cardiovascular exercise encouraged.  Plan to update echocardiogram at next follow-up visit or sooner if clinically indicated.  Bilateral carotid disease Carotid Doppler performed in September 2023 revealed 1 to 39% stenosis along bilateral ICAs, stable findings as 1 year ago. Continue aspirin and rosuvastatin. Heart healthy diet and regular cardiovascular exercise encouraged.  Continue to follow-up with [facility].  OSA on CPAP Encouraged continued compliance.  PAD (has seen Dr. Kirke Corin) ABIs in 2021 revealed mild right and left lower extremity arterial disease with abnormal bilateral toe brachial index. Arterial Doppler of the lower extremities in 2021 revealed 50 to 74% stenosis in the right superficial femoral artery, 50 to 74% stenosis noted in the left superficial femoral artery, no significant change from previous study. Continue Pletal, started at previous OV. Continue f/u with [facility]. Will ask Dr. Kirke Corin regarding ASA with Pletal.   CKD stage 3a She showed me labs obtained from June 2023, stable overall. Continue to follow with PCP.  Avoid nephrotoxic agents.  7. Disposition: Requesting to establish care in [place], [place].  Establish care with Dr. Ancil Boozer in 3 months.      Medication Adjustments/Labs and Tests Ordered: Current medicines are reviewed at length with the patient today.  Concerns regarding medicines are outlined above.  No orders of the defined types were placed in this encounter.  No orders of the defined types were placed in this encounter.   Patient Instructions  Medication Instructions:  Your physician recommends that you  continue on your current medications as directed. Please refer to the Current Medication list given to you today.  Labwork: none  Testing/Procedures: none  Follow-Up: Your physician recommends that you schedule a follow-up appointment in: 3 months  Any Other Special Instructions Will Be Listed Below (If Applicable).  If you need a refill on your cardiac medications before your next appointment, please call your pharmacy.   Signed, Sharlene Dory, NP  07/17/2022 1:19 PM    [facility]

## 2022-07-17 ENCOUNTER — Encounter: Payer: Self-pay | Admitting: Nurse Practitioner

## 2022-07-17 ENCOUNTER — Ambulatory Visit: Payer: Medicare PPO | Attending: Nurse Practitioner | Admitting: Nurse Practitioner

## 2022-07-17 VITALS — BP 118/58 | HR 86 | Ht 63.0 in | Wt 208.4 lb

## 2022-07-17 DIAGNOSIS — I6523 Occlusion and stenosis of bilateral carotid arteries: Secondary | ICD-10-CM

## 2022-07-17 DIAGNOSIS — I255 Ischemic cardiomyopathy: Secondary | ICD-10-CM | POA: Diagnosis not present

## 2022-07-17 DIAGNOSIS — G4733 Obstructive sleep apnea (adult) (pediatric): Secondary | ICD-10-CM

## 2022-07-17 DIAGNOSIS — I5022 Chronic systolic (congestive) heart failure: Secondary | ICD-10-CM

## 2022-07-17 DIAGNOSIS — N1831 Chronic kidney disease, stage 3a: Secondary | ICD-10-CM

## 2022-07-17 DIAGNOSIS — I25119 Atherosclerotic heart disease of native coronary artery with unspecified angina pectoris: Secondary | ICD-10-CM

## 2022-07-17 NOTE — Patient Instructions (Addendum)
Medication Instructions:   Your physician recommends that you continue on your current medications as directed. Please refer to the Current Medication list given to you today.  Labwork:  none  Testing/Procedures:  none  Follow-Up:  Your physician recommends that you schedule a follow-up appointment in: 3 months.  Any Other Special Instructions Will Be Listed Below (If Applicable).  If you need a refill on your cardiac medications before your next appointment, please call your pharmacy. 

## 2022-08-07 LAB — HEMOGLOBIN A1C: Hemoglobin A1C: 12.8

## 2022-10-27 ENCOUNTER — Encounter: Payer: Self-pay | Admitting: Internal Medicine

## 2022-10-27 ENCOUNTER — Ambulatory Visit: Payer: Medicare HMO | Attending: Internal Medicine | Admitting: Internal Medicine

## 2022-10-27 VITALS — BP 120/66 | HR 83 | Ht 63.0 in | Wt 213.0 lb

## 2022-10-27 DIAGNOSIS — I509 Heart failure, unspecified: Secondary | ICD-10-CM | POA: Diagnosis not present

## 2022-10-27 DIAGNOSIS — I739 Peripheral vascular disease, unspecified: Secondary | ICD-10-CM

## 2022-10-27 DIAGNOSIS — I70213 Atherosclerosis of native arteries of extremities with intermittent claudication, bilateral legs: Secondary | ICD-10-CM | POA: Insufficient documentation

## 2022-10-27 DIAGNOSIS — I70219 Atherosclerosis of native arteries of extremities with intermittent claudication, unspecified extremity: Secondary | ICD-10-CM

## 2022-10-27 NOTE — Patient Instructions (Addendum)
Medication Instructions:  Continue all current medications.   Labwork: none  Testing/Procedures: Your physician has requested that you have an ankle brachial index (ABI). During this test an ultrasound and blood pressure cuff are used to evaluate the arteries that supply the arms and legs with blood. Allow thirty minutes for this exam. There are no restrictions or special instructions.   Follow-Up: 6 months   Any Other Special Instructions Will Be Listed Below (If Applicable). See Dr. Fletcher Anon next available for follow up.   You have been referred to:  SET (supervised exercise therapy)   If you need a refill on your cardiac medications before your next appointment, please call your pharmacy.

## 2022-10-27 NOTE — Progress Notes (Signed)
Cardiology Office Note  Date: 10/27/2022   ID: Kiara [name], DOB July 07, 1951, MRN [id number]  PCP:  Allie Dimmer, MD  Cardiologist:  Minus Breeding, MD Electrophysiologist:  None   Reason for Office Visit: CAD and PAD follow-up   History of Present Illness: Kiara [name] is a 72 y.o. female known to have CAD s/p CABG in 2014 with repeat LHC in 2019 showing patent LIMA to LAD and possibly patent SVG to RPDA with normal LVEF (30% to 55%), mild to moderate lower extremity PAD, carotid artery stenosis (123456 LICA, 1 to Q000111Q R ICA) type 1 diabetes mellitus, OSA on CPAP, CKD stage III AA presented to cardiology clinic for follow-up visit.  Patient was admitted with NSTEMI in 2019 and underwent LHC that showed patent LIMA to LAD and possibly patent SVG to RPDA. She was also noted to have severely elevated right and left heart pressures and severe pulmonary artery hypertension. Medical management was recommended at the time. Ultrasound arterial Doppler lower extremities showed 50 to 70% stenosis in bilateral superficial femoral arteries. Resting ABI of bilateral lower extremities indicated mild PAD in 2021. Ultrasound carotid Doppler bilateral showed 1-39% stenosis bilaterally. Patient presents for follow-up visit today.  She denies having any angina, has occasional twitching sensation in her chest but is able to perform her household chores and daily activities with no symptoms of angina. Denies any DOE, dizziness, lightheadedness, syncope. No palpitations. She has claudication in the left lower extremity more than the right (describes as fatigue/heaviness in her calfs and hamstrings). She is only able to walk 100-200 feet without having claudication. She never participated in a supervised exercise therapy for PAD. She was started on Pletal 100 mg twice daily in 2023 after which she noticed improvement in her symptoms but after a while, she said it stopped working. Quit smoking 12 years  ago.    Past Medical History:  Diagnosis Date   Anemia    Anxiety    Arthritis    Carotid artery disease ([place])    a. Last carotid study in 2014 showed 123456 RICA, 123456 LICA.   Chronic combined systolic (congestive) and diastolic (congestive) heart failure (HCC)    a. EF 40-45% in 2014. b. EF 30-35% in 07/2017.   CKD (chronic kidney disease), stage III ([place])    Coronary artery disease    a. s/p CABG 2014. b. Cath 07/2017 for NSTEMI -> med rx.   Diabetes mellitus ([place])    Diabetic peripheral neuropathy (HCC)    Eczema    GERD (gastroesophageal reflux disease)    H/O hiatal hernia    Hyperlipidemia    Hypertension    Myocardial infarction (HCC)    PONV (postoperative nausea and vomiting)    Sleep apnea    Umbilical hernia     Past Surgical History:  Procedure Laterality Date   ABDOMINAL AORTOGRAM W/LOWER EXTREMITY N/A 03/06/2018   Procedure: ABDOMINAL AORTOGRAM W/LOWER EXTREMITY;  Surgeon: Wellington Hampshire, MD;  Location: [facility];  Service: Cardiovascular;  Laterality: N/A;   ABDOMINAL HYSTERECTOMY     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     CARDIAC CATHETERIZATION     CARPAL TUNNEL RELEASE     CESAREAN SECTION  1983   CORONARY ARTERY BYPASS GRAFT N/A 04/11/2013   Procedure: CORONARY ARTERY BYPASS GRAFTING (CABG);  Surgeon: Gaye Pollack, MD;  Location: [facility];  Service: Open Heart Surgery;  Laterality: N/A;   EYE SURGERY Bilateral  laser   FRACTURE SURGERY Left 2005   RIGHT/LEFT HEART CATH AND CORONARY/GRAFT ANGIOGRAPHY N/A 08/06/2017   Procedure: RIGHT/LEFT HEART CATH AND CORONARY/GRAFT ANGIOGRAPHY;  Surgeon: Nelva Bush, MD;  Location: [facility];  Service: Cardiovascular;  Laterality: N/A;    Current Outpatient Medications  Medication Sig Dispense Refill   albuterol (VENTOLIN HFA) 108 (90 Base) MCG/ACT inhaler Inhale 2 puffs into the lungs every 4 (four) hours as needed.      aspirin EC 81 MG tablet Take 81 mg by mouth daily.       Cholecalciferol (VITAMIN D) 2000 units tablet Take 2,000 Units by mouth daily.     cilostazol (PLETAL) 100 MG tablet Take 1 tablet (100 mg total) by mouth 2 (two) times daily. 180 tablet 3   Coenzyme Q10 (COQ10) 100 MG CAPS Take 100 mg by mouth daily.      ezetimibe (ZETIA) 10 MG tablet TAKE 1 TABLET EVERY DAY 90 tablet 3   fluticasone (FLONASE) 50 MCG/ACT nasal spray Place 2 sprays into both nostrils daily as needed for allergies or rhinitis.     furosemide (LASIX) 40 MG tablet TAKE 1 TABLET TWICE DAILY (Patient taking differently: Take 20 mg by mouth daily. Extra tablet as needed) 180 tablet 2   gabapentin (NEURONTIN) 800 MG tablet Take 800 mg by mouth 3 (three) times daily.      insulin aspart (NOVOLOG) 100 UNIT/ML injection Inject into the skin See admin instructions. 1.25 Units 12 Am-7 Am,  1.75 u/hr 7 AM  Til 12 AM, Bolus 1 unit for every 15 carbs, 1 Unit for every 50 points greater than 150 Insulin pump     levocetirizine (XYZAL) 5 MG tablet Take 5 mg by mouth daily.      metoprolol succinate (TOPROL-XL) 25 MG 24 hr tablet TAKE 1 TABLET TWICE DAILY 180 tablet 3   omeprazole (PRILOSEC) 40 MG capsule TAKE 1 CAPSULE EVERY DAY     PARoxetine (PAXIL) 20 MG tablet Take 20 mg by mouth at bedtime.      polyethylene glycol (MIRALAX / GLYCOLAX) packet Take 17 g by mouth daily as needed for moderate constipation.      rosuvastatin (CRESTOR) 40 MG tablet TAKE ONE TABLET BY MOUTH ONCE A DAY. STOP LIPITOR 90 tablet 3   Turmeric 500 MG TABS Take 1 tablet by mouth daily.     No current facility-administered medications for this visit.   Allergies:  Bactrim [sulfamethoxazole-trimethoprim], Codeine, Mobic [meloxicam], Morphine and related, Silvadene [silver sulfadiazine], and Adhesive [tape]   Social History: The patient  reports that she quit smoking about 12 years ago. Her smoking use included cigarettes. She has never been exposed to tobacco smoke. She has never used smokeless tobacco. She reports that  she does not drink alcohol and does not use drugs.   Family History: The patient's family history includes Heart attack in her brother and mother; Heart disease in her brother and mother; Hypertension in her brother and mother.   ROS:  Please see the history of present illness. Otherwise, complete review of systems is positive for none.  All other systems are reviewed and negative.   Physical Exam: VS:  BP 120/66 (BP Location: Left Arm, Patient Position: Sitting, Cuff Size: Large)   Pulse 83   Ht 5\' 3"  (1.6 m)   Wt 213 lb (96.6 kg)   SpO2 96%   BMI 37.73 kg/m , BMI Body mass index is 37.73 kg/m.  Wt Readings from Last 3 Encounters:  10/27/22 213 lb (96.6 kg)  07/17/22 208 lb 6.4 oz (94.5 kg)  01/16/22 214 lb 6.4 oz (97.3 kg)    General: Patient appears comfortable at rest. HEENT: Conjunctiva and lids normal, oropharynx clear with moist mucosa. Neck: Supple, no elevated JVP or carotid bruits, no thyromegaly. Lungs: Clear to auscultation, nonlabored breathing at rest. Cardiac: Regular rate and rhythm, no S3 or significant systolic murmur, no pericardial rub. Abdomen: Soft, nontender, no hepatomegaly, bowel sounds present, no guarding or rebound. Extremities: No pitting edema, palpable DP and PT on right lower extremity, faint Doppler signal of DP and good PT signal on left lower extremity. Skin: Warm and dry. Musculoskeletal: No kyphosis. Neuropsychiatric: Alert and oriented x3, affect grossly appropriate.  ECG: NSR  Recent Labwork: No results found for requested labs within last 365 days.     Component Value Date/Time   CHOL 183 08/22/2017 1448   TRIG 143 08/22/2017 1448   HDL 36 (L) 08/22/2017 1448   CHOLHDL 5.1 08/22/2017 1448   VLDL 29 08/22/2017 1448   LDLCALC 118 (H) 08/22/2017 1448    Other Studies Reviewed Today:   Assessment and Plan: Patient is a 72 year old F known to have CAD s/p CABG in 2014 with repeat LHC in 2019 showing patent LIMA to LAD and possibly  patent SVG to RPDA with normal LVEF (30% to 55%), mild to moderate lower extremity PAD, carotid artery stenosis (123456 LICA, 1 to Q000111Q R ICA) type 1 diabetes mellitus, OSA on CPAP, CKD stage III AA presented to cardiology clinic for follow-up visit.  # CAD s/p CABG in 2014 with repeat LHC in 2019 showing patent LIMA to LAD and possibly patent SVG to RPDA with normal LVEF (30% to 55%), currently angina free -Continue aspirin 81 mg once daily, rosuvastatin 40 mg nightly and Zetia 10 mg once daily -Continue metoprolol succinate 25 mg once daily -Not a candidate for SGLT2 inhibitors due to DM1 -ER precautions for chest pain provided  # Mild PAD of bilateral lower extremities with 50 to 74% stenosis of bilateral SFA # Abnormal TBI bilaterally -Patient can walk only 100 to 200 feet before developing claudication symptoms bilaterally, L>R. Physical examination showed palpable pulses on the right lower extremity and faint Doppler signal on the left lower extremity DP and good signal on the PT. Pletal initially worked but not helping anymore. ABIs from 2021 showed mild PAD. Will update ABIs. Patient will benefit from supervised exercise therapy of PAD. I discussed the risk factors, pathophysiology of PAD and benefits of supervised exercise therapy for PAD which include decrease in claudication symptoms, increase in ambulation distance and improvement of quality of life. Patient voiced understanding and is agreeable to be referred to program. Placed referral to supervised exercise therapy for PAD. -Continue cardio prudent medications, aspirin 81 mg once daily, rosuvastatin 40 mg nightly and Zetia 10 mg once daily. -Patient quit smoking 12 years ago. -Continue Pletal 100 mg twice daily  # HLD: Continue rosuvastatin 40 mg nightly and Zetia 10 mg once daily. Goal LDL<55.  I have spent a total of 30 minutes with patient reviewing chart, EKGs, labs and examining patient as well as establishing an assessment and  plan that was discussed with the patient.  > 50% of time was spent in direct patient care.      Medication Adjustments/Labs and Tests Ordered: Current medicines are reviewed at length with the patient today.  Concerns regarding medicines are outlined above.   Tests Ordered: Orders Placed This Encounter  Procedures   AMB referral for PAD/Supervised Exercise Therapy (SET)   EKG 12-Lead   VAS [place] ABI WITH/WO TBI    Medication Changes: No orders of the defined types were placed in this encounter.   Disposition:  Follow up  6 months  Signed Brylan Dec Fidel Levy, MD, 10/27/2022 2:19 PM    [facility], [place], [place] [address]

## 2022-11-15 ENCOUNTER — Encounter: Payer: Self-pay | Admitting: *Deleted

## 2022-11-15 NOTE — Progress Notes (Signed)
Fax notification received from [employer] that cilostazol 100 mg BID approved from 11/15/2022 through 07/31/2023.   Prior Auth done through [facility] [employer] (Key: [id number]) PA Case ID #: [id number] Rx #: [id number] Need Help? Call us at [phone] Outcome N/A today Unable to process transaction. Please resubmit. 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

## 2022-11-23 ENCOUNTER — Ambulatory Visit: Payer: Medicare HMO | Attending: Internal Medicine

## 2022-11-23 DIAGNOSIS — I739 Peripheral vascular disease, unspecified: Secondary | ICD-10-CM

## 2022-11-24 LAB — VAS US ABI WITH/WO TBI
Left ABI: 0.6
Right ABI: 0.85

## 2022-12-05 ENCOUNTER — Ambulatory Visit: Payer: Medicare HMO | Admitting: Cardiovascular Disease

## 2022-12-26 ENCOUNTER — Ambulatory Visit: Payer: Medicare HMO | Attending: Cardiovascular Disease | Admitting: Cardiovascular Disease

## 2022-12-26 ENCOUNTER — Encounter: Payer: Self-pay | Admitting: Cardiovascular Disease

## 2022-12-26 VITALS — BP 128/62 | HR 71 | Ht 63.5 in | Wt 205.8 lb

## 2022-12-26 DIAGNOSIS — I6521 Occlusion and stenosis of right carotid artery: Secondary | ICD-10-CM | POA: Diagnosis not present

## 2022-12-26 DIAGNOSIS — I2510 Atherosclerotic heart disease of native coronary artery without angina pectoris: Secondary | ICD-10-CM | POA: Diagnosis not present

## 2022-12-26 DIAGNOSIS — E785 Hyperlipidemia, unspecified: Secondary | ICD-10-CM

## 2022-12-26 DIAGNOSIS — I739 Peripheral vascular disease, unspecified: Secondary | ICD-10-CM | POA: Diagnosis not present

## 2022-12-26 NOTE — Progress Notes (Signed)
Cardiology Office Note   Date:  12/26/2022   ID:  Kiara [name], DOB 1951/07/16, MRN [id number]  PCP:  Majel Homer, MD  Cardiologist: Dr. Antoine Poche.  No chief complaint on file.     History of Present Illness: Kiara [name] is a 72 y.o. female who is here today for follow-up visit regarding peripheral arterial disease . She has known history of coronary artery disease status post CABG, chronic systolic heart failure, diabetes mellitus, hyperlipidemia and diabetes mellitus.  She is been diabetic for 60 years.  She had amputation of the right lateral toes in 2006. She had a non-ST elevation myocardial infarction in January of 2019.  Cardiac catheterization via the right femoral artery showed severe native vessel coronary artery disease with patent LIMA to LAD and patent SVG to right PDA.  Filling pressures were severely elevated with severe pulmonary hypertension.  Medical therapy was recommended.   She was seen in 2019 for severe bilateral leg claudication. Vascular studies showed an ABI of 0.92 on the right and 0.93 on the left.  Toe pressure was significantly abnormal bilaterally.    Duplex showed moderate bilateral common femoral artery and SFA disease. CO2 angiography was performed in August 2019 which showed mild disease affecting the right common femoral and SFA disease with three-vessel runoff below the knee.  In the left, there was moderate calcified common iliac artery stenosis and moderate distal SFA disease.  The disease was not significant enough to explain severity of her symptoms.  She reports worsening bilateral calf claudication which is worse on the left side.  It starts in the hip area and goes down.  She has been under significant stress lately.  Her husband died 6 weeks ago unexpectedly. Her claudication is definitely worse with minimal activities.  At the same time, she reports lower back pain with certain positions.  She had a recent ABI which showed a drop on  the left side to 0.60.  It was previously 0.88.  She is currently on cilostazol but reports no significant improvement in symptoms.  Past Medical History:  Diagnosis Date   Anemia    Anxiety    Arthritis    Carotid artery disease (HCC)    a. Last carotid study in 2014 showed 1-39% RICA, 40-59% LICA.   Chronic combined systolic (congestive) and diastolic (congestive) heart failure (HCC)    a. EF 40-45% in 2014. b. EF 30-35% in 07/2017.   CKD (chronic kidney disease), stage III (HCC)    Coronary artery disease    a. s/p CABG 2014. b. Cath 07/2017 for NSTEMI -> med rx.   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     CARDIAC CATHETERIZATION     CARPAL TUNNEL RELEASE     CESAREAN SECTION  1983   CORONARY ARTERY BYPASS GRAFT N/A 04/11/2013  Procedure: CORONARY ARTERY BYPASS GRAFTING (CABG);  Surgeon: Alleen Borne, MD;  Location: [facility];  Service: Open Heart Surgery;  Laterality: N/A;   EYE SURGERY Bilateral    laser   FRACTURE SURGERY Left 2005   RIGHT/LEFT HEART CATH AND CORONARY/GRAFT ANGIOGRAPHY N/A 08/06/2017   Procedure: RIGHT/LEFT HEART CATH AND CORONARY/GRAFT ANGIOGRAPHY;  Surgeon: Yvonne Kendall, MD;  Location: [facility];  Service: Cardiovascular;  Laterality: N/A;     Current Outpatient Medications  Medication Sig Dispense Refill   albuterol (VENTOLIN HFA) 108 (90 Base) MCG/ACT inhaler Inhale 2  puffs into the lungs every 4 (four) hours as needed.      aspirin EC 81 MG tablet Take 81 mg by mouth daily.      Cholecalciferol (VITAMIN D) 2000 units tablet Take 2,000 Units by mouth daily.     cilostazol (PLETAL) 100 MG tablet Take 1 tablet (100 mg total) by mouth 2 (two) times daily. 180 tablet 3   Coenzyme Q10 (COQ10) 100 MG CAPS Take 100 mg by mouth daily.      ezetimibe (ZETIA) 10 MG tablet TAKE 1 TABLET EVERY DAY 90 tablet 3   fluticasone (FLONASE) 50 MCG/ACT nasal spray Place 2 sprays into both nostrils daily as needed for allergies or rhinitis.     furosemide (LASIX) 40 MG tablet TAKE 1 TABLET TWICE DAILY (Patient taking differently: Take 20 mg by mouth daily. Extra tablet as needed) 180 tablet 2   gabapentin (NEURONTIN) 800 MG tablet Take 800 mg by mouth 3 (three) times daily.      insulin aspart (NOVOLOG) 100 UNIT/ML injection Inject into the skin See admin instructions. 1.25 Units 12 Am-7 Am,  1.75 u/hr 7 AM  Til 12 AM, Bolus 1 unit for every 15 carbs, 1 Unit for every 50 points greater than 150 Insulin pump     levocetirizine (XYZAL) 5 MG tablet Take 5 mg by mouth daily.      metoprolol succinate (TOPROL-XL) 25 MG 24 hr tablet TAKE 1 TABLET TWICE DAILY 180 tablet 3   montelukast (SINGULAIR) 10 MG tablet Take 10 mg by mouth daily.     omeprazole (PRILOSEC) 40 MG capsule TAKE 1 CAPSULE EVERY DAY     PARoxetine (PAXIL) 20 MG tablet Take 20 mg by mouth at bedtime.      polyethylene glycol (MIRALAX / GLYCOLAX) packet Take 17 g by mouth daily as needed for moderate constipation.      rosuvastatin (CRESTOR) 40 MG tablet TAKE ONE TABLET BY MOUTH ONCE A DAY. STOP LIPITOR 90 tablet 3   Turmeric 500 MG TABS Take 1 tablet by mouth daily.     No current facility-administered medications for this visit.    Allergies:   Bactrim [sulfamethoxazole-trimethoprim], Codeine, Mobic [meloxicam], Morphine and codeine, Silvadene [silver sulfadiazine], and Adhesive [tape]    Social History:  The  patient  reports that she quit smoking about 12 years ago. Her smoking use included cigarettes. She has never been exposed to tobacco smoke. She has never used smokeless tobacco. She reports that she does not drink alcohol and does not use drugs.   Family History:  The patient's family history includes Heart attack in her brother and mother; Heart disease in her brother and mother; Hypertension in her brother and mother.    ROS:  Please see the history of present illness.   Otherwise, review of systems are positive for none.   All other systems are reviewed and negative.    PHYSICAL EXAM:  VS:  BP 128/62 (BP Location: Left Arm, Patient Position: Sitting, Cuff Size: Normal)   Pulse 71   Ht 5' 3.5" (1.613 m)   Wt 205 lb 12.8 oz (93.4 kg)   SpO2 94%   BMI 35.88 kg/m  , BMI Body mass index is 35.88 kg/m. GEN: Well nourished, well developed, in no acute distress  HEENT: normal  Neck: no JVD, carotid bruits, or masses Cardiac: RRR; no murmurs, rubs, or gallops,no edema  Respiratory:  clear to auscultation bilaterally, normal work of breathing GI: soft, nontender, nondistended, + BS MS: no deformity or atrophy  Skin: warm and dry, no rash Neuro:  Strength and sensation are intact Psych: euthymic mood, full affect Vascular: Femoral pulses +1 on the right and barely palpable on the left.  Distal pulses are not palpable.   EKG:  EKG is not ordered today.    Recent Labs: No results found for requested labs within last 365 days.    Lipid Panel    Component Value Date/Time   CHOL 183 08/22/2017 1448   TRIG 143 08/22/2017 1448   HDL 36 (L) 08/22/2017 1448   CHOLHDL 5.1 08/22/2017 1448   VLDL 29 08/22/2017 1448   LDLCALC 118 (H) 08/22/2017 1448      Wt Readings from Last 3 Encounters:  12/26/22 205 lb 12.8 oz (93.4 kg)  10/27/22 213 lb (96.6 kg)  07/17/22 208 lb 6.4 oz (94.5 kg)           No data to display            ASSESSMENT AND PLAN:  1.  Peripheral arterial  disease: Angiography in 2019 showed mild to moderate nonobstructive peripheral arterial disease.  She has worsening claudication on the left side and her ABI dropped to 0.6.  By physical exam, her femoral pulses are very diminished especially on the left side.  I wonder if her iliac disease has worsened.  This could potentially explain some of her lower back and hip discomfort.  However, she symptoms that are suggestive of a lumbar spine etiology.  She wants her back evaluated with imaging before considering angiography. I stressed that she will require abdominal aortogram with lower extremity angiography in the near future. We will have her follow-up in 3 months.  2.  Coronary artery disease involving native coronary arteries: Symptoms are well controlled at the present time.  3.  Chronic systolic heart failure: She appears to be euvolemic currently on optimal medical therapy.  4.  Hyperlipidemia: Currently on rosuvastatin and Zetia.  Most recent lipid profile in January showed an LDL of 60.  5.  Carotid disease: This was mild on most recent carotid Doppler.   Disposition:   FU with me in 3 months  Signed,  Lorine Bears, MD  12/26/2022 5:34 PM    [facility]

## 2022-12-26 NOTE — Patient Instructions (Signed)
Medication Instructions:  No changes *If you need a refill on your cardiac medications before your next appointment, please call your pharmacy*   Lab Work: None ordered If you have labs (blood work) drawn today and your tests are completely normal, you will receive your results only by: MyChart Message (if you have MyChart) OR A paper copy in the mail If you have any lab test that is abnormal or we need to change your treatment, we will call you to review the results.   Testing/Procedures: None ordered   Follow-Up: At [facility], you and your health needs are our priority.  As part of our continuing mission to provide you with exceptional heart care, we have created designated Provider Care Teams.  These Care Teams include your primary Cardiologist (physician) and Advanced Practice Providers (APPs -  Physician Assistants and Nurse Practitioners) who all work together to provide you with the care you need, when you need it.  We recommend signing up for the patient portal called "MyChart".  Sign up information is provided on this After Visit Summary.  MyChart is used to connect with patients for Virtual Visits (Telemedicine).  Patients are able to view lab/test results, encounter notes, upcoming appointments, etc.  Non-urgent messages can be sent to your provider as well.   To learn more about what you can do with MyChart, go to [email or web address].    Your next appointment:   3 month(s)  Dr. Kirke Corin

## 2023-01-10 LAB — HEMOGLOBIN A1C: Hemoglobin A1C: 11.8

## 2023-01-18 ENCOUNTER — Other Ambulatory Visit: Payer: Self-pay | Admitting: Cardiology

## 2023-01-24 ENCOUNTER — Other Ambulatory Visit: Payer: Self-pay | Admitting: Internal Medicine

## 2023-03-11 ENCOUNTER — Other Ambulatory Visit: Payer: Self-pay | Admitting: Cardiology

## 2023-03-13 ENCOUNTER — Ambulatory Visit: Payer: Medicare HMO | Admitting: Cardiovascular Disease

## 2023-04-01 ENCOUNTER — Other Ambulatory Visit: Payer: Self-pay | Admitting: Cardiology

## 2023-04-23 ENCOUNTER — Ambulatory Visit: Payer: Medicare HMO | Attending: Internal Medicine | Admitting: Internal Medicine

## 2023-04-23 ENCOUNTER — Encounter: Payer: Self-pay | Admitting: Internal Medicine

## 2023-04-23 VITALS — BP 94/60 | HR 77 | Ht 63.0 in | Wt 208.0 lb

## 2023-04-23 DIAGNOSIS — I2510 Atherosclerotic heart disease of native coronary artery without angina pectoris: Secondary | ICD-10-CM | POA: Diagnosis not present

## 2023-04-23 NOTE — Progress Notes (Signed)
Cardiology Office Note  Date: 04/23/2023   ID: Kiara [name], DOB [birth year], MRN [id number]  PCP:  Majel Homer, MD  Cardiologist:  Marjo Bicker, MD Electrophysiologist:  None    History of Present Illness: Kiara [name] is a 72 y.o. female known to have CAD s/p CABG in 2014 with repeat LHC in 2019 showing patent LIMA to LAD and possibly patent SVG to RPDA with normal LVEF, mild to moderate lower extremity PAD (R ABI 0.85, L ABI 0.60 in 2024), carotid artery stenosis (1-39% LICA, 1 to 39% R ICA) type 1 diabetes mellitus, OSA on CPAP, CKD stage III AA presented to cardiology clinic for follow-up visit.  Patient was admitted with NSTEMI in 2019 and underwent LHC that showed patent LIMA to LAD and possibly patent SVG to RPDA. She was also noted to have severely elevated right and left heart pressures and severe pulmonary artery hypertension. Medical management was recommended at the time. Ultrasound arterial Doppler lower extremities showed 50 to 70% stenosis in bilateral superficial femoral arteries. Resting ABI of bilateral lower extremities indicated mild PAD in 2021, mild RLE PAD and moderate LLE PAD in 2024. Ultrasound carotid Doppler bilateral showed 1-39% stenosis bilaterally. Patient presents for follow-up visit today.   Supervised exercise therapy referral was placed at last clinic visit but patient wanted to defer.  She continues to have pain in her left buttocks and left groin with minimal exertion, like walking for few steps and also with rest.  She has upcoming appointment with Dr. Kirke Corin on 05/01/2023.  No spine pain.  No angina, dizziness, presyncope and syncope.  No leg swelling.  No DOE, orthopnea, PND although she does have mild SOB.  Quit smoking in 12 years ago.    Past Medical History:  Diagnosis Date   Anemia    Anxiety    Arthritis    Carotid artery disease (HCC)    a. Last carotid study in 2014 showed 1-39% RICA, 40-59% LICA.   Chronic combined  systolic (congestive) and diastolic (congestive) heart failure (HCC)    a. EF 40-45% in 2014. b. EF 30-35% in 07/2017.   CKD (chronic kidney disease), stage III (HCC)    Coronary artery disease    a. s/p CABG 2014. b. Cath 07/2017 for NSTEMI -> med rx.   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     CARDIAC CATHETERIZATION     CARPAL TUNNEL RELEASE     CESAREAN SECTION  1983   CORONARY ARTERY BYPASS GRAFT N/A 04/11/2013   Procedure: CORONARY ARTERY BYPASS GRAFTING (CABG);  Surgeon: Alleen Borne, MD;  Location: [facility];  Service: Open Heart Surgery;  Laterality: N/A;   EYE SURGERY Bilateral    laser   FRACTURE SURGERY Left 2005   RIGHT/LEFT HEART CATH AND CORONARY/GRAFT ANGIOGRAPHY N/A 08/06/2017   Procedure:  RIGHT/LEFT HEART CATH AND CORONARY/GRAFT ANGIOGRAPHY;  Surgeon: Yvonne Kendall, MD;  [facility]: [facility];  Service: Cardiovascular;  Laterality: N/A;    Current Outpatient Medications  Medication Sig Dispense Refill   albuterol (VENTOLIN HFA) 108 (90 Base) MCG/ACT inhaler Inhale 2 puffs into the lungs every 4 (four) hours as needed.      aspirin EC 81 MG tablet Take 81 mg by mouth daily.      Cholecalciferol (VITAMIN D) 2000 units tablet Take 2,000 Units by mouth daily.     cilostazol (PLETAL) 100 MG tablet TAKE 1 TABLET TWICE DAILY 180 tablet 3   Coenzyme Q10 (COQ10) 100 MG CAPS Take 100 mg by mouth  daily.      DULoxetine (CYMBALTA) 60 MG capsule Take 60 mg by mouth daily.     ezetimibe (ZETIA) 10 MG tablet TAKE 1 TABLET EVERY DAY 90 tablet 3   fluticasone (FLONASE) 50 MCG/ACT nasal spray Place 2 sprays into both nostrils daily as needed for allergies or rhinitis.     furosemide (LASIX) 40 MG tablet TAKE 1 TABLET TWICE DAILY (Patient taking differently: Take 20 mg by mouth daily. Extra tablet as needed) 180 tablet 2   gabapentin (NEURONTIN) 800 MG tablet Take 800 mg by mouth 3 (three) times daily.      insulin aspart (NOVOLOG) 100 UNIT/ML injection Inject into the skin See admin instructions. 1.25 Units 12 Am-7 Am,  1.75 u/hr 7 AM  Til 12 AM, Bolus 1 unit for every 15 carbs, 1 Unit for every 50 points greater than 150 Insulin pump     levocetirizine (XYZAL) 5 MG tablet Take 5 mg by mouth daily.      metoprolol succinate (TOPROL-XL) 25 MG 24 hr tablet TAKE 1 TABLET TWICE DAILY 180 tablet 3   montelukast (SINGULAIR) 10 MG tablet Take 10 mg by mouth daily.     omeprazole (PRILOSEC) 40 MG capsule TAKE 1 CAPSULE EVERY DAY     polyethylene glycol (MIRALAX / GLYCOLAX) packet Take 17 g by mouth daily as needed for moderate constipation.      rosuvastatin (CRESTOR) 40 MG tablet TAKE ONE TABLET BY MOUTH ONE TIME A DAY. STOP LIPITOR 90 tablet 3   Turmeric 500 MG TABS Take 1 tablet by mouth daily.     No current facility-administered medications for this visit.   Allergies:  Bactrim [sulfamethoxazole-trimethoprim], Codeine, Mobic [meloxicam], Morphine and codeine, Silvadene [silver sulfadiazine], and Adhesive [tape]   Social History: The patient  reports that she quit smoking about 12 years ago. Her smoking use included cigarettes. She started smoking about 37 years ago. She has never been exposed to tobacco smoke. She has never used smokeless tobacco. She reports that she does not drink alcohol and does not use drugs.   Family History: The patient's family history includes Heart attack in her brother  and mother; Heart disease in her brother and mother; Hypertension in her brother and mother.   ROS:  Please see the history of present illness. Otherwise, complete review of systems is positive for none.  All other systems are reviewed and negative.   Physical Exam: VS:  BP 94/60   Pulse 77   Ht 5\' 3"  (1.6 m)   Wt 208 lb (94.3 kg)   SpO2 97%   BMI 36.85 kg/m , BMI Body mass index is 36.85 kg/m.  Wt Readings from Last 3 Encounters:  04/23/23 208 lb (94.3 kg)  12/26/22 205 lb 12.8 oz (93.4 kg)  10/27/22 213  lb (96.6 kg)    General: Patient appears comfortable at rest. HEENT: Conjunctiva and lids normal, oropharynx clear with moist mucosa. Neck: Supple, no elevated JVP or carotid bruits, no thyromegaly. Lungs: Clear to auscultation, nonlabored breathing at rest. Cardiac: Regular rate and rhythm, no S3 or significant systolic murmur, no pericardial rub. Abdomen: Soft, nontender, no hepatomegaly, bowel sounds present, no guarding or rebound. Extremities: No pitting edema, palpable DP and PT on right lower extremity, faint Doppler signal of DP and good PT signal on left lower extremity. Skin: Warm and dry. Musculoskeletal: No kyphosis. Neuropsychiatric: Alert and oriented x3, affect grossly appropriate.  ECG: NSR  Recent Labwork: No results found for requested labs within last 365 days.     Component Value Date/Time   CHOL 183 08/22/2017 1448   TRIG 143 08/22/2017 1448   HDL 36 (L) 08/22/2017 1448   CHOLHDL 5.1 08/22/2017 1448   VLDL 29 08/22/2017 1448   LDLCALC 118 (H) 08/22/2017 1448    Other Studies Reviewed Today:   Assessment and Plan: Patient is a 72 year old F known to have CAD s/p CABG in 2014 with repeat LHC in 2019 showing patent LIMA to LAD and possibly patent SVG to RPDA with normal LVEF,  mild to moderate lower extremity PAD, carotid artery stenosis (1-39% LICA, 1 to 39% R ICA) type 1 diabetes mellitus, OSA on CPAP, CKD stage III AA presented to cardiology  clinic for follow-up visit.  CAD s/p CABG in 2014 with normal LVEF, angina free (LHC in 2019 showed patent LIMA to LAD and possible patent SVG to RPDA) Lower extremity PAD (R ABI 0.85 and L ABI 0.60, abdomen TBI's) in 2024 HLD, at goal Type 1 diabetes mellitus  -Continue cardioprotective medications, aspirin 81 mg once daily, rosuvastatin 40 mg nightly and Zetia 10 mg once daily.  Goal LDL less than 55, LDL at goal.  Repeat lipid panel from 2024 from her PCPs office. Not a candidate for SGLT2 inhibitors due to DM 1.  Continue metoprolol succinate 25 mg once daily. -Patient has progressively worsening claudication pain in her left buttocks and left groin with minimal exertion and now with rest. She still has right lower EXTR claudication but not as bad as the left leg. She has an upcoming appointment with Dr. Kirke Corin, needs to be scheduled for abdominal aortogram with possible angioplasty/stenting. I do not think her pain is from her spine, rather it is from PAD.  Exam showed dopplerable PT and DP on both feet although right foot has stronger pulses compared to the left foot. No discoloration or gangrene noted. Continue cardioprotective medications as stated above and Pletal 100 mg twice daily. Quit smoking around 12 years ago. She will benefit from supervised exercise therapy for right lower EXTR claudication after revascularization of the left lower extremity. -Follow-up with PCP for management of DM1.   Disposition:  Follow up  6 months  Signed Kollins Fenter Verne Spurr, MD, 04/23/2023 8:54 AM    [facility] at [facility] [address] [place], [place], [place] [address]

## 2023-04-23 NOTE — Patient Instructions (Addendum)
  Medication Instructions:  Your physician recommends that you continue on your current medications as directed. Please refer to the Current Medication list given to you today.  *If you need a refill on your cardiac medications before your next appointment, please call your pharmacy*   Lab Work: None If you have labs (blood work) drawn today and your tests are completely normal, you will receive your results only by: MyChart Message (if you have MyChart) OR A paper copy in the mail If you have any lab test that is abnormal or we need to change your treatment, we will call you to review the results.   Testing/Procedures: None   Follow-Up: At [facility], you and your health needs are our priority.  As part of our continuing mission to provide you with exceptional heart care, we have created designated Provider Care Teams.  These Care Teams include your primary Cardiologist (physician) and Advanced Practice Providers (APPs -  Physician Assistants and Nurse Practitioners) who all work together to provide you with the care you need, when you need it.  We recommend signing up for the patient portal called "MyChart".  Sign up information is provided on this After Visit Summary.  MyChart is used to connect with patients for Virtual Visits (Telemedicine).  Patients are able to view lab/test results, encounter notes, upcoming appointments, etc.  Non-urgent messages can be sent to your provider as well.   To learn more about what you can do with MyChart, go to [email or web address].    Your next appointment:   6 month(s)  Provider:   You may see Vishnu P Mallipeddi, MD or one of the following Advanced Practice Providers on your designated Care Team:   Randall An, PA-C  Jacolyn Reedy, PA-C     Other Instructions Thank you for choosing [facility]!

## 2023-04-27 ENCOUNTER — Other Ambulatory Visit: Payer: Self-pay | Admitting: Neurological Surgery

## 2023-04-27 DIAGNOSIS — M5416 Radiculopathy, lumbar region: Secondary | ICD-10-CM

## 2023-05-01 ENCOUNTER — Encounter: Payer: Self-pay | Admitting: Cardiovascular Disease

## 2023-05-01 ENCOUNTER — Ambulatory Visit: Payer: Medicare HMO | Attending: Cardiovascular Disease | Admitting: Cardiovascular Disease

## 2023-05-01 VITALS — BP 124/68 | HR 82 | Ht 63.0 in | Wt 206.0 lb

## 2023-05-01 DIAGNOSIS — I6523 Occlusion and stenosis of bilateral carotid arteries: Secondary | ICD-10-CM

## 2023-05-01 DIAGNOSIS — I739 Peripheral vascular disease, unspecified: Secondary | ICD-10-CM | POA: Diagnosis not present

## 2023-05-01 DIAGNOSIS — I5022 Chronic systolic (congestive) heart failure: Secondary | ICD-10-CM

## 2023-05-01 DIAGNOSIS — I2510 Atherosclerotic heart disease of native coronary artery without angina pectoris: Secondary | ICD-10-CM | POA: Diagnosis not present

## 2023-05-01 DIAGNOSIS — E785 Hyperlipidemia, unspecified: Secondary | ICD-10-CM | POA: Diagnosis not present

## 2023-05-01 NOTE — H&P (View-Only) (Signed)
Cardiology Office Note   Date:  05/02/2023   ID:  Kiara [name], DOB 03/14/1951, MRN [id number]  PCP:  Kiara Homer, MD  Cardiologist: Dr. Jenene Slicker  No chief complaint on file.     History of Present Illness: Kiara [name] is a 72 y.o. female who is here today for follow-up visit regarding peripheral arterial disease . She has known history of coronary artery disease status post CABG, chronic systolic heart failure, diabetes mellitus, hyperlipidemia and diabetes mellitus.  She is been diabetic for 60 years.  She had amputation of the right lateral toes in 2006. She had a non-ST elevation myocardial infarction in January of 2019.  Cardiac catheterization via the right femoral artery showed severe native vessel coronary artery disease with patent LIMA to LAD and patent SVG to right PDA.  Filling pressures were severely elevated with severe pulmonary hypertension.  Medical therapy was recommended.   She was seen in 2019 for severe bilateral leg claudication. Vascular studies showed an ABI of 0.92 on the right and 0.93 on the left.  Toe pressure was significantly abnormal bilaterally.    Duplex showed moderate bilateral common femoral artery and SFA disease. CO2 angiography was performed in August 2019 which showed mild disease affecting the right common femoral and SFA disease with three-vessel runoff below the knee.  In the left, there was moderate calcified common iliac artery stenosis and moderate distal SFA disease.  The disease was not significant enough to explain severity of her symptoms.  Most recent vascular testing in April 2024 showed  a drop on the left side to 0.60.  It was previously 0.88.   Her symptoms include severe low back pain radiating to the left hip and down to the thigh and calf.  This has progressed significantly since last visit and she had no improvement with cilostazol.  In addition, she is known to have lumbar spine disease and was seen by Dr. Danielle Dess with  plans for MRI of the spine.  Past Medical History:  Diagnosis Date   Anemia    Anxiety    Arthritis    Carotid artery disease (HCC)    a. Last carotid study in 2014 showed 1-39% RICA, 40-59% LICA.   Chronic combined systolic (congestive) and diastolic (congestive) heart failure (HCC)    a. EF 40-45% in 2014. b. EF 30-35% in 07/2017.   CKD (chronic kidney disease), stage III (HCC)    Coronary artery disease    a. s/p CABG 2014. b. Cath 07/2017 for NSTEMI -> med rx.   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     CARDIAC CATHETERIZATION     CARPAL TUNNEL RELEASE     CESAREAN SECTION  1983   CORONARY ARTERY BYPASS GRAFT N/A 04/11/2013   Procedure: CORONARY ARTERY BYPASS GRAFTING (CABG);  Surgeon: Alleen Borne, MD;  Location: [facility] OR;  Service: Open Heart Surgery;  Laterality: N/A;   EYE SURGERY Bilateral    laser   FRACTURE SURGERY Left 2005   RIGHT/LEFT HEART CATH AND CORONARY/GRAFT ANGIOGRAPHY N/A 08/06/2017   Procedure: RIGHT/LEFT HEART CATH AND CORONARY/GRAFT ANGIOGRAPHY;  Surgeon: Yvonne Kendall, MD;  Location: [facility];  Service: Cardiovascular;  Laterality: N/A;     Current Outpatient Medications  Medication Sig Dispense Refill   albuterol (VENTOLIN HFA) 108 (90 Base) MCG/ACT inhaler Inhale 2 puffs into the lungs every 4 (four) hours as needed.      aspirin EC 81 MG tablet Take 81 mg by mouth  daily.      Cholecalciferol (VITAMIN D) 2000 units tablet Take 2,000 Units by mouth daily.     cilostazol (PLETAL) 100 MG tablet TAKE 1 TABLET TWICE DAILY 180 tablet 3   Coenzyme Q10 (COQ10) 100 MG CAPS Take 100 mg by mouth daily.      DULoxetine (CYMBALTA) 60 MG capsule Take 60 mg by mouth daily.     ezetimibe (ZETIA) 10 MG tablet TAKE 1 TABLET EVERY DAY 90 tablet 3   fluticasone (FLONASE) 50 MCG/ACT nasal spray Place 2 sprays into both nostrils daily as needed for allergies or rhinitis.     furosemide (LASIX) 40 MG tablet TAKE 1 TABLET TWICE DAILY (Patient taking differently: Take 20 mg by mouth daily. Extra tablet as needed) 180 tablet 2   gabapentin (NEURONTIN) 800 MG tablet Take 800 mg by mouth 3 (three) times daily.      insulin aspart (NOVOLOG) 100 UNIT/ML injection Inject into the skin See admin instructions. 1.25 Units 12 Am-7 Am,  1.75 u/hr 7 AM  Til 12 AM, Bolus 1 unit for every 15 carbs, 1 Unit for every 50 points greater than 150 Insulin pump     levocetirizine (XYZAL) 5 MG tablet Take 5 mg by mouth daily.      metoprolol succinate (TOPROL-XL) 25 MG 24 hr tablet TAKE 1 TABLET TWICE DAILY 180 tablet 3   montelukast (SINGULAIR) 10 MG tablet Take 10 mg by mouth daily.     omeprazole (PRILOSEC) 40 MG capsule TAKE 1 CAPSULE EVERY DAY     polyethylene glycol (MIRALAX / GLYCOLAX) packet Take 17 g by mouth daily as needed for moderate constipation.      rosuvastatin (CRESTOR) 40 MG tablet TAKE ONE TABLET BY MOUTH ONE TIME A DAY. STOP LIPITOR 90 tablet 3   Turmeric 500 MG TABS Take 1 tablet by mouth daily.     No current facility-administered medications for this visit.    Allergies:   Bactrim [sulfamethoxazole-trimethoprim], Codeine, Mobic [meloxicam], Morphine and codeine, Silvadene [silver sulfadiazine], and Adhesive [tape]    Social History:  The patient  reports that she quit smoking about 12 years ago. Her smoking use included cigarettes. She started smoking about 37 years ago. She  has never been exposed to tobacco smoke. She has never used smokeless tobacco. She reports that she does not drink alcohol and does not use drugs.   Family History:  The patient's family history includes Heart attack in her brother and mother; Heart disease in her brother and mother; Hypertension in her brother and mother.    ROS:  Please see the history of present illness.   Otherwise, review of systems are positive for none.   All other systems are reviewed and negative.    PHYSICAL EXAM: VS:  BP 124/68 (BP Location: Left Arm, Patient Position: Sitting, Cuff Size: Normal)  Pulse 82   Ht 5\' 3"  (1.6 m)   Wt 206 lb (93.4 kg)   BMI 36.49 kg/m  , BMI Body mass index is 36.49 kg/m. GEN: Well nourished, well developed, in no acute distress  HEENT: normal  Neck: no JVD, carotid bruits, or masses Cardiac: RRR; no murmurs, rubs, or gallops,no edema  Respiratory:  clear to auscultation bilaterally, normal work of breathing GI: soft, nontender, nondistended, + BS MS: no deformity or atrophy  Skin: warm and dry, no rash Neuro:  Strength and sensation are intact Psych: euthymic mood, full affect Vascular: Femoral pulses +1 on the right and barely palpable on the left.  Distal pulses are not palpable.   EKG:  EKG is not ordered today.    Recent Labs: No results found for requested labs within last 365 days.    Lipid Panel    Component Value Date/Time   CHOL 183 08/22/2017 1448   TRIG 143 08/22/2017 1448   HDL 36 (L) 08/22/2017 1448   CHOLHDL 5.1 08/22/2017 1448   VLDL 29 08/22/2017 1448   LDLCALC 118 (H) 08/22/2017 1448      Wt Readings from Last 3 Encounters:  05/01/23 206 lb (93.4 kg)  04/23/23 208 lb (94.3 kg)  12/26/22 205 lb 12.8 oz (93.4 kg)           No data to display            ASSESSMENT AND PLAN:  1.  Peripheral arterial disease: Angiography in 2019 showed mild to moderate nonobstructive peripheral arterial disease.  She has worsening claudication on  the left side and her ABI dropped to 0.6.  By physical exam, her femoral pulses are very diminished especially on the left side.   I suspect that her iliac disease has worsened on the left side and is likely responsible for the majority of her left hip and leg discomfort with minimal exertion. She does have other symptoms that are likely due to her lumbar spine disease but most of her symptoms are consistent with severe claudication. She is severely limited by her symptoms and improved with cilostazol. I recommend proceeding with abdominal aortogram with lower extremity angiography and possible endovascular intervention. Will hydrate 4 hours before the procedure given underlying chronic kidney disease.  Most recent creatinine was slightly less than 1.4. I discussed the procedure in details as well as risk and benefits. Planned access is via the right common femoral artery.  2.  Coronary artery disease involving native coronary arteries: Symptoms are well controlled at the present time.  3.  Chronic systolic heart failure: She appears to be euvolemic currently on optimal medical therapy.  4.  Hyperlipidemia: Currently on rosuvastatin and Zetia.  Most recent lipid profile in January showed an LDL of 60.  5.  Carotid disease: This was mild on most recent carotid Doppler.   Disposition:   Proceed with abdominal aortic and lower extremity angiography and follow-up after.  Signed,  Lorine Bears, MD  05/02/2023 5:07 PM    [facility]

## 2023-05-01 NOTE — Patient Instructions (Signed)
Medication Instructions:  No changes *If you need a refill on your cardiac medications before your next appointment, please call your pharmacy*  Testing/Procedures: Your physician has requested that you have a peripheral vascular angiogram. This exam is performed at the hospital. During this exam IV contrast is used to look at arterial blood flow. Please review the information sheet given for details.   Follow-Up: At [facility], you and your health needs are our priority.  As part of our continuing mission to provide you with exceptional heart care, we have created designated Provider Care Teams.  These Care Teams include your primary Cardiologist (physician) and Advanced Practice Providers (APPs -  Physician Assistants and Nurse Practitioners) who all work together to provide you with the care you need, when you need it.  We recommend signing up for the patient portal called "MyChart".  Sign up information is provided on this After Visit Summary.  MyChart is used to connect with patients for Virtual Visits (Telemedicine).  Patients are able to view lab/test results, encounter notes, upcoming appointments, etc.  Non-urgent messages can be sent to your provider as well.   To learn more about what you can do with MyChart, go to [email or web address].    Your next appointment:   Keep your post procedure follow up on 11/11 at 3:35 pm  Other Instructions  [facility] A DEPT OF [facility] AT [facility] [address] [place] [address] [place] [address] Dept: [phone] Loc: [phone]  [facility]  05/01/2023  You are scheduled for a Peripheral Angiogram on Wednesday, October 16 with Dr. Lorine Bears.  1. Please arrive at the [facility] (Main Entrance A) at [facility]: [address] [place], [place] [address] at 6:00 AM (This time is 4 hour(s) before your procedure to ensure your preparation). Free  valet parking service is available. You will check in at ADMITTING. The support person will be asked to wait in the waiting room.  It is OK to have someone drop you off and come back when you are ready to be discharged.    Special note: Every effort is made to have your procedure done on time. Please understand that emergencies sometimes delay scheduled procedures.  2. Diet: Do not eat solid foods after midnight.  The patient may have clear liquids until 5am upon the day of the procedure.  3. Labs: You will need to have blood drawn on 04/30/23 You do not need to be fasting.  4. Medication instructions in preparation for your procedure: Hold the Furosemide the morning of the procedure Hold all diabetic medication the morning of the procedure  On the morning of your procedure, take your Aspirin 81 mg and any morning medicines NOT listed above.  You may use sips of water.  5. Plan to go home the same day, you will only stay overnight if medically necessary. 6. Bring a current list of your medications and current insurance cards. 7. You MUST have a responsible person to drive you home. 8. Someone MUST be with you the first 24 hours after you arrive home or your discharge will be delayed. 9. Please wear clothes that are easy to get on and off and wear slip-on shoes.  Thank you for allowing [place] to care for you!   -- [facility]

## 2023-05-01 NOTE — Progress Notes (Unsigned)
Cardiology Office Note   Date:  05/02/2023   ID:  Kiara [name], DOB 03/14/1951, MRN [id number]  PCP:  Majel Homer, MD  Cardiologist: Dr. Jenene Slicker  No chief complaint on file.     History of Present Illness: Kiara [name] is a 72 y.o. female who is here today for follow-up visit regarding peripheral arterial disease . She has known history of coronary artery disease status post CABG, chronic systolic heart failure, diabetes mellitus, hyperlipidemia and diabetes mellitus.  She is been diabetic for 60 years.  She had amputation of the right lateral toes in 2006. She had a non-ST elevation myocardial infarction in January of 2019.  Cardiac catheterization via the right femoral artery showed severe native vessel coronary artery disease with patent LIMA to LAD and patent SVG to right PDA.  Filling pressures were severely elevated with severe pulmonary hypertension.  Medical therapy was recommended.   She was seen in 2019 for severe bilateral leg claudication. Vascular studies showed an ABI of 0.92 on the right and 0.93 on the left.  Toe pressure was significantly abnormal bilaterally.    Duplex showed moderate bilateral common femoral artery and SFA disease. CO2 angiography was performed in August 2019 which showed mild disease affecting the right common femoral and SFA disease with three-vessel runoff below the knee.  In the left, there was moderate calcified common iliac artery stenosis and moderate distal SFA disease.  The disease was not significant enough to explain severity of her symptoms.  Most recent vascular testing in April 2024 showed  a drop on the left side to 0.60.  It was previously 0.88.   Her symptoms include severe low back pain radiating to the left hip and down to the thigh and calf.  This has progressed significantly since last visit and she had no improvement with cilostazol.  In addition, she is known to have lumbar spine disease and was seen by Dr. Danielle Dess with  plans for MRI of the spine.  Past Medical History:  Diagnosis Date   Anemia    Anxiety    Arthritis    Carotid artery disease (HCC)    a. Last carotid study in 2014 showed 1-39% RICA, 40-59% LICA.   Chronic combined systolic (congestive) and diastolic (congestive) heart failure (HCC)    a. EF 40-45% in 2014. b. EF 30-35% in 07/2017.   CKD (chronic kidney disease), stage III (HCC)    Coronary artery disease    a. s/p CABG 2014. b. Cath 07/2017 for NSTEMI -> med rx.   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     CARDIAC CATHETERIZATION     CARPAL TUNNEL RELEASE     CESAREAN SECTION  1983   CORONARY ARTERY BYPASS GRAFT N/A 04/11/2013   Procedure: CORONARY ARTERY BYPASS GRAFTING (CABG);  Surgeon: Alleen Borne, MD;  Location: [facility] OR;  Service: Open Heart Surgery;  Laterality: N/A;   EYE SURGERY Bilateral    laser   FRACTURE SURGERY Left 2005   RIGHT/LEFT HEART CATH AND CORONARY/GRAFT ANGIOGRAPHY N/A 08/06/2017   Procedure: RIGHT/LEFT HEART CATH AND CORONARY/GRAFT ANGIOGRAPHY;  Surgeon: Yvonne Kendall, MD;  Location: [facility];  Service: Cardiovascular;  Laterality: N/A;     Current Outpatient Medications  Medication Sig Dispense Refill   albuterol (VENTOLIN HFA) 108 (90 Base) MCG/ACT inhaler Inhale 2 puffs into the lungs every 4 (four) hours as needed.      aspirin EC 81 MG tablet Take 81 mg by mouth  daily.      Cholecalciferol (VITAMIN D) 2000 units tablet Take 2,000 Units by mouth daily.     cilostazol (PLETAL) 100 MG tablet TAKE 1 TABLET TWICE DAILY 180 tablet 3   Coenzyme Q10 (COQ10) 100 MG CAPS Take 100 mg by mouth daily.      DULoxetine (CYMBALTA) 60 MG capsule Take 60 mg by mouth daily.     ezetimibe (ZETIA) 10 MG tablet TAKE 1 TABLET EVERY DAY 90 tablet 3   fluticasone (FLONASE) 50 MCG/ACT nasal spray Place 2 sprays into both nostrils daily as needed for allergies or rhinitis.     furosemide (LASIX) 40 MG tablet TAKE 1 TABLET TWICE DAILY (Patient taking differently: Take 20 mg by mouth daily. Extra tablet as needed) 180 tablet 2   gabapentin (NEURONTIN) 800 MG tablet Take 800 mg by mouth 3 (three) times daily.      insulin aspart (NOVOLOG) 100 UNIT/ML injection Inject into the skin See admin instructions. 1.25 Units 12 Am-7 Am,  1.75 u/hr 7 AM  Til 12 AM, Bolus 1 unit for every 15 carbs, 1 Unit for every 50 points greater than 150 Insulin pump     levocetirizine (XYZAL) 5 MG tablet Take 5 mg by mouth daily.      metoprolol succinate (TOPROL-XL) 25 MG 24 hr tablet TAKE 1 TABLET TWICE DAILY 180 tablet 3   montelukast (SINGULAIR) 10 MG tablet Take 10 mg by mouth daily.     omeprazole (PRILOSEC) 40 MG capsule TAKE 1 CAPSULE EVERY DAY     polyethylene glycol (MIRALAX / GLYCOLAX) packet Take 17 g by mouth daily as needed for moderate constipation.      rosuvastatin (CRESTOR) 40 MG tablet TAKE ONE TABLET BY MOUTH ONE TIME A DAY. STOP LIPITOR 90 tablet 3   Turmeric 500 MG TABS Take 1 tablet by mouth daily.     No current facility-administered medications for this visit.    Allergies:   Bactrim [sulfamethoxazole-trimethoprim], Codeine, Mobic [meloxicam], Morphine and codeine, Silvadene [silver sulfadiazine], and Adhesive [tape]    Social History:  The patient  reports that she quit smoking about 12 years ago. Her smoking use included cigarettes. She started smoking about 37 years ago. She  has never been exposed to tobacco smoke. She has never used smokeless tobacco. She reports that she does not drink alcohol and does not use drugs.   Family History:  The patient's family history includes Heart attack in her brother and mother; Heart disease in her brother and mother; Hypertension in her brother and mother.    ROS:  Please see the history of present illness.   Otherwise, review of systems are positive for none.   All other systems are reviewed and negative.    PHYSICAL EXAM: VS:  BP 124/68 (BP Location: Left Arm, Patient Position: Sitting, Cuff Size: Normal)  Pulse 82   Ht 5\' 3"  (1.6 m)   Wt 206 lb (93.4 kg)   BMI 36.49 kg/m  , BMI Body mass index is 36.49 kg/m. GEN: Well nourished, well developed, in no acute distress  HEENT: normal  Neck: no JVD, carotid bruits, or masses Cardiac: RRR; no murmurs, rubs, or gallops,no edema  Respiratory:  clear to auscultation bilaterally, normal work of breathing GI: soft, nontender, nondistended, + BS MS: no deformity or atrophy  Skin: warm and dry, no rash Neuro:  Strength and sensation are intact Psych: euthymic mood, full affect Vascular: Femoral pulses +1 on the right and barely palpable on the left.  Distal pulses are not palpable.   EKG:  EKG is not ordered today.    Recent Labs: No results found for requested labs within last 365 days.    Lipid Panel    Component Value Date/Time   CHOL 183 08/22/2017 1448   TRIG 143 08/22/2017 1448   HDL 36 (L) 08/22/2017 1448   CHOLHDL 5.1 08/22/2017 1448   VLDL 29 08/22/2017 1448   LDLCALC 118 (H) 08/22/2017 1448      Wt Readings from Last 3 Encounters:  05/01/23 206 lb (93.4 kg)  04/23/23 208 lb (94.3 kg)  12/26/22 205 lb 12.8 oz (93.4 kg)           No data to display            ASSESSMENT AND PLAN:  1.  Peripheral arterial disease: Angiography in 2019 showed mild to moderate nonobstructive peripheral arterial disease.  She has worsening claudication on  the left side and her ABI dropped to 0.6.  By physical exam, her femoral pulses are very diminished especially on the left side.   I suspect that her iliac disease has worsened on the left side and is likely responsible for the majority of her left hip and leg discomfort with minimal exertion. She does have other symptoms that are likely due to her lumbar spine disease but most of her symptoms are consistent with severe claudication. She is severely limited by her symptoms and improved with cilostazol. I recommend proceeding with abdominal aortogram with lower extremity angiography and possible endovascular intervention. Will hydrate 4 hours before the procedure given underlying chronic kidney disease.  Most recent creatinine was slightly less than 1.4. I discussed the procedure in details as well as risk and benefits. Planned access is via the right common femoral artery.  2.  Coronary artery disease involving native coronary arteries: Symptoms are well controlled at the present time.  3.  Chronic systolic heart failure: She appears to be euvolemic currently on optimal medical therapy.  4.  Hyperlipidemia: Currently on rosuvastatin and Zetia.  Most recent lipid profile in January showed an LDL of 60.  5.  Carotid disease: This was mild on most recent carotid Doppler.   Disposition:   Proceed with abdominal aortic and lower extremity angiography and follow-up after.  Signed,  Lorine Bears, MD  05/02/2023 5:07 PM    [facility]

## 2023-05-15 ENCOUNTER — Telehealth: Payer: Self-pay | Admitting: *Deleted

## 2023-05-15 NOTE — Telephone Encounter (Addendum)
Abdominal aortogram scheduled at [facility] for: Wednesday May 16, 2023 10:30 AM Arrival time [facility] Main Entrance A at: 6 AM-pre-procedure hydration  Nothing to eat after midnight prior to procedure, clear liquids until 5 AM day of procedure.  Medication instructions -Hold:  Lasix -day before and day of procedure-per protocol GFR 41  Insulin pump-pt will manage -Other usual morning medications can be taken with sips of water including aspirin 81 mg.  Plan to go home the same day, you will only stay overnight if medically necessary.  You must have responsible adult to drive you home.  Someone must be with you the first 24 hours after you arrive home.  Reviewed procedure instructions with patient.

## 2023-05-16 ENCOUNTER — Other Ambulatory Visit: Payer: Self-pay | Admitting: *Deleted

## 2023-05-16 ENCOUNTER — Other Ambulatory Visit: Payer: Self-pay

## 2023-05-16 ENCOUNTER — Encounter (HOSPITAL_COMMUNITY)
Admission: RE | Disposition: A | Discharge status: Home / Self Care | Payer: Self-pay | Source: Home / Self Care | Attending: Cardiovascular Disease

## 2023-05-16 ENCOUNTER — Ambulatory Visit (HOSPITAL_COMMUNITY)
Admission: RE | Admit: 2023-05-16 | Discharge: 2023-05-16 | Disposition: A | Discharge status: Home / Self Care | Payer: Medicare HMO | Attending: Cardiovascular Disease | Admitting: Cardiovascular Disease

## 2023-05-16 DIAGNOSIS — I70212 Atherosclerosis of native arteries of extremities with intermittent claudication, left leg: Secondary | ICD-10-CM | POA: Diagnosis not present

## 2023-05-16 DIAGNOSIS — I2510 Atherosclerotic heart disease of native coronary artery without angina pectoris: Secondary | ICD-10-CM | POA: Insufficient documentation

## 2023-05-16 DIAGNOSIS — Z794 Long term (current) use of insulin: Secondary | ICD-10-CM | POA: Diagnosis not present

## 2023-05-16 DIAGNOSIS — I130 Hypertensive heart and chronic kidney disease with heart failure and stage 1 through stage 4 chronic kidney disease, or unspecified chronic kidney disease: Secondary | ICD-10-CM | POA: Insufficient documentation

## 2023-05-16 DIAGNOSIS — E1151 Type 2 diabetes mellitus with diabetic peripheral angiopathy without gangrene: Secondary | ICD-10-CM | POA: Insufficient documentation

## 2023-05-16 DIAGNOSIS — N183 Chronic kidney disease, stage 3 unspecified: Secondary | ICD-10-CM | POA: Diagnosis not present

## 2023-05-16 DIAGNOSIS — I5022 Chronic systolic (congestive) heart failure: Secondary | ICD-10-CM | POA: Insufficient documentation

## 2023-05-16 DIAGNOSIS — Z79899 Other long term (current) drug therapy: Secondary | ICD-10-CM | POA: Diagnosis not present

## 2023-05-16 DIAGNOSIS — E785 Hyperlipidemia, unspecified: Secondary | ICD-10-CM | POA: Diagnosis not present

## 2023-05-16 DIAGNOSIS — E1122 Type 2 diabetes mellitus with diabetic chronic kidney disease: Secondary | ICD-10-CM | POA: Insufficient documentation

## 2023-05-16 DIAGNOSIS — I779 Disorder of arteries and arterioles, unspecified: Secondary | ICD-10-CM | POA: Diagnosis not present

## 2023-05-16 DIAGNOSIS — I739 Peripheral vascular disease, unspecified: Secondary | ICD-10-CM

## 2023-05-16 DIAGNOSIS — I252 Old myocardial infarction: Secondary | ICD-10-CM | POA: Diagnosis not present

## 2023-05-16 DIAGNOSIS — Z87891 Personal history of nicotine dependence: Secondary | ICD-10-CM | POA: Insufficient documentation

## 2023-05-16 DIAGNOSIS — Z951 Presence of aortocoronary bypass graft: Secondary | ICD-10-CM | POA: Diagnosis not present

## 2023-05-16 HISTORY — PX: PERIPHERAL INTRAVASCULAR LITHOTRIPSY: CATH118324

## 2023-05-16 HISTORY — PX: PERIPHERAL VASCULAR INTERVENTION: CATH118257

## 2023-05-16 HISTORY — PX: ABDOMINAL AORTOGRAM W/LOWER EXTREMITY: CATH118223

## 2023-05-16 LAB — GLUCOSE, CAPILLARY: Glucose-Capillary: 257 mg/dL — ABNORMAL HIGH (ref 70–99)

## 2023-05-16 SURGERY — ABDOMINAL AORTOGRAM W/LOWER EXTREMITY
Anesthesia: LOCAL

## 2023-05-16 MED ORDER — FENTANYL CITRATE (PF) 100 MCG/2ML IJ SOLN
INTRAMUSCULAR | Status: DC | PRN
  Administered 2023-05-16 (×4): 25 ug via INTRAVENOUS

## 2023-05-16 MED ORDER — ASPIRIN 81 MG PO CHEW: 81.0000 mg | CHEWABLE_TABLET | ORAL | Status: DC

## 2023-05-16 MED ORDER — LIDOCAINE HCL (PF) 1 % IJ SOLN
INTRAMUSCULAR | Status: AC
  Filled 2023-05-16: qty 30

## 2023-05-16 MED ORDER — SODIUM CHLORIDE 0.9 % IV SOLN: INTRAVENOUS | Status: DC

## 2023-05-16 MED ORDER — MIDAZOLAM HCL 2 MG/2ML IJ SOLN
INTRAMUSCULAR | Status: AC
  Filled 2023-05-16: qty 2

## 2023-05-16 MED ORDER — HEPARIN SODIUM (PORCINE) 1000 UNIT/ML IJ SOLN
INTRAMUSCULAR | Status: DC | PRN
  Administered 2023-05-16: 8000 [IU] via INTRAVENOUS

## 2023-05-16 MED ORDER — HYDRALAZINE HCL 20 MG/ML IJ SOLN
INTRAMUSCULAR | Status: AC
  Filled 2023-05-16: qty 1

## 2023-05-16 MED ORDER — SODIUM CHLORIDE 0.9 % IV SOLN: 250.0000 mL | INTRAVENOUS | Status: DC | PRN

## 2023-05-16 MED ORDER — HYDRALAZINE HCL 20 MG/ML IJ SOLN: 10.0000 mg | INTRAMUSCULAR | Status: DC | PRN

## 2023-05-16 MED ORDER — IODIXANOL 320 MG/ML IV SOLN
INTRAVENOUS | Status: DC | PRN
  Administered 2023-05-16: 160 mL

## 2023-05-16 MED ORDER — MIDAZOLAM HCL 2 MG/2ML IJ SOLN
INTRAMUSCULAR | Status: DC | PRN
  Administered 2023-05-16: 1 mg via INTRAVENOUS

## 2023-05-16 MED ORDER — CLOPIDOGREL BISULFATE 75 MG PO TABS: 75.0000 mg | ORAL_TABLET | Freq: Every day | ORAL | 6 refills | Status: DC

## 2023-05-16 MED ORDER — ACETAMINOPHEN 325 MG PO TABS: 650.0000 mg | ORAL_TABLET | ORAL | Status: DC | PRN

## 2023-05-16 MED ORDER — HEPARIN (PORCINE) IN NACL 1000-0.9 UT/500ML-% IV SOLN
INTRAVENOUS | Status: DC | PRN
  Administered 2023-05-16: 1000 mL

## 2023-05-16 MED ORDER — HYDRALAZINE HCL 20 MG/ML IJ SOLN
INTRAMUSCULAR | Status: DC | PRN
  Administered 2023-05-16: 10 mg via INTRAVENOUS

## 2023-05-16 MED ORDER — LIDOCAINE HCL (PF) 1 % IJ SOLN
INTRAMUSCULAR | Status: DC | PRN
  Administered 2023-05-16: 15 mL via INTRADERMAL

## 2023-05-16 MED ORDER — FENTANYL CITRATE (PF) 100 MCG/2ML IJ SOLN
INTRAMUSCULAR | Status: AC
  Filled 2023-05-16: qty 2

## 2023-05-16 MED ORDER — SODIUM CHLORIDE 0.9% FLUSH: 3.0000 mL | INTRAVENOUS | Status: DC | PRN

## 2023-05-16 MED ORDER — SODIUM CHLORIDE 0.9% FLUSH: 3.0000 mL | Freq: Two times a day (BID) | INTRAVENOUS | Status: DC

## 2023-05-16 MED ORDER — ONDANSETRON HCL 4 MG/2ML IJ SOLN: 4.0000 mg | Freq: Four times a day (QID) | INTRAMUSCULAR | Status: DC | PRN

## 2023-05-16 SURGICAL SUPPLY — 25 items
BALLN MUSTANG 4.0X40 75 (BALLOONS) ×2
BALLN MUSTANG 7.0X20 75 (BALLOONS) ×2
BALLN MUSTANG 8X20X135 (BALLOONS) ×2
BALLOON MUSTANG 4.0X40 75 (BALLOONS) IMPLANT
BALLOON MUSTANG 7.0X20 75 (BALLOONS) IMPLANT
BALLOON MUSTANG 8X20X135 (BALLOONS) IMPLANT
CATH ANGIO 5F PIGTAIL 65CM (CATHETERS) IMPLANT
CATH CROSS OVER TEMPO 5F (CATHETERS) IMPLANT
CATH SHOCKWAVE M5 7.0X60 (CATHETERS) IMPLANT
CATH STRAIGHT 5FR 65CM (CATHETERS) IMPLANT
CLOSURE PERCLOSE PROSTYLE (VASCULAR PRODUCTS) IMPLANT
GLIDEWIRE ADV .035X260CM (WIRE) IMPLANT
KIT ENCORE 26 ADVANTAGE (KITS) IMPLANT
KIT MICROPUNCTURE NIT STIFF (SHEATH) IMPLANT
KIT PV (KITS) ×2 IMPLANT
SHEATH CATAPULT 7F 45 MP (SHEATH) IMPLANT
SHEATH PINNACLE 5F 10CM (SHEATH) IMPLANT
SHEATH PROBE COVER 6X72 (BAG) IMPLANT
STENT ABSOLUTE 9X40X135 (Permanent Stent) IMPLANT
STOPCOCK MORSE 400PSI 3WAY (MISCELLANEOUS) IMPLANT
TRANSDUCER W/STOPCOCK (MISCELLANEOUS) ×2 IMPLANT
TRAY PV CATH (CUSTOM PROCEDURE TRAY) ×2 IMPLANT
TUBING CIL FLEX 10 FLL-RA (TUBING) IMPLANT
WIRE HITORQ VERSACORE ST 145CM (WIRE) IMPLANT
WIRE SPARTACORE .014X300CM (WIRE) IMPLANT

## 2023-05-16 NOTE — Discharge Instructions (Signed)
Femoral Site Care This sheet gives you information about how to care for yourself after your procedure. Your health care provider may also give you more specific instructions. If you have problems or questions, contact your health care provider. What can I expect after the procedure?  After the procedure, it is common to have: Bruising that usually fades within 1-2 weeks. Tenderness at the site. Follow these instructions at home: Wound care Follow instructions from your health care provider about how to take care of your insertion site. Make sure you: Wash your hands with soap and water before you change your bandage (dressing). If soap and water are not available, use hand sanitizer. Remove your dressing as told by your health care provider. 24 hours Do not take baths, swim, or use a hot tub until your health care provider approves. You may shower 24-48 hours after the procedure or as told by your health care provider. Gently wash the site with plain soap and water. Pat the area dry with a clean towel. Do not rub the site. This may cause bleeding. Do not apply powder or lotion to the site. Keep the site clean and dry. Check your femoral site every day for signs of infection. Check for: Redness, swelling, or pain. Fluid or blood. Warmth. Pus or a bad smell. Activity For the first 2-3 days after your procedure, or as long as directed: Avoid climbing stairs as much as possible. Do not squat. Do not lift anything that is heavier than 10 lb (4.5 kg), or the limit that you are told, until your health care provider says that it is safe. For 5 days Rest as directed. Avoid sitting for a long time without moving. Get up to take short walks every 1-2 hours. Do not drive for 24 hours if you were given a medicine to help you relax (sedative). General instructions Take over-the-counter and prescription medicines only as told by your health care provider. Keep all follow-up visits as told by your  health care provider. This is important. Contact a health care provider if you have: A fever or chills. You have redness, swelling, or pain around your insertion site. Get help right away if: The catheter insertion area swells very fast. You pass out. You suddenly start to sweat or your skin gets clammy. The catheter insertion area is bleeding, and the bleeding does not stop when you hold steady pressure on the area. The area near or just beyond the catheter insertion site becomes pale, cool, tingly, or numb. These symptoms may represent a serious problem that is an emergency. Do not wait to see if the symptoms will go away. Get medical help right away. Call your local emergency services (911 in the [place] Do not drive yourself to the hospital. Summary After the procedure, it is common to have bruising that usually fades within 1-2 weeks. Check your femoral site every day for signs of infection. Do not lift anything that is heavier than 10 lb (4.5 kg), or the limit that you are told, until your health care provider says that it is safe. This information is not intended to replace advice given to you by your health care provider. Make sure you discuss any questions you have with your health care provider. Document Revised: 07/30/2017 Document Reviewed: 07/30/2017 Elsevier Patient Education  2020 [employer]  

## 2023-05-16 NOTE — Interval H&P Note (Signed)
History and Physical Interval Note:  05/16/2023 10:13 AM  Kiara [name]  has presented today for surgery, with the diagnosis of pad.  The various methods of treatment have been discussed with the patient and family. After consideration of risks, benefits and other options for treatment, the patient has consented to  Procedure(s): ABDOMINAL AORTOGRAM W/LOWER EXTREMITY (N/A) as a surgical intervention.  The patient's history has been reviewed, patient examined, no change in status, stable for surgery.  I have reviewed the patient's chart and labs.  Questions were answered to the patient's satisfaction.     Lorine Bears

## 2023-05-17 ENCOUNTER — Encounter (HOSPITAL_COMMUNITY): Payer: Self-pay | Admitting: Cardiovascular Disease

## 2023-05-17 LAB — POCT ACTIVATED CLOTTING TIME: Activated Clotting Time: 287 s

## 2023-05-23 NOTE — Plan of Care (Signed)
[facility] Tonsillectomy/Adenoidectomy, Postoperative PEDS care plan entered in error.

## 2023-05-29 ENCOUNTER — Ambulatory Visit
Admission: RE | Admit: 2023-05-29 | Discharge: 2023-05-29 | Disposition: A | Discharge status: Home / Self Care | Payer: Medicare HMO | Source: Ambulatory Visit | Attending: Neurological Surgery | Admitting: Neurological Surgery

## 2023-05-29 DIAGNOSIS — M5416 Radiculopathy, lumbar region: Secondary | ICD-10-CM

## 2023-05-30 ENCOUNTER — Ambulatory Visit (HOSPITAL_COMMUNITY)
Admission: RE | Admit: 2023-05-30 | Discharge: 2023-05-30 | Disposition: A | Discharge status: Home / Self Care | Payer: Medicare HMO | Source: Ambulatory Visit | Attending: Internal Medicine | Admitting: Internal Medicine

## 2023-05-30 ENCOUNTER — Ambulatory Visit (HOSPITAL_BASED_OUTPATIENT_CLINIC_OR_DEPARTMENT_OTHER)
Admit: 2023-05-30 | Discharge: 2023-05-30 | Disposition: A | Discharge status: Home / Self Care | Payer: Medicare HMO | Attending: Cardiovascular Disease | Admitting: Cardiovascular Disease

## 2023-05-30 DIAGNOSIS — Z95828 Presence of other vascular implants and grafts: Secondary | ICD-10-CM

## 2023-05-30 DIAGNOSIS — I739 Peripheral vascular disease, unspecified: Secondary | ICD-10-CM | POA: Insufficient documentation

## 2023-06-01 LAB — VAS US ABI WITH/WO TBI
Left ABI: 1.25
Right ABI: 0.67

## 2023-06-05 NOTE — Addendum Note (Signed)
Addended by: Sandi Mariscal on: 06/05/2023 08:50 AM   Modules accepted: Orders

## 2023-06-11 ENCOUNTER — Ambulatory Visit: Payer: Medicare HMO | Admitting: Physician Assistant

## 2023-06-12 ENCOUNTER — Ambulatory Visit: Payer: Medicare HMO | Attending: Physician Assistant | Admitting: Physician Assistant

## 2023-06-12 ENCOUNTER — Encounter: Payer: Self-pay | Admitting: Physician Assistant

## 2023-06-12 ENCOUNTER — Other Ambulatory Visit: Payer: Self-pay

## 2023-06-12 VITALS — BP 102/64 | HR 71 | Ht 63.0 in | Wt 207.6 lb

## 2023-06-12 DIAGNOSIS — I5022 Chronic systolic (congestive) heart failure: Secondary | ICD-10-CM

## 2023-06-12 DIAGNOSIS — I25810 Atherosclerosis of coronary artery bypass graft(s) without angina pectoris: Secondary | ICD-10-CM

## 2023-06-12 DIAGNOSIS — I739 Peripheral vascular disease, unspecified: Secondary | ICD-10-CM

## 2023-06-12 DIAGNOSIS — E119 Type 2 diabetes mellitus without complications: Secondary | ICD-10-CM

## 2023-06-12 DIAGNOSIS — E785 Hyperlipidemia, unspecified: Secondary | ICD-10-CM

## 2023-06-12 MED ORDER — CLOPIDOGREL BISULFATE 75 MG PO TABS: 75.0000 mg | ORAL_TABLET | Freq: Every day | ORAL | 1 refills | Status: DC

## 2023-06-12 NOTE — Patient Instructions (Addendum)
Medication Instructions:  NO CHANGES *If you need a refill on your cardiac medications before your next appointment, please call your pharmacy*   Lab Work: NO LABS If you have labs (blood work) drawn today and your tests are completely normal, you will receive your results only by: MyChart Message (if you have MyChart) OR A paper copy in the mail If you have any lab test that is abnormal or we need to change your treatment, we will call you to review the results.   Testing/Procedures: END OF APRIL 2025 Your physician has requested that you have an ankle brachial index (ABI). During this test an ultrasound and blood pressure cuff are used to evaluate the arteries that supply the arms and legs with blood. Allow thirty minutes for this exam. There are no restrictions or special instructions.  Please note: We ask at that you not bring children with you during ultrasound (echo/ vascular) testing. Due to room size and safety concerns, children are not allowed in the ultrasound rooms during exams. Our front office staff cannot provide observation of children in our lobby area while testing is being conducted. An adult accompanying a patient to their appointment will only be allowed in the ultrasound room at the discretion of the ultrasound technician under special circumstances. We apologize for any inconvenience.  Your physician has requested that you have an Aorta/Iliac Duplex.  No food after 11PM the night before.  Water is OK. (Don't drink liquids if you have been instructed not to for ANOTHER test) Avoid foods that produce bowel gas, for 24 hours prior to exam (see below). No breakfast, no chewing gum, no smoking or carbonated beverages. Patient may take morning medications with water. Come in for test at least 15 minutes early to register.  Please note: We ask at that you not bring children with you during ultrasound (echo/ vascular) testing. Due to room size and safety concerns, children  are not allowed in the ultrasound rooms during exams. Our front office staff cannot provide observation of children in our lobby area while testing is being conducted. An adult accompanying a patient to their appointment will only be allowed in the ultrasound room at the discretion of the ultrasound technician under special circumstances. We apologize for any inconvenience.    Follow-Up: At [facility], you and your health needs are our priority.  As part of our continuing mission to provide you with exceptional heart care, we have created designated Provider Care Teams.  These Care Teams include your primary Cardiologist (physician) and Advanced Practice Providers (APPs -  Physician Assistants and Nurse Practitioners) who all work together to provide you with the care you need, when you need it.   Your next appointment:   AFTER ABI AND AND AORTA ILIAC -APRIL 2025  Provider:   Lorine Bears, MD

## 2023-06-12 NOTE — Progress Notes (Signed)
Cardiology Office Note:  .   Date:  06/12/2023  ID:  Kiara [name], DOB [birth year], MRN [id number] PCP: Kiara Homer, MD  [facility] Providers Cardiologist:  Kiara Bicker, MD PV Cardiologist:  Kiara Bears, MD     History of Present Illness: .   Kiara [name] is a 72 y.o. female with PMH of PAD, CAD s/p CABG, chronic systolic heart failure, hyperlipidemia and DM2.  She had a amputation of the right lateral toe in 2006.  She had NSTEMI in January 2019, subsequent cardiac catheterization revealed patent LIMA to LAD, patent SVG to right PDA, severe pulmonary hypertension.  Medical therapy was recommended.  She developed claudication symptoms in 2019, ABI at the time showed a 0.92 on the right, 0.93 on the left.  Duplex showed moderate bilateral common femoral artery and SFA disease.  Lower extremity angiography performed in August 2019 showed mild disease affecting the right common femoral artery and SFA with three-vessel runoff below the knee.  Moderate calcified, left iliac artery stenosis and a moderate distal left SFA disease.  Severity of her lower extremity PAD is inconsistent with her symptoms.  She has history of lumbar spine disease followed by Dr. Danielle [name].  She was recently seen by Dr. Kirke [name] on 05/01/2023 at which time she described worsening claudication symptom on the left side.  She underwent lower extremity angiography on 05/16/2023 that showed severe heavily calcified eccentric stenosis of the left common iliac artery treated with lithotripsy and self-expanding stent.  Her Pletal was discontinued postprocedure and she was placed on aspirin and Plavix.  Postprocedure, her ABI obtained on 05/30/2023 showed improved ABI on the left side from the previous at 0.60-1.25.  Lower extremity arterial Doppler showed patent iliac stent.  Patient presents today for follow-up.  Since her procedure, she has had a significant improvement in her left lower extremity strength.  She no  longer have any pain in the left lower extremity and has been able to walk without stopping for long period of time.  She is compliant with aspirin and Plavix.  We recommend to continue aspirin and Plavix for 3 to 6 months.  When she returned in 6 months for her ABI/LEA and a follow-up with Dr. Kirke [name] afterward, we could discontinue Plavix sent.  She has some left ankle edema, I encouraged her to use leg elevation.  Although her medication list documented 40 mg twice daily of Lasix, patient says she was previously told to take 40 mg daily of Lasix and only to take the second dose of Lasix for 3 days if she has increased leg edema.  I think this is quite reasonable given her CKD.  Otherwise, patient is near euvolemic level.  She has follow-up with her primary cardiologist in March.  ROS:   She denies chest pain, palpitations, dyspnea, pnd, orthopnea, n, v, dizziness, syncope, edema, weight gain, or early satiety. All other systems reviewed and are otherwise negative except as noted above.    Studies Reviewed: .        Cardiac Studies & Procedures   CARDIAC CATHETERIZATION  CARDIAC CATHETERIZATION 08/06/2017  Narrative Conclusions: 1. Severe, diffuse native coronary artery disease. 2. Widely patent LIMA-LAD with minimal runoff beyond the distal anastomosis. 3. Patent but poorly visualized SVG-rPDA. Further attempts to engage SVG were not made due to declining respiratory status in the setting of severely elevated left and right heart pressures. 4. Severely elevated left and right heart filling pressures. 5. Severe pulmonary hypertension. 6. Low  normal to reduced cardiac output.  Recommendations: 1. Transfer to cardiac ICU for aggressive diuresis and BiPAP (if needed) for shortness of breath in the setting of acute decompensated systolic heart failure. 2. Optimize evidence-based heart failure therapy. If patient does not respond well to diuresis, consultation with advanced heart failure service  will need to be considered. 3. If patient develops chest pain or has continued dyspnea when euvolemic, repeat catheterization to better visualize SVG->rPDA could be considered. 4. Aggressive secondary prevention of CAD.  Kiara Kendall, MD [facility] Pager: [phone]  Findings Coronary Findings Diagnostic  Dominance: Right  Left Main Vessel was injected. Very short left main; LAD and LCx with functionally separate ostia.  Left Anterior Descending Vessel was injected. Vessel is moderate in size. There is severe diffuse disease throughout the vessel. Ost LAD lesion is 80% stenosed. The lesion is calcified. Prox LAD lesion is 95% stenosed.  Second Septal Branch  Left Circumflex Vessel was injected. Vessel is moderate in size. Mid Cx to Dist Cx lesion is 50% stenosed.  First Obtuse Marginal Branch Ost 1st Mrg to 1st Mrg lesion is 100% stenosed. The lesion is chronically occluded.  Second Obtuse Marginal Branch Ost 2nd Mrg to 2nd Mrg lesion is 100% stenosed. The lesion is chronically occluded.  Third Obtuse Marginal Branch Vessel is moderate in size.  Right Coronary Artery Prox RCA lesion is 80% stenosed. Prox RCA to Dist RCA lesion is 100% stenosed. The lesion is chronically occluded.  LIMA LIMA Graft To Mid LAD LIMA graft was visualized by angiography and is normal in caliber. LIMA is widely patent but LAD is very small and diffusely diseased with minimal runoff beyond LIMA anastomis.  Saphenous Graft To RPDA SVG graft was visualized by non-selective angiography. Graft could not be selectively engaged. Flow is noted into the distal graft with non-selective injections. Distal anastosis and rPDA are not visualized.  Intervention  No interventions have been documented.     ECHOCARDIOGRAM  ECHOCARDIOGRAM COMPLETE 07/29/2019  Narrative ECHOCARDIOGRAM REPORT    Patient Name:   Kiara [name] Date of Exam: 07/29/2019 Medical Rec #:  [id number]         Height:       63.0 in Accession #:    [id number]       Weight:       215.0 lb Date of Birth:  1951/01/19        BSA:          1.99 m Patient Age:    68 years         BP:           124/48 mmHg Patient Gender: F                HR:           77 bpm. Exam Location:  [place]  Procedure: 2D Echo, Cardiac Doppler and Color Doppler  Indications:     R60.9 Edema, I50.9 CHF  History:         Patient has prior history of Echocardiogram examinations, most recent 12/21/2017. CHF, CAD, Prior CABG, PAD and Anemia, Signs/Symptoms:Edema; Risk Factors:Morbidly obese, Hypertension, Diabetes, Dyslipidemia and Former Smoker.  Sonographer:     Jake Seats RDMS, RVT, RDCS Referring Phys:  Nada Boozer, R Diagnosing Phys: Nona Dell MD   Sonographer Comments: Technically difficult study due to poor echo windows and patient is morbidly obese. Image acquisition challenging due to patient body habitus. IMPRESSIONS   1. Left ventricular ejection fraction,  by visual estimation, is 55 to 60%. The left ventricle has normal function. There is mildly increased left ventricular hypertrophy. 2. Left ventricular diastolic parameters are indeterminate. 3. The left ventricle has no regional wall motion abnormalities. 4. Global right ventricle has normal systolic function.The right ventricular size is normal. No increase in right ventricular wall thickness. 5. Left atrial size was normal. 6. Right atrial size was normal. 7. Mild to moderate mitral annular calcification. 8. The mitral valve is grossly normal. Trivial mitral valve regurgitation. 9. The tricuspid valve is grossly normal. 10. The aortic valve was not well visualized. Aortic valve regurgitation is trivial. 11. The pulmonic valve was grossly normal. Pulmonic valve regurgitation is trivial. 12. TR signal is inadequate for assessing pulmonary artery systolic pressure. 13. The inferior vena cava is normal in size with greater than 50% respiratory  variability, suggesting right atrial pressure of 3 mmHg.  FINDINGS Left Ventricle: Left ventricular ejection fraction, by visual estimation, is 55 to 60%. The left ventricle has normal function. The left ventricle has no regional wall motion abnormalities. There is mildly increased left ventricular hypertrophy. Left ventricular diastolic parameters are indeterminate.  Right Ventricle: The right ventricular size is normal. No increase in right ventricular wall thickness. Global RV systolic function is has normal systolic function. The tricuspid regurgitant velocity is 1.83 m/s, and with an assumed right atrial pressure of 3 mmHg, the estimated right ventricular systolic pressure is TR signal is inadequate for assessing PA pressure at 16.4 mmHg.  Left Atrium: Left atrial size was normal in size.  Right Atrium: Right atrial size was normal in size  Pericardium: There is no evidence of pericardial effusion.  Mitral Valve: The mitral valve is grossly normal. Mild to moderate mitral annular calcification. Trivial mitral valve regurgitation.  Tricuspid Valve: The tricuspid valve is grossly normal. Tricuspid valve regurgitation is trivial.  Aortic Valve: The aortic valve was not well visualized. Aortic valve regurgitation is trivial. Mild aortic valve annular calcification.  Pulmonic Valve: The pulmonic valve was grossly normal. Pulmonic valve regurgitation is trivial. Pulmonic regurgitation is trivial.  Aorta: The aortic root is normal in size and structure.  Venous: The inferior vena cava is normal in size with greater than 50% respiratory variability, suggesting right atrial pressure of 3 mmHg.  IAS/Shunts: No atrial level shunt detected by color flow Doppler.   LEFT VENTRICLE PLAX 2D LVIDd:         4.20 cm       Diastology LVIDs:         2.14 cm       LV e' lateral:   6.94 cm/s LV PW:         1.23 cm       LV E/e' lateral: 15.6 LV IVS:        0.88 cm       LV e' medial:    4.67  cm/s LVOT diam:     1.80 cm       LV E/e' medial:  23.1 LV SV:         63 ml LV SV Index:   29.79 LVOT Area:     2.54 cm  LV Volumes (MOD) LV area d, A2C:    33.80 cm LV area d, A4C:    28.55 cm LV area s, A2C:    21.60 cm LV area s, A4C:    17.90 cm LV major d, A2C:   7.78 cm LV major d, A4C:   7.70 cm LV major  s, A2C:   7.21 cm LV major s, A4C:   6.86 cm LV vol d, MOD A2C: 123.0 ml LV vol d, MOD A4C: 88.6 ml LV vol s, MOD A2C: 54.9 ml LV vol s, MOD A4C: 39.3 ml LV SV MOD A2C:     68.1 ml LV SV MOD A4C:     88.6 ml LV SV MOD BP:      57.3 ml  RIGHT VENTRICLE RV S prime:     6.00 cm/s TAPSE (M-mode): 1.7 cm  LEFT ATRIUM           Index LA diam:      3.40 cm 1.71 cm/m LA Vol (A2C): 44.9 ml 22.52 ml/m LA Vol (A4C): 47.9 ml 24.02 ml/m AORTIC VALVE LVOT Vmax:   98.30 cm/s LVOT Vmean:  70.700 cm/s LVOT VTI:    0.258 m  AORTA Ao Root diam: 2.70 cm  MITRAL VALVE                         TRICUSPID VALVE MV Area (PHT): 2.82 cm              TR Peak grad:   13.4 mmHg MV PHT:        78.01 msec            TR Vmax:        183.00 cm/s MV Decel Time: 269 msec MR Peak grad: 20.8 mmHg              SHUNTS MR Vmax:      228.00 cm/s            Systemic VTI:  0.26 m MV E velocity: 108.00 cm/s 103 cm/s  Systemic Diam: 1.80 cm MV A velocity: 115.00 cm/s 70.3 cm/s MV E/A ratio:  0.94        1.5   Nona Dell MD Electronically signed by Nona Dell MD Signature Date/Time: 07/29/2019/4:44:38 PM    Final             Risk Assessment/Calculations:             Physical Exam:   VS:  BP 102/64 (BP Location: Left Arm, Patient Position: Sitting, Cuff Size: Normal)   Pulse 71   Ht 5\' 3"  (1.6 m)   Wt 207 lb 9.6 oz (94.2 kg)   SpO2 98%   BMI 36.77 kg/m    Wt Readings from Last 3 Encounters:  06/12/23 207 lb 9.6 oz (94.2 kg)  05/16/23 207 lb (93.9 kg)  05/01/23 206 lb (93.4 kg)    GEN: Well nourished, well developed in no acute distress NECK: No JVD; No carotid  bruits CARDIAC: RRR, no murmurs, rubs, gallops RESPIRATORY:  Clear to auscultation without rales, wheezing or rhonchi  ABDOMEN: Soft, non-tender, non-distended EXTREMITIES:  No edema; No deformity   ASSESSMENT AND PLAN: .     Peripheral Arterial Disease (PAD) History of right lateral toe amputation in 2006. Claudication symptoms developed in 2019. Recent angiography on 05/16/2023 revealed severe left common iliac artery stenosis treated with lithotripsy and self-expanding stent. Post-procedure ABI improved from 0.60 to 1.25. Patient reports significant symptomatic improvement. -Continue Aspirin and Plavix. -Repeat lower extremity arterial Doppler and ABI near the end of April 2025. -Follow-up with Dr. [place] Sink after the study.  CAD s/p CABG: Denies any recent chest pain.  Chronic systolic heart failure Mild ankle swelling, possibly due to increased blood flow post-stent placement. -Continue current strategy of taking  second dose of Lasix (Furosemide 40mg ) as needed for swelling. -Elevate leg to assist with return blood flow.  Hyperlipidemia LDL goal should be less than 55 due to history of bypass surgery. -Ensure primary care doctor is checking cholesterol at least once a year.  DM2: Followed by primary care provider        Dispo: Follow-up with Dr. Kirke [name] in 6 months after repeat ABI and aoro/iliac/LEA  Signed, Azalee Course, PA

## 2023-07-31 LAB — COMPREHENSIVE METABOLIC PANEL WITH GFR: eGFR: 32

## 2023-07-31 LAB — LIPID PANEL
LDL Cholesterol: 49
Triglycerides: 133 (ref 40–160)

## 2023-07-31 LAB — BASIC METABOLIC PANEL WITH GFR
BUN: 30 — AB (ref 4–21)
Creatinine: 1.7 — AB (ref 0.5–1.1)
Glucose: 440

## 2023-07-31 LAB — VITAMIN D 25 HYDROXY (VIT D DEFICIENCY, FRACTURES): Vit D, 25-Hydroxy: 50

## 2023-07-31 LAB — HEMOGLOBIN A1C: Hemoglobin A1C: 13.5

## 2023-09-12 ENCOUNTER — Telehealth: Payer: Self-pay

## 2023-09-12 ENCOUNTER — Encounter: Payer: Self-pay | Admitting: Cardiovascular Disease

## 2023-09-12 NOTE — Telephone Encounter (Signed)
Dr.Ariada, when I saw Dr.Meng last time I was having some swelling in my left leg which I had not had it before. Only in my right leg. He told me if it continued to let you know. Since then I have had a lot of continued swelling and small bumps breaking out on my leg. What should I do? Thank you. Kiara [name]  Since last appointment swelling during the day but goes away at night. Lately it has been going up the leg to the knee and she can see swelling on both legs instead of just the one. Wants to see a provider. First available appointment is on Monday Feb 17th at 9:15 am with Harrell Lark PA.

## 2023-09-17 ENCOUNTER — Ambulatory Visit: Payer: Medicare PPO | Attending: Physician Assistant | Admitting: Physician Assistant

## 2023-09-17 ENCOUNTER — Encounter: Payer: Self-pay | Admitting: Physician Assistant

## 2023-09-17 VITALS — BP 118/82 | HR 73 | Ht 63.0 in | Wt 206.4 lb

## 2023-09-17 DIAGNOSIS — I739 Peripheral vascular disease, unspecified: Secondary | ICD-10-CM

## 2023-09-17 DIAGNOSIS — N183 Chronic kidney disease, stage 3 unspecified: Secondary | ICD-10-CM | POA: Diagnosis not present

## 2023-09-17 DIAGNOSIS — I25810 Atherosclerosis of coronary artery bypass graft(s) without angina pectoris: Secondary | ICD-10-CM | POA: Diagnosis not present

## 2023-09-17 DIAGNOSIS — R600 Localized edema: Secondary | ICD-10-CM | POA: Diagnosis not present

## 2023-09-17 MED ORDER — FUROSEMIDE 40 MG PO TABS: 40.0000 mg | ORAL_TABLET | Freq: Two times a day (BID) | ORAL | 2 refills | Status: DC

## 2023-09-17 NOTE — Progress Notes (Signed)
Cardiology Office Note:  .   Date:  09/17/2023  ID:  Shanon Brow, DOB 1951/05/26, MRN [id number] PCP: Majel Homer, MD  [facility] Providers Cardiologist:  Marjo Bicker, MD [facility] Cardiologist:  Lorine Bears, MD     History of Present Illness: .   Kiara [name] is a 73 y.o. female with PMH of PAD, CAD s/p CABG, chronic systolic heart failure, hyperlipidemia and DM2.  She had a amputation of the right lateral toe in 2006.  She had NSTEMI in January 2019, subsequent cardiac catheterization revealed patent LIMA to LAD, patent SVG to right PDA, severe pulmonary hypertension.  Medical therapy was recommended.  She developed claudication symptoms in 2019, ABI at the time showed a 0.92 on the right, 0.93 on the left.  Duplex showed moderate bilateral common femoral artery and SFA disease.  Lower extremity angiography performed in August 2019 showed mild disease affecting the right common femoral artery and SFA with three-vessel runoff below the knee.  Moderate calcified, left iliac artery stenosis and moderate distal left SFA disease.  Severity of her lower extremity PAD is inconsistent with her symptoms.  She has history of lumbar spine disease followed by Dr. Danielle Dess.  Due to worsening lower extremity claudication symptom, she underwent lower extremity angiography on 05/16/2023 that showed severe heavily calcified eccentric stenosis of the left common iliac artery treated with lithotripsy and self-expanding stent.  Her Pletal was discontinued postprocedure and she was placed on aspirin and Plavix.  Postprocedure, her ABI obtained on 05/30/2023 showed improved ABI on the left side from the previous at 0.60 to 1.25.  Lower extremity arterial Doppler showed patent iliac stent.   Patient presents today for evaluation of a left lower extremity swelling that is been going on since October.  She is still on 40 mg daily of Lasix.  I asked her to take extra dose of Lasix for 3 days before going  back to 40 mg daily dosing.  I am hesitant to permanently increase her diuretic dose given her poor renal function at baseline.  If the swelling does not resolve, I will obtain a venous Doppler on the next follow-up.  She is aware that she can take extra dose of diuretic in the afternoon if needed.  She has a follow-up with Dr. Greggory Keen in next month, if leg swelling still persist, I would recommend a venous Doppler.  However suspicion for DVT is fairly low.  She is not sure if she is taking Plavix.  I asked her to be compliant with aspirin and Plavix therapy until April.  When she returns for the arterial Doppler near the end of April, she can stop the Plavix therapy.     ROS:   She denies chest pain, palpitations, dyspnea, pnd, orthopnea, n, v, dizziness, syncope, weight gain, or early satiety. All other systems reviewed and are otherwise negative except as noted above.   2+ left lower extremity edema   Studies Reviewed: .        Cardiac Studies & Procedures   ______________________________________________________________________________________________ CARDIAC CATHETERIZATION  CARDIAC CATHETERIZATION 08/06/2017  Narrative Conclusions: 1. Severe, diffuse native coronary artery disease. 2. Widely patent LIMA-LAD with minimal runoff beyond the distal anastomosis. 3. Patent but poorly visualized SVG-rPDA. Further attempts to engage SVG were not made due to declining respiratory status in the setting of severely elevated left and right heart pressures. 4. Severely elevated left and right heart filling pressures. 5. Severe pulmonary hypertension. 6. Low normal to reduced cardiac  output.  Recommendations: 1. Transfer to cardiac ICU for aggressive diuresis and BiPAP (if needed) for shortness of breath in the setting of acute decompensated systolic heart failure. 2. Optimize evidence-based heart failure therapy. If patient does not respond well to diuresis, consultation with advanced heart  failure service will need to be considered. 3. If patient develops chest pain or has continued dyspnea when euvolemic, repeat catheterization to better visualize SVG->rPDA could be considered. 4. Aggressive secondary prevention of CAD.  Yvonne Kendall, MD [facility] Pager: [phone]  Findings Coronary Findings Diagnostic  Dominance: Right  Left Main Vessel was injected. Very short left main; LAD and LCx with functionally separate ostia.  Left Anterior Descending Vessel was injected. Vessel is moderate in size. There is severe diffuse disease throughout the vessel. Ost LAD lesion is 80% stenosed. The lesion is calcified. Prox LAD lesion is 95% stenosed.  Second Septal Branch  Left Circumflex Vessel was injected. Vessel is moderate in size. Mid Cx to Dist Cx lesion is 50% stenosed.  First Obtuse Marginal Branch Ost 1st Mrg to 1st Mrg lesion is 100% stenosed. The lesion is chronically occluded.  Second Obtuse Marginal Branch Ost 2nd Mrg to 2nd Mrg lesion is 100% stenosed. The lesion is chronically occluded.  Third Obtuse Marginal Branch Vessel is moderate in size.  Right Coronary Artery Prox RCA lesion is 80% stenosed. Prox RCA to Dist RCA lesion is 100% stenosed. The lesion is chronically occluded.  LIMA LIMA Graft To Mid LAD LIMA graft was visualized by angiography and is normal in caliber. LIMA is widely patent but LAD is very small and diffusely diseased with minimal runoff beyond LIMA anastomis.  Saphenous Graft To RPDA SVG graft was visualized by non-selective angiography. Graft could not be selectively engaged. Flow is noted into the distal graft with non-selective injections. Distal anastosis and rPDA are not visualized.  Intervention  No interventions have been documented.     ECHOCARDIOGRAM  ECHOCARDIOGRAM COMPLETE 07/29/2019  Narrative ECHOCARDIOGRAM REPORT    Patient Name:   Kiara [name] Date of Exam: 07/29/2019 Medical Rec #:   [id number]        Height:       63.0 in Accession #:    [id number]       Weight:       215.0 lb Date of Birth:  October 05, 1950        BSA:          1.99 m Patient Age:    68 years         BP:           124/48 mmHg Patient Gender: F                HR:           77 bpm. Exam Location:  [place]  Procedure: 2D Echo, Cardiac Doppler and Color Doppler  Indications:     R60.9 Edema, I50.9 CHF  History:         Patient has prior history of Echocardiogram examinations, most recent 12/21/2017. CHF, CAD, Prior CABG, PAD and Anemia, Signs/Symptoms:Edema; Risk Factors:Morbidly obese, Hypertension, Diabetes, Dyslipidemia and Former Smoker.  Sonographer:     Jake Seats RDMS, RVT, RDCS Referring Phys:  Nada Boozer, R Diagnosing Phys: Nona Dell MD   Sonographer Comments: Technically difficult study due to poor echo windows and patient is morbidly obese. Image acquisition challenging due to patient body habitus. IMPRESSIONS   1. Left ventricular ejection fraction, by visual estimation, is  55 to 60%. The left ventricle has normal function. There is mildly increased left ventricular hypertrophy. 2. Left ventricular diastolic parameters are indeterminate. 3. The left ventricle has no regional wall motion abnormalities. 4. Global right ventricle has normal systolic function.The right ventricular size is normal. No increase in right ventricular wall thickness. 5. Left atrial size was normal. 6. Right atrial size was normal. 7. Mild to moderate mitral annular calcification. 8. The mitral valve is grossly normal. Trivial mitral valve regurgitation. 9. The tricuspid valve is grossly normal. 10. The aortic valve was not well visualized. Aortic valve regurgitation is trivial. 11. The pulmonic valve was grossly normal. Pulmonic valve regurgitation is trivial. 12. TR signal is inadequate for assessing pulmonary artery systolic pressure. 13. The inferior vena cava is normal in size with greater than 50%  respiratory variability, suggesting right atrial pressure of 3 mmHg.  FINDINGS Left Ventricle: Left ventricular ejection fraction, by visual estimation, is 55 to 60%. The left ventricle has normal function. The left ventricle has no regional wall motion abnormalities. There is mildly increased left ventricular hypertrophy. Left ventricular diastolic parameters are indeterminate.  Right Ventricle: The right ventricular size is normal. No increase in right ventricular wall thickness. Global RV systolic function is has normal systolic function. The tricuspid regurgitant velocity is 1.83 m/s, and with an assumed right atrial pressure of 3 mmHg, the estimated right ventricular systolic pressure is TR signal is inadequate for assessing PA pressure at 16.4 mmHg.  Left Atrium: Left atrial size was normal in size.  Right Atrium: Right atrial size was normal in size  Pericardium: There is no evidence of pericardial effusion.  Mitral Valve: The mitral valve is grossly normal. Mild to moderate mitral annular calcification. Trivial mitral valve regurgitation.  Tricuspid Valve: The tricuspid valve is grossly normal. Tricuspid valve regurgitation is trivial.  Aortic Valve: The aortic valve was not well visualized. Aortic valve regurgitation is trivial. Mild aortic valve annular calcification.  Pulmonic Valve: The pulmonic valve was grossly normal. Pulmonic valve regurgitation is trivial. Pulmonic regurgitation is trivial.  Aorta: The aortic root is normal in size and structure.  Venous: The inferior vena cava is normal in size with greater than 50% respiratory variability, suggesting right atrial pressure of 3 mmHg.  IAS/Shunts: No atrial level shunt detected by color flow Doppler.   LEFT VENTRICLE PLAX 2D LVIDd:         4.20 cm       Diastology LVIDs:         2.14 cm       LV e' lateral:   6.94 cm/s LV PW:         1.23 cm       LV E/e' lateral: 15.6 LV IVS:        0.88 cm       LV e' medial:     4.67 cm/s LVOT diam:     1.80 cm       LV E/e' medial:  23.1 LV SV:         63 ml LV SV Index:   29.79 LVOT Area:     2.54 cm  LV Volumes (MOD) LV area d, A2C:    33.80 cm LV area d, A4C:    28.55 cm LV area s, A2C:    21.60 cm LV area s, A4C:    17.90 cm LV major d, A2C:   7.78 cm LV major d, A4C:   7.70 cm LV major s, A2C:  7.21 cm LV major s, A4C:   6.86 cm LV vol d, MOD A2C: 123.0 ml LV vol d, MOD A4C: 88.6 ml LV vol s, MOD A2C: 54.9 ml LV vol s, MOD A4C: 39.3 ml LV SV MOD A2C:     68.1 ml LV SV MOD A4C:     88.6 ml LV SV MOD BP:      57.3 ml  RIGHT VENTRICLE RV S prime:     6.00 cm/s TAPSE (M-mode): 1.7 cm  LEFT ATRIUM           Index LA diam:      3.40 cm 1.71 cm/m LA Vol (A2C): 44.9 ml 22.52 ml/m LA Vol (A4C): 47.9 ml 24.02 ml/m AORTIC VALVE LVOT Vmax:   98.30 cm/s LVOT Vmean:  70.700 cm/s LVOT VTI:    0.258 m  AORTA Ao Root diam: 2.70 cm  MITRAL VALVE                         TRICUSPID VALVE MV Area (PHT): 2.82 cm              TR Peak grad:   13.4 mmHg MV PHT:        78.01 msec            TR Vmax:        183.00 cm/s MV Decel Time: 269 msec MR Peak grad: 20.8 mmHg              SHUNTS MR Vmax:      228.00 cm/s            Systemic VTI:  0.26 m MV E velocity: 108.00 cm/s 103 cm/s  Systemic Diam: 1.80 cm MV A velocity: 115.00 cm/s 70.3 cm/s MV E/A ratio:  0.94        1.5   Nona Dell MD Electronically signed by Nona Dell MD Signature Date/Time: 07/29/2019/4:44:38 PM    Final          ______________________________________________________________________________________________      Risk Assessment/Calculations:             Physical Exam:   VS:  BP 118/82   Pulse 73   Ht 5\' 3"  (1.6 m)   Wt 206 lb 6.4 oz (93.6 kg)   SpO2 98%   BMI 36.56 kg/m    Wt Readings from Last 3 Encounters:  09/17/23 206 lb 6.4 oz (93.6 kg)  06/12/23 207 lb 9.6 oz (94.2 kg)  05/16/23 207 lb (93.9 kg)    GEN: Well nourished, well developed in  no acute distress NECK: No JVD; No carotid bruits CARDIAC: RRR, no murmurs, rubs, gallops RESPIRATORY:  Clear to auscultation without rales, wheezing or rhonchi  ABDOMEN: Soft, non-tender, non-distended EXTREMITIES:  No edema; No deformity   ASSESSMENT AND PLAN: .     Peripheral Arterial Disease History of right lateral toe amputation in 2006, bilateral common femoral artery and SFA disease, and left common iliac artery stenosis treated with lithotripsy and a self-expanding stent in October 2024. Improved ABI on the left side from 0.6 to 1.25 post-procedure. Currently on Aspirin and Plavix. -Continue Aspirin and Plavix until April 2025. -Repeat lower extremity ultrasound scheduled for November 26, 2023.  Left lower extremity edema Persistent left leg edema, worse since the last visit. No active drainage or blisters. The patient reports tightness and discomfort. The left leg is the site of previous vein harvesting. -Increase Lasix to 40mg  twice daily for three  days, then return to 40mg  daily. -Use the afternoon dose of Lasix as needed for leg edema. -Document the frequency of extra Lasix dose usage. -Consider venous ultrasound if swelling persists at follow-up.  Chronic Kidney Disease Stage 3, last creatinine 1.6. Concerns about the impact of Lasix on kidney function. -Monitor kidney function closely due to Lasix use. -Encourage water intake and avoidance of nephrotoxic medications.  Coronary Artery Disease History of CABG, chronic systolic heart failure, and hyperlipidemia. EF improved from 45-50% in 2019 to 55-60% in 2020. -Continue current cardiac medications.        Dispo: Keep follow-up with Dr. Greggory Keen in March, schedule follow-up with Dr. Kirke Corin in May  Signed, Azalee Course, [place]

## 2023-09-17 NOTE — Patient Instructions (Signed)
Medication Instructions:  TAKE 40 MG OF LASIX TWICE DAILY FOR 3 DAYS THEN BACK TO 40 MG DAILY, MAKE ALSO TAKE EXTRA DOSE OF LASIX IN THE AFTERNOON FOR LEG EDEMA  *If you need a refill on your cardiac medications before your next appointment, please call your pharmacy*   Lab Work: NO LABS If you have labs (blood work) drawn today and your tests are completely normal, you will receive your results only by: MyChart Message (if you have MyChart) OR A paper copy in the mail If you have any lab test that is abnormal or we need to change your treatment, we will call you to review the results.   Testing/Procedures: NO TESTING   Follow-Up: At [facility], you and your health needs are our priority.  As part of our continuing mission to provide you with exceptional heart care, we have created designated Provider Care Teams.  These Care Teams include your primary Cardiologist (physician) and Advanced Practice Providers (APPs -  Physician Assistants and Nurse Practitioners) who all work together to provide you with the care you need, when you need it.  Your next appointment:   KEEP FOLLOW UP October 09 2023  Provider:   Marjo Bicker, MD  AND ALSO F/U IN 3 MONTHS WITH Dr. Lorine Bears

## 2023-10-09 ENCOUNTER — Encounter: Payer: Self-pay | Admitting: Internal Medicine

## 2023-10-09 ENCOUNTER — Ambulatory Visit: Payer: Medicare HMO | Attending: Internal Medicine | Admitting: Internal Medicine

## 2023-10-09 VITALS — BP 114/62 | HR 76 | Ht 63.0 in | Wt 204.4 lb

## 2023-10-09 DIAGNOSIS — I5032 Chronic diastolic (congestive) heart failure: Secondary | ICD-10-CM | POA: Diagnosis not present

## 2023-10-09 NOTE — Patient Instructions (Signed)

## 2023-10-09 NOTE — Progress Notes (Addendum)
 Cardiology Office Note  Date: 10/09/2023   ID: Shanon Brow, DOB January 09, 1951, MRN [id number]  PCP:  Majel Homer, MD  Cardiologist:  Marjo Bicker, MD Electrophysiologist:  None    History of Present Illness: Kiara [name] is a 73 y.o. female known to have CAD s/p CABG in 2014 with repeat LHC in 2019 showing patent LIMA to LAD and possibly patent SVG to RPDA with normal LVEF, PAD s/p L common iliac artery stenting, carotid artery stenosis (1-39% LICA, 1 to 39% R ICA) type 1 diabetes mellitus, OSA on CPAP, CKD stage III AA presented to cardiology clinic for follow-up visit.  Patient was admitted with NSTEMI in 2019 and underwent LHC that showed patent LIMA to LAD and possibly patent SVG to RPDA. She was also noted to have severely elevated right and left heart pressures and severe pulmonary artery hypertension. Medical management was recommended at the time.  She underwent left common iliac artery stenting recently in October 2024 due to life-style limiting claudication in LLE.  Currently on DAPT, aspirin and Plavix.  Quit smoking in 12 years ago.  Follows with Dr. Kirke Corin.  She is here for follow-up visit.  She recently had ER visit/hospitalization in [facility] for hypoglycemia and hyperglycemia, she was diagnosed with dehydration and was administered 2 L and IV fluids.  She reported significant improvement and was discharged couple of days ago.  Denies having any angina or DOE.  She does have some swelling in her legs for which she was recommended to increase her Lasix regimen.  No throbbing pain/claudication the left lower extremity but she does have throbbing pain in her right lower extremity.  I reviewed her peripheral angiogram report performed in October 24, right lower extremity angiography was performed to the mid SFA.  She follows with spine surgeon for her spine issues.  Currently on DAPT for common iliac artery stenting.    Past Medical History:  Diagnosis  Date   Anemia    Anxiety    Arthritis    Carotid artery disease (HCC)    a. Last carotid study in 2014 showed 1-39% RICA, 40-59% LICA.   Chronic combined systolic (congestive) and diastolic (congestive) heart failure (HCC)    a. EF 40-45% in 2014. b. EF 30-35% in 07/2017.   CKD (chronic kidney disease), stage III (HCC)    Coronary artery disease    a. s/p CABG 2014. b. Cath 07/2017 for NSTEMI -> med rx.   Diabetes mellitus (HCC)    Diabetic peripheral neuropathy (HCC)    Eczema    GERD (gastroesophageal reflux disease)    H/O hiatal hernia    Hyperlipidemia    Hypertension    Myocardial infarction (HCC)    PONV (postoperative nausea and vomiting)    Sleep apnea    Umbilical hernia     Past Surgical History:  Procedure Laterality Date   ABDOMINAL AORTOGRAM W/LOWER EXTREMITY N/A 03/06/2018   Procedure: ABDOMINAL AORTOGRAM W/LOWER EXTREMITY;  Surgeon: Iran Ouch, MD;  Location: [facility];  Service: Cardiovascular;  Laterality: N/A;   ABDOMINAL AORTOGRAM W/LOWER EXTREMITY N/A 05/16/2023   Procedure: ABDOMINAL AORTOGRAM W/LOWER EXTREMITY;  Surgeon: Iran Ouch, MD;  Location: [facility];  Service: Cardiovascular;  Laterality: N/A;   ABDOMINAL HYSTERECTOMY     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     CARDIAC CATHETERIZATION     CARPAL TUNNEL RELEASE     CESAREAN SECTION  1983  CORONARY ARTERY BYPASS GRAFT N/A 04/11/2013   Procedure: CORONARY ARTERY BYPASS GRAFTING (CABG);  Surgeon: Alleen Borne, MD;  Location: [facility] OR;  Service: Open Heart Surgery;  Laterality: N/A;   EYE SURGERY Bilateral    laser   FRACTURE SURGERY Left 2005   PERIPHERAL INTRAVASCULAR LITHOTRIPSY  05/16/2023   Procedure: PERIPHERAL INTRAVASCULAR LITHOTRIPSY;  Surgeon: Iran Ouch, MD;  Location: [facility];  Service: Cardiovascular;;   PERIPHERAL VASCULAR INTERVENTION  05/16/2023   Procedure: PERIPHERAL VASCULAR INTERVENTION;  Surgeon: Iran Ouch, MD;  Location: [facility];  Service: Cardiovascular;;   RIGHT/LEFT HEART CATH AND CORONARY/GRAFT ANGIOGRAPHY N/A 08/06/2017   Procedure: RIGHT/LEFT HEART CATH AND CORONARY/GRAFT ANGIOGRAPHY;  Surgeon: Yvonne Kendall, MD;  Location: [facility];  Service: Cardiovascular;  Laterality: N/A;    Current Outpatient Medications  Medication Sig Dispense Refill   acetaminophen (TYLENOL) 650 MG CR tablet Take 1,300 mg by mouth every 8 (eight) hours as needed for pain.     albuterol (VENTOLIN HFA) 108 (90 Base) MCG/ACT inhaler Inhale 2 puffs into the lungs every 4 (four) hours as needed for shortness of breath or wheezing.     aspirin EC 81 MG tablet Take 81 mg by mouth daily.      Cholecalciferol (VITAMIN D) 2000 units tablet Take 2,000 Units by mouth daily.     clopidogrel (PLAVIX) 75 MG tablet Take 1 tablet (75 mg total) by mouth daily. 90 tablet 1   Coenzyme Q10 (COQ10) 100 MG CAPS Take 100 mg by mouth daily.      COLLAGEN PO Take 1,000 mg by mouth daily.     DULoxetine (CYMBALTA) 60 MG capsule Take 60 mg by mouth daily.     ezetimibe (ZETIA) 10 MG tablet TAKE 1 TABLET EVERY DAY 90 tablet 3   fluticasone (FLONASE) 50 MCG/ACT nasal spray Place 2 sprays into both nostrils at bedtime as needed for allergies or rhinitis.     furosemide (LASIX) 40 MG tablet Take 1 tablet (40 mg total) by mouth 2 (two) times daily. 180 tablet 2   gabapentin (NEURONTIN) 800 MG tablet Take 800 mg by mouth 3 (three) times daily.      insulin aspart (NOVOLOG) 100 UNIT/ML injection Inject into the skin See admin instructions. 1.25 Units 12 Am-7 Am,  1.75 u/hr 7 AM  Til 12 AM, Bolus 1 unit for every 15 carbs, 1 Unit for every 50 points greater than 150 Insulin pump     levocetirizine (XYZAL) 5 MG tablet Take 5 mg by mouth daily.      metoprolol succinate (TOPROL-XL) 25 MG 24 hr tablet TAKE 1 TABLET TWICE DAILY 180 tablet 3   montelukast (SINGULAIR) 10 MG tablet Take 10 mg by mouth daily.     omeprazole (PRILOSEC) 40 MG capsule  Take 40 mg by mouth daily.     rosuvastatin (CRESTOR) 40 MG tablet TAKE ONE TABLET BY MOUTH ONE TIME A DAY. STOP LIPITOR 90 tablet 3   Turmeric 500 MG TABS Take 1 tablet by mouth daily.     No current facility-administered medications for this visit.   Allergies:  Bactrim [sulfamethoxazole-trimethoprim], Codeine, Mobic [meloxicam], Morphine and codeine, Silvadene [silver sulfadiazine], and Adhesive [tape]   Social History: The patient  reports that she quit smoking about 13 years ago. Her smoking use included cigarettes. She started smoking about 38 years ago. She has never been exposed to tobacco smoke. She has never used smokeless tobacco. She reports that  she does not drink alcohol and does not use drugs.   Family History: The patient's family history includes Heart attack in her brother and mother; Heart disease in her brother and mother; Hypertension in her brother and mother.   ROS:  Please see the history of present illness. Otherwise, complete review of systems is positive for none.  All other systems are reviewed and negative.   Physical Exam: VS:  Wt 204 lb 6.4 oz (92.7 kg)   BMI 36.21 kg/m , BMI Body mass index is 36.21 kg/m.  Wt Readings from Last 3 Encounters:  10/09/23 204 lb 6.4 oz (92.7 kg)  09/17/23 206 lb 6.4 oz (93.6 kg)  06/12/23 207 lb 9.6 oz (94.2 kg)    General: Patient appears comfortable at rest. HEENT: Conjunctiva and lids normal, oropharynx clear with moist mucosa. Neck: Supple, no elevated JVP or carotid bruits, no thyromegaly. Lungs: Clear to auscultation, nonlabored breathing at rest. Cardiac: Regular rate and rhythm, no S3 or significant systolic murmur, no pericardial rub. Abdomen: Soft, nontender, no hepatomegaly, bowel sounds present, no guarding or rebound. Extremities: 1+ pitting edema in the LLE Skin: Warm and dry. Musculoskeletal: No kyphosis. Neuropsychiatric: Alert and oriented x3, affect grossly appropriate.  ECG: NSR  Recent Labwork: No  results found for requested labs within last 365 days.     Component Value Date/Time   CHOL 183 08/22/2017 1448   TRIG 143 08/22/2017 1448   HDL 36 (L) 08/22/2017 1448   CHOLHDL 5.1 08/22/2017 1448   VLDL 29 08/22/2017 1448   LDLCALC 118 (H) 08/22/2017 1448      Assessment and Plan:  CAD s/p CABG in 2014 with normal LVEF, angina free: No interval angina or DOE.  Continue cardioprotective medications, continue aspirin 81 mg once daily. rosuvastatin 40 mg nightly and Zetia 10 mg once daily. LHC in 2019 showed patent LIMA to LAD and possible patent SVG to RPDA.  ER precautions for chest pain provided.  Lower extremity PAD s/p L common iliac artery stenting in 05/2023, claudication free in the LLE but has claudication in the RLE: Currently on DAPT, aspirin 81 mg once daily and Plavix 75 mg once daily. I do think she needs Plavix anymore, will reach out to Dr Kirke Corin.  Will start Pletal 100 mg twice daily after discussing Plavix.  She continues to have right lower EXTR claudication, not lifestyle limiting yet. Addendum: Discussed with Dr Kirke Corin, d/c plavix and will start Pletal 100 mg BID for RLE claudication.  Chronic diastolic heart failure: Currently on p.o. Lasix 40 mg twice daily.  Does not complain of DOE but has 1+ pitting edema.  Encouraged to check weights daily.  HLD, at goal: Continue rosuvastatin 40 mg nightly, Zetia 10 mg once daily.  Goal LDL less than 55.  LDL from 2024 at PCPs office was at goal.  Type 1 diabetes mellitus: On insulin pump.  Recently had hospitalization for fluctuating glucose levels.  Will follow with endocrinology later on.    Disposition:  Follow up 1 year  Signed Willisha Sligar Verne Spurr, MD, 10/09/2023 8:54 AM    [facility] [address] [place], [place], [place] [address]

## 2023-10-23 ENCOUNTER — Telehealth: Payer: Self-pay

## 2023-10-23 NOTE — Telephone Encounter (Signed)
 Left message for patient to call back

## 2023-10-23 NOTE — Telephone Encounter (Signed)
-----   Message from Vishnu P Mallipeddi sent at 10/23/2023 10:20 AM EDT ----- Regarding: D/c Plavix and start Pletal Can you stop Plavix and start Pletal 100 mg twice daily.  Side effect includes palpitations.  Let patient know that Dr Kirke Corin is okay with stopping Plavix.

## 2023-10-25 NOTE — Telephone Encounter (Signed)
 Left message for patient to call the office

## 2023-11-05 ENCOUNTER — Other Ambulatory Visit: Payer: Self-pay | Admitting: Cardiology

## 2023-11-05 ENCOUNTER — Encounter: Payer: Self-pay | Admitting: Internal Medicine

## 2023-11-05 ENCOUNTER — Other Ambulatory Visit: Payer: Self-pay | Admitting: Cardiovascular Disease

## 2023-11-05 NOTE — Telephone Encounter (Signed)
This is a Nurse, mental health pt

## 2023-11-05 NOTE — Telephone Encounter (Signed)
 Refill request

## 2023-11-06 ENCOUNTER — Other Ambulatory Visit: Payer: Self-pay | Admitting: Internal Medicine

## 2023-11-06 MED ORDER — CILOSTAZOL 100 MG PO TABS: 100.0000 mg | ORAL_TABLET | Freq: Two times a day (BID) | ORAL | 1 refills | Status: DC

## 2023-11-09 ENCOUNTER — Telehealth: Payer: Self-pay | Admitting: Pharmacy Technician

## 2023-11-09 NOTE — Telephone Encounter (Signed)
 Pharmacy Patient Advocate Encounter   Received notification from Onbase that prior authorization for cilostazol is required/requested.   Insurance verification completed.   The patient is insured through [employer] .   Per test claim: PA required; PA submitted to above mentioned insurance via CoverMyMeds Key/confirmation #/EOC [id number] Status is pending

## 2023-11-09 NOTE — Telephone Encounter (Signed)
 Pharmacy Patient Advocate Encounter  Received notification from [facility] that Prior Authorization for cilostazol has been APPROVED from 08/01/2023 to 07/30/24   PA #/Case ID/Reference #: [id number]

## 2023-11-14 LAB — HEMOGLOBIN A1C: Hemoglobin A1C: 10.6

## 2023-11-26 ENCOUNTER — Ambulatory Visit (HOSPITAL_COMMUNITY)
Admission: RE | Admit: 2023-11-26 | Discharge: 2023-11-26 | Disposition: A | Discharge status: Home / Self Care | Payer: Medicare HMO | Source: Ambulatory Visit | Attending: Cardiovascular Disease | Admitting: Cardiovascular Disease

## 2023-11-26 DIAGNOSIS — I739 Peripheral vascular disease, unspecified: Secondary | ICD-10-CM

## 2023-11-26 NOTE — Patient Instructions (Signed)

## 2023-11-27 ENCOUNTER — Encounter: Payer: Self-pay | Admitting: Nurse Practitioner

## 2023-11-27 ENCOUNTER — Telehealth: Payer: Self-pay | Admitting: Nurse Practitioner

## 2023-11-27 ENCOUNTER — Ambulatory Visit (INDEPENDENT_AMBULATORY_CARE_PROVIDER_SITE_OTHER): Payer: Self-pay | Admitting: Nurse Practitioner

## 2023-11-27 VITALS — BP 122/80 | HR 96 | Ht 63.0 in | Wt 205.8 lb

## 2023-11-27 DIAGNOSIS — E782 Mixed hyperlipidemia: Secondary | ICD-10-CM

## 2023-11-27 DIAGNOSIS — I10 Essential (primary) hypertension: Secondary | ICD-10-CM

## 2023-11-27 DIAGNOSIS — E1022 Type 1 diabetes mellitus with diabetic chronic kidney disease: Secondary | ICD-10-CM | POA: Diagnosis not present

## 2023-11-27 DIAGNOSIS — Z794 Long term (current) use of insulin: Secondary | ICD-10-CM | POA: Diagnosis not present

## 2023-11-27 DIAGNOSIS — E559 Vitamin D deficiency, unspecified: Secondary | ICD-10-CM | POA: Diagnosis not present

## 2023-11-27 DIAGNOSIS — N1832 Chronic kidney disease, stage 3b: Secondary | ICD-10-CM

## 2023-11-27 NOTE — Progress Notes (Signed)
 Endocrinology Consult Note       11/27/2023, 12:50 PM   Subjective:    Patient ID: Kiara [name], female    DOB: [birth year].  Kiara [name] is being seen in consultation for management of currently uncontrolled symptomatic diabetes requested by  Harolyn Likes, MD.   Past Medical History:  Diagnosis Date   Anemia    Anxiety    Arthritis    Carotid artery disease (HCC)    a. Last carotid study in 2014 showed 1-39% RICA, 40-59% LICA.   Chronic combined systolic (congestive) and diastolic (congestive) heart failure (HCC)    a. EF 40-45% in 2014. b. EF 30-35% in 07/2017.   CKD (chronic kidney disease), stage III (HCC)    Coronary artery disease    a. s/p CABG 2014. b. Cath 07/2017 for NSTEMI -> med rx.   Diabetes mellitus (HCC)    Diabetic peripheral neuropathy (HCC)    Eczema    GERD (gastroesophageal reflux disease)    H/O hiatal hernia    Hyperlipidemia    Hypertension    Myocardial infarction (HCC)    PONV (postoperative nausea and vomiting)    Sleep apnea    Umbilical hernia     Past Surgical History:  Procedure Laterality Date   ABDOMINAL AORTOGRAM W/LOWER EXTREMITY N/A 03/06/2018   Procedure: ABDOMINAL AORTOGRAM W/LOWER EXTREMITY;  Surgeon: Wenona Hamilton, MD;  Location: [facility];  Service: Cardiovascular;  Laterality: N/A;   ABDOMINAL AORTOGRAM W/LOWER EXTREMITY N/A 05/16/2023   Procedure: ABDOMINAL AORTOGRAM W/LOWER EXTREMITY;  Surgeon: Wenona Hamilton, MD;  Location: [facility];  Service: Cardiovascular;  Laterality: N/A;   ABDOMINAL HYSTERECTOMY     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     CARDIAC CATHETERIZATION     CARPAL TUNNEL RELEASE     CESAREAN SECTION  1983   CORONARY ARTERY BYPASS GRAFT N/A 04/11/2013   Procedure: CORONARY ARTERY BYPASS GRAFTING (CABG);  Surgeon: Bartley Lightning, MD;  Location: [facility] OR;  Service: Open Heart Surgery;  Laterality: N/A;   EYE  SURGERY Bilateral    laser   FRACTURE SURGERY Left 2005   PERIPHERAL INTRAVASCULAR LITHOTRIPSY  05/16/2023   Procedure: PERIPHERAL INTRAVASCULAR LITHOTRIPSY;  Surgeon: Wenona Hamilton, MD;  Location: [facility];  Service: Cardiovascular;;   PERIPHERAL VASCULAR INTERVENTION  05/16/2023   Procedure: PERIPHERAL VASCULAR INTERVENTION;  Surgeon: Wenona Hamilton, MD;  Location: [facility];  Service: Cardiovascular;;   RIGHT/LEFT HEART CATH AND CORONARY/GRAFT ANGIOGRAPHY N/A 08/06/2017   Procedure: RIGHT/LEFT HEART CATH AND CORONARY/GRAFT ANGIOGRAPHY;  Surgeon: Sammy Crisp, MD;  Location: [facility];  Service: Cardiovascular;  Laterality: N/A;    Social History   Socioeconomic History   Marital status: Widowed    Spouse name: Not on file   Number of children: Not on file   Years of education: Not on file   Highest education level: Not on file  Occupational History   Not on file  Tobacco Use   Smoking status: Former    Current packs/day: 0.00    Types: Cigarettes    Start date: 05/30/1985    Quit date: 05/30/2010  Years since quitting: 13.5    Passive exposure: Never   Smokeless tobacco: Never   Tobacco comments:    STOPPED 2007  Vaping Use   Vaping status: Never Used  Substance and Sexual Activity   Alcohol use: No   Drug use: No   Sexual activity: Not on file  Other Topics Concern   Not on file  Social History Narrative   Not on file   Social Drivers of Health   Financial Resource Strain: Not on file  Food Insecurity: Not on file  Transportation Needs: Not on file  Physical Activity: Not on file  Stress: Not on file  Social Connections: Not on file    Family History  Problem Relation Age of Onset   Heart attack Mother    Heart disease Mother    Hypertension Mother    Heart attack Brother    Heart disease Brother    Hypertension Brother     Outpatient Encounter Medications as of 11/27/2023  Medication Sig   acetaminophen   (TYLENOL ) 650 MG CR tablet Take 1,300 mg by mouth every 8 (eight) hours as needed for pain.   albuterol  (VENTOLIN  HFA) 108 (90 Base) MCG/ACT inhaler Inhale 2 puffs into the lungs every 4 (four) hours as needed for shortness of breath or wheezing.   aspirin  EC 81 MG tablet Take 81 mg by mouth daily.    Cholecalciferol (VITAMIN D) 2000 units tablet Take 2,000 Units by mouth daily.   cilostazol  (PLETAL ) 100 MG tablet Take 1 tablet (100 mg total) by mouth 2 (two) times daily.   Cinnamon 500 MG capsule Take 500 mg by mouth daily.   Coenzyme Q10 (COQ10) 100 MG CAPS Take 100 mg by mouth daily.    COLLAGEN PO Take 1,000 mg by mouth daily.   DULoxetine (CYMBALTA) 60 MG capsule Take 60 mg by mouth daily.   ezetimibe  (ZETIA ) 10 MG tablet TAKE 1 TABLET EVERY DAY   fluticasone  (FLONASE ) 50 MCG/ACT nasal spray Place 2 sprays into both nostrils at bedtime as needed for allergies or rhinitis.   furosemide  (LASIX ) 40 MG tablet Take 1 tablet (40 mg total) by mouth 2 (two) times daily.   gabapentin  (NEURONTIN ) 800 MG tablet Take 800 mg by mouth 3 (three) times daily.    insulin  aspart (NOVOLOG ) 100 UNIT/ML injection Inject into the skin See admin instructions. 1.25 Units 12 Am-7 Am,  1.75 u/hr 7 AM  Til 12 AM, Bolus 1 unit for every 15 carbs, 1 Unit for every 50 points greater than 150 Insulin  pump   levocetirizine (XYZAL ) 5 MG tablet Take 5 mg by mouth daily.    metoprolol  succinate (TOPROL -XL) 25 MG 24 hr tablet TAKE 1 TABLET TWICE DAILY   montelukast (SINGULAIR) 10 MG tablet Take 10 mg by mouth daily.   neomycin-polymyxin b-dexamethasone  (MAXITROL) 3.5-10000-0.1 OINT Place 1 Application into both eyes at bedtime.   omeprazole (PRILOSEC) 40 MG capsule Take 40 mg by mouth daily.   rosuvastatin  (CRESTOR ) 40 MG tablet TAKE ONE TABLET BY MOUTH ONE TIME A DAY. STOP LIPITOR    Turmeric 500 MG TABS Take 1 tablet by mouth daily.   No facility-administered encounter medications on file as of 11/27/2023.     ALLERGIES: Allergies  Allergen Reactions   Bactrim [Sulfamethoxazole-Trimethoprim] Rash   Codeine Nausea And Vomiting   Mobic [Meloxicam] Swelling   Morphine  And Codeine Nausea Only and Other (See Comments)    confusion   Silvadene [Silver Sulfadiazine]     Red hot rash  and blisters   Adhesive [Tape] Rash    Band aid brand      VACCINATION STATUS: Immunization History  Administered Date(s) Administered   Moderna Sars-Covid-2 Vaccination 10/13/2019, 12/16/2019    Diabetes She presents for her initial diabetic visit. She has type 1 diabetes mellitus. Onset time: diagnosed at age 35. Her disease course has been fluctuating. Hypoglycemia symptoms include nervousness/anxiousness, sweats and tremors. Associated symptoms include blurred vision and fatigue. There are no hypoglycemic complications. Symptoms are stable. Diabetic complications include heart disease, nephropathy, peripheral neuropathy, PVD and retinopathy. Risk factors for coronary artery disease include diabetes mellitus, family history, hypertension, obesity, sedentary lifestyle, post-menopausal and tobacco exposure. Current diabetic treatment includes insulin  pump (Medtronic). She is compliant with treatment most of the time. Her weight is fluctuating minimally. She is following a generally healthy diet. When asked about meal planning, she reported none. She has not had a previous visit with a dietitian. She rarely participates in exercise. Her home blood glucose trend is decreasing steadily. Her overall blood glucose range is 180-200 mg/dl. (She presents today, accompanied by a friend, with her CGM and pump showing fluctuating glycemic profile with near target averages.  Her most recent A1c on 4/16 was 10.6%, improving from last A1c of 13.5%.  She notes after her last hospitalization, she really made a change in her diet.  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  She uses Libre 2 currently and Medtronic insulin  pump.  She has used this pump manufacturer for the last 15-18 years.  She is interested in learning more about the Omnipod 5.  Her son is also a patient of mine and wears a Omnipod 5 pump.  She drinks water with SF flavorings, coffee with stevia and a small bit of creamer, and diet green tea.  She eats 3 meals per day and sometimes snacks.  She does not engage in routine physical activity, has limitations from other health issues.  She does see eye doctor routinely (has retinopathy), and also sees podiatry routinely as well.  Analysis of her CGM shows TIR 46%, TAR 54%, TBR 0% with a GMI of 7.8%.  She does have a tendency to over-correct when her Jerrilyn Moras warns that a low is coming.) An ACE inhibitor/angiotensin II receptor blocker is not being taken. She sees a podiatrist.Eye exam is current.     Review of systems  Constitutional: + Minimally fluctuating body weight, current Body mass index is 36.46 kg/m., no fatigue, no subjective hyperthermia, no subjective hypothermia Eyes: + blurry vision- hx retinopathy, no xerophthalmia ENT: no sore throat, no nodules palpated in throat, no dysphagia/odynophagia, no hoarseness Cardiovascular: no chest pain, no shortness of breath, no palpitations, + leg swelling Respiratory: no cough, no shortness of breath Gastrointestinal: no nausea/vomiting/diarrhea, intermittent constipation Musculoskeletal: + diffuse muscle/joint aches- most recently shoulder pain/weakness Skin: no rashes, no hyperemia Neurological: no tremors, no numbness, no tingling, no dizziness Psychiatric: no depression, no anxiety  Objective:     BP 122/80 (BP Location: Left Arm, Patient Position: Sitting)   Pulse 96   Ht 5\' 3"  (1.6 m)   Wt 205 lb 12.8 oz (93.4 kg)   BMI 36.46 kg/m   Wt Readings from Last 3 Encounters:  11/27/23 205 lb 12.8 oz (93.4 kg)  10/09/23 204 lb 6.4 oz (92.7 kg)  09/17/23 206 lb  6.4 oz (93.6 kg)  BP Readings from Last 3 Encounters:  11/27/23 122/80  10/09/23 114/62  09/17/23 118/82     Physical Exam- Limited  Constitutional:  Body mass index is 36.46 kg/m. 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, strength intact in all four extremities, no gross restriction of joint movements Skin:  no rashes, no hyperemia Neurological: no tremor with outstretched hands   Diabetic Foot Exam - Simple   No data filed      CMP ( most recent) CMP     Component Value Date/Time   NA 139 07/28/2019 1448   NA 140 02/19/2018 1152   K 4.7 07/28/2019 1448   CL 101 07/28/2019 1448   CO2 28 07/28/2019 1448   GLUCOSE 174 (H) 07/28/2019 1448   BUN 30 (A) 07/31/2023 0000   CREATININE 1.7 (A) 07/31/2023 0000   CREATININE 1.63 (H) 07/28/2019 1448   CALCIUM  9.0 07/28/2019 1448   PROT 8.0 08/22/2017 1447   ALBUMIN  3.4 (L) 08/22/2017 1447   AST 28 08/22/2017 1447   ALT 20 08/22/2017 1447   ALKPHOS 94 08/22/2017 1447   BILITOT 0.5 08/22/2017 1447   EGFR 32 07/31/2023 0000   GFRNONAA 32 (L) 07/28/2019 1448     Diabetic Labs (most recent): Lab Results  Component Value Date   HGBA1C 10.6 11/14/2023   HGBA1C 13.5 07/31/2023   HGBA1C 11.8 01/10/2023     Lipid Panel ( most recent) Lipid Panel     Component Value Date/Time   CHOL 183 08/22/2017 1448   TRIG 133 07/31/2023 0000   HDL 36 (L) 08/22/2017 1448   CHOLHDL 5.1 08/22/2017 1448   VLDL 29 08/22/2017 1448   LDLCALC 49 07/31/2023 0000      Lab Results  Component Value Date   TSH 2.066 07/28/2019           Assessment & Plan:   1) 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  Her most recent A1c on 4/16 was 10.6%, improving from last A1c of 13.5%.  She notes after her last hospitalization, she really made a change in her diet.  She was previously a caregiver for her husband and his father, but both have since passed for which she can now focus more on her own health.  She uses Libre 2 currently and Medtronic insulin  pump.  She has used this pump manufacturer for the last 15-18 years.  She is interested in learning more about the Omnipod 5.  Her son is also a patient of mine and wears a Omnipod 5 pump.  She drinks water with SF flavorings, coffee with stevia and a small bit of creamer, and diet green tea.  She eats 3 meals per day and sometimes snacks.  She does not engage in routine physical activity, has limitations from other health issues.  She does see eye doctor routinely (has retinopathy), and also sees podiatry routinely as well.  Analysis of her CGM shows TIR 46%, TAR 54%, TBR 0% with a GMI of 7.8%.  She does have a tendency to over-correct when her Jerrilyn Moras warns that a low is coming.  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   -Recent labs reviewed.  - I had a long discussion with her about the progressive nature of diabetes and the pathology behind its complications. -her diabetes is complicated by CHF, CKD stage 3b, CAD with MI, retinopathy, neuropathy, PVD.  The following Lifestyle Medicine recommendations according to American College of Lifestyle Medicine [facility]) were discussed and offered to patient and she agrees to start the journey:  A. Whole Foods, Plant-based plate comprising of fruits and vegetables, plant-based proteins, whole-grain carbohydrates was discussed in detail with the patient.   A list for source of those nutrients were also provided to the patient.   Patient will use only water or unsweetened tea for hydration. B.  The need to stay away from risky substances including alcohol, smoking; obtaining 7 to 9 hours of restorative sleep, at least 150 minutes of moderate intensity exercise weekly, the importance of healthy social connections,  and stress reduction techniques were discussed. C.  A full color page of  Calorie density of various food groups per pound showing examples of each food groups was provided to the patient.  - I have counseled her on diet and weight management by adopting a carbohydrate restricted/protein rich diet. Patient is encouraged to switch to unprocessed or minimally processed complex starch and increased protein intake (animal or plant source), fruits, and vegetables. -  she is advised to stick to a routine mealtimes to eat 3 meals a day and avoid unnecessary snacks (to snack only to correct hypoglycemia).   - she acknowledges that there is a room for improvement in her food and drink choices. - Suggestion is made for her to avoid simple carbohydrates from her diet including Cakes, Sweet Desserts, Ice Cream, Soda (diet and regular), Sweet Tea, Candies, Chips, Cookies, Store Bought Juices, Alcohol in Excess of 1-2 drinks a day, Artificial Sweeteners, Coffee Creamer, and "Sugar-free" Products. This will help patient to have more stable blood glucose profile and potentially avoid unintended weight gain.  - I have approached her with the following individualized plan to manage her diabetes and patient agrees:   - I did make slight adjustment to her Medtronic pump settings today.  I changed her 7a-12a basal rate to 1.55 units per hour instead of 2 units per hour given she has had some recent drops.  She can continue other settings: target 150; insulin  to carb ratio: 1:15, insulin  sensitivity of 50, active insulin  time of 4 hrs.  -she is encouraged to start/continue monitoring glucose 4 times daily (using her CGM), before meals and  before bed, to log their readings on the clinic sheets provided, and bring them to review at follow up appointment in 3 months.  We did discuss potentially changing her over to Dexcom G7.  She changes insurance in a few days, will wait until this is in effect before sending in.  She is possibly interested in changing to Omnipod.  - she is warned not to take insulin  without proper monitoring per orders. - Adjustment parameters are given to her for hypo and hyperglycemia in writing. - she is encouraged to call clinic for blood glucose levels less than 70 or above 300 mg /dl.  -She is not a candidate for non-insulin  therapies given her type 1 diagnosis.  - Specific targets for  A1c; LDL, HDL, and Triglycerides were discussed with the patient.  2) Blood Pressure /Hypertension:  her blood  pressure is controlled to target.   she is advised to continue her current medications as prescribed by her PCP.  3) Lipids/Hyperlipidemia:    Review of her recent lipid panel from 07/31/23 showed controlled LDL at 49 .  she is advised to continue Crestor  40 mg daily at bedtime.  Side effects and precautions discussed with her.  4)  Weight/Diet:  her Body mass index is 36.46 kg/m.  -  clearly complicating her diabetes care.   she is a candidate for weight loss. I discussed with her the fact that loss of 5 - 10% of her  current body weight will have the most impact on her diabetes management.  Exercise, and detailed carbohydrates information provided  -  detailed on discharge instructions.  5) Chronic Care/Health Maintenance: -she is not on ACEI/ARB and is on Statin medications and is encouraged to initiate and continue to follow up with Ophthalmology, Dentist, Podiatrist at least yearly or according to recommendations, and advised to stay away from smoking (quit over 30 years ago). I have recommended yearly flu vaccine and pneumonia vaccine at least every 5 years; moderate intensity exercise for up to 150 minutes  weekly; and sleep for at least 7 hours a day.  - she is advised to maintain close follow up with Harolyn Likes, MD for primary care needs, as well as her other providers for optimal and coordinated care.   - Time spent in this patient care: 60 min, which was spent in counseling her about her diabetes and the rest reviewing her blood glucose logs, discussing her hypoglycemia and hyperglycemia episodes, reviewing her current and previous labs/studies (including abstraction from other facilities) and medications doses and developing a long term treatment plan based on the latest standards of care/guidelines; and documenting her care.    Please refer to Patient Instructions for Blood Glucose Monitoring and Insulin /Medications Dosing Guide" in media tab for additional information. Please also refer to "Patient Self Inventory" in the Media tab for reviewed elements of pertinent patient history.  Kiara [name] participated in the discussions, expressed understanding, and voiced agreement with the above plans.  All questions were answered to her satisfaction. she is encouraged to contact clinic should she have any questions or concerns prior to her return visit.     Follow up plan: - Return in about 3 months (around 02/26/2024) for Diabetes F/U with A1c in office, No previsit labs, Bring meter and logs.    Hulon Magic, [facility] [address] [place], [place] [address] Phone: [phone] Fax: [phone]  11/27/2023, 12:50 PM

## 2023-11-27 NOTE — Telephone Encounter (Signed)
 I have scanned her [employer] card under media. She said this is effective May 1st and you needed it for Supplies

## 2023-11-27 NOTE — Telephone Encounter (Signed)
 Thank you.  She should be sending me a reminder message in a week so I can try sending in her CGM stuff.

## 2023-12-07 ENCOUNTER — Encounter: Payer: Self-pay | Admitting: Nurse Practitioner

## 2023-12-07 LAB — VAS US ABI WITH/WO TBI
Left ABI: 0.85
Right ABI: 0.69

## 2023-12-10 ENCOUNTER — Other Ambulatory Visit: Payer: Self-pay | Admitting: Internal Medicine

## 2023-12-10 MED ORDER — CILOSTAZOL 100 MG PO TABS: 100.0000 mg | ORAL_TABLET | Freq: Two times a day (BID) | ORAL | 1 refills | Status: DC

## 2023-12-10 MED ORDER — EZETIMIBE 10 MG PO TABS: 10.0000 mg | ORAL_TABLET | Freq: Every day | ORAL | 3 refills | Status: AC

## 2023-12-11 ENCOUNTER — Ambulatory Visit: Payer: Medicare PPO | Admitting: Cardiovascular Disease

## 2023-12-11 ENCOUNTER — Ambulatory Visit: Payer: Self-pay | Admitting: *Deleted

## 2023-12-25 ENCOUNTER — Ambulatory Visit: Attending: Cardiovascular Disease | Admitting: Cardiovascular Disease

## 2023-12-25 ENCOUNTER — Encounter: Payer: Self-pay | Admitting: Cardiovascular Disease

## 2023-12-25 VITALS — BP 124/84 | HR 92 | Ht 63.0 in | Wt 199.6 lb

## 2023-12-25 DIAGNOSIS — I5022 Chronic systolic (congestive) heart failure: Secondary | ICD-10-CM | POA: Diagnosis not present

## 2023-12-25 DIAGNOSIS — I739 Peripheral vascular disease, unspecified: Secondary | ICD-10-CM

## 2023-12-25 DIAGNOSIS — I6523 Occlusion and stenosis of bilateral carotid arteries: Secondary | ICD-10-CM

## 2023-12-25 DIAGNOSIS — I25810 Atherosclerosis of coronary artery bypass graft(s) without angina pectoris: Secondary | ICD-10-CM | POA: Diagnosis not present

## 2023-12-25 DIAGNOSIS — E785 Hyperlipidemia, unspecified: Secondary | ICD-10-CM

## 2023-12-25 NOTE — Progress Notes (Unsigned)
 Cardiology Office Note   Date:  12/26/2023   ID:  Kiara [name], DOB 04-19-1951, MRN [id number]  PCP:  Harolyn Likes, MD  Cardiologist: Dr. Arthea Larsson  No chief complaint on file.     History of Present Illness: Kiara [name] is a 73 y.o. female who is here today for follow-up visit regarding peripheral arterial disease . She has known history of coronary artery disease status post CABG, chronic systolic heart failure, diabetes mellitus, hyperlipidemia and diabetes mellitus.  She is been diabetic for 60 years.  She had amputation of the right lateral toes in 2006.  She also has lumbar spine disease. She had a non-ST elevation myocardial infarction in January of 2019.  Cardiac catheterization via the right femoral artery showed severe native vessel coronary artery disease with patent LIMA to LAD and patent SVG to right PDA.  Filling pressures were severely elevated with severe pulmonary hypertension.  Medical therapy was recommended.   She was seen in 2019 for severe bilateral leg claudication. Vascular studies showed an ABI of 0.92 on the right and 0.93 on the left.     Duplex showed moderate bilateral common femoral artery and SFA disease. CO2 angiography was performed in August 2019 which showed mild disease affecting the right common femoral and SFA disease with three-vessel runoff below the knee.  In the left, there was moderate calcified common iliac artery stenosis and moderate distal SFA disease.  The disease was not significant enough to explain severity of her symptoms.  She had worsening left leg claudication last year.  Vascular testing in April 2024 showed a drop on the left side to 0.60.  It was previously 0.88.   I proceeded with angiography in October 2024 which showed severe heavily calcified eccentric stenosis in the left common iliac artery.  This was treated successfully with intravascular lithotripsy and self-expanding stent placement. Most recent Doppler studies  in April showed an ABI of 0.69 on the right and 0.85 on the left.  Duplex showed evidence of severe right distal external iliac artery stenosis with peak velocity of 555.  Left common iliac stent was patent. She reports worsening bilateral leg claudication with minimal walking especially on the right side.  She also had recent swelling of her left leg which improved after improving her diet.  She needs to have left eye surgery in the near future.   Past Medical History:  Diagnosis Date   Anemia    Anxiety    Arthritis    Carotid artery disease (HCC)    a. Last carotid study in 2014 showed 1-39% RICA, 40-59% LICA.   Chronic combined systolic (congestive) and diastolic (congestive) heart failure (HCC)    a. EF 40-45% in 2014. b. EF 30-35% in 07/2017.   CKD (chronic kidney disease), stage III (HCC)    Coronary artery disease    a. s/p CABG 2014. b. Cath 07/2017 for NSTEMI -> med rx.   Diabetes mellitus (HCC)    Diabetic peripheral neuropathy (HCC)    Eczema    GERD (gastroesophageal reflux disease)    H/O hiatal hernia    Hyperlipidemia    Hypertension    Myocardial infarction (HCC)    PONV (postoperative nausea and vomiting)    Sleep apnea    Umbilical hernia     Past Surgical History:  Procedure Laterality Date   ABDOMINAL AORTOGRAM W/LOWER EXTREMITY N/A 03/06/2018   Procedure: ABDOMINAL AORTOGRAM W/LOWER EXTREMITY;  Surgeon: Wenona Hamilton, MD;  Location: [facility]  INVASIVE CV LAB;  Service: Cardiovascular;  Laterality: N/A;   ABDOMINAL AORTOGRAM W/LOWER EXTREMITY N/A 05/16/2023   Procedure: ABDOMINAL AORTOGRAM W/LOWER EXTREMITY;  Surgeon: Wenona Hamilton, MD;  Location: [facility] INVASIVE CV LAB;  Service: Cardiovascular;  Laterality: N/A;   ABDOMINAL HYSTERECTOMY     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     CARDIAC CATHETERIZATION     CARPAL TUNNEL RELEASE     CESAREAN SECTION  1983   CORONARY ARTERY BYPASS GRAFT N/A 04/11/2013   Procedure: CORONARY ARTERY BYPASS GRAFTING (CABG);   Surgeon: Bartley Lightning, MD;  Location: [facility] OR;  Service: Open Heart Surgery;  Laterality: N/A;   EYE SURGERY Bilateral    laser   FRACTURE SURGERY Left 2005   PERIPHERAL INTRAVASCULAR LITHOTRIPSY  05/16/2023   Procedure: PERIPHERAL INTRAVASCULAR LITHOTRIPSY;  Surgeon: Wenona Hamilton, MD;  Location: [facility] INVASIVE CV LAB;  Service: Cardiovascular;;   PERIPHERAL VASCULAR INTERVENTION  05/16/2023   Procedure: PERIPHERAL VASCULAR INTERVENTION;  Surgeon: Wenona Hamilton, MD;  Location: [facility] INVASIVE CV LAB;  Service: Cardiovascular;;   RIGHT/LEFT HEART CATH AND CORONARY/GRAFT ANGIOGRAPHY N/A 08/06/2017   Procedure: RIGHT/LEFT HEART CATH AND CORONARY/GRAFT ANGIOGRAPHY;  Surgeon: Sammy Crisp, MD;  Location: [facility] INVASIVE CV LAB;  Service: Cardiovascular;  Laterality: N/A;     Current Outpatient Medications  Medication Sig Dispense Refill   acetaminophen  (TYLENOL ) 650 MG CR tablet Take 1,300 mg by mouth every 8 (eight) hours as needed for pain.     albuterol  (VENTOLIN  HFA) 108 (90 Base) MCG/ACT inhaler Inhale 2 puffs into the lungs every 4 (four) hours as needed for shortness of breath or wheezing.     aspirin  EC 81 MG tablet Take 81 mg by mouth daily.      Cholecalciferol (VITAMIN D ) 2000 units tablet Take 2,000 Units by mouth daily.     cilostazol  (PLETAL ) 100 MG tablet Take 1 tablet (100 mg total) by mouth 2 (two) times daily. 180 tablet 1   Cinnamon 500 MG capsule Take 500 mg by mouth daily.     Coenzyme Q10 (COQ10) 100 MG CAPS Take 100 mg by mouth daily.      COLLAGEN PO Take 1,000 mg by mouth daily.     DULoxetine (CYMBALTA) 60 MG capsule Take 60 mg by mouth daily.     ezetimibe  (ZETIA ) 10 MG tablet Take 1 tablet (10 mg total) by mouth daily. 90 tablet 3   fluticasone  (FLONASE ) 50 MCG/ACT nasal spray Place 2 sprays into both nostrils at bedtime as needed for allergies or rhinitis.     furosemide  (LASIX ) 40 MG tablet Take 1 tablet (40 mg total) by mouth 2 (two) times daily. 180 tablet 2    gabapentin  (NEURONTIN ) 800 MG tablet Take 800 mg by mouth 3 (three) times daily.      insulin  aspart (NOVOLOG ) 100 UNIT/ML injection Inject into the skin See admin instructions. 1.25 Units 12 Am-7 Am,  1.75 u/hr 7 AM  Til 12 AM, Bolus 1 unit for every 15 carbs, 1 Unit for every 50 points greater than 150 Insulin  pump     levocetirizine (XYZAL ) 5 MG tablet Take 5 mg by mouth daily.      metoprolol  succinate (TOPROL -XL) 25 MG 24 hr tablet TAKE 1 TABLET TWICE DAILY 180 tablet 3   montelukast (SINGULAIR) 10 MG tablet Take 10 mg by mouth daily.     neomycin-polymyxin b-dexamethasone  (MAXITROL) 3.5-10000-0.1 OINT Place 1 Application into both eyes at bedtime.     omeprazole (  PRILOSEC) 40 MG capsule Take 40 mg by mouth daily.     rosuvastatin  (CRESTOR ) 40 MG tablet TAKE ONE TABLET BY MOUTH ONE TIME A DAY. STOP LIPITOR  90 tablet 3   Turmeric 500 MG TABS Take 1 tablet by mouth daily.     No current facility-administered medications for this visit.    Allergies:   Bactrim [sulfamethoxazole-trimethoprim], Codeine, Mobic [meloxicam], Morphine  and codeine, Silvadene [silver sulfadiazine], and Adhesive [tape]    Social History:  The patient  reports that she quit smoking about 13 years ago. Her smoking use included cigarettes. She started smoking about 38 years ago. She has never been exposed to tobacco smoke. She has never used smokeless tobacco. She reports that she does not drink alcohol and does not use drugs.   Family History:  The patient's family history includes Heart attack in her brother and mother; Heart disease in her brother and mother; Hypertension in her brother and mother.    ROS:  Please see the history of present illness.   Otherwise, review of systems are positive for none.   All other systems are reviewed and negative.    PHYSICAL EXAM: VS:  BP 124/84   Pulse 92   Ht 5\' 3"  (1.6 m)   Wt 199 lb 9.6 oz (90.5 kg)   SpO2 94%   BMI 35.36 kg/m  , BMI Body mass index is 35.36  kg/m. GEN: Well nourished, well developed, in no acute distress  HEENT: normal  Neck: no JVD, carotid bruits, or masses Cardiac: RRR; no murmurs, rubs, or gallops,no edema  Respiratory:  clear to auscultation bilaterally, normal work of breathing GI: soft, nontender, nondistended, + BS MS: no deformity or atrophy  Skin: warm and dry, no rash Neuro:  Strength and sensation are intact Psych: euthymic mood, full affect Vascular: Femoral pulse: Diminished bilaterally but stronger on the left side.   EKG:  EKG is not ordered today.    Recent Labs: 07/31/2023: BUN 30; Creatinine 1.7    Lipid Panel    Component Value Date/Time   CHOL 183 08/22/2017 1448   TRIG 133 07/31/2023 0000   HDL 36 (L) 08/22/2017 1448   CHOLHDL 5.1 08/22/2017 1448   VLDL 29 08/22/2017 1448   LDLCALC 49 07/31/2023 0000      Wt Readings from Last 3 Encounters:  12/25/23 199 lb 9.6 oz (90.5 kg)  11/27/23 205 lb 12.8 oz (93.4 kg)  10/09/23 204 lb 6.4 oz (92.7 kg)           No data to display            ASSESSMENT AND PLAN:  1.  Peripheral arterial disease: Status post left common iliac artery stent placement.  She had initial improvement of claudication shortly after the procedure.  However, most recently she reports recurrent bilateral leg claudication especially on the right side with a drop in ABI.there is a new stenosis in the right external iliac artery.  There might be some degree of stenosis in the left common iliac artery as well.  I suggested proceeding with a structured exercise therapy program but she reports inability to participate due to severity of her symptoms and also low back pain.   The plan is to proceed with angiography and possible endovascular intervention in the near future.  She wants to deal with her left eye situation first.   2.  Coronary artery disease involving native coronary arteries: Symptoms are well controlled at the present time.  3.  Chronic systolic heart  failure: She appears to be euvolemic currently on optimal medical therapy.  4.  Hyperlipidemia: Currently on rosuvastatin  and Zetia .  Most recent lipid profile in January showed an LDL of 49.  5.  Carotid disease: This was mild on most recent carotid Doppler in 2023.  6.  Preop cardiovascular evaluation for left eye surgery: Should be a low risk from a cardiac standpoint.  Cilostazol  can be held 3 days before.   Disposition:   Follow-up with me in 3 months.  Signed,  Antionette Kirks, MD  12/26/2023 7:52 AM    [facility]

## 2023-12-25 NOTE — Patient Instructions (Signed)
 Medication Instructions:  No changes *If you need a refill on your cardiac medications before your next appointment, please call your pharmacy*  Lab Work: None ordered If you have labs (blood work) drawn today and your tests are completely normal, you will receive your results only by: MyChart Message (if you have MyChart) OR A paper copy in the mail If you have any lab test that is abnormal or we need to change your treatment, we will call you to review the results.  Testing/Procedures: None ordered  Follow-Up: At [facility], you and your health needs are our priority.  As part of our continuing mission to provide you with exceptional heart care, our providers are all part of one team.  This team includes your primary Cardiologist (physician) and Advanced Practice Providers or APPs (Physician Assistants and Nurse Practitioners) who all work together to provide you with the care you need, when you need it.  Your next appointment:   3 month(s)  Provider:   Dr. Alvenia Aus  We recommend signing up for the patient portal called "MyChart".  Sign up information is provided on this After Visit Summary.  MyChart is used to connect with patients for Virtual Visits (Telemedicine).  Patients are able to view lab/test results, encounter notes, upcoming appointments, etc.  Non-urgent messages can be sent to your provider as well.   To learn more about what you can do with MyChart, go to [email or web address].

## 2024-01-21 ENCOUNTER — Encounter: Payer: Self-pay | Admitting: Cardiovascular Disease

## 2024-01-21 ENCOUNTER — Telehealth: Payer: Self-pay | Admitting: Internal Medicine

## 2024-01-21 NOTE — Telephone Encounter (Signed)
   Patient Name: Kiara [name]  DOB: 18-Aug-1950 MRN: [id number]  Primary Cardiologist: Vishnu P Mallipeddi, MD  Chart reviewed as part of pre-operative protocol coverage.  Lens extractions are recognized in guidelines as low risk surgeries that do not typically require specific preoperative testing or holding of blood thinner therapy. Therefore, given past medical history and time since last visit, based on ACC/AHA guidelines, Kiara [name] would be at acceptable risk for the planned procedure without further cardiovascular testing.   Regarding ASA therapy, we recommend continuation of ASA throughout the perioperative period. However, if the surgeon feels that cessation of ASA is required in the perioperative period, it may be stopped 5-7 days prior to surgery with a plan to resume it as soon as felt to be feasible from a surgical standpoint in the post-operative period.    I will route this recommendation to the requesting party via Epic fax function and remove from pre-op pool.  Please call with questions.  Kiara CHRISTELLA Beauvais, NP 01/21/2024, 11:35 AM

## 2024-01-21 NOTE — Telephone Encounter (Signed)
 Error

## 2024-01-21 NOTE — Telephone Encounter (Signed)
   [facility] Pre-operative Risk Assessment    Request for surgical clearance:  What type of surgery is being performed?  Lens Fragment Removal of Eye   When is this surgery scheduled?  01/24/24  What type of clearance is required (medical clearance vs. Pharmacy clearance to hold med vs. Both)?  Medical clearance + 4/25 echo and last OV note needed due to diagnosis of CHF.   Are there any medications that need to be held prior to surgery and how long? No    Practice name and name of physician performing surgery?  Dr. Camellia [name]  Dr . Kiara [name]    What is your office phone number? [phone]    7.   What is your office fax number? [phone]  8.   Anesthesia type (None, local, MAC, general)?  A topical will be used--Kiara [name] unsure which one.   Kiara [name] 01/21/2024, 11:05 AM

## 2024-01-22 ENCOUNTER — Telehealth: Payer: Self-pay

## 2024-01-22 ENCOUNTER — Telehealth: Payer: Self-pay | Admitting: Cardiovascular Disease

## 2024-01-22 ENCOUNTER — Telehealth: Payer: Self-pay | Admitting: Internal Medicine

## 2024-01-22 NOTE — Telephone Encounter (Signed)
 I will re-fax clearance notes pre preop APP Josefa Beauvais, FNP.

## 2024-01-22 NOTE — Telephone Encounter (Signed)
   Pre-operative Risk Assessment    Patient Name: Kiara [name]  DOB: July 08, 1951 MRN: [id number]      Request for Surgical Clearance    Procedure:  Removal of Retained Lens Fragment  Date of Surgery:  Clearance 01/24/24                                 Surgeon:  Dr. Camellia Jama Endo MD Surgeon's Group or Practice Name:  [facility]  Phone number:  [phone] ext [phone] Fax number:  [phone]   Type of Clearance Requested:   - Medical    Type of Anesthesia:  Local  with light sedation   Additional requests/questions:  Please fax a copy of clearance  to the surgeon's office.  Signed, Darryle GORMAN Glance   01/22/2024, 11:58 AM

## 2024-01-22 NOTE — Telephone Encounter (Signed)
 Requesting a copy of patient last office visit notes. It can be fax [phone]. Please advise e

## 2024-01-22 NOTE — Telephone Encounter (Signed)
   Pre-operative Risk Assessment    Patient Name: Kiara [name]  DOB: [birth year] MRN: [id number]   Date of last office visit: 12/25/23 DEATRICE CAGE, MD Date of next office visit: 04/01/24 DEATRICE CAGE, MD   Request for Surgical Clearance    Procedure:  REMOVAL OF RETAINED LENS FRAGMENT  Date of Surgery:  Clearance 01/24/24   (URGENT)                             Surgeon:  DR ERIC LEE PETERSON Surgeon's Group or Practice Name:  [facility] Phone number:  [phone]  [phone] Fax number:  [phone]   Type of Clearance Requested:   - Medical  - Pharmacy:  Hold Aspirin  (PER CLEARANCE NO NEED TO STOP ANTICOAGULATION   Type of Anesthesia:  Local WITH LIGHT SEDATION   Additional requests/questions:    [name] [name]   01/22/2024, 9:38 AM

## 2024-01-22 NOTE — Telephone Encounter (Signed)
 Preoperative team,  This is a duplicate request.  Please resend yesterday's response to requesting office.  Thank you for your help.  Josefa HERO. Dayla Gasca NP-C     01/22/2024, 9:47 AM [facility] [address] Office [phone] Fax [phone]

## 2024-02-04 ENCOUNTER — Emergency Department (HOSPITAL_COMMUNITY)

## 2024-02-04 ENCOUNTER — Other Ambulatory Visit: Payer: Self-pay

## 2024-02-04 ENCOUNTER — Encounter (HOSPITAL_COMMUNITY): Payer: Self-pay

## 2024-02-04 ENCOUNTER — Inpatient Hospital Stay (HOSPITAL_COMMUNITY)
Admission: EM | Admit: 2024-02-04 | Discharge: 2024-02-15 | DRG: 252 | Disposition: A | Discharge status: Home Health | Attending: Family Medicine | Admitting: Family Medicine

## 2024-02-04 DIAGNOSIS — Z89421 Acquired absence of other right toe(s): Secondary | ICD-10-CM

## 2024-02-04 DIAGNOSIS — R0602 Shortness of breath: Secondary | ICD-10-CM | POA: Diagnosis not present

## 2024-02-04 DIAGNOSIS — E1165 Type 2 diabetes mellitus with hyperglycemia: Secondary | ICD-10-CM

## 2024-02-04 DIAGNOSIS — Z7982 Long term (current) use of aspirin: Secondary | ICD-10-CM

## 2024-02-04 DIAGNOSIS — E876 Hypokalemia: Secondary | ICD-10-CM | POA: Diagnosis present

## 2024-02-04 DIAGNOSIS — Z6833 Body mass index (BMI) 33.0-33.9, adult: Secondary | ICD-10-CM

## 2024-02-04 DIAGNOSIS — I130 Hypertensive heart and chronic kidney disease with heart failure and stage 1 through stage 4 chronic kidney disease, or unspecified chronic kidney disease: Secondary | ICD-10-CM | POA: Diagnosis present

## 2024-02-04 DIAGNOSIS — N179 Acute kidney failure, unspecified: Secondary | ICD-10-CM | POA: Diagnosis present

## 2024-02-04 DIAGNOSIS — Z886 Allergy status to analgesic agent status: Secondary | ICD-10-CM

## 2024-02-04 DIAGNOSIS — I2722 Pulmonary hypertension due to left heart disease: Secondary | ICD-10-CM | POA: Diagnosis present

## 2024-02-04 DIAGNOSIS — K219 Gastro-esophageal reflux disease without esophagitis: Secondary | ICD-10-CM | POA: Diagnosis present

## 2024-02-04 DIAGNOSIS — I10 Essential (primary) hypertension: Secondary | ICD-10-CM | POA: Diagnosis present

## 2024-02-04 DIAGNOSIS — I249 Acute ischemic heart disease, unspecified: Secondary | ICD-10-CM | POA: Diagnosis present

## 2024-02-04 DIAGNOSIS — I2510 Atherosclerotic heart disease of native coronary artery without angina pectoris: Secondary | ICD-10-CM | POA: Diagnosis present

## 2024-02-04 DIAGNOSIS — T380X5A Adverse effect of glucocorticoids and synthetic analogues, initial encounter: Secondary | ICD-10-CM | POA: Diagnosis present

## 2024-02-04 DIAGNOSIS — R7982 Elevated C-reactive protein (CRP): Secondary | ICD-10-CM | POA: Diagnosis present

## 2024-02-04 DIAGNOSIS — G4733 Obstructive sleep apnea (adult) (pediatric): Secondary | ICD-10-CM | POA: Diagnosis present

## 2024-02-04 DIAGNOSIS — E1051 Type 1 diabetes mellitus with diabetic peripheral angiopathy without gangrene: Secondary | ICD-10-CM | POA: Diagnosis present

## 2024-02-04 DIAGNOSIS — Z79899 Other long term (current) drug therapy: Secondary | ICD-10-CM

## 2024-02-04 DIAGNOSIS — E1065 Type 1 diabetes mellitus with hyperglycemia: Secondary | ICD-10-CM | POA: Diagnosis present

## 2024-02-04 DIAGNOSIS — H548 Legal blindness, as defined in USA: Secondary | ICD-10-CM | POA: Diagnosis present

## 2024-02-04 DIAGNOSIS — I214 Non-ST elevation (NSTEMI) myocardial infarction: Principal | ICD-10-CM | POA: Diagnosis present

## 2024-02-04 DIAGNOSIS — N1832 Chronic kidney disease, stage 3b: Secondary | ICD-10-CM | POA: Diagnosis present

## 2024-02-04 DIAGNOSIS — Z882 Allergy status to sulfonamides status: Secondary | ICD-10-CM

## 2024-02-04 DIAGNOSIS — I2721 Secondary pulmonary arterial hypertension: Secondary | ICD-10-CM | POA: Diagnosis present

## 2024-02-04 DIAGNOSIS — I5043 Acute on chronic combined systolic (congestive) and diastolic (congestive) heart failure: Secondary | ICD-10-CM | POA: Diagnosis present

## 2024-02-04 DIAGNOSIS — R042 Hemoptysis: Secondary | ICD-10-CM

## 2024-02-04 DIAGNOSIS — Z885 Allergy status to narcotic agent status: Secondary | ICD-10-CM

## 2024-02-04 DIAGNOSIS — J9601 Acute respiratory failure with hypoxia: Secondary | ICD-10-CM | POA: Diagnosis present

## 2024-02-04 DIAGNOSIS — E785 Hyperlipidemia, unspecified: Secondary | ICD-10-CM | POA: Diagnosis present

## 2024-02-04 DIAGNOSIS — E669 Obesity, unspecified: Secondary | ICD-10-CM | POA: Diagnosis present

## 2024-02-04 DIAGNOSIS — E871 Hypo-osmolality and hyponatremia: Secondary | ICD-10-CM | POA: Diagnosis not present

## 2024-02-04 DIAGNOSIS — R519 Headache, unspecified: Secondary | ICD-10-CM | POA: Diagnosis not present

## 2024-02-04 DIAGNOSIS — I255 Ischemic cardiomyopathy: Secondary | ICD-10-CM | POA: Diagnosis present

## 2024-02-04 DIAGNOSIS — E1042 Type 1 diabetes mellitus with diabetic polyneuropathy: Secondary | ICD-10-CM | POA: Diagnosis present

## 2024-02-04 DIAGNOSIS — Z881 Allergy status to other antibiotic agents status: Secondary | ICD-10-CM

## 2024-02-04 DIAGNOSIS — Z87891 Personal history of nicotine dependence: Secondary | ICD-10-CM

## 2024-02-04 DIAGNOSIS — Z7902 Long term (current) use of antithrombotics/antiplatelets: Secondary | ICD-10-CM

## 2024-02-04 DIAGNOSIS — F419 Anxiety disorder, unspecified: Secondary | ICD-10-CM | POA: Diagnosis present

## 2024-02-04 DIAGNOSIS — D72828 Other elevated white blood cell count: Secondary | ICD-10-CM | POA: Diagnosis present

## 2024-02-04 DIAGNOSIS — Z8249 Family history of ischemic heart disease and other diseases of the circulatory system: Secondary | ICD-10-CM

## 2024-02-04 DIAGNOSIS — E1022 Type 1 diabetes mellitus with diabetic chronic kidney disease: Secondary | ICD-10-CM | POA: Diagnosis present

## 2024-02-04 DIAGNOSIS — Z951 Presence of aortocoronary bypass graft: Secondary | ICD-10-CM

## 2024-02-04 DIAGNOSIS — D631 Anemia in chronic kidney disease: Secondary | ICD-10-CM | POA: Diagnosis present

## 2024-02-04 DIAGNOSIS — Z794 Long term (current) use of insulin: Secondary | ICD-10-CM

## 2024-02-04 DIAGNOSIS — I252 Old myocardial infarction: Secondary | ICD-10-CM

## 2024-02-04 LAB — CBC
HCT: 29.4 % — ABNORMAL LOW (ref 36.0–46.0)
Hemoglobin: 9.6 g/dL — ABNORMAL LOW (ref 12.0–15.0)
MCH: 30.3 pg (ref 26.0–34.0)
MCHC: 32.7 g/dL (ref 30.0–36.0)
MCV: 92.7 fL (ref 80.0–100.0)
Platelets: 205 K/uL (ref 150–400)
RBC: 3.17 MIL/uL — ABNORMAL LOW (ref 3.87–5.11)
RDW: 14.1 % (ref 11.5–15.5)
WBC: 11.4 K/uL — ABNORMAL HIGH (ref 4.0–10.5)
nRBC: 0 % (ref 0.0–0.2)

## 2024-02-04 LAB — BRAIN NATRIURETIC PEPTIDE: B Natriuretic Peptide: 1264 pg/mL — ABNORMAL HIGH (ref 0.0–100.0)

## 2024-02-04 LAB — BASIC METABOLIC PANEL WITH GFR
Anion gap: 14 (ref 5–15)
BUN: 26 mg/dL — ABNORMAL HIGH (ref 8–23)
CO2: 29 mmol/L (ref 22–32)
Calcium: 8.4 mg/dL — ABNORMAL LOW (ref 8.9–10.3)
Chloride: 92 mmol/L — ABNORMAL LOW (ref 98–111)
Creatinine, Ser: 1.65 mg/dL — ABNORMAL HIGH (ref 0.44–1.00)
GFR, Estimated: 33 mL/min — ABNORMAL LOW (ref >60)
Glucose, Bld: 392 mg/dL — ABNORMAL HIGH (ref 70–99)
Potassium: 3.2 mmol/L — ABNORMAL LOW (ref 3.5–5.1)
Sodium: 135 mmol/L (ref 135–145)

## 2024-02-04 LAB — TROPONIN I (HIGH SENSITIVITY): Troponin I (High Sensitivity): 2778 ng/L (ref <18)

## 2024-02-04 LAB — CBG MONITORING, ED: Glucose-Capillary: 404 mg/dL — ABNORMAL HIGH (ref 70–99)

## 2024-02-04 LAB — LIPASE, BLOOD: Lipase: 22 U/L (ref 11–51)

## 2024-02-04 MED ORDER — ASPIRIN 81 MG PO CHEW
324.0000 mg | CHEWABLE_TABLET | Freq: Once | ORAL | Status: AC
  Administered 2024-02-04: 324 mg via ORAL
  Filled 2024-02-04: qty 4

## 2024-02-04 MED ORDER — POTASSIUM CHLORIDE CRYS ER 20 MEQ PO TBCR
40.0000 meq | EXTENDED_RELEASE_TABLET | Freq: Once | ORAL | Status: AC
Start: 2024-02-04 — End: 2024-02-04
  Administered 2024-02-04: 40 meq via ORAL
  Filled 2024-02-04: qty 2

## 2024-02-04 MED ORDER — IOHEXOL 350 MG/ML SOLN
75.0000 mL | Freq: Once | INTRAVENOUS | Status: AC | PRN
  Administered 2024-02-04: 75 mL via INTRAVENOUS

## 2024-02-04 MED ORDER — FUROSEMIDE 10 MG/ML IJ SOLN
40.0000 mg | Freq: Once | INTRAMUSCULAR | Status: AC
  Administered 2024-02-04: 40 mg via INTRAVENOUS
  Filled 2024-02-04: qty 4

## 2024-02-04 MED ORDER — FENTANYL CITRATE PF 50 MCG/ML IJ SOSY
50.0000 ug | PREFILLED_SYRINGE | Freq: Once | INTRAMUSCULAR | Status: AC
  Administered 2024-02-04: 50 ug via INTRAVENOUS
  Filled 2024-02-04: qty 1

## 2024-02-04 NOTE — ED Provider Notes (Incomplete)
 [facility] EMERGENCY DEPARTMENT AT [facility] Provider Note   CSN: [id number] Arrival date & time: 02/04/24  1910     Patient presents with: Shortness of Breath   Kiara [name] is a 73 y.o. female.  {Add pertinent medical, surgical, social history, OB history to HPI:32947}  Shortness of Breath      Prior to Admission medications   Medication Sig Start Date End Date Taking? Authorizing Provider  acetaminophen  (TYLENOL ) 650 MG CR tablet Take 1,300 mg by mouth every 8 (eight) hours as needed for pain.    [provider]  albuterol  (VENTOLIN  HFA) 108 (90 Base) MCG/ACT inhaler Inhale 2 puffs into the lungs every 4 (four) hours as needed for shortness of breath or wheezing. 04/16/13   [provider]  aspirin  EC 81 MG tablet Take 81 mg by mouth daily.     [provider]  Cholecalciferol (VITAMIN D ) 2000 units tablet Take 2,000 Units by mouth daily.    [provider]  cilostazol  (PLETAL ) 100 MG tablet Take 1 tablet (100 mg total) by mouth 2 (two) times daily. 12/10/23   Mallipeddi, Vishnu P, MD  Cinnamon 500 MG capsule Take 500 mg by mouth daily.    [provider]  Coenzyme Q10 (COQ10) 100 MG CAPS Take 100 mg by mouth daily.     [provider]  COLLAGEN PO Take 1,000 mg by mouth daily.    [provider]  DULoxetine  (CYMBALTA ) 60 MG capsule Take 60 mg by mouth daily. 04/09/23   [provider]  ezetimibe  (ZETIA ) 10 MG tablet Take 1 tablet (10 mg total) by mouth daily. 12/10/23   Mallipeddi, Vishnu P, MD  fluticasone  (FLONASE ) 50 MCG/ACT nasal spray Place 2 sprays into both nostrils at bedtime as needed for allergies or rhinitis.    [provider]  furosemide  (LASIX ) 40 MG tablet Take 1 tablet (40 mg total) by mouth 2 (two) times daily. 09/17/23   Meng, Hao, PA  gabapentin  (NEURONTIN ) 800 MG tablet Take 800 mg by mouth 3 (three) times daily.  07/29/13   [provider]  insulin  aspart  (NOVOLOG ) 100 UNIT/ML injection Inject into the skin See admin instructions. 1.25 Units 12 Am-7 Am,  1.75 u/hr 7 AM  Til 12 AM, Bolus 1 unit for every 15 carbs, 1 Unit for every 50 points greater than 150 Insulin  pump 11/12/13   [provider]  levocetirizine (XYZAL ) 5 MG tablet Take 5 mg by mouth daily.  07/21/13   [provider]  metoprolol  succinate (TOPROL -XL) 25 MG 24 hr tablet TAKE 1 TABLET TWICE DAILY 11/06/23   Mallipeddi, Vishnu P, MD  montelukast (SINGULAIR) 10 MG tablet Take 10 mg by mouth daily. 10/30/22   [provider]  neomycin-polymyxin b-dexamethasone  (MAXITROL) 3.5-10000-0.1 OINT Place 1 Application into both eyes at bedtime.    [provider]  omeprazole (PRILOSEC) 40 MG capsule Take 40 mg by mouth daily. 01/23/19   [provider]  rosuvastatin  (CRESTOR ) 40 MG tablet TAKE ONE TABLET BY MOUTH ONE TIME A DAY. STOP LIPITOR  03/12/23   Lavona Agent, MD  Turmeric 500 MG TABS Take 1 tablet by mouth daily.    [provider]    Allergies: Bactrim [sulfamethoxazole-trimethoprim], Codeine, Mobic [meloxicam], Morphine  and codeine, Silvadene [silver sulfadiazine], and Adhesive [tape]    Review of Systems  Respiratory:  Positive for shortness of breath.     Updated Vital Signs BP (!) 117/49 (BP Location: Right Arm)  Pulse (!) 103   Temp 99.2 F (37.3 C) (Oral)   Resp 20   Ht 5' 3 (1.6 m)   Wt 90.7 kg   SpO2 93%   BMI 35.43 kg/m   Physical Exam  (all labs ordered are listed, but only abnormal results are displayed) Labs Reviewed  CBG MONITORING, ED - Abnormal; Notable for the following components:      Result Value   Glucose-Capillary 404 (*)    All other components within normal limits  BASIC METABOLIC PANEL WITH GFR  CBC  BRAIN NATRIURETIC PEPTIDE    EKG: None  Radiology: DG Chest 2 View Result Date: 02/04/2024 CLINICAL DATA:  Shortness of breath EXAM: CHEST - 2 VIEW COMPARISON:  Chest radiograph August 04, 2017 FINDINGS: The heart size and mediastinal contours are within it borderline normal limits. Blunting of bilateral costophrenic angles left greater than right suggestive of mild pleural effusion/atelectasis, improved to prior exam from 2019. No suspicious new consolidation or pulmonary nodule. Status post median sternotomy. The visualized skeletal structures are unremarkable. IMPRESSION: Suggestion of bilateral small pleural effusions. Otherwise no active cardiopulmonary disease. Status post CABG. Electronically Signed   By: Megan  Zare M.D.   On: 02/04/2024 20:01    {Document cardiac monitor, telemetry assessment procedure when appropriate:32947} Procedures   Medications Ordered in the ED - No data to display  Clinical Course as of 02/04/24 2354  Mon Feb 04, 2024  2342 With on-call cardiology fellow , patient here with chest and back pain, some shortness of breath and developed vomitus in the ER x 1 but did have frank blood that she coughed up here.  His recommendation was workup for mopped assist before heparinization, trend troponin and hemoglobin, admit to [facility] and keep n.p.o.  Patient is going to order dissection study because she is having some back pain as well, but after discussion with CT tech, hemoptysis protocol will be PE study, given that she had hemoptysis, shortness of breath, has elevated BNP and Trope I am more concerned about PE than dissection, she additionally she states the back pain has been present for couple of months, is just worse today. [CB]    Clinical Course User Index [CB] Suellen Cantor A, PA-C   {Click here for ABCD2, HEART and other calculators REFRESH Note before signing:1}                              Medical Decision Making This patient presents to the ED for concern of ***, this involves an extensive number of treatment options, and is a complaint that carries with it a high risk of complications and morbidity.  The differential diagnosis includes  ***   Co morbidities that complicate the patient evaluation :   ***   Additional history obtained:  Additional history obtained from *** External records from outside source obtained and reviewed including ***   Lab Tests:  I Ordered, and personally interpreted labs.  The pertinent results include:  ***    Imaging Studies ordered:  I ordered imaging studies including *** which shows *** I independently visualized and interpreted imaging within scope of identifying emergent findings  I agree with the radiologist interpretation   Cardiac Monitoring: / EKG:  The patient was maintained on a cardiac monitor.  I personally viewed and interpreted the cardiac monitored which showed an underlying rhythm of: ***   Consultations Obtained:  I requested consultation with the cardiology fellow, Dr.  Floretta,  and discussed lab and imaging findings as well as pertinent plan - they recommend: Trend troponin, trend hemoglobin, workup hemoptysis, if no more hemoptysis, and troponins negative and hemoptysis workup is negative can start heparin .  Patient admitted to [facility] and kept n.p.o. in case of cardiac cath.   Problem List / ED Course / Critical interventions / Medication management  *** I ordered medication including fentanyl   for pain  Reevaluation of the patient after these medicines showed that the patient improved I have reviewed the patients home medicines and have made adjustments as needed   Social Determinants of Health:  ***   Test / Admission - Considered:  ***    Amount and/or Complexity of Data Reviewed Labs: ordered. Radiology: ordered.  Risk OTC drugs. Prescription drug management.   ***  {Document critical care time when appropriate  Document review of labs and clinical decision tools ie CHADS2VASC2, etc  Document your independent review of radiology images and any outside records  Document your discussion with family members, caretakers and  with consultants  Document social determinants of health affecting pt's care  Document your decision making why or why not admission, treatments were needed:32947:::1}   Final diagnoses:  None    ED Discharge Orders     None

## 2024-02-04 NOTE — ED Notes (Signed)
 Pt coughed up bloody sputum and get choked on salviaGLENWOOD Musca, PA and Devere, RN made aware.

## 2024-02-04 NOTE — ED Notes (Signed)
 Pt assisted to bsc and back to bed. Pt on cardiac monitor. Denies pain at this time. Call light within reach, side rails upx2, bed locked and in lowest position. Son at bedside.

## 2024-02-04 NOTE — ED Triage Notes (Signed)
 Pt c/c of SOB along side back pain in her shoulder blades, and intermittent chest pain. SOB started yesterday evening. Hx of MI. Pain rated 8/10.

## 2024-02-04 NOTE — ED Notes (Signed)
 Pt had episode of urge incontinence. Bedding changed and soiled clothing removed. Dry brief applied.

## 2024-02-04 NOTE — ED Provider Notes (Signed)
 [place] EMERGENCY DEPARTMENT AT [facility] Provider Note   CSN: [id number] Arrival date & time: 02/04/24  1910     Patient presents with: Shortness of Breath   Kiara [name] is a 73 y.o. female.  {Add pertinent medical, surgical, social history, OB history to HPI:32947}  Shortness of Breath      Prior to Admission medications   Medication Sig Start Date End Date Taking? Authorizing Provider  acetaminophen  (TYLENOL ) 650 MG CR tablet Take 1,300 mg by mouth every 8 (eight) hours as needed for pain.    [provider]  albuterol  (VENTOLIN  HFA) 108 (90 Base) MCG/ACT inhaler Inhale 2 puffs into the lungs every 4 (four) hours as needed for shortness of breath or wheezing. 04/16/13   [provider]  aspirin  EC 81 MG tablet Take 81 mg by mouth daily.     [provider]  Cholecalciferol (VITAMIN D ) 2000 units tablet Take 2,000 Units by mouth daily.    [provider]  cilostazol  (PLETAL ) 100 MG tablet Take 1 tablet (100 mg total) by mouth 2 (two) times daily. 12/10/23   Mallipeddi, Vishnu P, MD  Cinnamon 500 MG capsule Take 500 mg by mouth daily.    [provider]  Coenzyme Q10 (COQ10) 100 MG CAPS Take 100 mg by mouth daily.     [provider]  COLLAGEN PO Take 1,000 mg by mouth daily.    [provider]  DULoxetine  (CYMBALTA ) 60 MG capsule Take 60 mg by mouth daily. 04/09/23   [provider]  ezetimibe  (ZETIA ) 10 MG tablet Take 1 tablet (10 mg total) by mouth daily. 12/10/23   Mallipeddi, Vishnu P, MD  fluticasone  (FLONASE ) 50 MCG/ACT nasal spray Place 2 sprays into both nostrils at bedtime as needed for allergies or rhinitis.    [provider]  furosemide  (LASIX ) 40 MG tablet Take 1 tablet (40 mg total) by mouth 2 (two) times daily. 09/17/23   Meng, Hao, PA  gabapentin  (NEURONTIN ) 800 MG tablet Take 800 mg by mouth 3 (three) times daily.  07/29/13   [provider]  insulin  aspart  (NOVOLOG ) 100 UNIT/ML injection Inject into the skin See admin instructions. 1.25 Units 12 Am-7 Am,  1.75 u/hr 7 AM  Til 12 AM, Bolus 1 unit for every 15 carbs, 1 Unit for every 50 points greater than 150 Insulin  pump 11/12/13   [provider]  levocetirizine (XYZAL ) 5 MG tablet Take 5 mg by mouth daily.  07/21/13   [provider]  metoprolol  succinate (TOPROL -XL) 25 MG 24 hr tablet TAKE 1 TABLET TWICE DAILY 11/06/23   Mallipeddi, Vishnu P, MD  montelukast (SINGULAIR) 10 MG tablet Take 10 mg by mouth daily. 10/30/22   [provider]  neomycin-polymyxin b-dexamethasone  (MAXITROL) 3.5-10000-0.1 OINT Place 1 Application into both eyes at bedtime.    [provider]  omeprazole (PRILOSEC) 40 MG capsule Take 40 mg by mouth daily. 01/23/19   [provider]  rosuvastatin  (CRESTOR ) 40 MG tablet TAKE ONE TABLET BY MOUTH ONE TIME A DAY. STOP LIPITOR  03/12/23   Lavona Agent, MD  Turmeric 500 MG TABS Take 1 tablet by mouth daily.    [provider]    Allergies: Bactrim [sulfamethoxazole-trimethoprim], Codeine, Mobic [meloxicam], Morphine  and codeine, Silvadene [silver sulfadiazine], and Adhesive [tape]    Review of Systems  Respiratory:  Positive for shortness of breath.     Updated Vital Signs BP (!) 117/49 (BP Location: Right Arm)  Pulse (!) 103   Temp 99.2 F (37.3 C) (Oral)   Resp 20   Ht 5' 3 (1.6 m)   Wt 90.7 kg   SpO2 93%   BMI 35.43 kg/m   Physical Exam  (all labs ordered are listed, but only abnormal results are displayed) Labs Reviewed  CBG MONITORING, ED - Abnormal; Notable for the following components:      Result Value   Glucose-Capillary 404 (*)    All other components within normal limits  BASIC METABOLIC PANEL WITH GFR  CBC  BRAIN NATRIURETIC PEPTIDE    EKG: None  Radiology: DG Chest 2 View Result Date: 02/04/2024 CLINICAL DATA:  Shortness of breath EXAM: CHEST - 2 VIEW COMPARISON:  Chest radiograph August 04, 2017 FINDINGS: The heart size and mediastinal contours are within it borderline normal limits. Blunting of bilateral costophrenic angles left greater than right suggestive of mild pleural effusion/atelectasis, improved to prior exam from 2019. No suspicious new consolidation or pulmonary nodule. Status post median sternotomy. The visualized skeletal structures are unremarkable. IMPRESSION: Suggestion of bilateral small pleural effusions. Otherwise no active cardiopulmonary disease. Status post CABG. Electronically Signed   By: Megan  Zare M.D.   On: 02/04/2024 20:01    {Document cardiac monitor, telemetry assessment procedure when appropriate:32947} Procedures   Medications Ordered in the ED - No data to display    {Click here for ABCD2, HEART and other calculators REFRESH Note before signing:1}                              Medical Decision Making Amount and/or Complexity of Data Reviewed Labs: ordered. Radiology: ordered.   ***  {Document critical care time when appropriate  Document review of labs and clinical decision tools ie [id number], etc  Document your independent review of radiology images and any outside records  Document your discussion with family members, caretakers and with consultants  Document social determinants of health affecting pt's care  Document your decision making why or why not admission, treatments were needed:32947:::1}   Final diagnoses:  None    ED Discharge Orders     None

## 2024-02-05 ENCOUNTER — Inpatient Hospital Stay (HOSPITAL_COMMUNITY)

## 2024-02-05 ENCOUNTER — Encounter (HOSPITAL_COMMUNITY): Payer: Self-pay

## 2024-02-05 DIAGNOSIS — D631 Anemia in chronic kidney disease: Secondary | ICD-10-CM | POA: Diagnosis present

## 2024-02-05 DIAGNOSIS — I5040 Unspecified combined systolic (congestive) and diastolic (congestive) heart failure: Secondary | ICD-10-CM | POA: Diagnosis not present

## 2024-02-05 DIAGNOSIS — G4733 Obstructive sleep apnea (adult) (pediatric): Secondary | ICD-10-CM | POA: Diagnosis present

## 2024-02-05 DIAGNOSIS — R042 Hemoptysis: Secondary | ICD-10-CM | POA: Diagnosis present

## 2024-02-05 DIAGNOSIS — E669 Obesity, unspecified: Secondary | ICD-10-CM | POA: Diagnosis present

## 2024-02-05 DIAGNOSIS — J9601 Acute respiratory failure with hypoxia: Secondary | ICD-10-CM | POA: Diagnosis present

## 2024-02-05 DIAGNOSIS — I2510 Atherosclerotic heart disease of native coronary artery without angina pectoris: Secondary | ICD-10-CM | POA: Diagnosis present

## 2024-02-05 DIAGNOSIS — Z794 Long term (current) use of insulin: Secondary | ICD-10-CM | POA: Diagnosis not present

## 2024-02-05 DIAGNOSIS — E785 Hyperlipidemia, unspecified: Secondary | ICD-10-CM | POA: Diagnosis present

## 2024-02-05 DIAGNOSIS — I361 Nonrheumatic tricuspid (valve) insufficiency: Secondary | ICD-10-CM | POA: Diagnosis not present

## 2024-02-05 DIAGNOSIS — Z6833 Body mass index (BMI) 33.0-33.9, adult: Secondary | ICD-10-CM | POA: Diagnosis not present

## 2024-02-05 DIAGNOSIS — R61 Generalized hyperhidrosis: Secondary | ICD-10-CM | POA: Diagnosis not present

## 2024-02-05 DIAGNOSIS — I5043 Acute on chronic combined systolic (congestive) and diastolic (congestive) heart failure: Secondary | ICD-10-CM | POA: Diagnosis present

## 2024-02-05 DIAGNOSIS — R519 Headache, unspecified: Secondary | ICD-10-CM | POA: Diagnosis not present

## 2024-02-05 DIAGNOSIS — E871 Hypo-osmolality and hyponatremia: Secondary | ICD-10-CM | POA: Diagnosis not present

## 2024-02-05 DIAGNOSIS — I959 Hypotension, unspecified: Secondary | ICD-10-CM | POA: Diagnosis not present

## 2024-02-05 DIAGNOSIS — E1065 Type 1 diabetes mellitus with hyperglycemia: Secondary | ICD-10-CM | POA: Diagnosis present

## 2024-02-05 DIAGNOSIS — N179 Acute kidney failure, unspecified: Secondary | ICD-10-CM | POA: Diagnosis present

## 2024-02-05 DIAGNOSIS — E1042 Type 1 diabetes mellitus with diabetic polyneuropathy: Secondary | ICD-10-CM | POA: Diagnosis present

## 2024-02-05 DIAGNOSIS — I2721 Secondary pulmonary arterial hypertension: Secondary | ICD-10-CM | POA: Diagnosis present

## 2024-02-05 DIAGNOSIS — I5023 Acute on chronic systolic (congestive) heart failure: Secondary | ICD-10-CM

## 2024-02-05 DIAGNOSIS — I371 Nonrheumatic pulmonary valve insufficiency: Secondary | ICD-10-CM | POA: Diagnosis not present

## 2024-02-05 DIAGNOSIS — I10 Essential (primary) hypertension: Secondary | ICD-10-CM | POA: Diagnosis not present

## 2024-02-05 DIAGNOSIS — F419 Anxiety disorder, unspecified: Secondary | ICD-10-CM | POA: Diagnosis present

## 2024-02-05 DIAGNOSIS — Z743 Need for continuous supervision: Secondary | ICD-10-CM | POA: Diagnosis not present

## 2024-02-05 DIAGNOSIS — I255 Ischemic cardiomyopathy: Secondary | ICD-10-CM | POA: Diagnosis present

## 2024-02-05 DIAGNOSIS — R7989 Other specified abnormal findings of blood chemistry: Secondary | ICD-10-CM | POA: Diagnosis not present

## 2024-02-05 DIAGNOSIS — I214 Non-ST elevation (NSTEMI) myocardial infarction: Secondary | ICD-10-CM | POA: Diagnosis present

## 2024-02-05 DIAGNOSIS — I130 Hypertensive heart and chronic kidney disease with heart failure and stage 1 through stage 4 chronic kidney disease, or unspecified chronic kidney disease: Secondary | ICD-10-CM | POA: Diagnosis present

## 2024-02-05 DIAGNOSIS — I249 Acute ischemic heart disease, unspecified: Secondary | ICD-10-CM | POA: Diagnosis present

## 2024-02-05 DIAGNOSIS — I340 Nonrheumatic mitral (valve) insufficiency: Secondary | ICD-10-CM | POA: Diagnosis not present

## 2024-02-05 DIAGNOSIS — M316 Other giant cell arteritis: Secondary | ICD-10-CM | POA: Diagnosis not present

## 2024-02-05 DIAGNOSIS — K219 Gastro-esophageal reflux disease without esophagitis: Secondary | ICD-10-CM | POA: Diagnosis present

## 2024-02-05 DIAGNOSIS — E1022 Type 1 diabetes mellitus with diabetic chronic kidney disease: Secondary | ICD-10-CM | POA: Diagnosis present

## 2024-02-05 DIAGNOSIS — E1051 Type 1 diabetes mellitus with diabetic peripheral angiopathy without gangrene: Secondary | ICD-10-CM | POA: Diagnosis present

## 2024-02-05 DIAGNOSIS — N1832 Chronic kidney disease, stage 3b: Secondary | ICD-10-CM | POA: Diagnosis present

## 2024-02-05 DIAGNOSIS — E876 Hypokalemia: Secondary | ICD-10-CM | POA: Diagnosis present

## 2024-02-05 DIAGNOSIS — I2722 Pulmonary hypertension due to left heart disease: Secondary | ICD-10-CM | POA: Diagnosis present

## 2024-02-05 DIAGNOSIS — Z87891 Personal history of nicotine dependence: Secondary | ICD-10-CM | POA: Diagnosis not present

## 2024-02-05 DIAGNOSIS — E1122 Type 2 diabetes mellitus with diabetic chronic kidney disease: Secondary | ICD-10-CM | POA: Diagnosis not present

## 2024-02-05 DIAGNOSIS — R0602 Shortness of breath: Secondary | ICD-10-CM | POA: Diagnosis present

## 2024-02-05 LAB — BASIC METABOLIC PANEL WITH GFR
Anion gap: 11 (ref 5–15)
BUN: 30 mg/dL — ABNORMAL HIGH (ref 8–23)
CO2: 28 mmol/L (ref 22–32)
Calcium: 8.5 mg/dL — ABNORMAL LOW (ref 8.9–10.3)
Chloride: 97 mmol/L — ABNORMAL LOW (ref 98–111)
Creatinine, Ser: 1.95 mg/dL — ABNORMAL HIGH (ref 0.44–1.00)
GFR, Estimated: 27 mL/min — ABNORMAL LOW (ref >60)
Glucose, Bld: 285 mg/dL — ABNORMAL HIGH (ref 70–99)
Potassium: 3.9 mmol/L (ref 3.5–5.1)
Sodium: 136 mmol/L (ref 135–145)

## 2024-02-05 LAB — ECHOCARDIOGRAM COMPLETE
Area-P 1/2: 3.89 cm2
Calc EF: 28.4 %
Est EF: 30
Height: 63 in
MV VTI: 0.93 cm2
S' Lateral: 2.8 cm
Single Plane A2C EF: 26.4 %
Single Plane A4C EF: 27.5 %
Weight: 3200 [oz_av]

## 2024-02-05 LAB — CBG MONITORING, ED
Glucose-Capillary: 178 mg/dL — ABNORMAL HIGH (ref 70–99)
Glucose-Capillary: 208 mg/dL — ABNORMAL HIGH (ref 70–99)
Glucose-Capillary: 249 mg/dL — ABNORMAL HIGH (ref 70–99)
Glucose-Capillary: 283 mg/dL — ABNORMAL HIGH (ref 70–99)
Glucose-Capillary: 361 mg/dL — ABNORMAL HIGH (ref 70–99)

## 2024-02-05 LAB — HEPARIN LEVEL (UNFRACTIONATED)
Heparin Unfractionated: 0.19 [IU]/mL — ABNORMAL LOW (ref 0.30–0.70)
Heparin Unfractionated: 0.17 [IU]/mL — ABNORMAL LOW (ref 0.30–0.70)

## 2024-02-05 LAB — CBC
HCT: 30.1 % — ABNORMAL LOW (ref 36.0–46.0)
Hemoglobin: 9.9 g/dL — ABNORMAL LOW (ref 12.0–15.0)
MCH: 30.4 pg (ref 26.0–34.0)
MCHC: 32.9 g/dL (ref 30.0–36.0)
MCV: 92.3 fL (ref 80.0–100.0)
Platelets: 189 K/uL (ref 150–400)
RBC: 3.26 MIL/uL — ABNORMAL LOW (ref 3.87–5.11)
RDW: 14.7 % (ref 11.5–15.5)
WBC: 15.9 K/uL — ABNORMAL HIGH (ref 4.0–10.5)
nRBC: 0 % (ref 0.0–0.2)

## 2024-02-05 LAB — APTT: aPTT: 70 s — ABNORMAL HIGH (ref 24–36)

## 2024-02-05 LAB — HIV ANTIBODY (ROUTINE TESTING W REFLEX): HIV Screen 4th Generation wRfx: NONREACTIVE

## 2024-02-05 LAB — TROPONIN I (HIGH SENSITIVITY): Troponin I (High Sensitivity): 2969 ng/L (ref <18)

## 2024-02-05 MED ORDER — PREDNISOLONE ACETATE 1 % OP SUSP: 1.0000 [drp] | Freq: Four times a day (QID) | OPHTHALMIC | Status: DC

## 2024-02-05 MED ORDER — GABAPENTIN 400 MG PO CAPS
800.0000 mg | ORAL_CAPSULE | Freq: Three times a day (TID) | ORAL | Status: DC
  Administered 2024-02-05 – 2024-02-07 (×7): 800 mg via ORAL
  Filled 2024-02-05 (×7): qty 2

## 2024-02-05 MED ORDER — METOPROLOL SUCCINATE ER 50 MG PO TB24
50.0000 mg | ORAL_TABLET | Freq: Every day | ORAL | Status: DC
  Administered 2024-02-05 – 2024-02-07 (×3): 50 mg via ORAL
  Filled 2024-02-05: qty 2
  Filled 2024-02-05 (×2): qty 1

## 2024-02-05 MED ORDER — HEPARIN (PORCINE) 25000 UT/250ML-% IV SOLN
1300.0000 [IU]/h | INTRAVENOUS | Status: DC
  Administered 2024-02-05: 900 [IU]/h via INTRAVENOUS
  Administered 2024-02-06: 1300 [IU]/h via INTRAVENOUS
  Administered 2024-02-06: 1200 [IU]/h via INTRAVENOUS
  Administered 2024-02-07: 1300 [IU]/h via INTRAVENOUS
  Filled 2024-02-05 (×3): qty 250

## 2024-02-05 MED ORDER — PANTOPRAZOLE SODIUM 40 MG PO TBEC
40.0000 mg | DELAYED_RELEASE_TABLET | Freq: Every day | ORAL | Status: DC
  Administered 2024-02-05 – 2024-02-15 (×10): 40 mg via ORAL
  Filled 2024-02-05 (×10): qty 1

## 2024-02-05 MED ORDER — LORATADINE 10 MG PO TABS
10.0000 mg | ORAL_TABLET | Freq: Every day | ORAL | Status: DC
  Administered 2024-02-05 – 2024-02-15 (×10): 10 mg via ORAL
  Filled 2024-02-05 (×10): qty 1

## 2024-02-05 MED ORDER — EZETIMIBE 10 MG PO TABS
10.0000 mg | ORAL_TABLET | Freq: Every day | ORAL | Status: DC
  Administered 2024-02-05 – 2024-02-15 (×10): 10 mg via ORAL
  Filled 2024-02-05 (×11): qty 1

## 2024-02-05 MED ORDER — ASPIRIN 81 MG PO TBEC
81.0000 mg | DELAYED_RELEASE_TABLET | Freq: Every day | ORAL | Status: DC
  Administered 2024-02-06 – 2024-02-15 (×10): 81 mg via ORAL
  Filled 2024-02-05 (×10): qty 1

## 2024-02-05 MED ORDER — INSULIN GLARGINE-YFGN 100 UNIT/ML ~~LOC~~ SOLN
15.0000 [IU] | Freq: Two times a day (BID) | SUBCUTANEOUS | Status: DC
  Administered 2024-02-05 – 2024-02-06 (×2): 15 [IU] via SUBCUTANEOUS
  Filled 2024-02-05 (×3): qty 0.15

## 2024-02-05 MED ORDER — DULOXETINE HCL 60 MG PO CPEP
60.0000 mg | ORAL_CAPSULE | Freq: Every day | ORAL | Status: DC
  Administered 2024-02-05 – 2024-02-15 (×10): 60 mg via ORAL
  Filled 2024-02-05 (×10): qty 1

## 2024-02-05 MED ORDER — INSULIN ASPART 100 UNIT/ML IJ SOLN
0.0000 [IU] | Freq: Three times a day (TID) | INTRAMUSCULAR | Status: DC
  Administered 2024-02-05: 2 [IU] via SUBCUTANEOUS
  Administered 2024-02-05 (×2): 5 [IU] via SUBCUTANEOUS
  Administered 2024-02-06 (×3): 15 [IU] via SUBCUTANEOUS
  Administered 2024-02-07: 11 [IU] via SUBCUTANEOUS
  Administered 2024-02-07: 5 [IU] via SUBCUTANEOUS
  Administered 2024-02-08: 2 [IU] via SUBCUTANEOUS
  Administered 2024-02-09: 5 [IU] via SUBCUTANEOUS
  Administered 2024-02-09: 11 [IU] via SUBCUTANEOUS
  Administered 2024-02-10: 8 [IU] via SUBCUTANEOUS
  Administered 2024-02-10 – 2024-02-11 (×2): 5 [IU] via SUBCUTANEOUS
  Administered 2024-02-11: 2 [IU] via SUBCUTANEOUS
  Administered 2024-02-11: 5 [IU] via SUBCUTANEOUS
  Administered 2024-02-12: 8 [IU] via SUBCUTANEOUS

## 2024-02-05 MED ORDER — IPRATROPIUM-ALBUTEROL 0.5-2.5 (3) MG/3ML IN SOLN: 3.0000 mL | Freq: Four times a day (QID) | RESPIRATORY_TRACT | Status: DC | PRN

## 2024-02-05 MED ORDER — FENTANYL CITRATE PF 50 MCG/ML IJ SOSY
50.0000 ug | PREFILLED_SYRINGE | Freq: Once | INTRAMUSCULAR | Status: AC
  Administered 2024-02-05: 50 ug via INTRAVENOUS
  Filled 2024-02-05: qty 1

## 2024-02-05 MED ORDER — SODIUM CHLORIDE 0.9 % IV SOLN: INTRAVENOUS | Status: DC

## 2024-02-05 MED ORDER — OFLOXACIN 0.3 % OP SOLN: 1.0000 [drp] | Freq: Four times a day (QID) | OPHTHALMIC | Status: DC

## 2024-02-05 MED ORDER — ERYTHROMYCIN 5 MG/GM OP OINT: 1.0000 | TOPICAL_OINTMENT | Freq: Every day | OPHTHALMIC | Status: DC

## 2024-02-05 MED ORDER — ROSUVASTATIN CALCIUM 20 MG PO TABS
40.0000 mg | ORAL_TABLET | Freq: Every day | ORAL | Status: DC
  Administered 2024-02-05 – 2024-02-07 (×3): 40 mg via ORAL
  Filled 2024-02-05 (×3): qty 2

## 2024-02-05 MED ORDER — FUROSEMIDE 10 MG/ML IJ SOLN
40.0000 mg | Freq: Two times a day (BID) | INTRAMUSCULAR | Status: DC
  Administered 2024-02-05 (×2): 40 mg via INTRAVENOUS
  Filled 2024-02-05 (×2): qty 4

## 2024-02-05 MED ORDER — HYDROMORPHONE HCL 1 MG/ML IJ SOLN
1.0000 mg | Freq: Once | INTRAMUSCULAR | Status: AC
  Administered 2024-02-05: 1 mg via INTRAVENOUS
  Filled 2024-02-05: qty 1

## 2024-02-05 MED ORDER — PERFLUTREN LIPID MICROSPHERE
1.0000 mL | INTRAVENOUS | Status: AC | PRN
  Administered 2024-02-05: 1 mL via INTRAVENOUS

## 2024-02-05 MED ORDER — ONDANSETRON HCL 4 MG/2ML IJ SOLN
4.0000 mg | Freq: Once | INTRAMUSCULAR | Status: AC
  Administered 2024-02-05: 4 mg via INTRAVENOUS
  Filled 2024-02-05: qty 2

## 2024-02-05 MED ORDER — CILOSTAZOL 100 MG PO TABS
100.0000 mg | ORAL_TABLET | Freq: Two times a day (BID) | ORAL | Status: DC
  Administered 2024-02-05 – 2024-02-06 (×2): 100 mg via ORAL
  Filled 2024-02-05 (×4): qty 1

## 2024-02-05 NOTE — Progress Notes (Signed)
 ANTICOAGULATION CONSULT NOTE  Pharmacy Consult for Heparin  Indication: chest pain/ACS  Allergies  Allergen Reactions   Bactrim [Sulfamethoxazole-Trimethoprim] Rash   Codeine Nausea And Vomiting   Silvadene [Silver Sulfadiazine] Other (See Comments)    Red hot rash and blisters   Adhesive [Tape] Rash    Band aid brand     Mobic [Meloxicam] Swelling   Morphine  And Codeine Nausea Only and Other (See Comments)    Confusion     Patient Measurements: Height: 5' 3 (160 cm) Weight: 90.7 kg (200 lb) IBW/kg (Calculated) : 52.4 Heparin  Dosing Weight: 73.1 kg  Vital Signs: Temp: 99.3 F (37.4 C) (07/08 0330) Temp Source: Oral (07/08 0328) BP: 119/62 (07/08 0827) Pulse Rate: 88 (07/08 0827)  Labs: Recent Labs    02/04/24 1959 02/04/24 2318 02/05/24 1042 02/05/24 1130  HGB 9.6*  --  9.9*  --   HCT 29.4*  --  30.1*  --   PLT 205  --  189  --   APTT  --   --   --  70*  HEPARINUNFRC  --   --   --  0.19*  CREATININE 1.65*  --   --   --   TROPONINIHS 2,778* 2,969*  --   --     Estimated Creatinine Clearance: 33.9 mL/min (A) (by C-G formula based on SCr of 1.65 mg/dL (H)).   Medical History: Past Medical History:  Diagnosis Date   Anemia    Anxiety    Arthritis    Carotid artery disease (HCC)    a. Last carotid study in 2014 showed 1-39% RICA, 40-59% LICA.   Chronic combined systolic (congestive) and diastolic (congestive) heart failure (HCC)    a. EF 40-45% in 2014. b. EF 30-35% in 07/2017.   CKD (chronic kidney disease), stage III (HCC)    Coronary artery disease    a. s/p CABG 2014. b. Cath 07/2017 for NSTEMI -> med rx.   Diabetes mellitus (HCC)    Diabetic peripheral neuropathy (HCC)    Eczema    GERD (gastroesophageal reflux disease)    H/O hiatal hernia    Hyperlipidemia    Hypertension    Myocardial infarction (HCC)    PONV (postoperative nausea and vomiting)    Sleep apnea    Umbilical hernia     Medications:  (Not in a hospital  admission)  Scheduled:   [START ON 02/06/2024] aspirin  EC  81 mg Oral Daily   cilostazol   100 mg Oral BID   DULoxetine   60 mg Oral Daily   ezetimibe   10 mg Oral Daily   furosemide   40 mg Intravenous BID   gabapentin   800 mg Oral TID   insulin  aspart  0-15 Units Subcutaneous TID WC   loratadine   10 mg Oral Daily   metoprolol  succinate  50 mg Oral Daily   pantoprazole   40 mg Oral Daily   rosuvastatin   40 mg Oral Daily   Infusions:   [START ON 02/06/2024] sodium chloride      heparin  900 Units/hr (02/05/24 0906)   PRN: ipratropium-albuterol   Assessment: 73yo female c/o CP associated w/ SOB and upper back pain, troponin found to be elevated >> to begin heparin ; of note pt had some bloody sputum in ED, Hgb down from baseline (12.4 in April), will aim for low heparin  level and avoid boluses.   Patient was not on anticoagulation prior to arrival. Started on 900 units/hr IV heparin  without a bolus. Resulting heparin  level is 0.19 which is sub-therapeutic.  AM pharmacist discussed with RN this morning, no new bloody sputum episodes noted and no issues. Confirmed no change with RN since this morning as well.  Hgb 9.6; plt 9.9  Goal of Therapy:  Heparin  level 0.3-0.7 units/ml Monitor platelets by anticoagulation protocol: Yes   Plan:  Increase heparin  infusion to 1000 units/hr Check anti-Xa level at 2200 Daily HL while on heparin  Continue to monitor H&H and platelets  Dorn Buttner, PharmD, BCPS 02/05/2024 1:25 PM ED Clinical Pharmacist -  [phone]

## 2024-02-05 NOTE — ED Notes (Signed)
 Pt grandson has taken pt's insulin  pump so it does not get lost in the ED

## 2024-02-05 NOTE — Inpatient Diabetes Management (Addendum)
 Inpatient Diabetes Program Recommendations  AACE/ADA: New Consensus Statement on Inpatient Glycemic Control (2015)  Target Ranges:  Prepandial:   less than 140 mg/dL      Peak postprandial:   less than 180 mg/dL (1-2 hours)      Critically ill patients:  140 - 180 mg/dL   Lab Results  Component Value Date   GLUCAP 249 (H) 02/05/2024   HGBA1C 10.6 11/14/2023    Latest Reference Range & Units 02/04/24 19:30 02/05/24 00:31 02/05/24 08:09  Glucose-Capillary 70 - 99 mg/dL 595 (H) 716 (H) 750 (H)  (H): Data is abnormally high  Diabetes history: DM1 (Requires basal, meal coverage and correction insulin ) Outpatient Diabetes medications: [employer] Insulin  Pump Current orders for Inpatient glycemic control: Novolog  0-15 tid correction  Inpatient Diabetes Program Recommendations:   From office note with Benton Rio 11/27/23: 7a-12a basal rate to 1.55 units per hour instead of 2 units per hour given she has had some recent drops. She can continue other settings: target 150; insulin  to carb ratio: 1:15, insulin  sensitivity of 50, active insulin  time of 4 hrs.   Patient has type 1 diabetes and requires basal insulin . Please consider: -Semglee  15 units bid (80% home basal dose) -Novolog  0-9 units q 4 hrs. While NPO  12:45 pm Spoke with patient  and her son @ bedside. Patient was diagnosed with type 1 diabetes @ age 73. Her son also has type 1 diabetes. Reviewed with patient the doctor plans to restart her insulin  pump as appropriate or add basal insulin . Patient is in the process of switching from [place] [employer] to [facility] G7 sensors when she uses the [place] [employer] supply she has @ home. Son has insulin  pump and supplies with him.  Requested a 71 year Journey Award from [employer] for patient. Patient was diagnosed @ age 73 so has managed type 1 for 65 years. Congratulations!  Thank you, Deric Bocock E. Broady Lafoy, RN, MSN, CDCES  Diabetes Coordinator Inpatient Glycemic Control Team Team Pager [phone]  (8am-5pm) 02/05/2024 10:50 AM

## 2024-02-05 NOTE — ED Notes (Signed)
 Checked patient cbg it was 249 patient has family at bedside and call bell in reach

## 2024-02-05 NOTE — H&P (Addendum)
 History and Physical    Patient: Kiara [name] FMW:[id number] DOB: October 12, 1950 DOA: 02/04/2024 DOS: the patient was seen and examined on 02/05/2024 PCP: Lawrance Handing, MD  Patient coming from: Home  Chief Complaint:  Chief Complaint  Patient presents with   Shortness of Breath   HPI: Kiara [name] is a 73 y.o. female with medical history significant of HFpEF, CAD s/p CABG in 2014, CKD3, HTN, HLD, and DM2 p/w cp and found to have elevated troponin and BNP c/f ACS.   Pt states that she was in her USOH until 0800 when she awoke from her sleep w/ acute onset SOB. She endorses intermittent cp lasting 5-32min over the past few days that worsens with activities like walking/talking and improves with rest. She reports following a strict low sodium diet and denies any recent weight gain or LE edema to accompany her SOB.   In the ED, pt tachycardic and tachypneic on 4L [place]. Labs notable for K 3.2, Cr 1.65, BNP 1264, troponin 2778-->2969, and WBC 15.9. CTA chest neg for PE, but did show b/l pleural effusions. Pt started on hep gtt for ACS, cards consulted per EDP, and pt admitted to medicine for ongoing w/u.  Review of Systems: As mentioned in the history of present illness. All other systems reviewed and are negative. Past Medical History:  Diagnosis Date   Anemia    Anxiety    Arthritis    Carotid artery disease (HCC)    a. Last carotid study in 2014 showed 1-39% RICA, 40-59% LICA.   Chronic combined systolic (congestive) and diastolic (congestive) heart failure (HCC)    a. EF 40-45% in 2014. b. EF 30-35% in 07/2017.   CKD (chronic kidney disease), stage III (HCC)    Coronary artery disease    a. s/p CABG 2014. b. Cath 07/2017 for NSTEMI -> med rx.   Diabetes mellitus (HCC)    Diabetic peripheral neuropathy (HCC)    Eczema    GERD (gastroesophageal reflux disease)    H/O hiatal hernia    Hyperlipidemia    Hypertension    Myocardial infarction (HCC)    PONV (postoperative nausea and  vomiting)    Sleep apnea    Umbilical hernia    Past Surgical History:  Procedure Laterality Date   ABDOMINAL AORTOGRAM W/LOWER EXTREMITY N/A 03/06/2018   Procedure: ABDOMINAL AORTOGRAM W/LOWER EXTREMITY;  Surgeon: Darron Deatrice LABOR, MD;  Location: [facility];  Service: Cardiovascular;  Laterality: N/A;   ABDOMINAL AORTOGRAM W/LOWER EXTREMITY N/A 05/16/2023   Procedure: ABDOMINAL AORTOGRAM W/LOWER EXTREMITY;  Surgeon: Darron Deatrice LABOR, MD;  Location: [facility];  Service: Cardiovascular;  Laterality: N/A;   ABDOMINAL HYSTERECTOMY     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     CARDIAC CATHETERIZATION     CARPAL TUNNEL RELEASE     CESAREAN SECTION  1983   CORONARY ARTERY BYPASS GRAFT N/A 04/11/2013   Procedure: CORONARY ARTERY BYPASS GRAFTING (CABG);  Surgeon: Dorise MARLA Fellers, MD;  Location: [facility] OR;  Service: Open Heart Surgery;  Laterality: N/A;   EYE SURGERY Bilateral    laser   FRACTURE SURGERY Left 2005   PERIPHERAL INTRAVASCULAR LITHOTRIPSY  05/16/2023   Procedure: PERIPHERAL INTRAVASCULAR LITHOTRIPSY;  Surgeon: Darron Deatrice LABOR, MD;  Location: [facility];  Service: Cardiovascular;;   PERIPHERAL VASCULAR INTERVENTION  05/16/2023   Procedure: PERIPHERAL VASCULAR INTERVENTION;  Surgeon: Darron Deatrice LABOR, MD;  Location: [facility];  Service: Cardiovascular;;  RIGHT/LEFT HEART CATH AND CORONARY/GRAFT ANGIOGRAPHY N/A 08/06/2017   Procedure: RIGHT/LEFT HEART CATH AND CORONARY/GRAFT ANGIOGRAPHY;  Surgeon: Mady Bruckner, MD;  Location: [facility];  Service: Cardiovascular;  Laterality: N/A;   Social History:  reports that she quit smoking about 13 years ago. Her smoking use included cigarettes. She started smoking about 38 years ago. She has never been exposed to tobacco smoke. She has never used smokeless tobacco. She reports that she does not drink alcohol and does not use drugs.  Allergies  Allergen Reactions   Bactrim [Sulfamethoxazole-Trimethoprim]  Rash   Codeine Nausea And Vomiting   Silvadene [Silver Sulfadiazine] Other (See Comments)    Red hot rash and blisters   Adhesive [Tape] Rash    Band aid brand     Mobic [Meloxicam] Swelling   Morphine  And Codeine Nausea Only and Other (See Comments)    Confusion     Family History  Problem Relation Age of Onset   Heart attack Mother    Heart disease Mother    Hypertension Mother    Heart attack Brother    Heart disease Brother    Hypertension Brother     Prior to Admission medications   Medication Sig Start Date End Date Taking? Authorizing Provider  acetaminophen  (TYLENOL ) 650 MG CR tablet Take 1,300 mg by mouth every 8 (eight) hours as needed for pain.    [provider]  albuterol  (VENTOLIN  HFA) 108 (90 Base) MCG/ACT inhaler Inhale 2 puffs into the lungs every 4 (four) hours as needed for shortness of breath or wheezing. 04/16/13   [provider]  aspirin  EC 81 MG tablet Take 81 mg by mouth daily.     [provider]  Cholecalciferol (VITAMIN D ) 2000 units tablet Take 2,000 Units by mouth daily.    [provider]  cilostazol  (PLETAL ) 100 MG tablet Take 1 tablet (100 mg total) by mouth 2 (two) times daily. 12/10/23   Mallipeddi, Vishnu P, MD  Cinnamon 500 MG capsule Take 500 mg by mouth daily.    [provider]  Coenzyme Q10 (COQ10) 100 MG CAPS Take 100 mg by mouth daily.     [provider]  COLLAGEN PO Take 1,000 mg by mouth daily.    [provider]  DULoxetine  (CYMBALTA ) 60 MG capsule Take 60 mg by mouth daily. 04/09/23   [provider]  erythromycin  ophthalmic ointment Place 1 Application into both eyes in the morning and at bedtime. 0.25 strip 12/27/23   [provider]  ezetimibe  (ZETIA ) 10 MG tablet Take 1 tablet (10 mg total) by mouth daily. 12/10/23   Mallipeddi, Vishnu P, MD  fluticasone  (FLONASE ) 50 MCG/ACT nasal spray Place 2 sprays into both nostrils at bedtime as needed for allergies or  rhinitis.    [provider]  furosemide  (LASIX ) 40 MG tablet Take 1 tablet (40 mg total) by mouth 2 (two) times daily. 09/17/23   Meng, Hao, PA  gabapentin  (NEURONTIN ) 800 MG tablet Take 800 mg by mouth 3 (three) times daily.  07/29/13   [provider]  insulin  aspart (NOVOLOG ) 100 UNIT/ML injection Inject into the skin See admin instructions. 1.25 Units 12 Am-7 Am,  1.75 u/hr 7 AM  Til 12 AM, Bolus 1 unit for every 15 carbs, 1 Unit for every 50 points greater than 150 Insulin  pump 11/12/13   [provider]  levocetirizine (XYZAL ) 5 MG tablet Take 5 mg by mouth daily.  07/21/13   [provider]  metoprolol  succinate (TOPROL -XL) 25 MG 24 hr tablet TAKE 1 TABLET TWICE DAILY 11/06/23   Mallipeddi, Vishnu P, MD  montelukast (SINGULAIR) 10 MG tablet Take 10 mg by mouth daily. 10/30/22   [provider]  neomycin-polymyxin b-dexamethasone  (MAXITROL) 3.5-10000-0.1 OINT Place 1 Application into both eyes at bedtime.    [provider]  ofloxacin  (OCUFLOX ) 0.3 % ophthalmic solution Place 1 drop into the left eye 4 (four) times daily. 01/24/24   [provider]  omeprazole (PRILOSEC) 40 MG capsule Take 40 mg by mouth daily. 01/23/19   [provider]  prednisoLONE  acetate (PRED FORTE ) 1 % ophthalmic suspension Place 1 drop into the left eye 4 (four) times daily. 01/24/24   [provider]  rosuvastatin  (CRESTOR ) 40 MG tablet TAKE ONE TABLET BY MOUTH ONE TIME A DAY. STOP LIPITOR  03/12/23   Lavona Agent, MD  Turmeric 500 MG TABS Take 1 tablet by mouth daily.    [provider]    Physical Exam: Vitals:   02/05/24 0327 02/05/24 0328 02/05/24 0330 02/05/24 0827  BP: 96/78 (!) 132/46 (!) 140/66 119/62  Pulse: 100 98 (!) 108 88  Resp: 20 20 16    Temp:  99.3 F (37.4 C) 99.3 F (37.4 C)   TempSrc:  Oral    SpO2: (!) 66% 93% 93%   Weight:      Height:       General: Alert, oriented x3, resting comfortably in no acute  distress Respiratory: Bibasilar rales; no wheezing Cardiovascular: Regular rate and rhythm w/o m/r/g   Data Reviewed:  Lab Results  Component Value Date   WBC 15.9 (H) 02/05/2024   HGB 9.9 (L) 02/05/2024   HCT 30.1 (L) 02/05/2024   MCV 92.3 02/05/2024   PLT 189 02/05/2024   Lab Results  Component Value Date   GLUCOSE 392 (H) 02/04/2024   CALCIUM  8.4 (L) 02/04/2024   NA 135 02/04/2024   K 3.2 (L) 02/04/2024   CO2 29 02/04/2024   CL 92 (L) 02/04/2024   BUN 26 (H) 02/04/2024   CREATININE 1.65 (H) 02/04/2024   Lab Results  Component Value Date   ALT 20 08/22/2017   AST 28 08/22/2017   ALKPHOS 94 08/22/2017   BILITOT 0.5 08/22/2017   Lab Results  Component Value Date   INR 1.21 08/03/2017   INR 1.41 04/11/2013   INR 1.02 04/09/2013    Radiology: CT Angio Chest Pulmonary Embolism (PE) W or WO Contrast Result Date: 02/04/2024 CLINICAL DATA:  Acute aortic syndrome suspected. Shortness of breath and back pain. Intermittent chest pain. EXAM: CT ANGIOGRAPHY CHEST WITH CONTRAST TECHNIQUE: Multidetector CT imaging of the chest was performed using the standard protocol during bolus administration of intravenous contrast. Multiplanar CT image reconstructions and MIPs were obtained to evaluate the vascular anatomy. RADIATION DOSE REDUCTION: This exam was performed according to the departmental dose-optimization program which includes automated exposure control, adjustment of the mA and/or kV according to patient size and/or use of iterative reconstruction technique. CONTRAST:  75mL OMNIPAQUE  IOHEXOL  350 MG/ML SOLN COMPARISON:  Chest radiograph 02/04/2024 FINDINGS: Cardiovascular: Unenhanced images of the chest demonstrate calcification of the aorta. No acute intramural hematoma. Postoperative changes with sternotomy wires and surgical clips in the mediastinum consistent with coronary bypass. Images obtained after contrast material administration demonstrate normal caliber thoracic aorta. No  aortic dissection. Great vessel origins are patent. Mild cardiac enlargement. No pericardial effusions. Central pulmonary arteries are well opacified. No evidence of significant pulmonary embolus. Mediastinum/Nodes: Esophagus is decompressed.  Thyroid  gland is unremarkable. Prominent mediastinal lymph nodes with largest left aortopulmonic window nodes measuring up to 1.5 cm in diameter. Appearance is nonspecific. Lungs/Pleura: Small bilateral pleural effusions. Atelectasis or consolidation in the lung bases. Diffuse thickening of interlobular septa likely representing edema. Upper Abdomen: No acute abnormalities demonstrated. Musculoskeletal: Sternotomy wires. Degenerative changes in the spine. No acute bony abnormalities. Review of the MIP images confirms the above findings. IMPRESSION: 1. Aortic atherosclerosis. No evidence of aortic aneurysm or dissection. 2. No evidence of significant pulmonary embolus. 3. Nonspecific lymphadenopathy in the mediastinum. 4. Bilateral pleural effusions with basilar atelectasis or consolidation. 5. Diffuse interstitial thickening suggesting pulmonary edema. Electronically Signed   By: Elsie Gravely M.D.   On: 02/04/2024 23:59   DG Chest 2 View Result Date: 02/04/2024 CLINICAL DATA:  Shortness of breath EXAM: CHEST - 2 VIEW COMPARISON:  Chest radiograph August 04, 2017 FINDINGS: The heart size and mediastinal contours are within it borderline normal limits. Blunting of bilateral costophrenic angles left greater than right suggestive of mild pleural effusion/atelectasis, improved to prior exam from 2019. No suspicious new consolidation or pulmonary nodule. Status post median sternotomy. The visualized skeletal structures are unremarkable. IMPRESSION: Suggestion of bilateral small pleural effusions. Otherwise no active cardiopulmonary disease. Status post CABG. Electronically Signed   By: Megan  Zare M.D.   On: 02/04/2024 20:01    Assessment and Plan: 89F h/o HFpEF, CAD s/p  CABG in 2014, CKD3, HTN, HLD, DM2 p/w cp and found to have elevated troponin and BNP c/f ACS.   ACS Elevated BNP H/o HFpEF H/o CAD s/p CABG -Cards consulted; apprec eval/recs -Continue hep gtt for now -IV lasix  40mg  BID for now; goal net neg 1-2L/d; strict I/Os; daily standing weights; K>4/Mg>2 -PTA metop XL 50mg  daily (pta 25mg  BID for unclear reasons)  HLD -PTA Crestor  40mg  daily  DM2 -Diabetes coordinator consulted; recs: Semglee  15U BID + SSI TID AC prn alone for now  CKD3 Baseline Cr 1.7; Admission Cr 1.65 -Diuretics per above -Strict I&Os and daily weights (standing preferred) -F/u BMP daily -Renally dose medications for CrCl -Avoid lovenox , NSAIDs, morphine , Fleet's phosphate enema, regular insulin , contrast; no gadolinium for MRI to avoid nephrogenic systemic fibrosis -Consider renal US  and nephrology consult if worsening AKI or lack of improvementening AKI or lack of improvement   Advance Care Planning:   Code Status: Full Code   Consults: Cards  Family Communication: Eva, son  Severity of Illness: The appropriate patient status for this patient is INPATIENT. Inpatient status is judged to be reasonable and necessary in order to provide the required intensity of service to ensure the patient's safety. The patient's presenting symptoms, physical exam findings, and initial radiographic and laboratory data in the context of their chronic comorbidities is felt to place them at high risk for further clinical deterioration. Furthermore, it is not anticipated that the patient will be medically stable for discharge from the hospital within 2 midnights of admission.   * I certify that at the point of admission it is my clinical judgment that the patient will require inpatient hospital care spanning beyond 2 midnights from the point of admission due to high intensity of service, high risk for further deterioration and high frequency of surveillance required.*   ------- I  spent 55 minutes reviewing previous labs/notes, obtaining separate history at the bedside, counseling/discussing the treatment plan outlined above, ordering medications/tests, and performing clinical documentation.  Author: Marsha Ada, MD 02/05/2024 2:38 PM  For on call review [email or web address].

## 2024-02-05 NOTE — ED Notes (Signed)
 Help patient up to the bedside toilet patient did well patient has family at bedside and call bell in reach

## 2024-02-05 NOTE — ED Provider Notes (Signed)
  Physical Exam  BP (!) 140/66   Pulse (!) 108   Temp 99.3 F (37.4 C)   Resp 16   Ht 5' 3 (1.6 m)   Wt 90.7 kg   SpO2 93%   BMI 35.43 kg/m   Physical Exam  Procedures  Procedures  ED Course / MDM   Clinical Course as of 02/05/24 0526  Mon Feb 04, 2024  2342 With on-call cardiology fellow , patient here with chest and back pain, some shortness of breath and developed vomitus in the ER x 1 but did have frank blood that she coughed up here.  His recommendation was workup for mopped assist before heparinization, trend troponin and hemoglobin, admit to [facility] and keep n.p.o.  Patient is going to order dissection study because she is having some back pain as well, but after discussion with CT tech, hemoptysis protocol will be PE study, given that she had hemoptysis, shortness of breath, has elevated BNP and Trope I am more concerned about PE than dissection, she additionally she states the back pain has been present for couple of months, is just worse today. [CB]  Tue Feb 05, 2024  0041 Patient is pain-free, agree with plan to be admitted down to [facility].  And is ordered but will do infusion only, not bolus as patient did have frank hemoptysis approximately 3 cc of blood, unknown cause [CB]    Clinical Course User Index [CB] Suellen Sherran LABOR, PA-C   Medical Decision Making Amount and/or Complexity of Data Reviewed Labs: ordered. Radiology: ordered.  Risk OTC drugs. Prescription drug management.   Patient presents as a transfer from [facility] for admission due to NSTEMI.  In short, 73 year old female patient complaining of chest pain and shortness of breath which started the night before.  Troponins found to be greater than 2000.  Cardiology recommended heparin  infusion and transferred to Kiara [name] for admission to medicine service.  I requested consultation with the medicine service. Dr.Kakrakandy with [facility] hospitalist agrees to see the patient for  admission       Kiara [name] 02/05/24 [id number]    Trine Raynell Moder, MD 02/05/24 [phone]

## 2024-02-05 NOTE — ED Notes (Signed)
Placed patient back on the monitor 

## 2024-02-05 NOTE — Progress Notes (Signed)
 PHARMACY - ANTICOAGULATION CONSULT NOTE  Pharmacy Consult for heparin  Indication: chest pain/ACS  Allergies  Allergen Reactions   Bactrim [Sulfamethoxazole-Trimethoprim] Rash   Codeine Nausea And Vomiting   Silvadene [Silver Sulfadiazine] Other (See Comments)    Red hot rash and blisters   Adhesive [Tape] Rash    Band aid brand     Mobic [Meloxicam] Swelling   Morphine  And Codeine Nausea Only and Other (See Comments)    Confusion     Patient Measurements: Height: 5' 3 (160 cm) Weight: 90.7 kg (200 lb) IBW/kg (Calculated) : 52.4 HEPARIN  DW (KG): 73.1  Vital Signs: Temp: 98.8 F (37.1 C) (07/07 2315) Temp Source: Oral (07/07 2315) BP: 100/87 (07/07 2315) Pulse Rate: 100 (07/07 2315)  Labs: Recent Labs    02/04/24 1959  HGB 9.6*  HCT 29.4*  PLT 205  CREATININE 1.65*  TROPONINIHS 2,778*    Estimated Creatinine Clearance: 33.9 mL/min (A) (by C-G formula based on SCr of 1.65 mg/dL (H)).   Medical History: Past Medical History:  Diagnosis Date   Anemia    Anxiety    Arthritis    Carotid artery disease (HCC)    a. Last carotid study in 2014 showed 1-39% RICA, 40-59% LICA.   Chronic combined systolic (congestive) and diastolic (congestive) heart failure (HCC)    a. EF 40-45% in 2014. b. EF 30-35% in 07/2017.   CKD (chronic kidney disease), stage III (HCC)    Coronary artery disease    a. s/p CABG 2014. b. Cath 07/2017 for NSTEMI -> med rx.   Diabetes mellitus (HCC)    Diabetic peripheral neuropathy (HCC)    Eczema    GERD (gastroesophageal reflux disease)    H/O hiatal hernia    Hyperlipidemia    Hypertension    Myocardial infarction (HCC)    PONV (postoperative nausea and vomiting)    Sleep apnea    Umbilical hernia    Assessment: 73yo female c/o CP associated w/ SOB and upper back pain, troponin found to be elevated >> to begin heparin ; of note pt had some bloody sputum in ED, Hgb down from baseline (12.4 in April), will aim for low heparin  level and  avoid boluses.  Goal of Therapy:  Heparin  level 0.3-0.5 units/ml Monitor platelets by anticoagulation protocol: Yes   Plan:  Heparin  infusion at 900 units/hr. Monitor heparin  levels and CBC.  Marvetta Dauphin, PharmD, BCPS  02/05/2024,12:37 AM

## 2024-02-05 NOTE — Consult Note (Addendum)
 Cardiology Consultation   Patient ID: Kiara [name] MRN: [id number]; DOB: 10/19/1950  Admit date: 02/04/2024 Date of Consult: 02/05/2024  PCP:  Lawrance Handing, MD   [facility] Providers Cardiologist:  Diannah SHAUNNA [name], MD  [facility] Cardiologist:  Deatrice Cage, MD    Patient Profile: Kiara [name] is a 73 y.o. female with a hx of CAD s/p CABG x 2 in 2014, chronic HFimpEF, diabetes, hyperlipidemia, PAD, CKD stage 3a who is being seen 02/05/2024 for the evaluation of NSTEMI at the request of Dr. Georgina.  History of Present Illness: Kiara [name] has past medical history as stated above. She presented to [facility] ED on 02/04/2024 complaining of chest pain and shortness of breath that started the night prior. She reported upper back pain that had been ongoing for several weeks but was worse with the new onset of chest pain. She rports that the chest pain was 7/10 with upper back pain 9/10.   Relevant workup in the ED/since admission includes: troponin 2,778 > 2,969, BNP 1,264, CBC showed anemia, leukocytosis, CXR showed small bilateral pleural effusions, CTPA showed no PE, aortic atherosclerosis, pulmonary edema, bilateral pleural effusions.   With troponin levels over 2k with chest pain and history of CAD, it was determined that patient would be transferred to [facility] and stared on IV heparin . While in the ED she experienced an episode vomiting along with some hemoptysis.   Prior cardiac history includes CABG x 2 (LIMA to LAD, SVG to right PDA ) by Dr. Lucas in 03/2013. She underwent RHC/LHC in 2019 that showed LIMA to LAD, patent SVG to right PDA, severe pulmonary hypertension. In 2019, she developed claudication symptoms. 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 She recent saw Dr.  Cage 12/25/2023 and he discussed plan is to proceed with angiography and possible endovascular intervention in the near future but at that time she was dealing with lens fragment removal of her left eye that she want to resolve first.   Her home medications as of 11/2023 included: ASA 81 mg daily, Pletal  100 mg BID, Zetia  10 mg daily, Lasix  40 mg BID, Toprol  25 mg BID, Crestor  40 mg daily.   After speaking with the patient, she tells me that she was doing well until [place]. She reports several episodes of diarrhea, that has since resolved. Then over Sunday into Monday she still felt weak but then began having the arm/back pain that developed into chest pain and shortness of breath. She does not typically wear oxygen at baseline but she felt so short of breath that she felt as if she required supplemental oxygen. She called her family and they brought her to [facility] ED.   Past Medical History:  Diagnosis Date   Anemia    Anxiety    Arthritis    Carotid artery disease (HCC)    a. Last carotid study in 2014 showed 1-39% RICA, 40-59% LICA.   Chronic combined systolic (congestive) and diastolic (congestive) heart failure (HCC)    a. EF 40-45% in 2014. b. EF 30-35% in 07/2017.   CKD (chronic kidney disease), stage III (HCC)    Coronary artery disease    a. s/p CABG 2014. b. Cath 07/2017 for NSTEMI -> med rx.  Diabetes mellitus (HCC)    Diabetic peripheral neuropathy (HCC)    Eczema    GERD (gastroesophageal reflux disease)    H/O hiatal hernia    Hyperlipidemia    Hypertension    Myocardial infarction (HCC)    PONV (postoperative nausea and vomiting)    Sleep apnea    Umbilical hernia    Past Surgical History:  Procedure Laterality Date   ABDOMINAL AORTOGRAM W/LOWER EXTREMITY N/A 03/06/2018   Procedure: ABDOMINAL AORTOGRAM W/LOWER EXTREMITY;  Surgeon: Darron Deatrice LABOR, MD;  Location: [facility];  Service:  Cardiovascular;  Laterality: N/A;   ABDOMINAL AORTOGRAM W/LOWER EXTREMITY N/A 05/16/2023   Procedure: ABDOMINAL AORTOGRAM W/LOWER EXTREMITY;  Surgeon: Darron Deatrice LABOR, MD;  Location: [facility];  Service: Cardiovascular;  Laterality: N/A;   ABDOMINAL HYSTERECTOMY     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     CARDIAC CATHETERIZATION     CARPAL TUNNEL RELEASE     CESAREAN SECTION  1983   CORONARY ARTERY BYPASS GRAFT N/A 04/11/2013   Procedure: CORONARY ARTERY BYPASS GRAFTING (CABG);  Surgeon: Dorise MARLA Fellers, MD;  Location: [facility] OR;  Service: Open Heart Surgery;  Laterality: N/A;   EYE SURGERY Bilateral    laser   FRACTURE SURGERY Left 2005   PERIPHERAL INTRAVASCULAR LITHOTRIPSY  05/16/2023   Procedure: PERIPHERAL INTRAVASCULAR LITHOTRIPSY;  Surgeon: Darron Deatrice LABOR, MD;  Location: [facility];  Service: Cardiovascular;;   PERIPHERAL VASCULAR INTERVENTION  05/16/2023   Procedure: PERIPHERAL VASCULAR INTERVENTION;  Surgeon: Darron Deatrice LABOR, MD;  Location: [facility];  Service: Cardiovascular;;   RIGHT/LEFT HEART CATH AND CORONARY/GRAFT ANGIOGRAPHY N/A 08/06/2017   Procedure: RIGHT/LEFT HEART CATH AND CORONARY/GRAFT ANGIOGRAPHY;  Surgeon: Mady Bruckner, MD;  Location: [facility];  Service: Cardiovascular;  Laterality: N/A;    Home Medications:  Prior to Admission medications   Medication Sig Start Date End Date Taking? Authorizing Provider  acetaminophen  (TYLENOL ) 650 MG CR tablet Take 1,300 mg by mouth every 8 (eight) hours as needed for pain.    [provider]  albuterol  (VENTOLIN  HFA) 108 (90 Base) MCG/ACT inhaler Inhale 2 puffs into the lungs every 4 (four) hours as needed for shortness of breath or wheezing. 04/16/13   [provider]  aspirin  EC 81 MG tablet Take 81 mg by mouth daily.     [provider]  Cholecalciferol (VITAMIN D ) 2000 units tablet Take 2,000 Units by mouth daily.    [provider]  cilostazol   (PLETAL ) 100 MG tablet Take 1 tablet (100 mg total) by mouth 2 (two) times daily. 12/10/23   Mallipeddi, Vishnu P, MD  Cinnamon 500 MG capsule Take 500 mg by mouth daily.    [provider]  Coenzyme Q10 (COQ10) 100 MG CAPS Take 100 mg by mouth daily.     [provider]  COLLAGEN PO Take 1,000 mg by mouth daily.    [provider]  DULoxetine  (CYMBALTA ) 60 MG capsule Take 60 mg by mouth daily. 04/09/23   [provider]  erythromycin  ophthalmic ointment Place 1 Application into both eyes in the morning and at bedtime. 0.25 strip 12/27/23   [provider]  ezetimibe  (ZETIA ) 10 MG tablet Take 1 tablet (10 mg total) by mouth daily. 12/10/23   Mallipeddi, Vishnu P, MD  fluticasone  (FLONASE ) 50 MCG/ACT nasal spray Place 2 sprays into both nostrils at bedtime as needed for allergies or rhinitis.    [provider]  furosemide  (LASIX ) 40 MG tablet Take 1 tablet (40 mg total) by mouth 2 (two) times daily. 09/17/23   Meng, Hao, PA  gabapentin  (NEURONTIN ) 800 MG tablet Take 800 mg by mouth 3 (three) times daily.  07/29/13   [provider]  insulin  aspart (NOVOLOG ) 100 UNIT/ML injection Inject into the skin See admin instructions. 1.25 Units 12 Am-7 Am,  1.75 u/hr 7 AM  Til 12 AM, Bolus 1 unit for every 15 carbs, 1 Unit for every 50 points greater than 150 Insulin  pump 11/12/13   [provider]  levocetirizine (XYZAL ) 5 MG tablet Take 5 mg by mouth daily.  07/21/13   [provider]  metoprolol  succinate (TOPROL -XL) 25 MG 24 hr tablet TAKE 1 TABLET TWICE DAILY 11/06/23   Mallipeddi, Vishnu P, MD  montelukast (SINGULAIR) 10 MG tablet Take 10 mg by mouth daily. 10/30/22   [provider]  neomycin-polymyxin b-dexamethasone  (MAXITROL) 3.5-10000-0.1 OINT Place 1 Application into both eyes at bedtime.    [provider]  ofloxacin  (OCUFLOX ) 0.3 % ophthalmic solution Place 1 drop into the left eye 4 (four) times daily. 01/24/24    [provider]  omeprazole (PRILOSEC) 40 MG capsule Take 40 mg by mouth daily. 01/23/19   [provider]  prednisoLONE  acetate (PRED FORTE ) 1 % ophthalmic suspension Place 1 drop into the left eye 4 (four) times daily. 01/24/24   [provider]  rosuvastatin  (CRESTOR ) 40 MG tablet TAKE ONE TABLET BY MOUTH ONE TIME A DAY. STOP LIPITOR  03/12/23   Lavona Agent, MD  Turmeric 500 MG TABS Take 1 tablet by mouth daily.    [provider]    Scheduled Meds:  [START ON 02/06/2024] aspirin  EC  81 mg Oral Daily   cilostazol   100 mg Oral BID   DULoxetine   60 mg Oral Daily   ezetimibe   10 mg Oral Daily   furosemide   40 mg Intravenous BID   gabapentin   800 mg Oral TID   insulin  aspart  0-15 Units Subcutaneous TID WC   loratadine   10 mg Oral Daily   metoprolol  succinate  50 mg Oral Daily   pantoprazole   40 mg Oral Daily   rosuvastatin   40 mg Oral Daily   Continuous Infusions:  heparin  900 Units/hr (02/05/24 0906)   PRN Meds: ipratropium-albuterol   Allergies:    Allergies  Allergen Reactions   Bactrim [Sulfamethoxazole-Trimethoprim] Rash   Codeine Nausea And Vomiting   Silvadene [Silver Sulfadiazine] Other (See Comments)    Red hot rash and blisters   Adhesive [Tape] Rash    Band aid brand     Mobic [Meloxicam] Swelling   Morphine  And Codeine Nausea Only and Other (See Comments)    Confusion    Social History:   Social History   Socioeconomic History   Marital status: Widowed    Spouse name: Not on file   Number of children: Not on file   Years of education: Not on file   Highest education level: Not on file  Occupational History   Not on file  Tobacco Use   Smoking status: Former    Current packs/day: 0.00    Types: Cigarettes    Start date: 05/30/1985    Quit date: 05/30/2010    Years since quitting: 13.6    Passive exposure: Never   Smokeless tobacco: Never   Tobacco comments:    STOPPED 2007  Vaping Use   Vaping status: Never  Used  Substance and Sexual Activity  Alcohol use: No   Drug use: No   Sexual activity: Not on file  Other Topics Concern   Not on file  Social History Narrative   Not on file   Social Drivers of Health   Financial Resource Strain: Not on file  Food Insecurity: Not on file  Transportation Needs: Not on file  Physical Activity: Not on file  Stress: Not on file  Social Connections: Not on file  Intimate Partner Violence: Not on file    Family History:   Family History  Problem Relation Age of Onset   Heart attack Mother    Heart disease Mother    Hypertension Mother    Heart attack Brother    Heart disease Brother    Hypertension Brother     ROS:  Please see the history of present illness.  All other ROS reviewed and negative.     Physical Exam/Data: Vitals:   02/05/24 0327 02/05/24 0328 02/05/24 0330 02/05/24 0827  BP: 96/78 (!) 132/46 (!) 140/66 119/62  Pulse: 100 98 (!) 108 88  Resp: 20 20 16    Temp:  99.3 F (37.4 C) 99.3 F (37.4 C)   TempSrc:  Oral    SpO2: (!) 66% 93% 93%   Weight:      Height:       Intake/Output Summary (Last 24 hours) at 02/05/2024 1309 Last data filed at 02/05/2024 0058 Gross per 24 hour  Intake --  Output 450 ml  Net -450 ml      02/04/2024    7:29 PM 12/25/2023   10:01 AM 11/27/2023    9:49 AM  Last 3 Weights  Weight (lbs) 200 lb 199 lb 9.6 oz 205 lb 12.8 oz  Weight (kg) 90.719 kg 90.538 kg 93.35 kg     Body mass index is 35.43 kg/m.   General:  in no acute distress, on 4 L oxygen via [place] HEENT: normal Cardiac:  normal S1, S2; RRR; no murmur  Lungs:  decreased breath sounds at bases R > L, no wheezing, rhonchi or rales  Abd: soft, nontender, no hepatomegaly  Ext: no edema Musculoskeletal:  No deformities, BUE and BLE strength normal and equal Skin: warm and dry  Neuro:  no focal abnormalities noted Psych:  Normal affect   Telemetry:  Telemetry was personally reviewed and demonstrates:  sinus rhythm, HR 80s  Relevant CV  Studies:  RHC/LHC, 2019 performed by Dr. Mady Conclusions: Severe, diffuse native coronary artery disease. Widely patent LIMA-LAD with minimal runoff beyond the distal anastomosis. Patent but poorly visualized SVG-rPDA. Further attempts to engage SVG were not made due to declining respiratory status in the setting of severely elevated left and right heart pressures. Severely elevated left and right heart filling pressures. Severe pulmonary hypertension. Low normal to reduced cardiac output.   Recommendations: Transfer to cardiac ICU for aggressive diuresis and BiPAP (if needed) for shortness of breath in the setting of acute decompensated systolic heart failure. Optimize evidence-based heart failure therapy. If patient does not respond well to diuresis, consultation with advanced heart failure service will need to be considered. If patient develops chest pain or has continued dyspnea when euvolemic, repeat catheterization to better visualize SVG->rPDA could be considered. Aggressive secondary prevention of CAD.   Laboratory Data: High Sensitivity Troponin:   Recent Labs  Lab 02/04/24 1959 02/04/24 2318  TROPONINIHS 2,778* 2,969*     Chemistry Recent Labs  Lab 02/04/24 1959  NA 135  K 3.2*  CL 92*  CO2  29  GLUCOSE 392*  BUN 26*  CREATININE 1.65*  CALCIUM  8.4*  GFRNONAA 33*  ANIONGAP 14    No results for input(s): PROT, ALBUMIN , AST, ALT, ALKPHOS, BILITOT in the last 168 hours. Lipids No results for input(s): CHOL, TRIG, HDL, LABVLDL, LDLCALC, CHOLHDL in the last 168 hours.  Hematology Recent Labs  Lab 02/04/24 1959 02/05/24 1042  WBC 11.4* 15.9*  RBC 3.17* 3.26*  HGB 9.6* 9.9*  HCT 29.4* 30.1*  MCV 92.7 92.3  MCH 30.3 30.4  MCHC 32.7 32.9  RDW 14.1 14.7  PLT 205 189   Thyroid  No results for input(s): TSH, FREET4 in the last 168 hours.  BNP Recent Labs  Lab 02/04/24 1959  BNP 1,264.0*    DDimer No results for input(s): DDIMER  in the last 168 hours.  Radiology/Studies:  CT Angio Chest Pulmonary Embolism (PE) W or WO Contrast Result Date: 02/04/2024 CLINICAL DATA:  Acute aortic syndrome suspected. Shortness of breath and back pain. Intermittent chest pain. EXAM: CT ANGIOGRAPHY CHEST WITH CONTRAST TECHNIQUE: Multidetector CT imaging of the chest was performed using the standard protocol during bolus administration of intravenous contrast. Multiplanar CT image reconstructions and MIPs were obtained to evaluate the vascular anatomy. RADIATION DOSE REDUCTION: This exam was performed according to the departmental dose-optimization program which includes automated exposure control, adjustment of the mA and/or kV according to patient size and/or use of iterative reconstruction technique. CONTRAST:  75mL OMNIPAQUE  IOHEXOL  350 MG/ML SOLN COMPARISON:  Chest radiograph 02/04/2024 FINDINGS: Cardiovascular: Unenhanced images of the chest demonstrate calcification of the aorta. No acute intramural hematoma. Postoperative changes with sternotomy wires and surgical clips in the mediastinum consistent with coronary bypass. Images obtained after contrast material administration demonstrate normal caliber thoracic aorta. No aortic dissection. Great vessel origins are patent. Mild cardiac enlargement. No pericardial effusions. Central pulmonary arteries are well opacified. No evidence of significant pulmonary embolus. Mediastinum/Nodes: Esophagus is decompressed. Thyroid  gland is unremarkable. Prominent mediastinal lymph nodes with largest left aortopulmonic window nodes measuring up to 1.5 cm in diameter. Appearance is nonspecific. Lungs/Pleura: Small bilateral pleural effusions. Atelectasis or consolidation in the lung bases. Diffuse thickening of interlobular septa likely representing edema. Upper Abdomen: No acute abnormalities demonstrated. Musculoskeletal: Sternotomy wires. Degenerative changes in the spine. No acute bony abnormalities. Review of the  MIP images confirms the above findings. IMPRESSION: 1. Aortic atherosclerosis. No evidence of aortic aneurysm or dissection. 2. No evidence of significant pulmonary embolus. 3. Nonspecific lymphadenopathy in the mediastinum. 4. Bilateral pleural effusions with basilar atelectasis or consolidation. 5. Diffuse interstitial thickening suggesting pulmonary edema. Electronically Signed   By: Elsie Gravely M.D.   On: 02/04/2024 23:59   DG Chest 2 View Result Date: 02/04/2024 CLINICAL DATA:  Shortness of breath EXAM: CHEST - 2 VIEW COMPARISON:  Chest radiograph August 04, 2017 FINDINGS: The heart size and mediastinal contours are within it borderline normal limits. Blunting of bilateral costophrenic angles left greater than right suggestive of mild pleural effusion/atelectasis, improved to prior exam from 2019. No suspicious new consolidation or pulmonary nodule. Status post median sternotomy. The visualized skeletal structures are unremarkable. IMPRESSION: Suggestion of bilateral small pleural effusions. Otherwise no active cardiopulmonary disease. Status post CABG. Electronically Signed   By: Megan  Zare M.D.   On: 02/04/2024 20:01   Assessment and Plan:  NSTEMI CAD s/p CABG x 2 in 2014  Hyperlipidemia  Presented to [facility] ED with chest pain, shortness of breath Troponin level 2,778 > 2,969 Started on IV heparin  and transferred to  [facility]  Not currently having any active chest pain Continue on IV heparin   Continue ASA 81 mg daily Continue Zetia  10 mg daily Continue Toprol  50 mg daily Continue Crestor  40 mg daily  Plan for RHC/LHC tomorrow NPO at midnight  Informed Consent   Shared Decision Making/Informed Consent The risks [stroke (1 in 1000), death (1 in 1000), kidney failure [usually temporary] (1 in 500), bleeding (1 in 200), allergic reaction [possibly serious] (1 in 200)], benefits (diagnostic support and management of coronary artery disease) and alternatives of a cardiac catheterization  were discussed in detail with Kiara [name] and she is willing to proceed.     Acute on chronic HFimpEF Echo from 2020 showed: LVEF 55-60% (previously 30-35% in 2019), mild LVH, no LV RWMA BNP 1,264 CXR/CTPA showed bilateral pleural effusions, pulmonary edema Updating echocardiogram Continue IV Lasix   Plan for RHC/LHC tomorrow  NPO at midnight  Per primary CKD stage 3a Diabetes  Sleep apnea   Risk Assessment/Risk Scores:    TIMI Risk Score for Unstable Angina or Non-ST Elevation MI:   The patient's TIMI risk score is 5, which indicates a 26% risk of all cause mortality, new or recurrent myocardial infarction or need for urgent revascularization in the next 14 days.  New [place]  Heart Association (NYHA) Functional Class NYHA Class III       For questions or updates, please contact [facility] Please consult [email or web address] for contact info under    Signed, Waddell DELENA Donath, PA-C  02/05/2024 1:09 PM  Patient seen, examined. Available data reviewed. Agree with findings, assessment, and plan as outlined by Waddell Donath, PA-C.  The patient is independently interviewed and examined.  She is sitting up at the bedside and appears comfortable.  She is having no chest pain at present.  She is alert, oriented, in no distress.  HEENT is normal, JVP is normal, lungs are clear bilaterally, heart is regular rate and rhythm with no murmur gallop, abdomen soft nontender, extremities have no edema, skin is warm and dry with no rash.  Labs are reviewed and are pertinent for both an elevated high-sensitivity troponin of 2969 as well as an elevated BNP of 1264.  She has a leukocytosis with white blood cell count of 15.9, hemoglobin 9.9, and platelet count of 189,000.  Her echocardiogram shows an LVEF of 30% with global LV hypokinesis, poor visualization of the right ventricle, mild mitral regurgitation, and no evidence of aortic stenosis.  The patient's previous echocardiogram in 2020 showed an  LVEF of 55 to 60%.  This patient has a complex cardiac history with history of CABG, pulmonary hypertension, and peripheral arterial disease.  She is now clinically improved and chest pain-free on IV heparin  with improved heart failure symptoms after receiving IV diuretic therapy.  Her chest x-ray shows small bilateral pleural effusions but no other evidence of active cardiopulmonary disease.  A CT angiogram of the chest showed no evidence of aortic aneurysm or dissection and no evidence of pulmonary embolus.  With her significant background medical problems and presentation with both non-STEMI and acute on chronic systolic heart failure, right and left heart catheterization is indicated.  Recommend a femoral approach since she reports failed radial approach as in the past. I have reviewed the risks, indications, and alternatives to cardiac catheterization, possible angioplasty, and stenting with the patient. Risks include but are not limited to bleeding, infection, vascular injury, stroke, myocardial infection, arrhythmia, kidney injury, radiation-related injury in the case of prolonged fluoroscopy use, emergency cardiac  surgery, and death. The patient understands the risks of serious complication is 1-2 in 1000 with diagnostic cardiac cath and 1-2% or less with angioplasty/stenting.  Continue IV heparin , IV furosemide , and other current medical therapies as outlined above.  Ozell Fell, M.D. 02/05/2024 6:14 PM

## 2024-02-05 NOTE — ED Notes (Signed)
 Pt removed at home continuous glucose monitoring system from the back of left arm. Pt states she would rather the hospital take CBG's and give her correctional insulin  than for her to worry about it.

## 2024-02-05 NOTE — ED Notes (Signed)
 RN called CCMD

## 2024-02-06 DIAGNOSIS — N179 Acute kidney failure, unspecified: Secondary | ICD-10-CM | POA: Insufficient documentation

## 2024-02-06 DIAGNOSIS — I214 Non-ST elevation (NSTEMI) myocardial infarction: Secondary | ICD-10-CM | POA: Diagnosis not present

## 2024-02-06 DIAGNOSIS — N1832 Chronic kidney disease, stage 3b: Secondary | ICD-10-CM | POA: Insufficient documentation

## 2024-02-06 DIAGNOSIS — E1165 Type 2 diabetes mellitus with hyperglycemia: Secondary | ICD-10-CM

## 2024-02-06 LAB — GLUCOSE, CAPILLARY
Glucose-Capillary: 513 mg/dL (ref 70–99)
Glucose-Capillary: 484 mg/dL — ABNORMAL HIGH (ref 70–99)
Glucose-Capillary: 423 mg/dL — ABNORMAL HIGH (ref 70–99)
Glucose-Capillary: 431 mg/dL — ABNORMAL HIGH (ref 70–99)
Glucose-Capillary: 259 mg/dL — ABNORMAL HIGH (ref 70–99)

## 2024-02-06 LAB — URINALYSIS, ROUTINE W REFLEX MICROSCOPIC
Bilirubin Urine: NEGATIVE
Glucose, UA: >=500 mg/dL — AB
Hgb urine dipstick: NEGATIVE
Ketones, ur: NEGATIVE mg/dL
Leukocytes,Ua: NEGATIVE
Nitrite: NEGATIVE
Protein, ur: 100 mg/dL — AB
Specific Gravity, Urine: 1.022 (ref 1.005–1.030)
pH: 5 (ref 5.0–8.0)

## 2024-02-06 LAB — CBC
HCT: 26.7 % — ABNORMAL LOW (ref 36.0–46.0)
Hemoglobin: 8.7 g/dL — ABNORMAL LOW (ref 12.0–15.0)
MCH: 29.9 pg (ref 26.0–34.0)
MCHC: 32.6 g/dL (ref 30.0–36.0)
MCV: 91.8 fL (ref 80.0–100.0)
Platelets: 184 K/uL (ref 150–400)
RBC: 2.91 MIL/uL — ABNORMAL LOW (ref 3.87–5.11)
RDW: 14.5 % (ref 11.5–15.5)
WBC: 12.9 K/uL — ABNORMAL HIGH (ref 4.0–10.5)
nRBC: 0 % (ref 0.0–0.2)

## 2024-02-06 LAB — BASIC METABOLIC PANEL WITH GFR
Anion gap: 13 (ref 5–15)
BUN: 38 mg/dL — ABNORMAL HIGH (ref 8–23)
CO2: 24 mmol/L (ref 22–32)
Calcium: 8.2 mg/dL — ABNORMAL LOW (ref 8.9–10.3)
Chloride: 91 mmol/L — ABNORMAL LOW (ref 98–111)
Creatinine, Ser: 2.19 mg/dL — ABNORMAL HIGH (ref 0.44–1.00)
GFR, Estimated: 24 mL/min — ABNORMAL LOW (ref >60)
Glucose, Bld: 482 mg/dL — ABNORMAL HIGH (ref 70–99)
Potassium: 4.4 mmol/L (ref 3.5–5.1)
Sodium: 128 mmol/L — ABNORMAL LOW (ref 135–145)

## 2024-02-06 LAB — TROPONIN I (HIGH SENSITIVITY): Troponin I (High Sensitivity): 2517 ng/L

## 2024-02-06 LAB — HEPARIN LEVEL (UNFRACTIONATED): Heparin Unfractionated: 0.29 [IU]/mL — ABNORMAL LOW (ref 0.30–0.70)

## 2024-02-06 MED ORDER — INSULIN ASPART 100 UNIT/ML IJ SOLN: 5.0000 [IU] | Freq: Three times a day (TID) | INTRAMUSCULAR | Status: DC

## 2024-02-06 MED ORDER — IPRATROPIUM-ALBUTEROL 0.5-2.5 (3) MG/3ML IN SOLN
3.0000 mL | Freq: Four times a day (QID) | RESPIRATORY_TRACT | Status: DC | PRN
  Administered 2024-02-06 – 2024-02-07 (×2): 3 mL via RESPIRATORY_TRACT
  Filled 2024-02-06 (×2): qty 3

## 2024-02-06 MED ORDER — ASPIRIN 81 MG PO CHEW
81.0000 mg | CHEWABLE_TABLET | ORAL | Status: AC
  Administered 2024-02-06: 81 mg via ORAL
  Filled 2024-02-06: qty 1

## 2024-02-06 MED ORDER — CLOPIDOGREL BISULFATE 75 MG PO TABS: 300.0000 mg | ORAL_TABLET | Freq: Once | ORAL | Status: DC

## 2024-02-06 MED ORDER — INSULIN ASPART 100 UNIT/ML IJ SOLN
7.0000 [IU] | Freq: Three times a day (TID) | INTRAMUSCULAR | Status: DC
  Administered 2024-02-06 – 2024-02-11 (×9): 7 [IU] via SUBCUTANEOUS

## 2024-02-06 MED ORDER — FLUTICASONE PROPIONATE 50 MCG/ACT NA SUSP
2.0000 | Freq: Every day | NASAL | Status: DC
  Administered 2024-02-07 – 2024-02-14 (×3): 2 via NASAL
  Filled 2024-02-06: qty 16

## 2024-02-06 MED ORDER — ASPIRIN 81 MG PO CHEW: 81.0000 mg | CHEWABLE_TABLET | ORAL | Status: DC

## 2024-02-06 MED ORDER — FENTANYL CITRATE PF 50 MCG/ML IJ SOSY
12.5000 ug | PREFILLED_SYRINGE | Freq: Once | INTRAMUSCULAR | Status: AC | PRN
  Administered 2024-02-06: 12.5 ug via INTRAVENOUS
  Filled 2024-02-06: qty 1

## 2024-02-06 MED ORDER — CLOPIDOGREL BISULFATE 75 MG PO TABS
75.0000 mg | ORAL_TABLET | Freq: Every day | ORAL | Status: DC
  Administered 2024-02-07 – 2024-02-12 (×6): 75 mg via ORAL
  Filled 2024-02-06 (×6): qty 1

## 2024-02-06 MED ORDER — INSULIN ASPART 100 UNIT/ML IJ SOLN
5.0000 [IU] | Freq: Once | INTRAMUSCULAR | Status: AC
  Administered 2024-02-06: 5 [IU] via SUBCUTANEOUS

## 2024-02-06 MED ORDER — INSULIN GLARGINE-YFGN 100 UNIT/ML ~~LOC~~ SOLN
18.0000 [IU] | Freq: Two times a day (BID) | SUBCUTANEOUS | Status: DC
  Administered 2024-02-06 – 2024-02-08 (×4): 18 [IU] via SUBCUTANEOUS
  Filled 2024-02-06 (×7): qty 0.18

## 2024-02-06 MED ORDER — ALUM & MAG HYDROXIDE-SIMETH 200-200-20 MG/5ML PO SUSP
30.0000 mL | ORAL | Status: DC | PRN
  Administered 2024-02-06 – 2024-02-14 (×3): 30 mL via ORAL
  Filled 2024-02-06 (×3): qty 30

## 2024-02-06 NOTE — Progress Notes (Signed)
 ANTICOAGULATION CONSULT NOTE  Pharmacy Consult for Heparin  Indication: chest pain/ACS  Allergies  Allergen Reactions   Bactrim [Sulfamethoxazole-Trimethoprim] Rash   Codeine Nausea And Vomiting   Hydromorphone  Nausea Only and Other (See Comments)    Diaphoretic, blood pressure issues, coughing,   Silvadene [Silver Sulfadiazine] Other (See Comments)    Red hot rash and blisters   Adhesive [Tape] Rash    Band aid brand     Mobic [Meloxicam] Swelling   Morphine  And Codeine Nausea Only and Other (See Comments)    Confusion     Patient Measurements: Height: 5' 3 (160 cm) Weight: 92.7 kg (204 lb 5.9 oz) IBW/kg (Calculated) : 52.4 Heparin  Dosing Weight: 73.1 kg  Vital Signs: Temp: 98.3 F (36.8 C) (07/09 0815) Temp Source: Oral (07/09 0815) BP: 151/72 (07/09 0815) Pulse Rate: 85 (07/09 0815)  Labs: Recent Labs    02/04/24 1959 02/04/24 2318 02/05/24 1042 02/05/24 1130 02/05/24 1848 02/05/24 2324 02/06/24 0316 02/06/24 0754  HGB 9.6*  --  9.9*  --   --   --  8.7*  --   HCT 29.4*  --  30.1*  --   --   --  26.7*  --   PLT 205  --  189  --   --   --  184  --   APTT  --   --   --  70*  --   --   --   --   HEPARINUNFRC  --   --   --  0.19*  --  0.17*  --  0.29*  CREATININE 1.65*  --   --   --  1.95*  --  2.19*  --   TROPONINIHS 2,778* 2,969*  --   --   --   --  2,517*  --     Estimated Creatinine Clearance: 25.8 mL/min (A) (by C-G formula based on SCr of 2.19 mg/dL (H)).   Medical History: Past Medical History:  Diagnosis Date   Anemia    Anxiety    Arthritis    Carotid artery disease (HCC)    a. Last carotid study in 2014 showed 1-39% RICA, 40-59% LICA.   Chronic combined systolic (congestive) and diastolic (congestive) heart failure (HCC)    a. EF 40-45% in 2014. b. EF 30-35% in 07/2017.   CKD (chronic kidney disease), stage III (HCC)    Coronary artery disease    a. s/p CABG 2014. b. Cath 07/2017 for NSTEMI -> med rx.   Diabetes mellitus (HCC)    Diabetic  peripheral neuropathy (HCC)    Eczema    GERD (gastroesophageal reflux disease)    H/O hiatal hernia    Hyperlipidemia    Hypertension    Myocardial infarction (HCC)    PONV (postoperative nausea and vomiting)    Sleep apnea    Umbilical hernia     Medications:  Medications Prior to Admission  Medication Sig Dispense Refill Last Dose/Taking   acetaminophen  (TYLENOL ) 650 MG CR tablet Take 1,300 mg by mouth every 8 (eight) hours as needed for pain.   Past Month   albuterol  (VENTOLIN  HFA) 108 (90 Base) MCG/ACT inhaler Inhale 2 puffs into the lungs every 4 (four) hours as needed for shortness of breath or wheezing.   01/24/2024   aspirin  EC 81 MG tablet Take 81 mg by mouth daily.    02/04/2024 Morning   Cholecalciferol (VITAMIN D ) 2000 units tablet Take 2,000 Units by mouth daily.   02/04/2024 Morning  cilostazol  (PLETAL ) 100 MG tablet Take 1 tablet (100 mg total) by mouth 2 (two) times daily. 180 tablet 1 02/04/2024 Morning   Cinnamon 500 MG capsule Take 500 mg by mouth daily.   02/04/2024 Morning   Coenzyme Q10 (COQ10) 100 MG CAPS Take 100 mg by mouth daily.    02/04/2024 Morning   DULoxetine  (CYMBALTA ) 60 MG capsule Take 60 mg by mouth daily.   02/04/2024 Morning   ezetimibe  (ZETIA ) 10 MG tablet Take 1 tablet (10 mg total) by mouth daily. 90 tablet 3 02/04/2024 Morning   fluticasone  (FLONASE ) 50 MCG/ACT nasal spray Place 2 sprays into both nostrils at bedtime as needed for allergies or rhinitis.   02/03/2024   furosemide  (LASIX ) 40 MG tablet Take 1 tablet (40 mg total) by mouth 2 (two) times daily. (Patient taking differently: Take 40 mg by mouth daily.) 180 tablet 2 02/04/2024 Morning   gabapentin  (NEURONTIN ) 800 MG tablet Take 1,600 mg by mouth 3 (three) times daily.   02/04/2024 Morning   hydrocortisone  cream 1 % Apply 1 Application topically daily as needed for itching.   Past Month   insulin  aspart (NOVOLOG ) 100 UNIT/ML injection Inject 1.75 Units into the skin See admin instructions. 1.25 Units 12 Am-7  Am,  1.75 u/hr 7 AM  Til 12 AM, Bolus 1 unit for every 15 carbs, 1 Unit for every 50 points greater than 150 Insulin  pump   02/05/2024   levocetirizine (XYZAL ) 5 MG tablet Take 5 mg by mouth daily.    02/03/2024   metoprolol  succinate (TOPROL -XL) 25 MG 24 hr tablet TAKE 1 TABLET TWICE DAILY 180 tablet 3 02/04/2024 Morning   montelukast (SINGULAIR) 10 MG tablet Take 10 mg by mouth daily.   02/04/2024 Morning   ofloxacin  (OCUFLOX ) 0.3 % ophthalmic solution Place 2 drops into the left eye 3 (three) times daily.   02/04/2024 Morning   omeprazole (PRILOSEC) 40 MG capsule Take 40 mg by mouth daily.   02/04/2024 Morning   rosuvastatin  (CRESTOR ) 40 MG tablet TAKE ONE TABLET BY MOUTH ONE TIME A DAY. STOP LIPITOR  90 tablet 3 02/04/2024 Morning   Turmeric 500 MG TABS Take 1 tablet by mouth daily.   02/04/2024 Morning   Scheduled:   aspirin  EC  81 mg Oral Daily   cilostazol   100 mg Oral BID   DULoxetine   60 mg Oral Daily   ezetimibe   10 mg Oral Daily   gabapentin   800 mg Oral TID   insulin  aspart  0-15 Units Subcutaneous TID WC   insulin  aspart  5 Units Subcutaneous TID WC   insulin  glargine-yfgn  15 Units Subcutaneous BID   loratadine   10 mg Oral Daily   metoprolol  succinate  50 mg Oral Daily   pantoprazole   40 mg Oral Daily   rosuvastatin   40 mg Oral Daily   Infusions:   sodium chloride  10 mL/hr at 02/06/24 9360   heparin  1,200 Units/hr (02/06/24 0639)   PRN: alum & mag hydroxide-simeth, ipratropium-albuterol   Assessment: 73yo female c/o CP associated w/ SOB and upper back pain, troponin found to be elevated >> to begin heparin ; of note pt had some bloody sputum in ED, Hgb down from baseline (12.4 in April), will aim for low heparin  level and avoid boluses.   -heparin  level= 0.29 on 1200 units/hr -Hg 8.7 -plans noted for cath 7/10  Goal of Therapy:  Heparin  level 0.3-0.5 units/ml Monitor platelets by anticoagulation protocol: Yes   Plan:  Increase heparin  infusion to 1100 units/hr Heparin  level  and  CBC in am  Prentice Poisson, PharmD Clinical Pharmacist **Pharmacist phone directory can now be found on [email or web address] ([email or web address]).  Listed under [facility].

## 2024-02-06 NOTE — Progress Notes (Signed)
 When the patient arrived on the unit she complained of chest pain and abdominal discomfort. On call physician ordered a one time IV Fentanyl  12.5 mcg, an EKG and STAT Trop. After the Fentanyl  was given the patient started to look and feel better. About an hour later, though, she complained of feeling hot and was a little clammy. The nurse checked her blood glucose levels, which were 513.  When the on call physician was informed, the ordered a one time Novolog  Insulin  5 Units.  Will recheck the glucose level in an hour.  Kristene Sotero BRAVO, RN

## 2024-02-06 NOTE — Progress Notes (Signed)
 PROGRESS NOTE    Kiara [name]  FMW:[id number] DOB: [birth year] DOA: 02/04/2024 PCP: Lawrance Handing, MD     Brief Narrative:  Kiara [name] is a 73 y.o. female with medical history significant of HFpEF, CAD s/p CABG in 2014, CKD3, HTN, HLD, and DM2 p/w cp and found to have elevated troponin and BNP c/f ACS.    Pt states that she was in her usual state of health until 0800 when she awoke from her sleep w/ acute onset SOB. She endorses intermittent CP lasting 5-63min over the past few days that worsens with activities like walking/talking and improves with rest. She reports following a strict low sodium diet and denies any recent weight gain or LE edema to accompany her SOB.    In the ED, pt tachycardic and tachypneic on 4L [place]. Labs notable for K 3.2, Cr 1.65, BNP 1264, troponin 2778-->2969, and WBC 15.9. CTA chest neg for PE, but did show b/l pleural effusions. Pt started on hep gtt for ACS, cards consulted per EDP, and pt admitted to medicine for ongoing w/u.  New events last 24 hours / Subjective: She states that she got up to the bedside commode and became short of breath.  Also with chest pressure, abdominal bloating and wheezing.  Assessment & Plan:   Principal Problem:   NSTEMI (non-ST elevated myocardial infarction) (HCC) Active Problems:   Essential hypertension   Anxiety   HLD (hyperlipidemia)   Acute respiratory failure with hypoxia (HCC)   Acute on chronic combined systolic and diastolic CHF (congestive heart failure) (HCC)   ACS (acute coronary syndrome) (HCC)   Uncontrolled type 2 diabetes mellitus with hyperglycemia, with long-term current use of insulin  (HCC)   AKI (acute kidney injury) (HCC)   CKD stage 3b, GFR 30-44 ml/min (HCC)   NSTEMI - Appreciate cardiology, planning for heart cath tomorrow 7/10.  Discussed with Dr. Wonda - IV heparin  drip, aspirin , Plavix   Acute on chronic systolic and diastolic heart failure - EF 30% - Toprol .  Holding diuresis in  setting of AKI  Acute hypoxemic respiratory failure - Desatted down to as low as 66% on room air.  Currently on 4 L nasal cannula O2  AKI on CKD stage IIIb - Baseline creatinine 1.7 - Holding diuretic for now, also in setting of contrast use  Hyperlipidemia - Crestor   Diabetes mellitus type 2, uncontrolled with hyperglycemia - Hemoglobin A1c 10.6 in April 2025 - Semglee , NovoLog , sliding scale insulin  -doses adjusted today    DVT prophylaxis: IV heparin  Code Status: Full code Family Communication: At bedside Disposition Plan: Pending heart cath tomorrow Status is: Inpatient Remains inpatient appropriate because: Heart cath tomorrow    Antimicrobials:  Anti-infectives (From admission, onward)    None        Objective: Vitals:   02/06/24 0227 02/06/24 0454 02/06/24 0815 02/06/24 1133  BP: 98/71 (!) 131/58 (!) 151/72 (!) 118/46  Pulse: 90 97 85 77  Resp: (!) 24 (!) 22 (!) 27 20  Temp:  98 F (36.7 C) 98.3 F (36.8 C) 98 F (36.7 C)  TempSrc:  Oral Oral Oral  SpO2: 93% 95% 95% 96%  Weight:      Height:        Intake/Output Summary (Last 24 hours) at 02/06/2024 1456 Last data filed at 02/06/2024 0900 Gross per 24 hour  Intake 535.27 ml  Output 125 ml  Net 410.27 ml   Filed Weights   02/04/24 1929 02/06/24 0223  Weight: 90.7 kg 92.7  kg    Examination:  General exam: Appears calm and comfortable  Respiratory system: Diminished breath sounds bilaterally without wheeze or rhonchi.  On 4 L oxygen without conversational dyspnea or respiratory distress Cardiovascular system: S1 & S2 heard, RRR. No murmurs. No pedal edema. Gastrointestinal system: Abdomen is nondistended, soft and nontender. Normal bowel sounds heard. Central nervous system: Alert and oriented. No focal neurological deficits. Speech clear.  Extremities: Symmetric in appearance  Skin: No rashes, lesions or ulcers on exposed skin  Psychiatry: Judgement and insight appear normal. Mood & affect  appropriate.   Data Reviewed: I have personally reviewed following labs and imaging studies  CBC: Recent Labs  Lab 02/04/24 1959 02/05/24 1042 02/06/24 0316  WBC 11.4* 15.9* 12.9*  HGB 9.6* 9.9* 8.7*  HCT 29.4* 30.1* 26.7*  MCV 92.7 92.3 91.8  PLT 205 189 184   Basic Metabolic Panel: Recent Labs  Lab 02/04/24 1959 02/05/24 1848 02/06/24 0316  NA 135 136 128*  K 3.2* 3.9 4.4  CL 92* 97* 91*  CO2 29 28 24   GLUCOSE 392* 285* 482*  BUN 26* 30* 38*  CREATININE 1.65* 1.95* 2.19*  CALCIUM  8.4* 8.5* 8.2*   GFR: Estimated Creatinine Clearance: 25.8 mL/min (A) (by C-G formula based on SCr of 2.19 mg/dL (H)). Liver Function Tests: No results for input(s): AST, ALT, ALKPHOS, BILITOT, PROT, ALBUMIN  in the last 168 hours. Recent Labs  Lab 02/04/24 1959  LIPASE 22   No results for input(s): AMMONIA in the last 168 hours. Coagulation Profile: No results for input(s): INR, PROTIME in the last 168 hours. Cardiac Enzymes: No results for input(s): CKTOTAL, CKMB, CKMBINDEX, TROPONINI in the last 168 hours. BNP (last 3 results) No results for input(s): PROBNP in the last 8760 hours. HbA1C: No results for input(s): HGBA1C in the last 72 hours. CBG: Recent Labs  Lab 02/05/24 1721 02/05/24 2250 02/06/24 0455 02/06/24 0626 02/06/24 1132  GLUCAP 178* 361* 513* 484* 423*   Lipid Profile: No results for input(s): CHOL, HDL, LDLCALC, TRIG, CHOLHDL, LDLDIRECT in the last 72 hours. Thyroid  Function Tests: No results for input(s): TSH, T4TOTAL, FREET4, T3FREE, THYROIDAB in the last 72 hours. Anemia Panel: No results for input(s): VITAMINB12, FOLATE, FERRITIN, TIBC, IRON, RETICCTPCT in the last 72 hours. Sepsis Labs: No results for input(s): PROCALCITON, LATICACIDVEN in the last 168 hours.  No results found for this or any previous visit (from the past 240 hours).    Radiology Studies: ECHOCARDIOGRAM  COMPLETE Result Date: 02/05/2024    ECHOCARDIOGRAM REPORT   Patient Name:   Kiara [name] Date of Exam: 02/05/2024 Medical Rec #:  [id number]        Height:       63.0 in Accession #:    [id number]       Weight:       200.0 lb Date of Birth:  May 05, 1951        BSA:          1.934 m Patient Age:    72 years         BP:           119/62 mmHg Patient Gender: F                HR:           87 bpm. Exam Location:  Inpatient Procedure: 2D Echo, Cardiac Doppler, Color Doppler and Intracardiac            Opacification Agent (Both Spectral and Color  Flow Doppler were            utilized during procedure). Indications:    Elevated Troponin  History:        Patient has prior history of Echocardiogram examinations. CAD;                 Risk Factors:Hypertension.  Sonographer:    Vella Key Referring Phys: [id number] TAYLOR A PARCELLS IMPRESSIONS  1. Left ventricular ejection fraction, by estimation, is 30%. The left ventricle has severely decreased function. The left ventricle demonstrates global hypokinesis. There is mild left ventricular hypertrophy. Left ventricular diastolic parameters are indeterminate.  2. Right ventricular systolic function was not well visualized. The right ventricular size is normal.  3. The mitral valve is grossly normal. Mild mitral valve regurgitation. The mean mitral valve gradient is 4.0 mmHg.  4. The aortic valve is tricuspid. Aortic valve regurgitation is not visualized. No aortic stenosis is present.  5. The inferior vena cava is normal in size with <50% respiratory variability, suggesting right atrial pressure of 8 mmHg. Comparison(s): Prior images reviewed side by side. Function has decreased from 2020; this study uses contrast. FINDINGS  Left Ventricle: Calcified chords. Left ventricular ejection fraction, by estimation, is 30%. The left ventricle has severely decreased function. The left ventricle demonstrates global hypokinesis. The left ventricular internal cavity size was normal in size.  There is mild left ventricular hypertrophy. Left ventricular diastolic parameters are indeterminate. Right Ventricle: The right ventricular size is normal. No increase in right ventricular wall thickness. Right ventricular systolic function was not well visualized. Left Atrium: Left atrial size was normal in size. Right Atrium: Right atrial size was normal in size. Pericardium: There is no evidence of pericardial effusion. Mitral Valve: The mitral valve is grossly normal. Mild mitral valve regurgitation. MV peak gradient, 7.9 mmHg. The mean mitral valve gradient is 4.0 mmHg. Tricuspid Valve: The tricuspid valve is normal in structure. Tricuspid valve regurgitation is mild . No evidence of tricuspid stenosis. Aortic Valve: The aortic valve is tricuspid. Aortic valve regurgitation is not visualized. No aortic stenosis is present. Pulmonic Valve: The pulmonic valve was grossly normal. Pulmonic valve regurgitation is mild to moderate. Aorta: The aortic root and ascending aorta are structurally normal, with no evidence of dilitation. Venous: The inferior vena cava is normal in size with less than 50% respiratory variability, suggesting right atrial pressure of 8 mmHg. IAS/Shunts: The atrial septum is grossly normal.  LEFT VENTRICLE PLAX 2D LVIDd:         4.10 cm      Diastology LVIDs:         2.80 cm      LV e' medial:    4.68 cm/s LV PW:         1.20 cm      LV E/e' medial:  28.4 LV IVS:        1.10 cm      LV e' lateral:   5.33 cm/s LVOT diam:     1.50 cm      LV E/e' lateral: 25.0 LV SV:         28 LV SV Index:   15 LVOT Area:     1.77 cm  LV Volumes (MOD) LV vol d, MOD A2C: 102.0 ml LV vol d, MOD A4C: 100.0 ml LV vol s, MOD A2C: 75.1 ml LV vol s, MOD A4C: 72.5 ml LV SV MOD A2C:     26.9 ml LV SV MOD A4C:  100.0 ml LV SV MOD BP:      29.6 ml RIGHT VENTRICLE RV Basal diam:  3.40 cm RV S prime:     3.92 cm/s TAPSE (M-mode): 0.8 cm LEFT ATRIUM             Index        RIGHT ATRIUM           Index LA diam:        4.30  cm 2.22 cm/m   RA Area:     13.70 cm LA Vol (A2C):   38.1 ml 19.70 ml/m  RA Volume:   31.10 ml  16.08 ml/m LA Vol (A4C):   39.7 ml 20.53 ml/m LA Biplane Vol: 40.4 ml 20.89 ml/m  AORTIC VALVE LVOT Vmax:   72.40 cm/s LVOT Vmean:  53.000 cm/s LVOT VTI:    0.161 m  AORTA Ao Root diam: 3.00 cm Ao Asc diam:  2.90 cm MITRAL VALVE                TRICUSPID VALVE MV Area (PHT): 3.89 cm     TV Peak grad:   27.9 mmHg MV Area VTI:   0.93 cm     TV Vmax:        2.64 m/s MV Peak grad:  7.9 mmHg MV Mean grad:  4.0 mmHg     SHUNTS MV Vmax:       1.40 m/s     Systemic VTI:  0.16 m MV Vmean:      94.8 cm/s    Systemic Diam: 1.50 cm MV Decel Time: 195 msec MV E velocity: 133.00 cm/s MV A velocity: 103.00 cm/s MV E/A ratio:  1.29 Stanly Leavens MD Electronically signed by Stanly Leavens MD Signature Date/Time: 02/05/2024/4:43:25 PM    Final    CT Angio Chest Pulmonary Embolism (PE) W or WO Contrast Result Date: 02/04/2024 CLINICAL DATA:  Acute aortic syndrome suspected. Shortness of breath and back pain. Intermittent chest pain. EXAM: CT ANGIOGRAPHY CHEST WITH CONTRAST TECHNIQUE: Multidetector CT imaging of the chest was performed using the standard protocol during bolus administration of intravenous contrast. Multiplanar CT image reconstructions and MIPs were obtained to evaluate the vascular anatomy. RADIATION DOSE REDUCTION: This exam was performed according to the departmental dose-optimization program which includes automated exposure control, adjustment of the mA and/or kV according to patient size and/or use of iterative reconstruction technique. CONTRAST:  75mL OMNIPAQUE  IOHEXOL  350 MG/ML SOLN COMPARISON:  Chest radiograph 02/04/2024 FINDINGS: Cardiovascular: Unenhanced images of the chest demonstrate calcification of the aorta. No acute intramural hematoma. Postoperative changes with sternotomy wires and surgical clips in the mediastinum consistent with coronary bypass. Images obtained after contrast  material administration demonstrate normal caliber thoracic aorta. No aortic dissection. Great vessel origins are patent. Mild cardiac enlargement. No pericardial effusions. Central pulmonary arteries are well opacified. No evidence of significant pulmonary embolus. Mediastinum/Nodes: Esophagus is decompressed. Thyroid  gland is unremarkable. Prominent mediastinal lymph nodes with largest left aortopulmonic window nodes measuring up to 1.5 cm in diameter. Appearance is nonspecific. Lungs/Pleura: Small bilateral pleural effusions. Atelectasis or consolidation in the lung bases. Diffuse thickening of interlobular septa likely representing edema. Upper Abdomen: No acute abnormalities demonstrated. Musculoskeletal: Sternotomy wires. Degenerative changes in the spine. No acute bony abnormalities. Review of the MIP images confirms the above findings. IMPRESSION: 1. Aortic atherosclerosis. No evidence of aortic aneurysm or dissection. 2. No evidence of significant pulmonary embolus. 3. Nonspecific lymphadenopathy in the mediastinum. 4. Bilateral pleural effusions with basilar atelectasis  or consolidation. 5. Diffuse interstitial thickening suggesting pulmonary edema. Electronically Signed   By: Elsie Gravely M.D.   On: 02/04/2024 23:59   DG Chest 2 View Result Date: 02/04/2024 CLINICAL DATA:  Shortness of breath EXAM: CHEST - 2 VIEW COMPARISON:  Chest radiograph August 04, 2017 FINDINGS: The heart size and mediastinal contours are within it borderline normal limits. Blunting of bilateral costophrenic angles left greater than right suggestive of mild pleural effusion/atelectasis, improved to prior exam from 2019. No suspicious new consolidation or pulmonary nodule. Status post median sternotomy. The visualized skeletal structures are unremarkable. IMPRESSION: Suggestion of bilateral small pleural effusions. Otherwise no active cardiopulmonary disease. Status post CABG. Electronically Signed   By: Megan  Zare M.D.   On:  02/04/2024 20:01      Scheduled Meds:  aspirin  EC  81 mg Oral Daily   clopidogrel   300 mg Oral Once   [START ON 02/07/2024] clopidogrel   75 mg Oral Daily   DULoxetine   60 mg Oral Daily   ezetimibe   10 mg Oral Daily   gabapentin   800 mg Oral TID   insulin  aspart  0-15 Units Subcutaneous TID WC   insulin  aspart  5 Units Subcutaneous TID WC   insulin  glargine-yfgn  15 Units Subcutaneous BID   loratadine   10 mg Oral Daily   metoprolol  succinate  50 mg Oral Daily   pantoprazole   40 mg Oral Daily   rosuvastatin   40 mg Oral Daily   Continuous Infusions:  sodium chloride  10 mL/hr at 02/06/24 9360   heparin  1,300 Units/hr (02/06/24 1002)     LOS: 1 day   Time spent: 40 minutes   Delon Hoe, DO [facility] 02/06/2024, 2:56 PM   Available via Epic secure chat 7am-7pm After these hours, please refer to coverage provider listed on [email or web address]

## 2024-02-06 NOTE — Plan of Care (Signed)
  Problem: Nutritional: Goal: Progress toward achieving an optimal weight will improve Outcome: Not Progressing

## 2024-02-06 NOTE — Progress Notes (Addendum)
  Progress Note  Patient Name: Kiara [name] Date of Encounter: 02/06/2024 [facility] Cardiologist: Vishnu P Mallipeddi, MD   Interval Summary   No recurrent CP. Dyspnea improved but still symptomatic with any activity.   Vital Signs Vitals:   02/06/24 0223 02/06/24 0227 02/06/24 0454 02/06/24 0815  BP: (!) 134/21 98/71 (!) 131/58 (!) 151/72  Pulse: 92 90 97 85  Resp: (!) 25 (!) 24 (!) 22 (!) 27  Temp: 98.5 F (36.9 C)  98 F (36.7 C) 98.3 F (36.8 C)  TempSrc: Oral  Oral Oral  SpO2: 93% 93% 95% 95%  Weight: 92.7 kg     Height: 5' 3 (1.6 m)       Intake/Output Summary (Last 24 hours) at 02/06/2024 1020 Last data filed at 02/06/2024 0900 Gross per 24 hour  Intake 535.27 ml  Output 125 ml  Net 410.27 ml      02/06/2024    2:23 AM 02/04/2024    7:29 PM 12/25/2023   10:01 AM  Last 3 Weights  Weight (lbs) 204 lb 5.9 oz 200 lb 199 lb 9.6 oz  Weight (kg) 92.7 kg 90.719 kg 90.538 kg      Telemetry/ECG  Sinus rhythm - Personally Reviewed  Physical Exam  GEN: No acute distress.   Neck: JVP moderately elevated Cardiac: RRR, no murmurs, rubs, or gallops.  Respiratory: Clear to auscultation bilaterally. GI: Soft, nontender, non-distended  MS: No edema  Assessment & Plan  NSTEMI: no recurrent ischemic symptoms. Continue IV heparin . Needs R/L heart cath possible PCI. However, signs of AKI with diuresis and now 48 hours post-contrast administration with CTA chest. Hold cath today. Hold diuretics. Reassess creatinine tomorrow. Likely cath tomorrow. Load with clopidogrel  300 mg today and start 75 mg daily. Can change to prasugrel pending cath evaluation if needed. Will DC cilostazol . Otherwise continue current Rx.  Acute on chronic HFrEF: LVEF declined to 30% this admission. Continue metoprolol  succinate. HOLD lasix . Avoid ACE/ARB in setting AKI.  AKI on CKD 3. Creatinine trended up to 2.1 today. Hold diuretic. Repeat in am. Likely reflective of diuresis and CTA study now  48 hours ago.  Acute on chronic anemia: suspect multifactorial (CKD, phlebotomy). Anemia is normocytic no signs of active bleed.   Disposition: Hold diuretic therapy, repeat metabolic panel tomorrow morning, if creatinine stable/trending down, anticipate right and left heart catheterization with limited contrast tomorrow.    For questions or updates, please contact [facility] Please consult [email or web address] for contact info under       Signed, Ozell Fell, MD

## 2024-02-06 NOTE — Plan of Care (Signed)
   Problem: Education: Goal: Ability to describe self-care measures that may prevent or decrease complications (Diabetes Survival Skills Education) will improve Outcome: Progressing Goal: Individualized Educational Video(s) Outcome: Progressing   Problem: Coping: Goal: Ability to adjust to condition or change in health will improve Outcome: Progressing

## 2024-02-06 NOTE — Progress Notes (Signed)
 TRH night cross cover note:   Per pt's request, I have resumed her home evening Flonase .      Eva Pore, DO Hospitalist

## 2024-02-06 NOTE — Progress Notes (Addendum)
 PHARMACY - ANTICOAGULATION CONSULT NOTE  Pharmacy Consult for heparin  Indication: NSTEMI  Labs: Recent Labs    02/04/24 1959 02/04/24 2318 02/05/24 1042 02/05/24 1130 02/05/24 1848 02/05/24 2324  HGB 9.6*  --  9.9*  --   --   --   HCT 29.4*  --  30.1*  --   --   --   PLT 205  --  189  --   --   --   APTT  --   --   --  70*  --   --   HEPARINUNFRC  --   --   --  0.19*  --  0.17*  CREATININE 1.65*  --   --   --  1.95*  --   TROPONINIHS 2,778* 2,969*  --   --   --   --    Assessment: 72yo female remasibs subtherapeutic on heparin  after rate change; no infusion issues or signs of bleeding per RN.  Goal of Therapy:  Heparin  level 0.3-0.5 units/ml   Plan:  Increase heparin  infusion by 2-3 units/kg/hr to 1200 units/hr. Check level in 8 hours.   Kiara [name], PharmD, BCPS 02/06/2024 12:21 AM

## 2024-02-06 NOTE — Progress Notes (Signed)
 The patient would only put out small amounts of urine when she voided. Each time, the urine had a very strong smell to it.  It's possible that the patient has a UTI.  The nurse informed the on call physician, who ordered a clean catch urinalysis to be collected.  Will continue to monitor.  Kristene Sotero BRAVO, RN

## 2024-02-07 DIAGNOSIS — I5043 Acute on chronic combined systolic (congestive) and diastolic (congestive) heart failure: Secondary | ICD-10-CM | POA: Diagnosis not present

## 2024-02-07 DIAGNOSIS — I214 Non-ST elevation (NSTEMI) myocardial infarction: Secondary | ICD-10-CM | POA: Diagnosis not present

## 2024-02-07 LAB — LIPID PANEL
Cholesterol: 77 mg/dL (ref 0–200)
HDL: 29 mg/dL — ABNORMAL LOW (ref >40)
LDL Cholesterol: 28 mg/dL (ref 0–99)
Total CHOL/HDL Ratio: 2.7 ratio
Triglycerides: 102 mg/dL (ref <150)
VLDL: 20 mg/dL (ref 0–40)

## 2024-02-07 LAB — CBC
HCT: 26.3 % — ABNORMAL LOW (ref 36.0–46.0)
Hemoglobin: 8.7 g/dL — ABNORMAL LOW (ref 12.0–15.0)
MCH: 29.8 pg (ref 26.0–34.0)
MCHC: 33.1 g/dL (ref 30.0–36.0)
MCV: 90.1 fL (ref 80.0–100.0)
Platelets: 237 K/uL (ref 150–400)
RBC: 2.92 MIL/uL — ABNORMAL LOW (ref 3.87–5.11)
RDW: 13.9 % (ref 11.5–15.5)
WBC: 13.1 K/uL — ABNORMAL HIGH (ref 4.0–10.5)
nRBC: 0 % (ref 0.0–0.2)

## 2024-02-07 LAB — BASIC METABOLIC PANEL WITH GFR
Anion gap: 12 (ref 5–15)
BUN: 55 mg/dL — ABNORMAL HIGH (ref 8–23)
CO2: 24 mmol/L (ref 22–32)
Calcium: 8.3 mg/dL — ABNORMAL LOW (ref 8.9–10.3)
Chloride: 93 mmol/L — ABNORMAL LOW (ref 98–111)
Creatinine, Ser: 2.42 mg/dL — ABNORMAL HIGH (ref 0.44–1.00)
GFR, Estimated: 21 mL/min — ABNORMAL LOW (ref >60)
Glucose, Bld: 118 mg/dL — ABNORMAL HIGH (ref 70–99)
Potassium: 3.5 mmol/L (ref 3.5–5.1)
Sodium: 129 mmol/L — ABNORMAL LOW (ref 135–145)

## 2024-02-07 LAB — GLUCOSE, CAPILLARY
Glucose-Capillary: 105 mg/dL — ABNORMAL HIGH (ref 70–99)
Glucose-Capillary: 168 mg/dL — ABNORMAL HIGH (ref 70–99)
Glucose-Capillary: 211 mg/dL — ABNORMAL HIGH (ref 70–99)
Glucose-Capillary: 302 mg/dL — ABNORMAL HIGH (ref 70–99)
Glucose-Capillary: 272 mg/dL — ABNORMAL HIGH (ref 70–99)

## 2024-02-07 LAB — HEPARIN LEVEL (UNFRACTIONATED): Heparin Unfractionated: 0.32 [IU]/mL (ref 0.30–0.70)

## 2024-02-07 MED ORDER — GABAPENTIN 300 MG PO CAPS
300.0000 mg | ORAL_CAPSULE | Freq: Two times a day (BID) | ORAL | Status: DC
  Administered 2024-02-07 – 2024-02-15 (×15): 300 mg via ORAL
  Filled 2024-02-07 (×15): qty 1

## 2024-02-07 MED ORDER — ATORVASTATIN CALCIUM 80 MG PO TABS
80.0000 mg | ORAL_TABLET | Freq: Every day | ORAL | Status: DC
  Administered 2024-02-09 – 2024-02-15 (×7): 80 mg via ORAL
  Filled 2024-02-07 (×7): qty 1

## 2024-02-07 MED ORDER — FENTANYL CITRATE PF 50 MCG/ML IJ SOSY
12.5000 ug | PREFILLED_SYRINGE | INTRAMUSCULAR | Status: DC | PRN
  Administered 2024-02-07 (×2): 12.5 ug via INTRAVENOUS
  Filled 2024-02-07 (×2): qty 1

## 2024-02-07 MED ORDER — CLOPIDOGREL BISULFATE 75 MG PO TABS
225.0000 mg | ORAL_TABLET | Freq: Every day | ORAL | Status: DC
  Administered 2024-02-07: 225 mg via ORAL
  Filled 2024-02-07: qty 3

## 2024-02-07 NOTE — Progress Notes (Signed)
  Progress Note  Patient Name: Kiara [name] Date of Encounter: 02/07/2024 [facility] Cardiologist: Vishnu P Mallipeddi, MD   Interval Summary   Stable symptoms of dyspnea today.  No clinical change.  Denies chest pain.  Remains on IV heparin .  Vital Signs Vitals:   02/07/24 0444 02/07/24 0742 02/07/24 0953 02/07/24 1023  BP: (!) 113/51 (!) 100/46  (!) 138/53  Pulse: 82 83  88  Resp: 19 20    Temp: 98.3 F (36.8 C) 98.3 F (36.8 C)    TempSrc: Oral Oral    SpO2: 96% 95% 95%   Weight:      Height:        Intake/Output Summary (Last 24 hours) at 02/07/2024 1026 Last data filed at 02/06/2024 2349 Gross per 24 hour  Intake 382.88 ml  Output --  Net 382.88 ml      02/06/2024    2:23 AM 02/04/2024    7:29 PM 12/25/2023   10:01 AM  Last 3 Weights  Weight (lbs) 204 lb 5.9 oz 200 lb 199 lb 9.6 oz  Weight (kg) 92.7 kg 90.719 kg 90.538 kg      Telemetry/ECG  Sinus rhythm with occasional PVCs- Personally Reviewed  Physical Exam  GEN: No acute distress.   Neck: JVP appears moderately elevated Cardiac: RRR, no murmurs, rubs, or gallops.  Respiratory: Clear to auscultation bilaterally. GI: Soft, nontender, non-distended  MS: No edema  Assessment & Plan  NSTEMI: no recurrent ischemic symptoms. Continue IV heparin . Needs R/L heart cath possible PCI.  Patient loaded with clopidogrel  yesterday.  Cilostazol  discontinued.  Unfortunately her creatinine continues to trend up, likely a combination of heart failure and contrast nephropathy from CTA.  Plan repeat metabolic panel tomorrow morning.  If her creatinine continues to trend upward, I would plan to perform a right heart catheterization alone to better understand her hemodynamics and volume status.  Her for creatinine has plateaued and/or is decreasing, would anticipate right and left heart catheterization with limited contrast.  Reviewed my plans with the patient and her son who is present at the bedside.  Will order a diet  today and make her n.p.o. at midnight for either right heart catheterization alone or left and right heart catheterization tomorrow pending review of her renal function. Acute on chronic HFrEF: LVEF declined to 30% this admission. Continue metoprolol  succinate. HOLD lasix . Avoid ACE/ARB in setting AKI.  GDMT limited due to her AKI. AKI on CKD 3. Creatinine has trended up further to 2.42 today.  Medications reviewed and she is on no nephrotoxic drugs.  Hold diuretic today.  Right heart catheterization tomorrow regardless of renal function. Acute on chronic anemia: suspect multifactorial (CKD, phlebotomy). Anemia is normocytic no signs of active bleed.  Hemoglobin stable today at 8.7.     For questions or updates, please contact [facility] Please consult [email or web address] for contact info under       Signed, Ozell Fell, MD

## 2024-02-07 NOTE — TOC CM/SW Note (Signed)
 Transition of Care [facility]) - Inpatient Brief Assessment   Patient Details  Name: Kiara [name] MRN: [id number] Date of Birth: 09/18/1950  Transition of Care [facility]) CM/SW Contact:    Tom-Johnson, Harvest Muskrat, RN Phone Number: 02/07/2024, 2:47 PM   Clinical Narrative:  Patient presented to the ED with Shortness Of Breath, worsening intermittent Chest pain lasting 5-10 min, Back pain radiating to her Shoulder Blades. Patient has hx of MI, HFrEF, CAD s/p CABG, CKD3, HTN, HLD and T2DM.   Patient is admitted with NSTEMI. Cardiology following, planning for Heart Cath tomorrow 02/08/24. On IV Heparin  gtt, Aspirin , Plavix .  From home alone, has two supportive sons. Independent with care and drive self. Has a travel w/c at home. Currently on 4L O2, does not use home O2.  PCP is Lawrance Handing, MD and uses [facility] on [facility] in [place].   Patient not Medically ready for discharge.  CM will continue to follow for discharge needs as patient progresses with care towards discharge.               Transition of Care Asessment: Insurance and Status: Insurance coverage has been reviewed Patient has primary care physician: Yes Home environment has been reviewed: Yes Prior level of function:: Modified Independent Prior/Current Home Services: No current home services Social Drivers of Health Review: SDOH reviewed no interventions necessary Readmission risk has been reviewed: Yes Transition of care needs: no transition of care needs at this time

## 2024-02-07 NOTE — Progress Notes (Signed)
 PROGRESS NOTE    Kiara [name]  FMW:[id number] DOB: 08-25-1950 DOA: 02/04/2024 PCP: Kiara Handing, MD     Brief Narrative:  Kiara [name] is a 73 y.o. female with medical history significant of HFpEF, CAD s/p CABG in 2014, CKD3, HTN, HLD, and DM2 p/w cp and found to have elevated troponin and BNP c/f ACS.    Pt states that she was in her usual state of health until 0800 when she awoke from her sleep w/ acute onset SOB. She endorses intermittent CP lasting 5-34min over the past few days that worsens with activities like walking/talking and improves with rest. She reports following a strict low sodium diet and denies any recent weight gain or LE edema to accompany her SOB.    In the ED, pt tachycardic and tachypneic on 4L [place]. Labs notable for K 3.2, Cr 1.65, BNP 1264, troponin 2778-->2969, and WBC 15.9. CTA chest neg for PE, but did show b/l pleural effusions. Pt started on hep gtt for ACS, cards consulted per EDP, and pt admitted to medicine for ongoing w/u.  New events last 24 hours / Subjective: Still with some SOB. Denies any dysuria or pelvic discomfort, afebrile.   Assessment & Plan:   Principal Problem:   NSTEMI (non-ST elevated myocardial infarction) (HCC) Active Problems:   Essential hypertension   Anxiety   HLD (hyperlipidemia)   Acute respiratory failure with hypoxia (HCC)   Acute on chronic combined systolic and diastolic CHF (congestive heart failure) (HCC)   ACS (acute coronary syndrome) (HCC)   Uncontrolled type 2 diabetes mellitus with hyperglycemia, with long-term current use of insulin  (HCC)   AKI (acute kidney injury) (HCC)   CKD stage 3b, GFR 30-44 ml/min (HCC)   NSTEMI - Appreciate cardiology, planning for heart cath tomorrow 7/11 - IV heparin  drip, aspirin , Plavix   Acute on chronic systolic and diastolic heart failure - EF 30% - Toprol .  Holding diuresis in setting of AKI  Acute hypoxemic respiratory failure - Desatted down to as low as 66% on room  air.  Currently on 4 L nasal cannula O2  AKI on CKD stage IIIb - Baseline creatinine 1.7 - Holding diuretic for now, also in setting of contrast use  Hyperlipidemia - Crestor   Diabetes mellitus type 2, uncontrolled with hyperglycemia - Hemoglobin A1c 10.6 in April 2025 - Semglee , NovoLog , sliding scale insulin      DVT prophylaxis: IV heparin  Code Status: Full code Family Communication: At bedside Disposition Plan: Pending heart cath tomorrow Status is: Inpatient Remains inpatient appropriate because: Heart cath tomorrow    Antimicrobials:  Anti-infectives (From admission, onward)    None        Objective: Vitals:   02/07/24 0742 02/07/24 0953 02/07/24 1023 02/07/24 1127  BP: (!) 100/46  (!) 138/53 (!) 147/100  Pulse: 83  88 92  Resp: 20   17  Temp: 98.3 F (36.8 C)   98.3 F (36.8 C)  TempSrc: Oral   Oral  SpO2: 95% 95%  95%  Weight:      Height:        Intake/Output Summary (Last 24 hours) at 02/07/2024 1238 Last data filed at 02/06/2024 2349 Gross per 24 hour  Intake 382.88 ml  Output --  Net 382.88 ml   Filed Weights   02/04/24 1929 02/06/24 0223  Weight: 90.7 kg 92.7 kg    Examination:  General exam: Appears calm and comfortable  Respiratory system: Diminished breath sounds bilaterally without wheeze or rhonchi.  On 4  L oxygen without conversational dyspnea or respiratory distress Cardiovascular system: S1 & S2 heard, RRR. No murmurs. No pedal edema. Gastrointestinal system: Abdomen is nondistended, soft and nontender. Normal bowel sounds heard. Central nervous system: Alert and oriented. No focal neurological deficits. Speech clear.  Extremities: Symmetric in appearance  Skin: No rashes, lesions or ulcers on exposed skin  Psychiatry: Judgement and insight appear normal. Mood & affect appropriate.   Data Reviewed: I have personally reviewed following labs and imaging studies  CBC: Recent Labs  Lab 02/04/24 1959 02/05/24 1042 02/06/24 0316  02/07/24 0325  WBC 11.4* 15.9* 12.9* 13.1*  HGB 9.6* 9.9* 8.7* 8.7*  HCT 29.4* 30.1* 26.7* 26.3*  MCV 92.7 92.3 91.8 90.1  PLT 205 189 184 237   Basic Metabolic Panel: Recent Labs  Lab 02/04/24 1959 02/05/24 1848 02/06/24 0316 02/07/24 0325  NA 135 136 128* 129*  K 3.2* 3.9 4.4 3.5  CL 92* 97* 91* 93*  CO2 29 28 24 24   GLUCOSE 392* 285* 482* 118*  BUN 26* 30* 38* 55*  CREATININE 1.65* 1.95* 2.19* 2.42*  CALCIUM  8.4* 8.5* 8.2* 8.3*   GFR: Estimated Creatinine Clearance: 23.4 mL/min (A) (by C-G formula based on SCr of 2.42 mg/dL (H)). Liver Function Tests: No results for input(s): AST, ALT, ALKPHOS, BILITOT, PROT, ALBUMIN  in the last 168 hours. Recent Labs  Lab 02/04/24 1959  LIPASE 22   No results for input(s): AMMONIA in the last 168 hours. Coagulation Profile: No results for input(s): INR, PROTIME in the last 168 hours. Cardiac Enzymes: No results for input(s): CKTOTAL, CKMB, CKMBINDEX, TROPONINI in the last 168 hours. BNP (last 3 results) No results for input(s): PROBNP in the last 8760 hours. HbA1C: No results for input(s): HGBA1C in the last 72 hours. CBG: Recent Labs  Lab 02/06/24 1648 02/06/24 2104 02/07/24 0630 02/07/24 1043 02/07/24 1126  GLUCAP 431* 259* 105* 168* 211*   Lipid Profile: Recent Labs    02/07/24 0325  CHOL 77  HDL 29*  LDLCALC 28  TRIG 897  CHOLHDL 2.7   Thyroid  Function Tests: No results for input(s): TSH, T4TOTAL, FREET4, T3FREE, THYROIDAB in the last 72 hours. Anemia Panel: No results for input(s): VITAMINB12, FOLATE, FERRITIN, TIBC, IRON, RETICCTPCT in the last 72 hours. Sepsis Labs: No results for input(s): PROCALCITON, LATICACIDVEN in the last 168 hours.  No results found for this or any previous visit (from the past 240 hours).    Radiology Studies: ECHOCARDIOGRAM COMPLETE Result Date: 02/05/2024    ECHOCARDIOGRAM REPORT   Patient Name:   Kiara [name] Date  of Exam: 02/05/2024 Medical Rec #:  [id number]        Height:       63.0 in Accession #:    [id number]       Weight:       200.0 lb Date of Birth:  23-Jun-1951        BSA:          1.934 m Patient Age:    72 years         BP:           119/62 mmHg Patient Gender: F                HR:           87 bpm. Exam Location:  Inpatient Procedure: 2D Echo, Cardiac Doppler, Color Doppler and Intracardiac            Opacification Agent (Both Spectral and Color  Flow Doppler were            utilized during procedure). Indications:    Elevated Troponin  History:        Patient has prior history of Echocardiogram examinations. CAD;                 Risk Factors:Hypertension.  Sonographer:    Vella Key Referring Phys: [id number] TAYLOR A PARCELLS IMPRESSIONS  1. Left ventricular ejection fraction, by estimation, is 30%. The left ventricle has severely decreased function. The left ventricle demonstrates global hypokinesis. There is mild left ventricular hypertrophy. Left ventricular diastolic parameters are indeterminate.  2. Right ventricular systolic function was not well visualized. The right ventricular size is normal.  3. The mitral valve is grossly normal. Mild mitral valve regurgitation. The mean mitral valve gradient is 4.0 mmHg.  4. The aortic valve is tricuspid. Aortic valve regurgitation is not visualized. No aortic stenosis is present.  5. The inferior vena cava is normal in size with <50% respiratory variability, suggesting right atrial pressure of 8 mmHg. Comparison(s): Prior images reviewed side by side. Function has decreased from 2020; this study uses contrast. FINDINGS  Left Ventricle: Calcified chords. Left ventricular ejection fraction, by estimation, is 30%. The left ventricle has severely decreased function. The left ventricle demonstrates global hypokinesis. The left ventricular internal cavity size was normal in size. There is mild left ventricular hypertrophy. Left ventricular diastolic parameters are indeterminate.  Right Ventricle: The right ventricular size is normal. No increase in right ventricular wall thickness. Right ventricular systolic function was not well visualized. Left Atrium: Left atrial size was normal in size. Right Atrium: Right atrial size was normal in size. Pericardium: There is no evidence of pericardial effusion. Mitral Valve: The mitral valve is grossly normal. Mild mitral valve regurgitation. MV peak gradient, 7.9 mmHg. The mean mitral valve gradient is 4.0 mmHg. Tricuspid Valve: The tricuspid valve is normal in structure. Tricuspid valve regurgitation is mild . No evidence of tricuspid stenosis. Aortic Valve: The aortic valve is tricuspid. Aortic valve regurgitation is not visualized. No aortic stenosis is present. Pulmonic Valve: The pulmonic valve was grossly normal. Pulmonic valve regurgitation is mild to moderate. Aorta: The aortic root and ascending aorta are structurally normal, with no evidence of dilitation. Venous: The inferior vena cava is normal in size with less than 50% respiratory variability, suggesting right atrial pressure of 8 mmHg. IAS/Shunts: The atrial septum is grossly normal.  LEFT VENTRICLE PLAX 2D LVIDd:         4.10 cm      Diastology LVIDs:         2.80 cm      LV e' medial:    4.68 cm/s LV PW:         1.20 cm      LV E/e' medial:  28.4 LV IVS:        1.10 cm      LV e' lateral:   5.33 cm/s LVOT diam:     1.50 cm      LV E/e' lateral: 25.0 LV SV:         28 LV SV Index:   15 LVOT Area:     1.77 cm  LV Volumes (MOD) LV vol d, MOD A2C: 102.0 ml LV vol d, MOD A4C: 100.0 ml LV vol s, MOD A2C: 75.1 ml LV vol s, MOD A4C: 72.5 ml LV SV MOD A2C:     26.9 ml LV SV MOD A4C:  100.0 ml LV SV MOD BP:      29.6 ml RIGHT VENTRICLE RV Basal diam:  3.40 cm RV S prime:     3.92 cm/s TAPSE (M-mode): 0.8 cm LEFT ATRIUM             Index        RIGHT ATRIUM           Index LA diam:        4.30 cm 2.22 cm/m   RA Area:     13.70 cm LA Vol (A2C):   38.1 ml 19.70 ml/m  RA Volume:   31.10 ml   16.08 ml/m LA Vol (A4C):   39.7 ml 20.53 ml/m LA Biplane Vol: 40.4 ml 20.89 ml/m  AORTIC VALVE LVOT Vmax:   72.40 cm/s LVOT Vmean:  53.000 cm/s LVOT VTI:    0.161 m  AORTA Ao Root diam: 3.00 cm Ao Asc diam:  2.90 cm MITRAL VALVE                TRICUSPID VALVE MV Area (PHT): 3.89 cm     TV Peak grad:   27.9 mmHg MV Area VTI:   0.93 cm     TV Vmax:        2.64 m/s MV Peak grad:  7.9 mmHg MV Mean grad:  4.0 mmHg     SHUNTS MV Vmax:       1.40 m/s     Systemic VTI:  0.16 m MV Vmean:      94.8 cm/s    Systemic Diam: 1.50 cm MV Decel Time: 195 msec MV E velocity: 133.00 cm/s MV A velocity: 103.00 cm/s MV E/A ratio:  1.29 Stanly Leavens MD Electronically signed by Stanly Leavens MD Signature Date/Time: 02/05/2024/4:43:25 PM    Final       Scheduled Meds:  aspirin  EC  81 mg Oral Daily   [START ON 02/08/2024] atorvastatin   80 mg Oral Daily   clopidogrel   225 mg Oral Daily   clopidogrel   75 mg Oral Daily   DULoxetine   60 mg Oral Daily   ezetimibe   10 mg Oral Daily   fluticasone   2 spray Each Nare QHS   gabapentin   800 mg Oral TID   insulin  aspart  0-15 Units Subcutaneous TID WC   insulin  aspart  7 Units Subcutaneous TID WC   insulin  glargine-yfgn  18 Units Subcutaneous BID   loratadine   10 mg Oral Daily   metoprolol  succinate  50 mg Oral Daily   pantoprazole   40 mg Oral Daily   Continuous Infusions:  heparin  1,300 Units/hr (02/06/24 2159)     LOS: 2 days   Time spent: 25 minutes   Delon Hoe, DO [facility] 02/07/2024, 12:38 PM   Available via Epic secure chat 7am-7pm After these hours, please refer to coverage provider listed on [email or web address]

## 2024-02-07 NOTE — Plan of Care (Signed)
   Problem: Education: Goal: Ability to describe self-care measures that may prevent or decrease complications (Diabetes Survival Skills Education) will improve Outcome: Progressing Goal: Individualized Educational Video(s) Outcome: Progressing

## 2024-02-07 NOTE — Progress Notes (Signed)
 ANTICOAGULATION CONSULT NOTE  Pharmacy Consult for Heparin  Indication: chest pain/ACS  Allergies  Allergen Reactions   Bactrim [Sulfamethoxazole-Trimethoprim] Rash   Codeine Nausea And Vomiting   Hydromorphone  Nausea Only and Other (See Comments)    Diaphoretic, blood pressure issues, coughing,   Silvadene [Silver Sulfadiazine] Other (See Comments)    Red hot rash and blisters   Adhesive [Tape] Rash    Band aid brand     Mobic [Meloxicam] Swelling   Morphine  And Codeine Nausea Only and Other (See Comments)    Confusion     Patient Measurements: Height: 5' 3 (160 cm) Weight: 92.7 kg (204 lb 5.9 oz) IBW/kg (Calculated) : 52.4 Heparin  Dosing Weight: 73.1 kg  Vital Signs: Temp: 98.3 F (36.8 C) (07/10 0742) Temp Source: Oral (07/10 0742) BP: 138/53 (07/10 1023) Pulse Rate: 88 (07/10 1023)  Labs: Recent Labs    02/04/24 1959 02/04/24 1959 02/04/24 2318 02/05/24 1042 02/05/24 1130 02/05/24 1848 02/05/24 2324 02/06/24 0316 02/06/24 0754 02/07/24 0325  HGB 9.6*  --   --  9.9*  --   --   --  8.7*  --  8.7*  HCT 29.4*  --   --  30.1*  --   --   --  26.7*  --  26.3*  PLT 205  --   --  189  --   --   --  184  --  237  APTT  --   --   --   --  70*  --   --   --   --   --   HEPARINUNFRC  --    < >  --   --  0.19*  --  0.17*  --  0.29* 0.32  CREATININE 1.65*  --   --   --   --  1.95*  --  2.19*  --  2.42*  TROPONINIHS 2,778*  --  2,969*  --   --   --   --  2,517*  --   --    < > = values in this interval not displayed.    Estimated Creatinine Clearance: 23.4 mL/min (A) (by C-G formula based on SCr of 2.42 mg/dL (H)).   Medical History: Past Medical History:  Diagnosis Date   Anemia    Anxiety    Arthritis    Carotid artery disease (HCC)    a. Last carotid study in 2014 showed 1-39% RICA, 40-59% LICA.   Chronic combined systolic (congestive) and diastolic (congestive) heart failure (HCC)    a. EF 40-45% in 2014. b. EF 30-35% in 07/2017.   CKD (chronic kidney  disease), stage III (HCC)    Coronary artery disease    a. s/p CABG 2014. b. Cath 07/2017 for NSTEMI -> med rx.   Diabetes mellitus (HCC)    Diabetic peripheral neuropathy (HCC)    Eczema    GERD (gastroesophageal reflux disease)    H/O hiatal hernia    Hyperlipidemia    Hypertension    Myocardial infarction (HCC)    PONV (postoperative nausea and vomiting)    Sleep apnea    Umbilical hernia     Medications:  Medications Prior to Admission  Medication Sig Dispense Refill Last Dose/Taking   acetaminophen  (TYLENOL ) 650 MG CR tablet Take 1,300 mg by mouth every 8 (eight) hours as needed for pain.   Past Month   albuterol  (VENTOLIN  HFA) 108 (90 Base) MCG/ACT inhaler Inhale 2 puffs into the lungs every 4 (four) hours as  needed for shortness of breath or wheezing.   01/24/2024   aspirin  EC 81 MG tablet Take 81 mg by mouth daily.    02/04/2024 Morning   Cholecalciferol (VITAMIN D ) 2000 units tablet Take 2,000 Units by mouth daily.   02/04/2024 Morning   cilostazol  (PLETAL ) 100 MG tablet Take 1 tablet (100 mg total) by mouth 2 (two) times daily. 180 tablet 1 02/04/2024 Morning   Cinnamon 500 MG capsule Take 500 mg by mouth daily.   02/04/2024 Morning   Coenzyme Q10 (COQ10) 100 MG CAPS Take 100 mg by mouth daily.    02/04/2024 Morning   DULoxetine  (CYMBALTA ) 60 MG capsule Take 60 mg by mouth daily.   02/04/2024 Morning   ezetimibe  (ZETIA ) 10 MG tablet Take 1 tablet (10 mg total) by mouth daily. 90 tablet 3 02/04/2024 Morning   fluticasone  (FLONASE ) 50 MCG/ACT nasal spray Place 2 sprays into both nostrils at bedtime as needed for allergies or rhinitis.   02/03/2024   furosemide  (LASIX ) 40 MG tablet Take 1 tablet (40 mg total) by mouth 2 (two) times daily. (Patient taking differently: Take 40 mg by mouth daily.) 180 tablet 2 02/04/2024 Morning   gabapentin  (NEURONTIN ) 800 MG tablet Take 1,600 mg by mouth 3 (three) times daily.   02/04/2024 Morning   hydrocortisone  cream 1 % Apply 1 Application topically daily as  needed for itching.   Past Month   insulin  aspart (NOVOLOG ) 100 UNIT/ML injection Inject 1.75 Units into the skin See admin instructions. 1.25 Units 12 Am-7 Am,  1.75 u/hr 7 AM  Til 12 AM, Bolus 1 unit for every 15 carbs, 1 Unit for every 50 points greater than 150 Insulin  pump   02/05/2024   levocetirizine (XYZAL ) 5 MG tablet Take 5 mg by mouth daily.    02/03/2024   metoprolol  succinate (TOPROL -XL) 25 MG 24 hr tablet TAKE 1 TABLET TWICE DAILY 180 tablet 3 02/04/2024 Morning   montelukast (SINGULAIR) 10 MG tablet Take 10 mg by mouth daily.   02/04/2024 Morning   ofloxacin  (OCUFLOX ) 0.3 % ophthalmic solution Place 2 drops into the left eye 3 (three) times daily.   02/04/2024 Morning   omeprazole (PRILOSEC) 40 MG capsule Take 40 mg by mouth daily.   02/04/2024 Morning   rosuvastatin  (CRESTOR ) 40 MG tablet TAKE ONE TABLET BY MOUTH ONE TIME A DAY. STOP LIPITOR  90 tablet 3 02/04/2024 Morning   Turmeric 500 MG TABS Take 1 tablet by mouth daily.   02/04/2024 Morning   Scheduled:   aspirin  EC  81 mg Oral Daily   clopidogrel   300 mg Oral Once   clopidogrel   75 mg Oral Daily   DULoxetine   60 mg Oral Daily   ezetimibe   10 mg Oral Daily   fluticasone   2 spray Each Nare QHS   gabapentin   800 mg Oral TID   insulin  aspart  0-15 Units Subcutaneous TID WC   insulin  aspart  7 Units Subcutaneous TID WC   insulin  glargine-yfgn  18 Units Subcutaneous BID   loratadine   10 mg Oral Daily   metoprolol  succinate  50 mg Oral Daily   pantoprazole   40 mg Oral Daily   rosuvastatin   40 mg Oral Daily   Infusions:   heparin  1,300 Units/hr (02/06/24 2159)   PRN: alum & mag hydroxide-simeth, fentaNYL  (SUBLIMAZE ) injection, ipratropium-albuterol   Assessment: 73yo female c/o CP associated w/ SOB and upper back pain, troponin found to be elevated >> to begin heparin ; of note pt had some bloody sputum in  ED, Hgb down from baseline (12.4 in April), will aim for low heparin  level and avoid boluses.   -heparin  level= 0.32 on 1300  units/hr -Hg 8.7 -plans noted for cath 7/11  Goal of Therapy:  Heparin  level 0.3-0.5 units/ml Monitor platelets by anticoagulation protocol: Yes   Plan:  Continue heparin  infusion 1300 units/hr Heparin  level and CBC in am  Maurilio Patten, PharmD PGY1 Pharmacy Resident [facility]  02/07/2024 11:38 AM

## 2024-02-08 ENCOUNTER — Encounter (HOSPITAL_COMMUNITY)
Admission: EM | Disposition: A | Discharge status: Home Health | Payer: Self-pay | Source: Home / Self Care | Attending: Internal Medicine

## 2024-02-08 ENCOUNTER — Encounter (HOSPITAL_COMMUNITY): Payer: Self-pay | Admitting: Cardiology

## 2024-02-08 ENCOUNTER — Other Ambulatory Visit: Payer: Self-pay

## 2024-02-08 DIAGNOSIS — I2510 Atherosclerotic heart disease of native coronary artery without angina pectoris: Secondary | ICD-10-CM | POA: Diagnosis not present

## 2024-02-08 DIAGNOSIS — I214 Non-ST elevation (NSTEMI) myocardial infarction: Secondary | ICD-10-CM | POA: Diagnosis not present

## 2024-02-08 DIAGNOSIS — I5023 Acute on chronic systolic (congestive) heart failure: Secondary | ICD-10-CM | POA: Diagnosis not present

## 2024-02-08 HISTORY — PX: RIGHT HEART CATH AND CORONARY/GRAFT ANGIOGRAPHY: CATH118265

## 2024-02-08 LAB — CBC
HCT: 27.2 % — ABNORMAL LOW (ref 36.0–46.0)
Hemoglobin: 8.7 g/dL — ABNORMAL LOW (ref 12.0–15.0)
MCH: 29.5 pg (ref 26.0–34.0)
MCHC: 32 g/dL (ref 30.0–36.0)
MCV: 92.2 fL (ref 80.0–100.0)
Platelets: 268 K/uL (ref 150–400)
RBC: 2.95 MIL/uL — ABNORMAL LOW (ref 3.87–5.11)
RDW: 14.1 % (ref 11.5–15.5)
WBC: 12.7 K/uL — ABNORMAL HIGH (ref 4.0–10.5)
nRBC: 0 % (ref 0.0–0.2)

## 2024-02-08 LAB — POCT I-STAT EG7
Acid-Base Excess: 3 mmol/L — ABNORMAL HIGH (ref 0.0–2.0)
Acid-Base Excess: 5 mmol/L — ABNORMAL HIGH (ref 0.0–2.0)
Acid-Base Excess: 4 mmol/L — ABNORMAL HIGH (ref 0.0–2.0)
Bicarbonate: 27.1 mmol/L (ref 20.0–28.0)
Bicarbonate: 29.5 mmol/L — ABNORMAL HIGH (ref 20.0–28.0)
Bicarbonate: 29.1 mmol/L — ABNORMAL HIGH (ref 20.0–28.0)
Calcium, Ion: 1.07 mmol/L — ABNORMAL LOW (ref 1.15–1.40)
Calcium, Ion: 1.1 mmol/L — ABNORMAL LOW (ref 1.15–1.40)
Calcium, Ion: 1.1 mmol/L — ABNORMAL LOW (ref 1.15–1.40)
HCT: 25 % — ABNORMAL LOW (ref 36.0–46.0)
HCT: 26 % — ABNORMAL LOW (ref 36.0–46.0)
HCT: 26 % — ABNORMAL LOW (ref 36.0–46.0)
Hemoglobin: 8.5 g/dL — ABNORMAL LOW (ref 12.0–15.0)
Hemoglobin: 8.8 g/dL — ABNORMAL LOW (ref 12.0–15.0)
Hemoglobin: 8.8 g/dL — ABNORMAL LOW (ref 12.0–15.0)
O2 Saturation: 46 %
O2 Saturation: 93 %
O2 Saturation: 44 %
Potassium: 3.7 mmol/L (ref 3.5–5.1)
Potassium: 3.7 mmol/L (ref 3.5–5.1)
Potassium: 3.7 mmol/L (ref 3.5–5.1)
Sodium: 136 mmol/L (ref 135–145)
Sodium: 136 mmol/L (ref 135–145)
Sodium: 136 mmol/L (ref 135–145)
TCO2: 28 mmol/L (ref 22–32)
TCO2: 31 mmol/L (ref 22–32)
TCO2: 30 mmol/L (ref 22–32)
pCO2, Ven: 36.4 mmHg — ABNORMAL LOW (ref 44–60)
pCO2, Ven: 44.5 mmHg (ref 44–60)
pCO2, Ven: 44.2 mmHg (ref 44–60)
pH, Ven: 7.429 (ref 7.25–7.43)
pH, Ven: 7.48 — ABNORMAL HIGH (ref 7.25–7.43)
pH, Ven: 7.427 (ref 7.25–7.43)
pO2, Ven: 25 mmHg — CL (ref 32–45)
pO2, Ven: 61 mmHg — ABNORMAL HIGH (ref 32–45)
pO2, Ven: 24 mmHg — CL (ref 32–45)

## 2024-02-08 LAB — GLUCOSE, CAPILLARY
Glucose-Capillary: 134 mg/dL — ABNORMAL HIGH (ref 70–99)
Glucose-Capillary: 80 mg/dL (ref 70–99)
Glucose-Capillary: 95 mg/dL (ref 70–99)
Glucose-Capillary: 283 mg/dL — ABNORMAL HIGH (ref 70–99)

## 2024-02-08 LAB — BASIC METABOLIC PANEL WITH GFR
Anion gap: 11 (ref 5–15)
BUN: 57 mg/dL — ABNORMAL HIGH (ref 8–23)
CO2: 28 mmol/L (ref 22–32)
Calcium: 8.5 mg/dL — ABNORMAL LOW (ref 8.9–10.3)
Chloride: 93 mmol/L — ABNORMAL LOW (ref 98–111)
Creatinine, Ser: 2.27 mg/dL — ABNORMAL HIGH (ref 0.44–1.00)
GFR, Estimated: 23 mL/min — ABNORMAL LOW (ref >60)
Glucose, Bld: 124 mg/dL — ABNORMAL HIGH (ref 70–99)
Potassium: 4.2 mmol/L (ref 3.5–5.1)
Sodium: 132 mmol/L — ABNORMAL LOW (ref 135–145)

## 2024-02-08 LAB — HEPARIN LEVEL (UNFRACTIONATED): Heparin Unfractionated: 0.32 [IU]/mL (ref 0.30–0.70)

## 2024-02-08 LAB — POCT ACTIVATED CLOTTING TIME: Activated Clotting Time: 124 s

## 2024-02-08 SURGERY — RIGHT HEART CATH AND CORONARY/GRAFT ANGIOGRAPHY
Anesthesia: LOCAL

## 2024-02-08 MED ORDER — LIDOCAINE HCL (PF) 1 % IJ SOLN
INTRAMUSCULAR | Status: DC | PRN
  Administered 2024-02-08: 15 mL

## 2024-02-08 MED ORDER — SODIUM CHLORIDE 0.9% FLUSH
10.0000 mL | Freq: Two times a day (BID) | INTRAVENOUS | Status: DC
  Administered 2024-02-08 – 2024-02-11 (×7): 10 mL
  Administered 2024-02-12: 20 mL
  Administered 2024-02-12 – 2024-02-13 (×2): 10 mL
  Administered 2024-02-13: 20 mL
  Administered 2024-02-14: 10 mL
  Administered 2024-02-15: 20 mL

## 2024-02-08 MED ORDER — LABETALOL HCL 5 MG/ML IV SOLN: 10.0000 mg | INTRAVENOUS | Status: AC | PRN

## 2024-02-08 MED ORDER — METOPROLOL SUCCINATE ER 25 MG PO TB24: 12.5000 mg | ORAL_TABLET | Freq: Every day | ORAL | Status: DC

## 2024-02-08 MED ORDER — SALINE SPRAY 0.65 % NA SOLN
1.0000 | NASAL | Status: DC | PRN
  Filled 2024-02-08: qty 44

## 2024-02-08 MED ORDER — ACETAMINOPHEN 325 MG PO TABS
650.0000 mg | ORAL_TABLET | ORAL | Status: DC | PRN
  Administered 2024-02-11 – 2024-02-12 (×3): 650 mg via ORAL
  Filled 2024-02-08 (×3): qty 2

## 2024-02-08 MED ORDER — HYDRALAZINE HCL 20 MG/ML IJ SOLN: 10.0000 mg | INTRAMUSCULAR | Status: AC | PRN

## 2024-02-08 MED ORDER — METOPROLOL SUCCINATE ER 25 MG PO TB24
25.0000 mg | ORAL_TABLET | Freq: Every day | ORAL | Status: DC
  Administered 2024-02-09 – 2024-02-10 (×2): 25 mg via ORAL
  Filled 2024-02-08 (×2): qty 1

## 2024-02-08 MED ORDER — FUROSEMIDE 10 MG/ML IJ SOLN
80.0000 mg | Freq: Two times a day (BID) | INTRAMUSCULAR | Status: DC
  Administered 2024-02-08 – 2024-02-10 (×5): 80 mg via INTRAVENOUS
  Filled 2024-02-08 (×5): qty 8

## 2024-02-08 MED ORDER — ONDANSETRON HCL 4 MG/2ML IJ SOLN: 4.0000 mg | Freq: Four times a day (QID) | INTRAMUSCULAR | Status: DC | PRN

## 2024-02-08 MED ORDER — IOHEXOL 350 MG/ML SOLN
INTRAVENOUS | Status: DC | PRN
  Administered 2024-02-08: 20 mL via INTRA_ARTERIAL

## 2024-02-08 MED ORDER — SODIUM CHLORIDE 0.9% FLUSH: 10.0000 mL | INTRAVENOUS | Status: DC | PRN

## 2024-02-08 MED ORDER — CHLORHEXIDINE GLUCONATE CLOTH 2 % EX PADS
6.0000 | MEDICATED_PAD | Freq: Every day | CUTANEOUS | Status: DC
  Administered 2024-02-08 – 2024-02-14 (×7): 6 via TOPICAL

## 2024-02-08 MED ORDER — HEPARIN SODIUM (PORCINE) 5000 UNIT/ML IJ SOLN
5000.0000 [IU] | Freq: Three times a day (TID) | INTRAMUSCULAR | Status: DC
  Administered 2024-02-08 – 2024-02-15 (×19): 5000 [IU] via SUBCUTANEOUS
  Filled 2024-02-08 (×19): qty 1

## 2024-02-08 MED ORDER — MILRINONE LACTATE IN DEXTROSE 20-5 MG/100ML-% IV SOLN
0.1250 ug/kg/min | INTRAVENOUS | Status: DC
  Administered 2024-02-08 – 2024-02-10 (×2): 0.25 ug/kg/min via INTRAVENOUS
  Administered 2024-02-11: 0.125 ug/kg/min via INTRAVENOUS
  Filled 2024-02-08 (×4): qty 100

## 2024-02-08 MED ORDER — LIDOCAINE HCL (PF) 1 % IJ SOLN
INTRAMUSCULAR | Status: AC
  Filled 2024-02-08: qty 30

## 2024-02-08 MED ORDER — HEPARIN (PORCINE) IN NACL 1000-0.9 UT/500ML-% IV SOLN
INTRAVENOUS | Status: DC | PRN
  Administered 2024-02-08: 1000 mL via INTRA_ARTERIAL

## 2024-02-08 SURGICAL SUPPLY — 15 items
CATH INFINITI 5 FR AR1 MOD (CATHETERS) IMPLANT
CATH INFINITI 5 FR IM (CATHETERS) IMPLANT
CATH INFINITI 5FR JL4 (CATHETERS) IMPLANT
CATH SWAN GANZ 7F STRAIGHT (CATHETERS) IMPLANT
ELECT DEFIB PAD ADLT CADENCE (PAD) IMPLANT
GLIDESHEATH SLEND A-KIT 6F 22G (SHEATH) IMPLANT
GUIDEWIRE INQWIRE 1.5J.035X260 (WIRE) IMPLANT
KIT MICROPUNCTURE NIT STIFF (SHEATH) IMPLANT
MAT PREVALON FULL STRYKER (MISCELLANEOUS) IMPLANT
PACK CARDIAC CATHETERIZATION (CUSTOM PROCEDURE TRAY) ×1 IMPLANT
SHEATH PINNACLE 5F 10CM (SHEATH) IMPLANT
SHEATH PINNACLE 7F 10CM (SHEATH) IMPLANT
SHEATH PROBE COVER 6X72 (BAG) IMPLANT
STATION PROTECTION PRESSURIZED (MISCELLANEOUS) IMPLANT
TRANSDUCER W/STOPCOCK (MISCELLANEOUS) IMPLANT

## 2024-02-08 NOTE — Progress Notes (Signed)
 Peripherally Inserted Central Catheter Placement  The IV Nurse has discussed with the patient and/or persons authorized to consent for the patient, the purpose of this procedure and the potential benefits and risks involved with this procedure.  The benefits include less needle sticks, lab draws from the catheter, and the patient may be discharged home with the catheter. Risks include, but not limited to, infection, bleeding, blood clot (thrombus formation), and puncture of an artery; nerve damage and irregular heartbeat and possibility to perform a PICC exchange if needed/ordered by physician.  Alternatives to this procedure were also discussed.  B[employer] Power PICC patient education guide, fact sheet on infection prevention and patient information card has been provided to patient /or left at bedside.  PICC inserted by Charlies Angle, RN   PICC Placement Documentation  PICC Double Lumen 02/08/24 Right Brachial 38 cm 1 cm (Active)  Indication for Insertion or Continuance of Line Vasoactive infusions;Chronic illness with exacerbations (CF, Sickle Cell, etc.) 02/08/24 1512  Exposed Catheter (cm) 1 cm 02/08/24 1512  Site Assessment Clean, Dry, Intact 02/08/24 1512  Lumen #1 Status Flushed;Saline locked;Blood return noted 02/08/24 1512  Lumen #2 Status Flushed;Saline locked;Blood return noted 02/08/24 1512  Dressing Type Transparent;Securing device 02/08/24 1512  Dressing Status Antimicrobial disc/dressing in place;Clean, Dry, Intact 02/08/24 1512  Line Care Connections checked and tightened 02/08/24 1512  Line Adjustment (NICU/IV Team Only) No 02/08/24 1512  Dressing Intervention New dressing;Adhesive placed at insertion site (IV team only) 02/08/24 1512  Dressing Change Due 02/15/24 02/08/24 1512       Jlen Wintle Loraine 02/08/2024, 3:13 PM

## 2024-02-08 NOTE — Plan of Care (Signed)
  Problem: Health Behavior/Discharge Planning: Goal: Ability to manage health-related needs will improve Outcome: Not Progressing   Problem: Metabolic: Goal: Ability to maintain appropriate glucose levels will improve Outcome: Not Progressing

## 2024-02-08 NOTE — Progress Notes (Signed)
 Site area: 66F arterial and 1F venous sheaths right groin Site Prior to Removal:  Level 0 Pressure Applied For: 25 minutes Manual:   yes Patient Status During Pull:  stable Post Pull Site:  Level 0 Post Pull Instructions Given:  yes Post Pull Pulses Present: right dp dopplered Dressing Applied:  gauze and tegaderm Bedrest begins @ 1030 Comments:

## 2024-02-08 NOTE — Progress Notes (Signed)
 Pharmacist Heart Failure Core Measure Documentation  Assessment: Kiara [name] has an EF documented as 30% by ECHO.  Rationale: Heart failure patients with left ventricular systolic dysfunction (LVSD) and an EF < 40% should be prescribed an angiotensin converting enzyme inhibitor (ACEI) or angiotensin receptor blocker (ARB) at discharge unless a contraindication is documented in the medical record.  This patient is not currently on an ACEI or ARB for HF.  This note is being placed in the record in order to provide documentation that a contraindication to the use of these agents is present for this encounter.  ACE Inhibitor or Angiotensin Receptor Blocker is contraindicated (specify all that apply)  []   ACEI allergy AND ARB allergy []   Angioedema []   Moderate or severe aortic stenosis []   Hyperkalemia []   Hypotension []   Renal artery stenosis [x]   Worsening renal function, preexisting renal disease or dysfunction   Maurilio LITTIE Patten, PharmD; PGY1 Resident 02/08/2024 11:15 AM

## 2024-02-08 NOTE — Consult Note (Addendum)
 Advanced Heart Failure Team Consult Note  Primary Physician: Lawrance Handing, MD Cardiologist:  Vishnu P Mallipeddi, MD  Reason for Consultation: low output HF HPI:    Kiara [name] is seen today for evaluation of low output HF at the request of Dr. Wonda, Cardiology.   Kiara [name] is a 73 y.o. female with chronic HFrEF, CAD s/p CABG x2 14', DB, HLD, PAD and CKD stage 3a. AHF team to see for low output HF.   Followed by Dr. Darron for PAD and Dr. Mallipeddi with Cardiology.   Admitted earlier this week with SOB and chest pressure. Reports compliance with all medications and follows strict 2g Na diet. Work up in the ED showed HsTrop 2.7K> 2.9K>2.5K. BNP 1.2K. K 3.2, Na 135, SCr 1.65. AKI on admission. Diuretics were held for several days to allow renal recovery prior to [facility]. L/RHC today with patent LIMA to LAD and SVG to PDA with severe native vessel disease. No targets for PCI. RA 19, PA 78/48 (47), PCW 28, PA sat 45%, Fick CO/CI 4.6/2.3. TD CO/CI 3.6/1.8. Dr. Wonda asked us  to see for help with diuresis with low output HF.   Son and grandson at the bedside. Patient on bedrest post cath. Resting comfortably in bed. Denies CP/SOB. Asking about cpap.   Cardiac studies reviewed:  L/RHC today with patent LIMA to LAD and SVG to PDA with severe native vessel disease. No targets for PCI. RA 19, PA 78/48 (47), PCW 28, PA sat 45%, Fick CO/CI 4.6/2.3. TD CO/CI 3.6/1.8.  Echo 02/05/24 EF 30%, LV with GHK, mild LVH, RV not well seen, mild MR LHC 19': patent LIMA to LAD and possibly patent SVG to RPDA with normal LVEF   Home Medications Prior to Admission medications   Medication Sig Start Date End Date Taking? Authorizing Provider  acetaminophen  (TYLENOL ) 650 MG CR tablet Take 1,300 mg by mouth every 8 (eight) hours as needed for pain.   Yes [provider]  albuterol  (VENTOLIN  HFA) 108 (90 Base) MCG/ACT inhaler Inhale 2 puffs into the lungs every 4 (four) hours as needed for  shortness of breath or wheezing. 04/16/13  Yes [provider]  aspirin  EC 81 MG tablet Take 81 mg by mouth daily.    Yes [provider]  Cholecalciferol (VITAMIN D ) 2000 units tablet Take 2,000 Units by mouth daily.   Yes [provider]  cilostazol  (PLETAL ) 100 MG tablet Take 1 tablet (100 mg total) by mouth 2 (two) times daily. 12/10/23  Yes Mallipeddi, Vishnu P, MD  Cinnamon 500 MG capsule Take 500 mg by mouth daily.   Yes [provider]  Coenzyme Q10 (COQ10) 100 MG CAPS Take 100 mg by mouth daily.    Yes [provider]  DULoxetine  (CYMBALTA ) 60 MG capsule Take 60 mg by mouth daily. 04/09/23  Yes [provider]  ezetimibe  (ZETIA ) 10 MG tablet Take 1 tablet (10 mg total) by mouth daily. 12/10/23  Yes Mallipeddi, Vishnu P, MD  fluticasone  (FLONASE ) 50 MCG/ACT nasal spray Place 2 sprays into both nostrils at bedtime as needed for allergies or rhinitis.   Yes [provider]  furosemide  (LASIX ) 40 MG tablet Take 1 tablet (40 mg total) by mouth 2 (two) times daily. Patient taking differently: Take 40 mg by mouth daily. 09/17/23  Yes Meng, Hao, PA  gabapentin  (NEURONTIN ) 800 MG tablet Take 1,600 mg by mouth 3 (three) times daily. 07/29/13  Yes [provider]  hydrocortisone  cream 1 % Apply  1 Application topically daily as needed for itching.   Yes [provider]  insulin  aspart (NOVOLOG ) 100 UNIT/ML injection Inject 1.75 Units into the skin See admin instructions. 1.25 Units 12 Am-7 Am,  1.75 u/hr 7 AM  Til 12 AM, Bolus 1 unit for every 15 carbs, 1 Unit for every 50 points greater than 150 Insulin  pump 11/12/13  Yes [provider]  levocetirizine (XYZAL ) 5 MG tablet Take 5 mg by mouth daily.  07/21/13  Yes [provider]  metoprolol  succinate (TOPROL -XL) 25 MG 24 hr tablet TAKE 1 TABLET TWICE DAILY 11/06/23  Yes Mallipeddi, Vishnu P, MD  montelukast (SINGULAIR) 10 MG tablet Take 10 mg by mouth daily.  10/30/22  Yes [provider]  ofloxacin  (OCUFLOX ) 0.3 % ophthalmic solution Place 2 drops into the left eye 3 (three) times daily. 01/24/24  Yes [provider]  omeprazole (PRILOSEC) 40 MG capsule Take 40 mg by mouth daily. 01/23/19  Yes [provider]  rosuvastatin  (CRESTOR ) 40 MG tablet TAKE ONE TABLET BY MOUTH ONE TIME A DAY. STOP LIPITOR  03/12/23  Yes Hochrein, Lynwood, MD  Turmeric 500 MG TABS Take 1 tablet by mouth daily.   Yes [provider]    Past Medical History: Past Medical History:  Diagnosis Date   Anemia    Anxiety    Arthritis    Carotid artery disease (HCC)    a. Last carotid study in 2014 showed 1-39% RICA, 40-59% LICA.   Chronic combined systolic (congestive) and diastolic (congestive) heart failure (HCC)    a. EF 40-45% in 2014. b. EF 30-35% in 07/2017.   CKD (chronic kidney disease), stage III (HCC)    Coronary artery disease    a. s/p CABG 2014. b. Cath 07/2017 for NSTEMI -> med rx.   Diabetes mellitus (HCC)    Diabetic peripheral neuropathy (HCC)    Eczema    GERD (gastroesophageal reflux disease)    H/O hiatal hernia    Hyperlipidemia    Hypertension    Myocardial infarction (HCC)    PONV (postoperative nausea and vomiting)    Sleep apnea    Umbilical hernia     Past Surgical History: Past Surgical History:  Procedure Laterality Date   ABDOMINAL AORTOGRAM W/LOWER EXTREMITY N/A 03/06/2018   Procedure: ABDOMINAL AORTOGRAM W/LOWER EXTREMITY;  Surgeon: Darron Deatrice LABOR, MD;  Location: [facility];  Service: Cardiovascular;  Laterality: N/A;   ABDOMINAL AORTOGRAM W/LOWER EXTREMITY N/A 05/16/2023   Procedure: ABDOMINAL AORTOGRAM W/LOWER EXTREMITY;  Surgeon: Darron Deatrice LABOR, MD;  Location: [facility];  Service: Cardiovascular;  Laterality: N/A;   ABDOMINAL HYSTERECTOMY     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     CARDIAC CATHETERIZATION     CARPAL TUNNEL RELEASE     CESAREAN SECTION  1983   CORONARY ARTERY  BYPASS GRAFT N/A 04/11/2013   Procedure: CORONARY ARTERY BYPASS GRAFTING (CABG);  Surgeon: Dorise MARLA Fellers, MD;  Location: [facility] OR;  Service: Open Heart Surgery;  Laterality: N/A;   EYE SURGERY Bilateral    laser   FRACTURE SURGERY Left 2005   PERIPHERAL INTRAVASCULAR LITHOTRIPSY  05/16/2023   Procedure: PERIPHERAL INTRAVASCULAR LITHOTRIPSY;  Surgeon: Darron Deatrice LABOR, MD;  Location: [facility];  Service: Cardiovascular;;   PERIPHERAL VASCULAR INTERVENTION  05/16/2023   Procedure: PERIPHERAL VASCULAR INTERVENTION;  Surgeon: Darron Deatrice LABOR, MD;  Location: [facility];  Service: Cardiovascular;;   RIGHT/LEFT HEART CATH AND CORONARY/GRAFT ANGIOGRAPHY N/A  08/06/2017   Procedure: RIGHT/LEFT HEART CATH AND CORONARY/GRAFT ANGIOGRAPHY;  Surgeon: Mady Bruckner, MD;  Location: [facility];  Service: Cardiovascular;  Laterality: N/A;   Family History: Family History  Problem Relation Age of Onset   Heart attack Mother    Heart disease Mother    Hypertension Mother    Heart attack Brother    Heart disease Brother    Hypertension Brother    Social History: Social History   Socioeconomic History   Marital status: Widowed    Spouse name: Not on file   Number of children: Not on file   Years of education: Not on file   Highest education level: Not on file  Occupational History   Not on file  Tobacco Use   Smoking status: Former    Current packs/day: 0.00    Types: Cigarettes    Start date: 05/30/1985    Quit date: 05/30/2010    Years since quitting: 13.7    Passive exposure: Never   Smokeless tobacco: Never   Tobacco comments:    STOPPED 2007  Vaping Use   Vaping status: Never Used  Substance and Sexual Activity   Alcohol use: No   Drug use: No   Sexual activity: Not on file  Other Topics Concern   Not on file  Social History Narrative   Not on file   Social Drivers of Health   Financial Resource Strain: Not on file  Food Insecurity: No Food Insecurity  (02/06/2024)   Hunger Vital Sign    Worried About Running Out of Food in the Last Year: Never true    Ran Out of Food in the Last Year: Never true  Transportation Needs: No Transportation Needs (02/06/2024)   PRAPARE - Administrator, Civil Service (Medical): No    Lack of Transportation (Non-Medical): No  Physical Activity: Not on file  Stress: Not on file  Social Connections: Moderately Integrated (02/06/2024)   Social Connection and Isolation Panel    Frequency of Communication with Friends and Family: Three times a week    Frequency of Social Gatherings with Friends and Family: Three times a week    Attends Religious Services: More than 4 times per year    Active Member of Clubs or Organizations: Yes    Attends Banker Meetings: More than 4 times per year    Marital Status: Widowed   Allergies:  Allergies  Allergen Reactions   Bactrim [Sulfamethoxazole-Trimethoprim] Rash   Codeine Nausea And Vomiting   Hydromorphone  Nausea Only and Other (See Comments)    Diaphoretic, blood pressure issues, coughing,   Silvadene [Silver Sulfadiazine] Other (See Comments)    Red hot rash and blisters   Adhesive [Tape] Rash    Band aid brand     Mobic [Meloxicam] Swelling   Morphine  And Codeine Nausea Only and Other (See Comments)    Confusion    Objective:    Vital Signs:   Temp:  [97.8 F (36.6 C)-98.2 F (36.8 C)] 98.1 F (36.7 C) (07/11 1101) Pulse Rate:  [75-83] 75 (07/11 1101) Resp:  [15-22] 20 (07/11 1101) BP: (91-147)/(29-84) 105/84 (07/11 1101) SpO2:  [93 %-98 %] 98 % (07/11 1101) Weight:  [93.6 kg] 93.6 kg (07/11 0359) Last BM Date : 02/03/24  Weight change: Filed Weights   02/04/24 1929 02/06/24 0223 02/08/24 0359  Weight: 90.7 kg 92.7 kg 93.6 kg   Intake/Output:   Intake/Output Summary (Last 24 hours) at 02/08/2024 1141 Last data  filed at 02/08/2024 9277 Gross per 24 hour  Intake 340.06 ml  Output 2400 ml  Net -2059.94 ml    Physical Exam    General:  well appearing.  No respiratory difficulty Neck: supple. JVD difficult to see.  Cor: PMI nondisplaced. Regular rate & rhythm. No rubs, gallops or murmurs. Lungs: clear, diminished bases Extremities: no cyanosis, clubbing, rash, +1 LLE edema  Neuro: alert & oriented x 3. Moves all 4 extremities w/o difficulty. Affect pleasant.  Telemetry   NSR 90s (Personally reviewed)    EKG    NSR 91 bpm incomplete LBBB  Labs   Basic Metabolic Panel: Recent Labs  Lab 02/04/24 1959 02/05/24 1848 02/06/24 0316 02/07/24 0325 02/08/24 0355  NA 135 136 128* 129* 132*  K 3.2* 3.9 4.4 3.5 4.2  CL 92* 97* 91* 93* 93*  CO2 29 28 24 24 28   GLUCOSE 392* 285* 482* 118* 124*  BUN 26* 30* 38* 55* 57*  CREATININE 1.65* 1.95* 2.19* 2.42* 2.27*  CALCIUM  8.4* 8.5* 8.2* 8.3* 8.5*    Liver Function Tests: No results for input(s): AST, ALT, ALKPHOS, BILITOT, PROT, ALBUMIN  in the last 168 hours. Recent Labs  Lab 02/04/24 1959  LIPASE 22   No results for input(s): AMMONIA in the last 168 hours.  CBC: Recent Labs  Lab 02/04/24 1959 02/05/24 1042 02/06/24 0316 02/07/24 0325 02/08/24 0355  WBC 11.4* 15.9* 12.9* 13.1* 12.7*  HGB 9.6* 9.9* 8.7* 8.7* 8.7*  HCT 29.4* 30.1* 26.7* 26.3* 27.2*  MCV 92.7 92.3 91.8 90.1 92.2  PLT 205 189 184 237 268    Cardiac Enzymes: No results for input(s): CKTOTAL, CKMB, CKMBINDEX, TROPONINI in the last 168 hours.  BNP: BNP (last 3 results) Recent Labs    02/04/24 1959  BNP 1,264.0*    ProBNP (last 3 results) No results for input(s): PROBNP in the last 8760 hours.   CBG: Recent Labs  Lab 02/07/24 1126 02/07/24 1618 02/07/24 2138 02/08/24 0637 02/08/24 1032  GLUCAP 211* 302* 272* 134* 80    Coagulation Studies: No results for input(s): LABPROT, INR in the last 72 hours.   Imaging   CARDIAC CATHETERIZATION Result Date: 02/08/2024 Images from the original result were not included. Coronary and bypass  graft angiography 02/08/2024: LM: Appears to have dual ostia with essentially nonexistent LM LAD: Ostial 60^ disease, mid occlusion, bypassed by patent LIMA-LAD Lcx: Occluded OM's, diffuse 50% mid to distal disease RCA: Not engaged today. Known prox occluded         Bypassed by patent SVG-RCA. Diffuse PDA disease after touchdown Right heart catheterization 02/08/2024: RA: 19 mmHg RV: 77/2 mmHg PA: 78/48 mmHg, mPAP 47 mmHg PCW: 28 mmHg AO sats: 93% PA sats: 45% Fick method: CO: 4.6 L/min CI: 2.3 L/min/m2 Thermodilution: CO: 3.6 L/min CI: 1.8 L/min/m2 Conclusion: Severe native vessel disease Patent 2/2 grafts Coronary anatomy unchanged compared to 2019 Severe pulmonary hypertension, WHO group 2 Elevated filling pressures Severely decompensated cardiomyopathy, possibly combination ischemic and nonischemic Continue GDMT for HFrEF, unfortunately limited by renal dysfunction     Medications:     Current Medications:  aspirin  EC  81 mg Oral Daily   atorvastatin   80 mg Oral Daily   clopidogrel   75 mg Oral Daily   DULoxetine   60 mg Oral Daily   ezetimibe   10 mg Oral Daily   fluticasone   2 spray Each Nare QHS   furosemide   80 mg Intravenous BID   gabapentin   300 mg Oral BID   insulin   aspart  0-15 Units Subcutaneous TID WC   insulin  aspart  7 Units Subcutaneous TID WC   insulin  glargine-yfgn  18 Units Subcutaneous BID   loratadine   10 mg Oral Daily   metoprolol  succinate  50 mg Oral Daily   pantoprazole   40 mg Oral Daily    Infusions:  heparin  1,300 Units/hr (02/07/24 2008)    Patient Profile   Kiara [name] is a 73 y.o. female with chronic HFrEF, CAD s/p CABG x2 14', DB, HLD, PAD and CKD stage 3a. AHF team to see for low output HF.   Assessment/Plan  NSTEMI / CAD s/p CABG x2 43' - L/RHC today with patent LIMA to LAD and SVG to PDA with severe native vessel disease. No targets for PCI.  - denies chest pain - Continue ASA, statin and plavix   Acute on chronic HFrEF with low output HF -  Echo 02/05/24 EF 30%, LV with GHK, mild LVH, RV not well seen, mild MR - RA 19, PA 78/48 (47), PCW 28, PA sat 45%, Fick CO/CI 4.6/2.3. TD CO/CI 3.6/1.8.  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  - Place PICC to follow co-ox and CVP.  - GDMT currently limited with AKI - Hgb A1c 10.6 2 months ago. Will update. May benefit from SGLT2i  - Strict I&O, daily weights   AKI on CKD stage III - Renal function on admission 1.6 - Up to 2.4 after holding diuretics for 2 days - Down to 2.27 today - Follow with diuresis - Adding inotrope support today   Length of Stay: 3  Beckey LITTIE Coe, NP  02/08/2024, 11:41 AM  Advanced Heart Failure Team Pager [phone] (M-F; 7a - 5p)  Please contact [facility] for night-coverage after hours (4p -7a ) and weekends on [email or web address]   Patient seen with NP, I formulated the plan and agree with the above note.    Patient admitted with dyspnea and chest pain, HS-TnI elevated in 2900 range. Diuresed but developed AKI and Lasix  held.  Cath today with unchanged coronary/graft anatomy but elevated filling pressures and CI 1.8 by thermodilution.  Creatinine peaked at 2.4, now down to 2.27.   She reports exertional dyspnea.   General: NAD Neck: Thick, JVP 12-14 cm, no thyromegaly or thyroid  nodule.  Lungs: Clear to auscultation bilaterally with normal respiratory effort. CV: Nondisplaced PMI.  Heart regular S1/S2, no S3/S4, no murmur.  1+ ankle edema.  Difficult to palpate pedal pulses.   Abdomen: Soft, nontender, no hepatosplenomegaly, no distention.  Skin: Intact without lesions or rashes.  Neurologic: Alert and oriented x 3.  Psych: Normal affect. Extremities: No clubbing or cyanosis.  HEENT: Normal.   1. Acute on chronic systolic CHF: Ischemic cardiomyopathy.  Echo this admission with EF  30%, global hypokinesis (prior echo in 2020 with normal EF).  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  Situation complicated by AKI, creatinine 1.6 at admission, peaked at 2.4 and now 2.27.   She looks volume overloaded on exam.  - Will start milrinone  0.25 mcg/kg/min with low output on RHC to facilitate diuresis.  - Start with Lasix  80 mg IV bid and follow response.  - With volume overload/low output, decrease Toprol  XL to 25 mg daily.  -  Additional GDMT will depend on creatinine trend.  2. AKI on CKD stage 3: Creatinine initially 1.6, up to 2.4 then Lasix  held and back down to 2.27.  Suspect cardiorenal syndrome, with initial contrast bolus from CTA chest playing a role. Will need to follow closely with contrast from cath today.  - As above, adding milrinone  to maintain CO and will try to diurese given significant volume overload.  3. CAD: H/o CABG x 2.  NSTEMI at presentation with HS-TnI to 2900.  Cath today, however, showed patent LIMA-LAD and SVG-PDA with unchanged native vessels (occluded RCA, occluded OMs).  No target for intervention.  - Continue ASA 81/Plavix .  - Continue atorvastatin  and Zetia .  - No indication for ongoing heparin  gtt, will transition to [place] heparin  for DVT prophy.  4. PAD: Followed as outpatient by Dr. Darron.  5. 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  - Focus on diuresis for now.  6. Anemia: Hgb 8.7.   - Check Fe studies.   Ezra Shuck 02/08/2024 1:23 PM

## 2024-02-08 NOTE — Progress Notes (Signed)
   Heart Failure Stewardship Pharmacist Progress Note   PCP: Lawrance Handing, MD PCP-Cardiologist: Diannah SHAUNNA [name], MD    HPI:  73 yo F with PMH of CHF, CAD s/p CABG 2014, CKD III, HTN, HLD, and T2DM.   Presented to the ED on 7/7 with shortness of breath, shoulder and back pain, and intermittent chest pain. Troponin elevated [phone]. BNP 1264. CXR with small pleural effusions. CTA negative for PE, aneurysm, or dissection. ECHO 7/8 showed LVEF 30%, global hypokinesis, mild LVH, RV size normal, mild MR. Cath delayed with AKI. Underwent cath on 7/11 and found to have stable coronary anatomy from 2019, RA 19, PA 47, wedge 28, CO 4.6, CI 2.3 (3.6/1.8 thermo).   Met with patient and her son at bedside. Just returned from cath lab. Still short of breath and on 2L O2 (does not require supplemental O2 at home). Trace LE edema on exam. Usually receives her medications via mail order. Agreeable to using [facility] TOC pharmacy on discharge.  Current HF Medications: Beta Blocker: metoprolol  XL 50 mg daily  Prior to admission HF Medications: Diuretic: furosemide  40 mg daily Beta blocker: metoprolol  XL 25 mg BID  Pertinent Lab Values: Serum creatinine 2.42>2.27, BUN 57, Potassium 4.2, Sodium 132, BNP 1264, A1c 10.6 (11/14/23)   Vital Signs: Weight: 206 lbs (admission weight: 200 lbs) Blood pressure: 100/40-150/100s Heart rate: 80s  I/O: net -0.5L yesterday; net -1.5L since admission  Medication Assistance / Insurance Benefits Check: Does the patient have prescription insurance?  Yes Type of insurance plan: [place] [employer]  Does the patient qualify for medication assistance through manufacturers or grants?   Pending Eligible grants and/or patient assistance programs: pending Medication assistance applications in progress: none  Medication assistance applications approved: none Approved medication assistance renewals will be completed by: pending  Outpatient Pharmacy:  Prior to admission  outpatient pharmacy[facility]  Is the patient willing to use [facility] TOC pharmacy at discharge? Yes Is the patient willing to transition their outpatient pharmacy to utilize a [facility] outpatient pharmacy?   No    Assessment: 1. Acute on chronic systolic CHF (LVEF 30%), due to mixed ICM/NICM. NYHA class III symptoms. - RHC with RA 19, PA 47, wedge 28 - consider restarting IV lasix  at 80 IV BID - Cardiac output reduced on thermo - would stop metoprolol  XL 50 mg daily - Further GDMT pending improvement in renal function   Plan: 1) Medication changes recommended at this time: - Stop metoprolol  XL 50 mg daily - Start IV lasix  80 BID - Recheck A1c  2) Patient assistance: - None pending at this time - Has medicare and may need assistance via grant if Entresto /SGLT2i added later  3)  Education  - Initial education complete - Full education to be completed prior to discharge  Duwaine Plant, PharmD, BCPS Heart Failure Engineer, building services Phone [phone]

## 2024-02-08 NOTE — Progress Notes (Signed)
 ANTICOAGULATION CONSULT NOTE  Pharmacy Consult for Heparin  Indication: chest pain/ACS  Allergies  Allergen Reactions   Bactrim [Sulfamethoxazole-Trimethoprim] Rash   Codeine Nausea And Vomiting   Hydromorphone  Nausea Only and Other (See Comments)    Diaphoretic, blood pressure issues, coughing,   Silvadene [Silver Sulfadiazine] Other (See Comments)    Red hot rash and blisters   Adhesive [Tape] Rash    Band aid brand     Mobic [Meloxicam] Swelling   Morphine  And Codeine Nausea Only and Other (See Comments)    Confusion     Patient Measurements: Height: 5' 3 (160 cm) Weight: 93.6 kg (206 lb 5.6 oz) IBW/kg (Calculated) : 52.4 Heparin  Dosing Weight: 73.1 kg  Vital Signs: Temp: 98.1 F (36.7 C) (07/11 1101) Temp Source: Oral (07/11 1101) BP: 105/84 (07/11 1101) Pulse Rate: 75 (07/11 1101)  Labs: Recent Labs    02/06/24 0316 02/06/24 0754 02/07/24 0325 02/08/24 0355  HGB 8.7*  --  8.7* 8.7*  HCT 26.7*  --  26.3* 27.2*  PLT 184  --  237 268  HEPARINUNFRC  --  0.29* 0.32 0.32  CREATININE 2.19*  --  2.42* 2.27*  TROPONINIHS 2,517*  --   --   --     Estimated Creatinine Clearance: 25.1 mL/min (A) (by C-G formula based on SCr of 2.27 mg/dL (H)).   Medical History: Past Medical History:  Diagnosis Date   Anemia    Anxiety    Arthritis    Carotid artery disease (HCC)    a. Last carotid study in 2014 showed 1-39% RICA, 40-59% LICA.   Chronic combined systolic (congestive) and diastolic (congestive) heart failure (HCC)    a. EF 40-45% in 2014. b. EF 30-35% in 07/2017.   CKD (chronic kidney disease), stage III (HCC)    Coronary artery disease    a. s/p CABG 2014. b. Cath 07/2017 for NSTEMI -> med rx.   Diabetes mellitus (HCC)    Diabetic peripheral neuropathy (HCC)    Eczema    GERD (gastroesophageal reflux disease)    H/O hiatal hernia    Hyperlipidemia    Hypertension    Myocardial infarction (HCC)    PONV (postoperative nausea and vomiting)    Sleep apnea     Umbilical hernia     Medications:  Medications Prior to Admission  Medication Sig Dispense Refill Last Dose/Taking   acetaminophen  (TYLENOL ) 650 MG CR tablet Take 1,300 mg by mouth every 8 (eight) hours as needed for pain.   Past Month   albuterol  (VENTOLIN  HFA) 108 (90 Base) MCG/ACT inhaler Inhale 2 puffs into the lungs every 4 (four) hours as needed for shortness of breath or wheezing.   01/24/2024   aspirin  EC 81 MG tablet Take 81 mg by mouth daily.    02/04/2024 Morning   Cholecalciferol (VITAMIN D ) 2000 units tablet Take 2,000 Units by mouth daily.   02/04/2024 Morning   cilostazol  (PLETAL ) 100 MG tablet Take 1 tablet (100 mg total) by mouth 2 (two) times daily. 180 tablet 1 02/04/2024 Morning   Cinnamon 500 MG capsule Take 500 mg by mouth daily.   02/04/2024 Morning   Coenzyme Q10 (COQ10) 100 MG CAPS Take 100 mg by mouth daily.    02/04/2024 Morning   DULoxetine  (CYMBALTA ) 60 MG capsule Take 60 mg by mouth daily.   02/04/2024 Morning   ezetimibe  (ZETIA ) 10 MG tablet Take 1 tablet (10 mg total) by mouth daily. 90 tablet 3 02/04/2024 Morning   fluticasone  (FLONASE ) 50  MCG/ACT nasal spray Place 2 sprays into both nostrils at bedtime as needed for allergies or rhinitis.   02/03/2024   furosemide  (LASIX ) 40 MG tablet Take 1 tablet (40 mg total) by mouth 2 (two) times daily. (Patient taking differently: Take 40 mg by mouth daily.) 180 tablet 2 02/04/2024 Morning   gabapentin  (NEURONTIN ) 800 MG tablet Take 1,600 mg by mouth 3 (three) times daily.   02/04/2024 Morning   hydrocortisone  cream 1 % Apply 1 Application topically daily as needed for itching.   Past Month   insulin  aspart (NOVOLOG ) 100 UNIT/ML injection Inject 1.75 Units into the skin See admin instructions. 1.25 Units 12 Am-7 Am,  1.75 u/hr 7 AM  Til 12 AM, Bolus 1 unit for every 15 carbs, 1 Unit for every 50 points greater than 150 Insulin  pump   02/05/2024   levocetirizine (XYZAL ) 5 MG tablet Take 5 mg by mouth daily.    02/03/2024   metoprolol  succinate  (TOPROL -XL) 25 MG 24 hr tablet TAKE 1 TABLET TWICE DAILY 180 tablet 3 02/04/2024 Morning   montelukast (SINGULAIR) 10 MG tablet Take 10 mg by mouth daily.   02/04/2024 Morning   ofloxacin  (OCUFLOX ) 0.3 % ophthalmic solution Place 2 drops into the left eye 3 (three) times daily.   02/04/2024 Morning   omeprazole (PRILOSEC) 40 MG capsule Take 40 mg by mouth daily.   02/04/2024 Morning   rosuvastatin  (CRESTOR ) 40 MG tablet TAKE ONE TABLET BY MOUTH ONE TIME A DAY. STOP LIPITOR  90 tablet 3 02/04/2024 Morning   Turmeric 500 MG TABS Take 1 tablet by mouth daily.   02/04/2024 Morning   Scheduled:   aspirin  EC  81 mg Oral Daily   atorvastatin   80 mg Oral Daily   clopidogrel   75 mg Oral Daily   DULoxetine   60 mg Oral Daily   ezetimibe   10 mg Oral Daily   fluticasone   2 spray Each Nare QHS   furosemide   80 mg Intravenous BID   gabapentin   300 mg Oral BID   insulin  aspart  0-15 Units Subcutaneous TID WC   insulin  aspart  7 Units Subcutaneous TID WC   insulin  glargine-yfgn  18 Units Subcutaneous BID   loratadine   10 mg Oral Daily   metoprolol  succinate  50 mg Oral Daily   pantoprazole   40 mg Oral Daily   Infusions:   heparin  1,300 Units/hr (02/07/24 2008)   PRN: acetaminophen , alum & mag hydroxide-simeth, fentaNYL  (SUBLIMAZE ) injection, hydrALAZINE , ipratropium-albuterol , labetalol , ondansetron  (ZOFRAN ) IV, sodium chloride   Assessment: 73yo female c/o CP associated w/ SOB and upper back pain, troponin found to be elevated >> to begin heparin ; of note pt had some bloody sputum in ED, Hgb down from baseline (12.4 in April), will aim for low heparin  level and avoid boluses.   -7/11 heparin  level= 0.32 on 1300 units/hr -Hg 8.7 -post-cath 7/11  Goal of Therapy:  Heparin  level 0.3-0.5 units/ml Monitor platelets by anticoagulation protocol: Yes   Plan:  Heparin  d/c'd post-cath  Maurilio Patten, PharmD PGY1 Pharmacy Resident [facility]  02/08/2024 12:00 PM

## 2024-02-08 NOTE — Progress Notes (Signed)
 PROGRESS NOTE    Kiara [name]  FMW:[id number] DOB: 1951-02-19 DOA: 02/04/2024 PCP: Lawrance Handing, MD     Brief Narrative:  Kiara [name] is a 73 y.o. female with medical history significant of HFpEF, CAD s/p CABG in 2014, CKD3, HTN, HLD, and DM2 p/w cp and found to have elevated troponin and BNP c/f ACS.    Pt states that she was in her usual state of health until 0800 when she awoke from her sleep w/ acute onset SOB. She endorses intermittent CP lasting 5-63min over the past few days that worsens with activities like walking/talking and improves with rest. She reports following a strict low sodium diet and denies any recent weight gain or LE edema to accompany her SOB.    In the ED, pt tachycardic and tachypneic on 4L [place]. Labs notable for K 3.2, Cr 1.65, BNP 1264, troponin 2778-->2969, and WBC 15.9. CTA chest neg for PE, but did show b/l pleural effusions. Pt started on hep gtt for ACS, cards consulted per EDP, and pt admitted to medicine for ongoing w/u.  Patient underwent heart cath 7/11.   New events last 24 hours / Subjective: Patient seen post heart cath. She is sleeping/snoring. Family is at bedside and says she was still SOB this morning. We discussed cardiology recommendations.   Assessment & Plan:   Principal Problem:   NSTEMI (non-ST elevated myocardial infarction) (HCC) Active Problems:   Essential hypertension   Anxiety   HLD (hyperlipidemia)   Acute respiratory failure with hypoxia (HCC)   Acute on chronic combined systolic and diastolic CHF (congestive heart failure) (HCC)   ACS (acute coronary syndrome) (HCC)   Uncontrolled type 2 diabetes mellitus with hyperglycemia, with long-term current use of insulin  (HCC)   AKI (acute kidney injury) (HCC)   CKD stage 3b, GFR 30-44 ml/min (HCC)   NSTEMI - Appreciate cardiology, s/p heart cath tomorrow 7/11 - no targets for PCI  - Aspirin , Plavix , lipitor     Acute on chronic systolic and diastolic heart failure - EF  30% - Advanced heart failure team consulted, started milrinone  today. IV lasix    Acute hypoxemic respiratory failure - Desatted down to as low as 66% on room air.  Currently on 2 L nasal cannula O2  AKI on CKD stage IIIb - Baseline creatinine 1.7 - IV lasix  - monitor Cr   Hyperlipidemia - Lipitor    Diabetes mellitus type 2, uncontrolled with hyperglycemia - Hemoglobin A1c 10.6 in April 2025 - Semglee , NovoLog , sliding scale insulin    Leukocytosis - Reactive. Afebrile and no signs of infection     DVT prophylaxis: IV heparin  Code Status: Full code Family Communication: At bedside Disposition Plan: Home  Status is: Inpatient Remains inpatient appropriate because: IV therapies    Antimicrobials:  Anti-infectives (From admission, onward)    None        Objective: Vitals:   02/08/24 1115 02/08/24 1130 02/08/24 1200 02/08/24 1230  BP: 100/79 125/83 (!) 140/78 131/85  Pulse: 74 77 82 75  Resp: 12 (!) 22 14 19   Temp:      TempSrc:      SpO2: 98% 96% 97% 100%  Weight:      Height:        Intake/Output Summary (Last 24 hours) at 02/08/2024 1354 Last data filed at 02/08/2024 1300 Gross per 24 hour  Intake 340.06 ml  Output 2700 ml  Net -2359.94 ml   Filed Weights   02/04/24 1929 02/06/24 0223 02/08/24 0359  Weight:  90.7 kg 92.7 kg 93.6 kg    Examination:  General exam: Appears calm and comfortable  Gastrointestinal system: Abdomen is nondistended, soft  Extremities: Symmetric in appearance, +edema   Data Reviewed: I have personally reviewed following labs and imaging studies  CBC: Recent Labs  Lab 02/04/24 1959 02/05/24 1042 02/06/24 0316 02/07/24 0325 02/08/24 0355  WBC 11.4* 15.9* 12.9* 13.1* 12.7*  HGB 9.6* 9.9* 8.7* 8.7* 8.7*  HCT 29.4* 30.1* 26.7* 26.3* 27.2*  MCV 92.7 92.3 91.8 90.1 92.2  PLT 205 189 184 237 268   Basic Metabolic Panel: Recent Labs  Lab 02/04/24 1959 02/05/24 1848 02/06/24 0316 02/07/24 0325 02/08/24 0355  NA 135  136 128* 129* 132*  K 3.2* 3.9 4.4 3.5 4.2  CL 92* 97* 91* 93* 93*  CO2 29 28 24 24 28   GLUCOSE 392* 285* 482* 118* 124*  BUN 26* 30* 38* 55* 57*  CREATININE 1.65* 1.95* 2.19* 2.42* 2.27*  CALCIUM  8.4* 8.5* 8.2* 8.3* 8.5*   GFR: Estimated Creatinine Clearance: 25.1 mL/min (A) (by C-G formula based on SCr of 2.27 mg/dL (H)). Liver Function Tests: No results for input(s): AST, ALT, ALKPHOS, BILITOT, PROT, ALBUMIN  in the last 168 hours. Recent Labs  Lab 02/04/24 1959  LIPASE 22   No results for input(s): AMMONIA in the last 168 hours. Coagulation Profile: No results for input(s): INR, PROTIME in the last 168 hours. Cardiac Enzymes: No results for input(s): CKTOTAL, CKMB, CKMBINDEX, TROPONINI in the last 168 hours. BNP (last 3 results) No results for input(s): PROBNP in the last 8760 hours. HbA1C: No results for input(s): HGBA1C in the last 72 hours. CBG: Recent Labs  Lab 02/07/24 1126 02/07/24 1618 02/07/24 2138 02/08/24 0637 02/08/24 1032  GLUCAP 211* 302* 272* 134* 80   Lipid Profile: Recent Labs    02/07/24 0325  CHOL 77  HDL 29*  LDLCALC 28  TRIG 897  CHOLHDL 2.7   Thyroid  Function Tests: No results for input(s): TSH, T4TOTAL, FREET4, T3FREE, THYROIDAB in the last 72 hours. Anemia Panel: No results for input(s): VITAMINB12, FOLATE, FERRITIN, TIBC, IRON , RETICCTPCT in the last 72 hours. Sepsis Labs: No results for input(s): PROCALCITON, LATICACIDVEN in the last 168 hours.  No results found for this or any previous visit (from the past 240 hours).    Radiology Studies: CARDIAC CATHETERIZATION Addendum Date: 02/08/2024 Coronary and bypass graft angiography 02/08/2024: LM: Appears to have dual ostia with essentially nonexistent LM LAD: Ostial 60^ disease, mid occlusion, bypassed by patent LIMA-LAD Lcx: Occluded OM's, diffuse 50% mid to distal disease RCA: Not engaged today. Known prox occluded          Bypassed by patent SVG-RCA. 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 Known prox occluded  Bypassed by patent SVG-RCA. Diffuse PDA disease after touchdown Right heart catheterization 02/08/2024: RA: 19 mmHg RV: 77/2 mmHg PA: 78/48 mmHg, mPAP 47 mmHg PCW: 28 mmHg AO sats: 93% PA sats: 45% Fick method: CO: 4.6 L/min CI: 2.3 L/min/m2 Thermodilution: CO: 3.6 L/min CI: 1.8 L/min/m2 Conclusion: Severe native vessel disease Patent 2/2 grafts Coronary anatomy unchanged compared to 2019 Severe pulmonary hypertension, WHO group 2 Elevated filling pressures Severely decompensated cardiomyopathy, possibly combination ischemic and nonischemic Continue GDMT for HFrEF, unfortunately limited by renal dysfunction   US  EKG SITE RITE Result Date: 02/08/2024 If Site Rite image not attached, placement could not be confirmed due to current cardiac rhythm.     Scheduled Meds:  aspirin  EC  81 mg Oral Daily   atorvastatin   80 mg Oral Daily   clopidogrel   75 mg Oral Daily   DULoxetine   60 mg Oral Daily   ezetimibe   10 mg Oral Daily   fluticasone   2 spray Each  Nare QHS   furosemide   80 mg Intravenous BID   gabapentin   300 mg Oral BID   heparin  injection (subcutaneous)  5,000 Units Subcutaneous Q8H   insulin  aspart  0-15 Units Subcutaneous TID WC   insulin  aspart  7 Units Subcutaneous TID WC   insulin  glargine-yfgn  18 Units Subcutaneous BID   loratadine   10 mg Oral Daily   [START ON 02/09/2024] metoprolol  succinate  25 mg Oral Daily   pantoprazole   40 mg Oral Daily   Continuous Infusions:  milrinone  0.25 mcg/kg/min (02/08/24 1252)     LOS: 3 days   Time spent: 25 minutes   Delon Hoe, DO [facility] 02/08/2024, 1:54 PM   Available via Epic secure chat 7am-7pm After these hours, please refer to coverage provider listed on [email or web address]

## 2024-02-08 NOTE — Progress Notes (Signed)
  Progress Note  Patient Name: Kiara [name] Date of Encounter: 02/08/2024 [facility] Cardiologist: Diannah SHAUNNA [name], MD   Interval Summary   Patient back from the cardiac catheterization lab after undergoing right and left heart catheterization this morning  Vital Signs Vitals:   02/08/24 0945 02/08/24 1010 02/08/24 1015 02/08/24 1101  BP: 91/74 (!) 97/54 115/77 105/84  Pulse: 78 80 77 75  Resp: 18 20 (!) 22 20  Temp:    98.1 F (36.7 C)  TempSrc:    Oral  SpO2: 94% 96% 94% 98%  Weight:      Height:        Intake/Output Summary (Last 24 hours) at 02/08/2024 1121 Last data filed at 02/08/2024 9277 Gross per 24 hour  Intake 340.06 ml  Output 2400 ml  Net -2059.94 ml      02/08/2024    3:59 AM 02/06/2024    2:23 AM 02/04/2024    7:29 PM  Last 3 Weights  Weight (lbs) 206 lb 5.6 oz 204 lb 5.9 oz 200 lb  Weight (kg) 93.6 kg 92.7 kg 90.719 kg      Telemetry/ECG  Sinus rhythm - Personally Reviewed  Physical Exam  GEN: No acute distress.   Neck: JVP moderately elevated Cardiac: RRR, no murmurs, rubs, or gallops.  Respiratory: Clear to auscultation bilaterally. GI: Soft, nontender, non-distended  MS: No edema  Assessment & Plan  1.  Non-STEMI: Reviewed cardiac catheterization films.  Continues to have patency of both LIMA to LAD and saphenous vein graft to PDA with severe native vessel disease and no targets for PCI.  Continue aspirin  and clopidogrel .  Continue high intensity statin drug. 2.  Acute on chronic HFrEF: GDMT has been limited by acute on chronic kidney injury this admission.  Creatinine has plateaued now around 2.3.  Hemodynamics from this morning's procedure demonstrate elevated right and left heart intracardiac diastolic filling pressures and reduced cardiac output/index.  Will start IV diuresis.  Will request advanced heart failure consultation as I suspect the patient may need IV milrinone  for treatment of her decompensated heart failure. 3.  AKI  on background of CKD stage III: Creatinine now at a plateau phase.  Hopefully will improve with augmentation of cardiac output.  See above.  Advanced heart failure consult placed.  Start IV diuresis with furosemide  80 mg twice daily. 4.  Anemia: Stable normocytic anemia with hemoglobin 8.7 today.  Other medical problems per primary team. Plan for today post cath: Med Rx for treatment of CAD (ASA/clopidogrel /high-intensity statin) Diurese with IV lasix  80 mg BID Advanced HF consult consider milrinone  infusion    For questions or updates, please contact  [facility] Please consult [email or web address] for contact info under       Signed, Ozell Fell, MD

## 2024-02-08 NOTE — Progress Notes (Signed)
 [facility] night cross cover note:   I was notified by the patient's RN of the patient's dry nares while on supplemental oxygen via nasal cannula.  I subsequently ordered prn Ocean nasal spray for dry nares, and updated her supplemental oxygen in order to include humidity.   Eva Pore, DO Hospitalist

## 2024-02-08 NOTE — Progress Notes (Signed)
 Heart Failure Navigator Progress Note  Assessed for Heart & Vascular TOC clinic readiness.  Patient does not meet criteria due to Advanced Heart Failure Team consulted. .   Navigator will sign off at this time.   Rhae Hammock, BSN, Scientist, clinical (histocompatibility and immunogenetics) Only

## 2024-02-09 DIAGNOSIS — I214 Non-ST elevation (NSTEMI) myocardial infarction: Secondary | ICD-10-CM | POA: Diagnosis not present

## 2024-02-09 LAB — MAGNESIUM: Magnesium: 2.5 mg/dL — ABNORMAL HIGH (ref 1.7–2.4)

## 2024-02-09 LAB — GLUCOSE, CAPILLARY
Glucose-Capillary: 329 mg/dL — ABNORMAL HIGH (ref 70–99)
Glucose-Capillary: 316 mg/dL — ABNORMAL HIGH (ref 70–99)
Glucose-Capillary: 234 mg/dL — ABNORMAL HIGH (ref 70–99)
Glucose-Capillary: 234 mg/dL — ABNORMAL HIGH (ref 70–99)
Glucose-Capillary: 240 mg/dL — ABNORMAL HIGH (ref 70–99)
Glucose-Capillary: 181 mg/dL — ABNORMAL HIGH (ref 70–99)

## 2024-02-09 LAB — CBC
HCT: 27.7 % — ABNORMAL LOW (ref 36.0–46.0)
Hemoglobin: 9.2 g/dL — ABNORMAL LOW (ref 12.0–15.0)
MCH: 30.6 pg (ref 26.0–34.0)
MCHC: 33.2 g/dL (ref 30.0–36.0)
MCV: 92 fL (ref 80.0–100.0)
Platelets: 287 K/uL (ref 150–400)
RBC: 3.01 MIL/uL — ABNORMAL LOW (ref 3.87–5.11)
RDW: 14.3 % (ref 11.5–15.5)
WBC: 10.8 K/uL — ABNORMAL HIGH (ref 4.0–10.5)
nRBC: 0 % (ref 0.0–0.2)

## 2024-02-09 LAB — BASIC METABOLIC PANEL WITH GFR
Anion gap: 14 (ref 5–15)
BUN: 44 mg/dL — ABNORMAL HIGH (ref 8–23)
CO2: 28 mmol/L (ref 22–32)
Calcium: 8.3 mg/dL — ABNORMAL LOW (ref 8.9–10.3)
Chloride: 93 mmol/L — ABNORMAL LOW (ref 98–111)
Creatinine, Ser: 1.77 mg/dL — ABNORMAL HIGH (ref 0.44–1.00)
GFR, Estimated: 31 mL/min — ABNORMAL LOW (ref >60)
Glucose, Bld: 310 mg/dL — ABNORMAL HIGH (ref 70–99)
Potassium: 3.3 mmol/L — ABNORMAL LOW (ref 3.5–5.1)
Sodium: 135 mmol/L (ref 135–145)

## 2024-02-09 MED ORDER — INSULIN GLARGINE-YFGN 100 UNIT/ML ~~LOC~~ SOLN
22.0000 [IU] | Freq: Two times a day (BID) | SUBCUTANEOUS | Status: DC
  Administered 2024-02-09 – 2024-02-11 (×5): 22 [IU] via SUBCUTANEOUS
  Filled 2024-02-09 (×7): qty 0.22

## 2024-02-09 MED ORDER — POTASSIUM CHLORIDE CRYS ER 20 MEQ PO TBCR
40.0000 meq | EXTENDED_RELEASE_TABLET | ORAL | Status: AC
  Administered 2024-02-09 (×2): 40 meq via ORAL
  Filled 2024-02-09 (×2): qty 2

## 2024-02-09 NOTE — Progress Notes (Signed)
 PROGRESS NOTE    Kiara [name]  FMW:[id number] DOB: Feb 19, 1951 DOA: 02/04/2024 PCP: Lawrance Handing, MD     Brief Narrative:  Kiara [name] is a 73 y.o. female with medical history significant of HFpEF, CAD s/p CABG in 2014, CKD3, HTN, HLD, and DM2 p/w cp and found to have elevated troponin and BNP c/f ACS.    Pt states that she was in her usual state of health until 0800 when she awoke from her sleep w/ acute onset SOB. She endorses intermittent CP lasting 5-62min over the past few days that worsens with activities like walking/talking and improves with rest. She reports following a strict low sodium diet and denies any recent weight gain or LE edema to accompany her SOB.    In the ED, pt tachycardic and tachypneic on 4L [place]. Labs notable for K 3.2, Cr 1.65, BNP 1264, troponin 2778-->2969, and WBC 15.9. CTA chest neg for PE, but did show b/l pleural effusions. Pt started on hep gtt for ACS, cards consulted per EDP, and pt admitted to medicine for ongoing w/u.  Patient underwent heart cath 7/11.   New events last 24 hours / Subjective: UOP documented >7,073ml yesterday, confirmed this with daytime and nighttime nurse and this is accurate and actually underestimated since some urine spilled over the purewick canister. Patient reports feeling much better today. She is in good spirits, off O2.   Assessment & Plan:   Principal Problem:   NSTEMI (non-ST elevated myocardial infarction) (HCC) Active Problems:   Essential hypertension   Anxiety   HLD (hyperlipidemia)   Acute respiratory failure with hypoxia (HCC)   Acute on chronic combined systolic and diastolic CHF (congestive heart failure) (HCC)   ACS (acute coronary syndrome) (HCC)   Uncontrolled type 2 diabetes mellitus with hyperglycemia, with long-term current use of insulin  (HCC)   AKI (acute kidney injury) (HCC)   CKD stage 3b, GFR 30-44 ml/min (HCC)   NSTEMI - Appreciate cardiology, s/p heart cath tomorrow 7/11 - no targets  for PCI  - Aspirin , Plavix , lipitor     Acute on chronic systolic and diastolic heart failure - EF 30% - Advanced heart failure team consulted, started milrinone . IV lasix    Acute hypoxemic respiratory failure - Desatted down to as low as 66% on room air.  Required 2 L nasal cannula O2. Now on room air   AKI on CKD stage IIIb - Baseline creatinine 1.7 - IV lasix  - monitor Cr - stable today   Hyperlipidemia - Lipitor    Diabetes mellitus type 2, uncontrolled with hyperglycemia - Hemoglobin A1c 10.6 in April 2025 - Semglee , NovoLog , sliding scale insulin    Leukocytosis - Reactive. Afebrile and no signs of infection   Hypokalemia - Replace     DVT prophylaxis: Subc hep  Code Status: Full code Family Communication: None at bedside Disposition Plan: Home  Status is: Inpatient Remains inpatient appropriate because: IV therapies    Antimicrobials:  Anti-infectives (From admission, onward)    None        Objective: Vitals:   02/09/24 0316 02/09/24 0743 02/09/24 0920 02/09/24 1134  BP: (!) 129/106 (!) 161/62  (!) 152/65  Pulse: 89 95  97  Resp: 20 20  18   Temp:  97.6 F (36.4 C)  97.8 F (36.6 C)  TempSrc:  Axillary  Oral  SpO2: 99% 99%    Weight:   86.5 kg   Height:        Intake/Output Summary (Last 24 hours) at 02/09/2024 1213  Last data filed at 02/09/2024 1138 Gross per 24 hour  Intake 1257 ml  Output 7200 ml  Net -5943 ml   Filed Weights   02/06/24 0223 02/08/24 0359 02/09/24 0920  Weight: 92.7 kg 93.6 kg 86.5 kg   Examination: General exam: Appears calm and comfortable  Respiratory system: Clear to auscultation. Respiratory effort normal. On room air  Cardiovascular system: S1 & S2 heard.  Gastrointestinal system: Abdomen is nondistended, soft and nontender. Normal bowel sounds heard. Central nervous system: Alert and oriented. Non focal exam. Speech clear  Extremities: Symmetric in appearance bilaterally  Skin: No rashes, lesions or ulcers on  exposed skin  Psychiatry: Judgement and insight appear stable. Mood & affect appropriate.    Data Reviewed: I have personally reviewed following labs and imaging studies  CBC: Recent Labs  Lab 02/05/24 1042 02/06/24 0316 02/07/24 0325 02/08/24 0355 02/08/24 0826 02/08/24 0830 02/09/24 0607  WBC 15.9* 12.9* 13.1* 12.7*  --   --  10.8*  HGB 9.9* 8.7* 8.7* 8.7* 8.5*  8.8* 8.8* 9.2*  HCT 30.1* 26.7* 26.3* 27.2* 25.0*  26.0* 26.0* 27.7*  MCV 92.3 91.8 90.1 92.2  --   --  92.0  PLT 189 184 237 268  --   --  287   Basic Metabolic Panel: Recent Labs  Lab 02/05/24 1848 02/06/24 0316 02/07/24 0325 02/08/24 0355 02/08/24 0826 02/08/24 0830 02/09/24 0607  NA 136 128* 129* 132* 136  136 136 135  K 3.9 4.4 3.5 4.2 3.7  3.7 3.7 3.3*  CL 97* 91* 93* 93*  --   --  93*  CO2 28 24 24 28   --   --  28  GLUCOSE 285* 482* 118* 124*  --   --  310*  BUN 30* 38* 55* 57*  --   --  44*  CREATININE 1.95* 2.19* 2.42* 2.27*  --   --  1.77*  CALCIUM  8.5* 8.2* 8.3* 8.5*  --   --  8.3*  MG  --   --   --   --   --   --  2.5*   GFR: Estimated Creatinine Clearance: 30.8 mL/min (A) (by C-G formula based on SCr of 1.77 mg/dL (H)). Liver Function Tests: No results for input(s): AST, ALT, ALKPHOS, BILITOT, PROT, ALBUMIN  in the last 168 hours. Recent Labs  Lab 02/04/24 1959  LIPASE 22   No results for input(s): AMMONIA in the last 168 hours. Coagulation Profile: No results for input(s): INR, PROTIME in the last 168 hours. Cardiac Enzymes: No results for input(s): CKTOTAL, CKMB, CKMBINDEX, TROPONINI in the last 168 hours. BNP (last 3 results) No results for input(s): PROBNP in the last 8760 hours. HbA1C: No results for input(s): HGBA1C in the last 72 hours. CBG: Recent Labs  Lab 02/08/24 2057 02/09/24 0500 02/09/24 0618 02/09/24 0859 02/09/24 1132  GLUCAP 283* 329* 316* 234* 234*   Lipid Profile: Recent Labs    02/07/24 0325  CHOL 77  HDL 29*   LDLCALC 28  TRIG 102  CHOLHDL 2.7   Thyroid  Function Tests: No results for input(s): TSH, T4TOTAL, FREET4, T3FREE, THYROIDAB in the last 72 hours. Anemia Panel: No results for input(s): VITAMINB12, FOLATE, FERRITIN, TIBC, IRON , RETICCTPCT in the last 72 hours. Sepsis Labs: No results for input(s): PROCALCITON, LATICACIDVEN in the last 168 hours.  No results found for this or any previous visit (from the past 240 hours).    Radiology Studies: CARDIAC CATHETERIZATION Addendum Date: 02/08/2024 Coronary and bypass graft angiography  02/08/2024: LM: Appears to have dual ostia with essentially nonexistent LM LAD: Ostial 60^ disease, mid occlusion, bypassed by patent LIMA-LAD Lcx: Occluded OM's, diffuse 50% mid to distal disease RCA: Not engaged today. Known prox occluded         Bypassed by patent SVG-RCA. 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 Known prox occluded         Bypassed by patent SVG-RCA. Diffuse PDA disease after touchdown Right heart catheterization 02/08/2024: RA: 19 mmHg RV: 77/2 mmHg PA: 78/48 mmHg,  mPAP 47 mmHg PCW: 28 mmHg AO sats: 93% PA sats: 45% Fick method: CO: 4.6 L/min CI: 2.3 L/min/m2 Thermodilution: CO: 3.6 L/min CI: 1.8 L/min/m2 Conclusion: Severe native vessel disease Patent 2/2 grafts Coronary anatomy unchanged compared to 2019 Severe pulmonary hypertension, WHO group 2 Elevated filling pressures Severely decompensated cardiomyopathy, possibly combination ischemic and nonischemic Continue GDMT for HFrEF, unfortunately limited by renal dysfunction   US  EKG SITE RITE Result Date: 02/08/2024 If Site Rite image not attached, placement could not be confirmed due to current cardiac rhythm.     Scheduled Meds:  aspirin  EC  81 mg Oral Daily   atorvastatin   80 mg Oral Daily   Chlorhexidine  Gluconate Cloth  6 each Topical Daily   clopidogrel   75 mg Oral Daily   DULoxetine   60 mg Oral Daily   ezetimibe   10 mg Oral Daily   fluticasone   2 spray Each Nare QHS   furosemide   80 mg Intravenous BID   gabapentin   300 mg Oral BID   heparin  injection (subcutaneous)  5,000 Units Subcutaneous Q8H   insulin  aspart  0-15 Units Subcutaneous TID WC   insulin  aspart  7 Units Subcutaneous TID WC   insulin  glargine-yfgn  22 Units Subcutaneous BID   loratadine   10 mg Oral Daily   metoprolol  succinate  25 mg Oral Daily   pantoprazole   40 mg Oral Daily   potassium chloride   40 mEq Oral Q4H   sodium chloride  flush  10-40 mL Intracatheter Q12H   Continuous Infusions:  milrinone  0.25 mcg/kg/min (02/08/24 1252)     LOS: 4 days   Time spent: 25 minutes   Delon Hoe, DO [facility] 02/09/2024, 12:13 PM   Available via Epic secure chat 7am-7pm After these hours, please refer to coverage provider listed on [email or web address]

## 2024-02-09 NOTE — Plan of Care (Signed)
  Problem: Education: Goal: Ability to describe self-care measures that may prevent or decrease complications (Diabetes Survival Skills Education) will improve Outcome: Progressing   Problem: Clinical Measurements: Goal: Respiratory complications will improve Outcome: Progressing   Problem: Coping: Goal: Level of anxiety will decrease Outcome: Progressing   Problem: Pain Managment: Goal: General experience of comfort will improve and/or be controlled Outcome: Progressing   Problem: Safety: Goal: Ability to remain free from injury will improve Outcome: Progressing   Problem: Skin Integrity: Goal: Risk for impaired skin integrity will decrease Outcome: Progressing   Problem: Activity: Goal: Ability to return to baseline activity level will improve Outcome: Progressing   Problem: Cardiovascular: Goal: Ability to achieve and maintain adequate cardiovascular perfusion will improve Outcome: Progressing   Problem: Health Behavior/Discharge Planning: Goal: Ability to safely manage health-related needs after discharge will improve Outcome: Progressing

## 2024-02-09 NOTE — Progress Notes (Signed)
 Advanced Heart Failure Rounding Note   Subjective:     Started on milrinone  and IV lasix  post cath yesterday. Diuresed 7L   Scr 2.27 -> 1.77  K 3.3   CVP 6-7  No co-ox drawn   Feeling much better. Denies SOB, orthopnea or PND     Objective:   Weight Range:  Vital Signs:   Temp:  [97.6 F (36.4 C)-99 F (37.2 C)] 97.8 F (36.6 C) (07/12 1134) Pulse Rate:  [65-98] 97 (07/12 1134) Resp:  [16-20] 18 (07/12 1134) BP: (109-161)/(49-106) 152/65 (07/12 1134) SpO2:  [91 %-99 %] 99 % (07/12 0743) Weight:  [86.5 kg] 86.5 kg (07/12 0920) Last BM Date : 02/03/24  Weight change: Filed Weights   02/06/24 0223 02/08/24 0359 02/09/24 0920  Weight: 92.7 kg 93.6 kg 86.5 kg    Intake/Output:   Intake/Output Summary (Last 24 hours) at 02/09/2024 1357 Last data filed at 02/09/2024 1138 Gross per 24 hour  Intake 1257 ml  Output 6400 ml  Net -5143 ml     Physical Exam: General:  obese woman sitting in chair No resp difficulty HEENT: normal Neck: supple. JVP 6. Carotids 2+ bilat; no bruits. No lymphadenopathy or thryomegaly appreciated. Cor: PMI nondisplaced. Regular rate & rhythm. No rubs, gallops or murmurs. Lungs: clear Abdomen: soft, nontender, nondistended. No hepatosplenomegaly. No bruits or masses. Good bowel sounds. Extremities: no cyanosis, clubbing, rash, edema Neuro: alert & orientedx3, cranial nerves grossly intact. moves all 4 extremities w/o difficulty. Affect pleasant  Telemetry: Sinus 90s Personally reviewed  Labs: Basic Metabolic Panel: Recent Labs  Lab 02/05/24 1848 02/06/24 0316 02/07/24 0325 02/08/24 0355 02/08/24 0826 02/08/24 0830 02/09/24 0607  NA 136 128* 129* 132* 136  136 136 135  K 3.9 4.4 3.5 4.2 3.7  3.7 3.7 3.3*  CL 97* 91* 93* 93*  --   --  93*  CO2 28 24 24 28   --   --  28  GLUCOSE 285* 482* 118* 124*  --   --  310*  BUN 30* 38* 55* 57*  --   --  44*  CREATININE 1.95* 2.19* 2.42* 2.27*  --   --  1.77*  CALCIUM  8.5* 8.2* 8.3*  8.5*  --   --  8.3*  MG  --   --   --   --   --   --  2.5*    Liver Function Tests: No results for input(s): AST, ALT, ALKPHOS, BILITOT, PROT, ALBUMIN  in the last 168 hours. Recent Labs  Lab 02/04/24 1959  LIPASE 22   No results for input(s): AMMONIA in the last 168 hours.  CBC: Recent Labs  Lab 02/05/24 1042 02/06/24 0316 02/07/24 0325 02/08/24 0355 02/08/24 0826 02/08/24 0830 02/09/24 0607  WBC 15.9* 12.9* 13.1* 12.7*  --   --  10.8*  HGB 9.9* 8.7* 8.7* 8.7* 8.5*  8.8* 8.8* 9.2*  HCT 30.1* 26.7* 26.3* 27.2* 25.0*  26.0* 26.0* 27.7*  MCV 92.3 91.8 90.1 92.2  --   --  92.0  PLT 189 184 237 268  --   --  287    Cardiac Enzymes: No results for input(s): CKTOTAL, CKMB, CKMBINDEX, TROPONINI in the last 168 hours.  BNP: BNP (last 3 results) Recent Labs    02/04/24 1959  BNP 1,264.0*    ProBNP (last 3 results) No results for input(s): PROBNP in the last 8760 hours.    Other results:  Imaging: CARDIAC CATHETERIZATION Addendum Date: 02/08/2024 Coronary and bypass graft  angiography 02/08/2024: LM: Appears to have dual ostia with essentially nonexistent LM LAD: Ostial 60^ disease, mid occlusion, bypassed by patent LIMA-LAD Lcx: Occluded OM's, diffuse 50% mid to distal disease RCA: Not engaged today. Known prox occluded         Bypassed by patent SVG-RCA. Diffuse PDA disease after touchdown Right heart catheterization 02/08/2024: RA: 19 mmHg RV: 77/2 mmHg PA: 78/48 mmHg, mPAP 47 mmHg PCW: 28 mmHg AO sats: 93% PA sats: 45% Fick method: CO: 4.6 L/min CI: 2.3 L/min/m2 Thermodilution: CO: 3.6 L/min CI: 1.8 L/min/m2 Conclusion: Severe native vessel disease Patent 2/2 grafts Coronary anatomy unchanged compared to 2019 Severe pulmonary hypertension, WHO group 2 Elevated filling pressures Severely decompensated cardiomyopathy, possibly combination ischemic and nonischemic Continue GDMT for HFrEF, unfortunately limited by renal dysfunction  Result Date:  02/08/2024 Images from the original result were not included. 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 Known prox occluded         Bypassed by patent SVG-RCA. Diffuse PDA disease after touchdown Right heart catheterization 02/08/2024: RA: 19 mmHg RV: 77/2 mmHg PA: 78/48 mmHg, mPAP 47 mmHg PCW: 28 mmHg AO sats: 93% PA sats: 45% Fick method: CO: 4.6 L/min CI: 2.3 L/min/m2 Thermodilution: CO: 3.6 L/min CI: 1.8 L/min/m2 Conclusion: Severe native vessel disease Patent 2/2 grafts Coronary anatomy unchanged compared to 2019 Severe pulmonary hypertension, WHO group 2 Elevated filling pressures Severely decompensated cardiomyopathy, possibly combination ischemic and nonischemic Continue GDMT for HFrEF, unfortunately limited by renal dysfunction   US  EKG SITE RITE Result Date: 02/08/2024 If Site Rite image not attached, placement could not be confirmed due to current cardiac rhythm.    Medications:     Scheduled Medications:  aspirin  EC  81 mg Oral Daily   atorvastatin   80 mg Oral Daily   Chlorhexidine  Gluconate Cloth  6 each Topical Daily   clopidogrel   75 mg Oral Daily   DULoxetine   60 mg Oral Daily   ezetimibe   10 mg Oral Daily   fluticasone   2 spray Each Nare QHS   furosemide   80 mg Intravenous BID   gabapentin   300 mg Oral BID   heparin  injection (subcutaneous)  5,000 Units Subcutaneous Q8H   insulin  aspart  0-15 Units Subcutaneous TID WC   insulin  aspart  7 Units Subcutaneous TID WC   insulin  glargine-yfgn  22 Units Subcutaneous BID   loratadine   10 mg Oral Daily   metoprolol  succinate  25 mg Oral Daily   pantoprazole   40 mg Oral Daily   potassium chloride   40 mEq Oral Q4H   sodium chloride  flush  10-40 mL Intracatheter Q12H    Infusions:  milrinone  0.25 mcg/kg/min (02/08/24  1252)    PRN Medications: acetaminophen , alum & mag hydroxide-simeth, fentaNYL  (SUBLIMAZE ) injection, ipratropium-albuterol , ondansetron  (ZOFRAN ) IV, sodium chloride , sodium chloride  flush   Assessment/Plan:   1. Acute on chronic systolic CHF: Ischemic cardiomyopathy.  Echo this admission with EF 30%, global hypokinesis (prior echo in 2020 with normal EF).  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  Situation complicated by AKI, creatinine 1.6 at admission, peaked at 2.4 and now 2.27.   She looks volume overloaded on exam.  - On milrinone  0.25 and IV lasix  -> out 7L  - CVP 6.  Will continue IV lasix  one more day - Hopefully can begin milrinone  wean tomorrow. Needs co-ox  - With volume overload/low output, Toprol  XL decreased to 25 mg daily.  - Additional GDMT will depend on creatinine trend.  - Plan cMRI Monday to assess degree of scarring  2. AKI on CKD stage 3b: Creatinine initially 1.6, up to 2.4 then Lasix  held and back down to 2.27.  Suspect cardiorenal syndrome, with initial contrast bolus from CTA chest playing a role. - Scr improved to 1.77 with inotrope support and diuresis  - Continue to follow  3. CAD: H/o CABG x 2.  NSTEMI at presentation with HS-TnI to 2900.  Cath today, however, showed patent LIMA-LAD and SVG-PDA with unchanged native vessels (occluded RCA, occluded OMs).  No target for intervention.  - Continue ASA 81/Plavix .  - Continue atorvastatin  and Zetia .  - No s/s angina 4. PAD: Followed as outpatient by Dr. Darron.  - No rest pain  5. 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  - Focus on diuresis for now.  6. Anemia: Hgb stable 9.2 - Check Fe studies.  7. Hypokalemia - K 3.3  - will supp    Length of Stay: 4   Lona Six MD  02/09/2024, 1:57  PM  Advanced Heart Failure Team Pager [phone] (M-F; 7a - 4p)  Please contact [facility] for night-coverage after hours (4p -7a ) and weekends on [email or web address]

## 2024-02-10 DIAGNOSIS — I214 Non-ST elevation (NSTEMI) myocardial infarction: Secondary | ICD-10-CM | POA: Diagnosis not present

## 2024-02-10 LAB — GLUCOSE, CAPILLARY
Glucose-Capillary: 97 mg/dL (ref 70–99)
Glucose-Capillary: 292 mg/dL — ABNORMAL HIGH (ref 70–99)
Glucose-Capillary: 234 mg/dL — ABNORMAL HIGH (ref 70–99)
Glucose-Capillary: 138 mg/dL — ABNORMAL HIGH (ref 70–99)

## 2024-02-10 LAB — CBC
HCT: 29.6 % — ABNORMAL LOW (ref 36.0–46.0)
Hemoglobin: 9.6 g/dL — ABNORMAL LOW (ref 12.0–15.0)
MCH: 29.7 pg (ref 26.0–34.0)
MCHC: 32.4 g/dL (ref 30.0–36.0)
MCV: 91.6 fL (ref 80.0–100.0)
Platelets: 341 K/uL (ref 150–400)
RBC: 3.23 MIL/uL — ABNORMAL LOW (ref 3.87–5.11)
RDW: 14.3 % (ref 11.5–15.5)
WBC: 14.2 K/uL — ABNORMAL HIGH (ref 4.0–10.5)
nRBC: 0.1 % (ref 0.0–0.2)

## 2024-02-10 LAB — FERRITIN: Ferritin: 100 ng/mL (ref 11–307)

## 2024-02-10 LAB — IRON AND TIBC
Iron: 30 ug/dL (ref 28–170)
Saturation Ratios: 16 % (ref 10.4–31.8)
TIBC: 189 ug/dL — ABNORMAL LOW (ref 250–450)
UIBC: 159 ug/dL

## 2024-02-10 LAB — BASIC METABOLIC PANEL WITH GFR
Anion gap: 10 (ref 5–15)
BUN: 35 mg/dL — ABNORMAL HIGH (ref 8–23)
CO2: 31 mmol/L (ref 22–32)
Calcium: 8.4 mg/dL — ABNORMAL LOW (ref 8.9–10.3)
Chloride: 94 mmol/L — ABNORMAL LOW (ref 98–111)
Creatinine, Ser: 1.77 mg/dL — ABNORMAL HIGH (ref 0.44–1.00)
GFR, Estimated: 31 mL/min — ABNORMAL LOW (ref >60)
Glucose, Bld: 192 mg/dL — ABNORMAL HIGH (ref 70–99)
Potassium: 3.3 mmol/L — ABNORMAL LOW (ref 3.5–5.1)
Sodium: 135 mmol/L (ref 135–145)

## 2024-02-10 LAB — COOXEMETRY PANEL
Carboxyhemoglobin: 3.2 % — ABNORMAL HIGH (ref 0.5–1.5)
Methemoglobin: 0.9 % (ref 0.0–1.5)
O2 Saturation: 66.5 %
Total hemoglobin: 9.8 g/dL — ABNORMAL LOW (ref 12.0–16.0)

## 2024-02-10 LAB — PROCALCITONIN: Procalcitonin: 0.39 ng/mL

## 2024-02-10 LAB — MAGNESIUM: Magnesium: 2.1 mg/dL (ref 1.7–2.4)

## 2024-02-10 MED ORDER — POTASSIUM CHLORIDE CRYS ER 20 MEQ PO TBCR
40.0000 meq | EXTENDED_RELEASE_TABLET | ORAL | Status: AC
  Administered 2024-02-10 (×2): 40 meq via ORAL
  Filled 2024-02-10 (×2): qty 2

## 2024-02-10 NOTE — Progress Notes (Addendum)
 Advanced Heart Failure Rounding Note   Subjective:     Remains on milrinone  and IV lasix . Down another 6L. Weight down 18 pounds in 2 days.   Feels much better. Denies SOB, orthopnea or PND   Scr 2.27 -> 1.77 -> 1.77  K 3.3   Co-ox 67%  CVP 3-4    Objective:   Weight Range:  Vital Signs:   Temp:  [97.8 F (36.6 C)-98.7 F (37.1 C)] 98 F (36.7 C) (07/13 0742) Pulse Rate:  [91-99] 98 (07/13 0742) Resp:  [16-18] 16 (07/13 0742) BP: (104-152)/(45-78) 113/51 (07/13 0742) SpO2:  [91 %-95 %] 95 % (07/13 0742) Weight:  [85.6 kg] 85.6 kg (07/13 0617) Last BM Date : 02/03/24  Weight change: Filed Weights   02/08/24 0359 02/09/24 0920 02/10/24 0617  Weight: 93.6 kg 86.5 kg 85.6 kg    Intake/Output:   Intake/Output Summary (Last 24 hours) at 02/10/2024 1043 Last data filed at 02/10/2024 0724 Gross per 24 hour  Intake 995.53 ml  Output 4800 ml  Net -3804.47 ml     Physical Exam: General:  sitting up . No resp difficulty HEENT: normal Neck: supple. no JVD. Carotids 2+ bilat; no bruits. No lymphadenopathy or thryomegaly appreciated. Cor: PMI nondisplaced. Regular rate & rhythm. No rubs, gallops or murmurs. Lungs: clear Abdomen: obese soft, nontender, nondistended. No hepatosplenomegaly. No bruits or masses. Good bowel sounds. Extremities: no cyanosis, clubbing, rash, edema Neuro: alert & orientedx3, cranial nerves grossly intact. moves all 4 extremities w/o difficulty. Affect pleasant   Telemetry:Sinus 90s Personally reviewed  Labs: Basic Metabolic Panel: Recent Labs  Lab 02/06/24 0316 02/07/24 0325 02/08/24 0355 02/08/24 0826 02/08/24 0830 02/09/24 0607 02/10/24 0400  NA 128* 129* 132* 136  136 136 135 135  K 4.4 3.5 4.2 3.7  3.7 3.7 3.3* 3.3*  CL 91* 93* 93*  --   --  93* 94*  CO2 24 24 28   --   --  28 31  GLUCOSE 482* 118* 124*  --   --  310* 192*  BUN 38* 55* 57*  --   --  44* 35*  CREATININE 2.19* 2.42* 2.27*  --   --  1.77* 1.77*  CALCIUM   8.2* 8.3* 8.5*  --   --  8.3* 8.4*  MG  --   --   --   --   --  2.5* 2.1    Liver Function Tests: No results for input(s): AST, ALT, ALKPHOS, BILITOT, PROT, ALBUMIN  in the last 168 hours. Recent Labs  Lab 02/04/24 1959  LIPASE 22   No results for input(s): AMMONIA in the last 168 hours.  CBC: Recent Labs  Lab 02/06/24 0316 02/07/24 0325 02/08/24 0355 02/08/24 0826 02/08/24 0830 02/09/24 0607 02/10/24 0400  WBC 12.9* 13.1* 12.7*  --   --  10.8* 14.2*  HGB 8.7* 8.7* 8.7* 8.5*  8.8* 8.8* 9.2* 9.6*  HCT 26.7* 26.3* 27.2* 25.0*  26.0* 26.0* 27.7* 29.6*  MCV 91.8 90.1 92.2  --   --  92.0 91.6  PLT 184 237 268  --   --  287 341    Cardiac Enzymes: No results for input(s): CKTOTAL, CKMB, CKMBINDEX, TROPONINI in the last 168 hours.  BNP: BNP (last 3 results) Recent Labs    02/04/24 1959  BNP 1,264.0*    ProBNP (last 3 results) No results for input(s): PROBNP in the last 8760 hours.    Other results:  Imaging: US  EKG SITE RITE Result Date: 02/08/2024  If [employer] not attached, placement could not be confirmed due to current cardiac rhythm.    Medications:     Scheduled Medications:  aspirin  EC  81 mg Oral Daily   atorvastatin   80 mg Oral Daily   Chlorhexidine  Gluconate Cloth  6 each Topical Daily   clopidogrel   75 mg Oral Daily   DULoxetine   60 mg Oral Daily   ezetimibe   10 mg Oral Daily   fluticasone   2 spray Each Nare QHS   furosemide   80 mg Intravenous BID   gabapentin   300 mg Oral BID   heparin  injection (subcutaneous)  5,000 Units Subcutaneous Q8H   insulin  aspart  0-15 Units Subcutaneous TID WC   insulin  aspart  7 Units Subcutaneous TID WC   insulin  glargine-yfgn  22 Units Subcutaneous BID   loratadine   10 mg Oral Daily   metoprolol  succinate  25 mg Oral Daily   pantoprazole   40 mg Oral Daily   potassium chloride   40 mEq Oral Q4H   sodium chloride  flush  10-40 mL Intracatheter Q12H    Infusions:  milrinone  0.25  mcg/kg/min (02/10/24 0427)    PRN Medications: acetaminophen , alum & mag hydroxide-simeth, fentaNYL  (SUBLIMAZE ) injection, ipratropium-albuterol , ondansetron  (ZOFRAN ) IV, sodium chloride , sodium chloride  flush   Assessment/Plan:   1. Acute on chronic systolic CHF: Ischemic cardiomyopathy.  Echo this admission with EF 30%, global hypokinesis (prior echo in 2020 with normal EF).  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  Situation complicated by AKI, creatinine 1.6 at admission, peaked at 2.4 and now 2.27.   She looks volume overloaded on exam.  - On milrinone  0.25 and IV lasix  -> has diuresed 18 pounds in 2 days - CVP 4-5. Stop IV lasix  today. Switch to po torsemide tomorrow  - Decrease milrinone  to 0.125 and follow co-ox - With volume overload/low output, Toprol  XL decreased to 25 mg daily. I will stop today - Additional GDMT will depend on creatinine trend. Likely start with SGLT2 - Plan cMRI tomorrow to assess degree of scarring  2. AKI on CKD stage 3b: Creatinine initially 1.6, up to 2.4 then Lasix  held and back down to 2.27.  Suspect cardiorenal syndrome, with initial contrast bolus from CTA chest playing a role. - Scr improved to 1.77 with inotrope support and diuresis. Yeoman stable today at 1.77 - Continue to follow  3. CAD: H/o CABG x 2.  NSTEMI at presentation with HS-TnI to 2900.  Cath today, however, showed patent LIMA-LAD and SVG-PDA with unchanged native vessels (occluded RCA, occluded OMs).  No target for intervention.  - Continue ASA 81/Plavix .  - Continue atorvastatin  and Zetia .  - No s/s angina 4. PAD: Followed as outpatient by Dr. Darron.  - No rest pain  5. 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  - Focus on diuresis for now.  6. Anemia: Hgb stable 9.2 - Check Fe  studies.  7. Hypokalemia - K 3.3 - will supp    Length of Stay: 5   Toribio Fuel MD  02/10/2024, 10:43 AM  Advanced Heart Failure Team Pager [phone] (M-F; 7a - 4p)  Please contact [facility] for night-coverage after hours (4p -7a ) and weekends on [email or web address]

## 2024-02-10 NOTE — Progress Notes (Signed)
 PROGRESS NOTE    Kiara [name]  FMW:[id number] DOB: May 31, 1951 DOA: 02/04/2024 PCP: Lawrance Handing, MD     Brief Narrative:  Kiara [name] is a 73 y.o. female with medical history significant of HFpEF, CAD s/p CABG in 2014, CKD3, HTN, HLD, and DM2 p/w cp and found to have elevated troponin and BNP c/f ACS.    Pt states that she was in her usual state of health until 0800 when she awoke from her sleep w/ acute onset SOB. She endorses intermittent CP lasting 5-62min over the past few days that worsens with activities like walking/talking and improves with rest. She reports following a strict low sodium diet and denies any recent weight gain or LE edema to accompany her SOB.    In the ED, pt tachycardic and tachypneic on 4L [place]. Labs notable for K 3.2, Cr 1.65, BNP 1264, troponin 2778-->2969, and WBC 15.9. CTA chest neg for PE, but did show b/l pleural effusions. Pt started on hep gtt for ACS, cards consulted per EDP, and pt admitted to medicine for ongoing w/u.  Patient underwent heart cath 7/11.   New events last 24 hours / Subjective: UOP documented 5700 mL.  She states that she is feeling much better.  Breathing well.  Has been off oxygen since yesterday morning.  She has no complaints.  She is down 18 pounds.  Assessment & Plan:   Principal Problem:   NSTEMI (non-ST elevated myocardial infarction) (HCC) Active Problems:   Essential hypertension   Anxiety   HLD (hyperlipidemia)   Acute respiratory failure with hypoxia (HCC)   Acute on chronic combined systolic and diastolic CHF (congestive heart failure) (HCC)   ACS (acute coronary syndrome) (HCC)   Uncontrolled type 2 diabetes mellitus with hyperglycemia, with long-term current use of insulin  (HCC)   AKI (acute kidney injury) (HCC)   CKD stage 3b, GFR 30-44 ml/min (HCC)   NSTEMI - Appreciate cardiology, s/p heart cath 7/11 - no targets for PCI  - Aspirin , Plavix , lipitor     Acute on chronic systolic and diastolic heart  failure - EF 30% - Advanced heart failure team consulted, started milrinone . IV lasix  --> plans to switch to torsemide tomorrow - Planning cardiac MRI  Acute hypoxemic respiratory failure - Desatted down to as low as 66% on room air.  Required 2 L nasal cannula O2.  - Resolved. Now on room air   AKI on CKD stage IIIb - Baseline creatinine 1.7 - AKI resolved   Hyperlipidemia - Lipitor    Diabetes mellitus type 2, uncontrolled with hyperglycemia - Hemoglobin A1c 10.6 in April 2025 - Semglee , NovoLog , sliding scale insulin    Leukocytosis - Afebrile and no sign of infection.  Due to persistent leukocytosis, procalcitonin was checked.  0.39, unlikely to be a systemic infection.  UA was negative on admission.  Continue to monitor for fevers and leukocytosis for now.  Check blood culture  Hypokalemia - Replace     DVT prophylaxis: Subc hep  Code Status: Full code Family Communication: None at bedside Disposition Plan: Home  Status is: Inpatient Remains inpatient appropriate because: IV therapies    Antimicrobials:  Anti-infectives (From admission, onward)    None        Objective: Vitals:   02/10/24 0321 02/10/24 0617 02/10/24 0742 02/10/24 1151  BP: (!) 122/46  (!) 113/51 132/65  Pulse: 94  98 92  Resp: 18  16 17   Temp: 98.7 F (37.1 C)  98 F (36.7 C) 98.8 F (37.1  C)  TempSrc: Oral  Oral Oral  SpO2: 93%  95% 96%  Weight:  85.6 kg    Height:        Intake/Output Summary (Last 24 hours) at 02/10/2024 1251 Last data filed at 02/10/2024 1104 Gross per 24 hour  Intake 995.53 ml  Output 5000 ml  Net -4004.47 ml   Filed Weights   02/08/24 0359 02/09/24 0920 02/10/24 0617  Weight: 93.6 kg 86.5 kg 85.6 kg   Examination: General exam: Appears calm and comfortable  Respiratory system: Clear to auscultation. Respiratory effort normal. On room air  Cardiovascular system: S1 & S2 heard.  Gastrointestinal system: Abdomen is nondistended, soft and nontender.  Normal bowel sounds heard. Central nervous system: Alert and oriented. Non focal exam. Speech clear  Extremities: Symmetric in appearance bilaterally  Skin: No rashes, lesions or ulcers on exposed skin  Psychiatry: Judgement and insight appear stable. Mood & affect appropriate.    Data Reviewed: I have personally reviewed following labs and imaging studies  CBC: Recent Labs  Lab 02/06/24 0316 02/07/24 0325 02/08/24 0355 02/08/24 0826 02/08/24 0830 02/09/24 0607 02/10/24 0400  WBC 12.9* 13.1* 12.7*  --   --  10.8* 14.2*  HGB 8.7* 8.7* 8.7* 8.5*  8.8* 8.8* 9.2* 9.6*  HCT 26.7* 26.3* 27.2* 25.0*  26.0* 26.0* 27.7* 29.6*  MCV 91.8 90.1 92.2  --   --  92.0 91.6  PLT 184 237 268  --   --  287 341   Basic Metabolic Panel: Recent Labs  Lab 02/06/24 0316 02/07/24 0325 02/08/24 0355 02/08/24 0826 02/08/24 0830 02/09/24 0607 02/10/24 0400  NA 128* 129* 132* 136  136 136 135 135  K 4.4 3.5 4.2 3.7  3.7 3.7 3.3* 3.3*  CL 91* 93* 93*  --   --  93* 94*  CO2 24 24 28   --   --  28 31  GLUCOSE 482* 118* 124*  --   --  310* 192*  BUN 38* 55* 57*  --   --  44* 35*  CREATININE 2.19* 2.42* 2.27*  --   --  1.77* 1.77*  CALCIUM  8.2* 8.3* 8.5*  --   --  8.3* 8.4*  MG  --   --   --   --   --  2.5* 2.1   GFR: Estimated Creatinine Clearance: 30.7 mL/min (A) (by C-G formula based on SCr of 1.77 mg/dL (H)). Liver Function Tests: No results for input(s): AST, ALT, ALKPHOS, BILITOT, PROT, ALBUMIN  in the last 168 hours. Recent Labs  Lab 02/04/24 1959  LIPASE 22   No results for input(s): AMMONIA in the last 168 hours. Coagulation Profile: No results for input(s): INR, PROTIME in the last 168 hours. Cardiac Enzymes: No results for input(s): CKTOTAL, CKMB, CKMBINDEX, TROPONINI in the last 168 hours. BNP (last 3 results) No results for input(s): PROBNP in the last 8760 hours. HbA1C: No results for input(s): HGBA1C in the last 72 hours. CBG: Recent Labs   Lab 02/09/24 1132 02/09/24 1630 02/09/24 2059 02/10/24 0619 02/10/24 1149  GLUCAP 234* 240* 181* 97 292*   Lipid Profile: No results for input(s): CHOL, HDL, LDLCALC, TRIG, CHOLHDL, LDLDIRECT in the last 72 hours.  Thyroid  Function Tests: No results for input(s): TSH, T4TOTAL, FREET4, T3FREE, THYROIDAB in the last 72 hours. Anemia Panel: Recent Labs    02/10/24 0800  FERRITIN 100  TIBC 189*  IRON  30   Sepsis Labs: Recent Labs  Lab 02/10/24 0800  PROCALCITON 0.39  No results found for this or any previous visit (from the past 240 hours).    Radiology Studies: No results found.     Scheduled Meds:  aspirin  EC  81 mg Oral Daily   atorvastatin   80 mg Oral Daily   Chlorhexidine  Gluconate Cloth  6 each Topical Daily   clopidogrel   75 mg Oral Daily   DULoxetine   60 mg Oral Daily   ezetimibe   10 mg Oral Daily   fluticasone   2 spray Each Nare QHS   gabapentin   300 mg Oral BID   heparin  injection (subcutaneous)  5,000 Units Subcutaneous Q8H   insulin  aspart  0-15 Units Subcutaneous TID WC   insulin  aspart  7 Units Subcutaneous TID WC   insulin  glargine-yfgn  22 Units Subcutaneous BID   loratadine   10 mg Oral Daily   pantoprazole   40 mg Oral Daily   potassium chloride   40 mEq Oral Q4H   sodium chloride  flush  10-40 mL Intracatheter Q12H   Continuous Infusions:  milrinone  0.125 mcg/kg/min (02/10/24 1142)     LOS: 5 days   Time spent: 25 minutes   Delon Hoe, DO [facility] 02/10/2024, 12:51 PM   Available via Epic secure chat 7am-7pm After these hours, please refer to coverage provider listed on [email or web address]

## 2024-02-10 NOTE — Progress Notes (Signed)
   02/10/24 2306  BiPAP/CPAP/SIPAP  $ Non-Invasive Home Ventilator  Initial  $ Face Mask Medium Yes  BiPAP/CPAP/SIPAP Pt Type Adult  BiPAP/CPAP/SIPAP Resmed  Reason BIPAP/CPAP not in use  (RT attempted to place pt on CPAP pt did not tolerate and does not like neither the nasal/full face mask. CPAP machine has been removed.)  Mask Type Nasal mask  BiPAP/CPAP /SiPAP Vitals  Pulse Rate 86  SpO2 95 %  MEWS Score/Color  MEWS Score 0  MEWS Score Color [place]

## 2024-02-11 ENCOUNTER — Inpatient Hospital Stay (HOSPITAL_COMMUNITY)

## 2024-02-11 DIAGNOSIS — I214 Non-ST elevation (NSTEMI) myocardial infarction: Secondary | ICD-10-CM | POA: Diagnosis not present

## 2024-02-11 DIAGNOSIS — I371 Nonrheumatic pulmonary valve insufficiency: Secondary | ICD-10-CM | POA: Diagnosis not present

## 2024-02-11 DIAGNOSIS — I361 Nonrheumatic tricuspid (valve) insufficiency: Secondary | ICD-10-CM

## 2024-02-11 DIAGNOSIS — I340 Nonrheumatic mitral (valve) insufficiency: Secondary | ICD-10-CM

## 2024-02-11 LAB — CBC
HCT: 30.9 % — ABNORMAL LOW (ref 36.0–46.0)
Hemoglobin: 10 g/dL — ABNORMAL LOW (ref 12.0–15.0)
MCH: 29.9 pg (ref 26.0–34.0)
MCHC: 32.4 g/dL (ref 30.0–36.0)
MCV: 92.2 fL (ref 80.0–100.0)
Platelets: 349 K/uL (ref 150–400)
RBC: 3.35 MIL/uL — ABNORMAL LOW (ref 3.87–5.11)
RDW: 14.8 % (ref 11.5–15.5)
WBC: 16.2 K/uL — ABNORMAL HIGH (ref 4.0–10.5)
nRBC: 0 % (ref 0.0–0.2)

## 2024-02-11 LAB — COOXEMETRY PANEL
Carboxyhemoglobin: 1.2 % (ref 0.5–1.5)
Methemoglobin: <0.7 % (ref 0.0–1.5)
O2 Saturation: 60.9 %
Total hemoglobin: 13.9 g/dL (ref 12.0–16.0)

## 2024-02-11 LAB — GLUCOSE, CAPILLARY
Glucose-Capillary: 212 mg/dL — ABNORMAL HIGH (ref 70–99)
Glucose-Capillary: 242 mg/dL — ABNORMAL HIGH (ref 70–99)
Glucose-Capillary: 123 mg/dL — ABNORMAL HIGH (ref 70–99)
Glucose-Capillary: 61 mg/dL — ABNORMAL LOW (ref 70–99)
Glucose-Capillary: 120 mg/dL — ABNORMAL HIGH (ref 70–99)

## 2024-02-11 LAB — BASIC METABOLIC PANEL WITH GFR
Anion gap: 10 (ref 5–15)
BUN: 28 mg/dL — ABNORMAL HIGH (ref 8–23)
CO2: 28 mmol/L (ref 22–32)
Calcium: 8.6 mg/dL — ABNORMAL LOW (ref 8.9–10.3)
Chloride: 96 mmol/L — ABNORMAL LOW (ref 98–111)
Creatinine, Ser: 1.58 mg/dL — ABNORMAL HIGH (ref 0.44–1.00)
GFR, Estimated: 35 mL/min — ABNORMAL LOW (ref >60)
Glucose, Bld: 185 mg/dL — ABNORMAL HIGH (ref 70–99)
Potassium: 4.2 mmol/L (ref 3.5–5.1)
Sodium: 134 mmol/L — ABNORMAL LOW (ref 135–145)

## 2024-02-11 LAB — HEMOGLOBIN A1C
Hgb A1c MFr Bld: 10 % — ABNORMAL HIGH (ref 4.8–5.6)
Mean Plasma Glucose: 240 mg/dL

## 2024-02-11 LAB — MAGNESIUM: Magnesium: 2.2 mg/dL (ref 1.7–2.4)

## 2024-02-11 MED ORDER — INSULIN ASPART 100 UNIT/ML IJ SOLN
10.0000 [IU] | Freq: Three times a day (TID) | INTRAMUSCULAR | Status: DC
  Administered 2024-02-11 – 2024-02-15 (×11): 10 [IU] via SUBCUTANEOUS

## 2024-02-11 MED ORDER — INSULIN GLARGINE-YFGN 100 UNIT/ML ~~LOC~~ SOLN
14.0000 [IU] | Freq: Two times a day (BID) | SUBCUTANEOUS | Status: DC
  Administered 2024-02-11 – 2024-02-13 (×4): 14 [IU] via SUBCUTANEOUS
  Filled 2024-02-11 (×7): qty 0.14

## 2024-02-11 MED ORDER — GADOBUTROL 1 MMOL/ML IV SOLN
10.0000 mL | Freq: Once | INTRAVENOUS | Status: AC | PRN
  Administered 2024-02-11: 10 mL via INTRAVENOUS

## 2024-02-11 NOTE — Progress Notes (Signed)
 Mobility Specialist Progress Note:    02/11/24 0941  Mobility  Activity Ambulated with assistance in room;Transferred from bed to chair  Level of Assistance Modified independent, requires aide device or extra time  Assistive Device Other (Comment) (IV pole)  Distance Ambulated (ft) 75 ft  Activity Response Tolerated well  Mobility Referral Yes  Mobility visit 1 Mobility  Mobility Specialist Start Time (ACUTE ONLY) 0941  Mobility Specialist Stop Time (ACUTE ONLY) 0959  Mobility Specialist Time Calculation (min) (ACUTE ONLY) 18 min   Pt received in bed, agreeable to mobility session. Pt declined ambulating in hallway d/t needing bath and clean linens. Pt ambulated in room, ModI using IV pole. NT performed bath. Pt requested sitting up in chair. Left with all needs met, call bell in reach.    Jayvyn Haselton Mobility Specialist Please contact via Special educational needs teacher or  Rehab office at [phone]

## 2024-02-11 NOTE — Progress Notes (Signed)
 PROGRESS NOTE    Kiara [name]  FMW:[id number] DOB: [birth year] DOA: 02/04/2024 PCP: Lawrance Handing, MD     Brief Narrative:  Kiara [name] is a 73 y.o. female with medical history significant of HFpEF, CAD s/p CABG in 2014, CKD3, HTN, HLD, and DM2 p/w cp and found to have elevated troponin and BNP c/f ACS.    Pt states that she was in her usual state of health until 0800 when she awoke from her sleep w/ acute onset SOB. She endorses intermittent CP lasting 5-37min over the past few days that worsens with activities like walking/talking and improves with rest. She reports following a strict low sodium diet and denies any recent weight gain or LE edema to accompany her SOB.    In the ED, pt tachycardic and tachypneic on 4L [place]. Labs notable for K 3.2, Cr 1.65, BNP 1264, troponin 2778-->2969, and WBC 15.9. CTA chest neg for PE, but did show b/l pleural effusions. Pt started on hep gtt for ACS, cards consulted per EDP, and pt admitted to medicine for ongoing w/u.  Patient underwent heart cath 7/11.   New events last 24 hours / Subjective: UOP documented with also undocumented urine occurrences. Feeling well today without complaints. Off O2   Assessment & Plan:   Principal Problem:   NSTEMI (non-ST elevated myocardial infarction) (HCC) Active Problems:   Essential hypertension   Anxiety   HLD (hyperlipidemia)   Acute respiratory failure with hypoxia (HCC)   Acute on chronic combined systolic and diastolic CHF (congestive heart failure) (HCC)   ACS (acute coronary syndrome) (HCC)   Uncontrolled type 2 diabetes mellitus with hyperglycemia, with long-term current use of insulin  (HCC)   AKI (acute kidney injury) (HCC)   CKD stage 3b, GFR 30-44 ml/min (HCC)   NSTEMI - Appreciate cardiology, s/p heart cath 7/11 - no targets for PCI  - Aspirin , Plavix , lipitor     Acute on chronic systolic and diastolic heart failure - EF 30% - Advanced heart failure team consulted, started  milrinone  - IV lasix  --> plans to switch to torsemide tomorrow - Stop milrinone   - Cardiac MRI today  Acute hypoxemic respiratory failure - Desatted down to as low as 66% on room air.  Required 2 L nasal cannula O2.  - Resolved. Now on room air   AKI on CKD stage IIIb - Baseline creatinine 1.7 - AKI resolved   Hyperlipidemia - Lipitor    Diabetes mellitus type 2, uncontrolled with hyperglycemia - Hemoglobin A1c 10.6 in April 2025 - Semglee , NovoLog , sliding scale insulin    Leukocytosis - Afebrile and no sign of infection.  Due to persistent leukocytosis, procalcitonin was checked.  0.39, unlikely to be a systemic infection.  UA was negative on admission.  Continue to monitor for fevers and leukocytosis for now.  Check blood culture. Repeat procalcitonin tomorrow    DVT prophylaxis: Subc hep  Code Status: Full code Family Communication: None at bedside Disposition Plan: Home  Status is: Inpatient Remains inpatient appropriate because: IV therapies    Antimicrobials:  Anti-infectives (From admission, onward)    None        Objective: Vitals:   02/10/24 2306 02/10/24 2310 02/11/24 0514 02/11/24 0815  BP:  (!) 132/53 (!) 143/61 102/89  Pulse: 86 85 82 88  Resp:  16 16 20   Temp:  98.4 F (36.9 C) 98.3 F (36.8 C) 98.3 F (36.8 C)  TempSrc:  Oral Oral Oral  SpO2: 95% 95% 93%   Weight:  Height:        Intake/Output Summary (Last 24 hours) at 02/11/2024 1118 Last data filed at 02/11/2024 0553 Gross per 24 hour  Intake 591.35 ml  Output 1650 ml  Net -1058.65 ml   Filed Weights   02/08/24 0359 02/09/24 0920 02/10/24 0617  Weight: 93.6 kg 86.5 kg 85.6 kg   Examination: General exam: Appears calm and comfortable  Respiratory system: Clear to auscultation. Respiratory effort normal. On room air  Cardiovascular system: S1 & S2 heard.  Gastrointestinal system: Abdomen is nondistended, soft and nontender. Normal bowel sounds heard. Central nervous system:  Alert and oriented. Non focal exam. Speech clear  Extremities: Symmetric in appearance bilaterally  Skin: No rashes, lesions or ulcers on exposed skin  Psychiatry: Judgement and insight appear stable. Mood & affect appropriate.    Data Reviewed: I have personally reviewed following labs and imaging studies  CBC: Recent Labs  Lab 02/07/24 0325 02/08/24 0355 02/08/24 0826 02/08/24 0830 02/09/24 0607 02/10/24 0400 02/11/24 0515  WBC 13.1* 12.7*  --   --  10.8* 14.2* 16.2*  HGB 8.7* 8.7* 8.5*  8.8* 8.8* 9.2* 9.6* 10.0*  HCT 26.3* 27.2* 25.0*  26.0* 26.0* 27.7* 29.6* 30.9*  MCV 90.1 92.2  --   --  92.0 91.6 92.2  PLT 237 268  --   --  287 341 349   Basic Metabolic Panel: Recent Labs  Lab 02/07/24 0325 02/08/24 0355 02/08/24 0826 02/08/24 0830 02/09/24 0607 02/10/24 0400 02/11/24 0515  NA 129* 132* 136  136 136 135 135 134*  K 3.5 4.2 3.7  3.7 3.7 3.3* 3.3* 4.2  CL 93* 93*  --   --  93* 94* 96*  CO2 24 28  --   --  28 31 28   GLUCOSE 118* 124*  --   --  310* 192* 185*  BUN 55* 57*  --   --  44* 35* 28*  CREATININE 2.42* 2.27*  --   --  1.77* 1.77* 1.58*  CALCIUM  8.3* 8.5*  --   --  8.3* 8.4* 8.6*  MG  --   --   --   --  2.5* 2.1 2.2   GFR: Estimated Creatinine Clearance: 34.4 mL/min (A) (by C-G formula based on SCr of 1.58 mg/dL (H)). Liver Function Tests: No results for input(s): AST, ALT, ALKPHOS, BILITOT, PROT, ALBUMIN  in the last 168 hours. Recent Labs  Lab 02/04/24 1959  LIPASE 22   No results for input(s): AMMONIA in the last 168 hours. Coagulation Profile: No results for input(s): INR, PROTIME in the last 168 hours. Cardiac Enzymes: No results for input(s): CKTOTAL, CKMB, CKMBINDEX, TROPONINI in the last 168 hours. BNP (last 3 results) No results for input(s): PROBNP in the last 8760 hours. HbA1C: Recent Labs    02/10/24 0400  HGBA1C 10.0*   CBG: Recent Labs  Lab 02/10/24 0619 02/10/24 1149 02/10/24 1627  02/10/24 2042 02/11/24 0815  GLUCAP 97 292* 234* 138* 212*   Lipid Profile: No results for input(s): CHOL, HDL, LDLCALC, TRIG, CHOLHDL, LDLDIRECT in the last 72 hours.  Thyroid  Function Tests: No results for input(s): TSH, T4TOTAL, FREET4, T3FREE, THYROIDAB in the last 72 hours. Anemia Panel: Recent Labs    02/10/24 0800  FERRITIN 100  TIBC 189*  IRON  30   Sepsis Labs: Recent Labs  Lab 02/10/24 0800  PROCALCITON 0.39    Recent Results (from the past 240 hours)  Culture, blood (Routine X 2) w Reflex to ID Panel  Status: None (Preliminary result)   Collection Time: 02/10/24  2:59 PM   Specimen: BLOOD LEFT HAND  Result Value Ref Range Status   Specimen Description BLOOD LEFT HAND  Final   Special Requests   Final    BOTTLES DRAWN AEROBIC ONLY Blood Culture results may not be optimal due to an inadequate volume of blood received in culture bottles   Culture   Final    NO GROWTH < 24 HOURS Performed at [facility], [address] [place], [place] [address]    Report Status PENDING  Incomplete  Culture, blood (Routine X 2) w Reflex to ID Panel     Status: None (Preliminary result)   Collection Time: 02/10/24  2:59 PM   Specimen: BLOOD LEFT HAND  Result Value Ref Range Status   Specimen Description BLOOD LEFT HAND  Final   Special Requests   Final    BOTTLES DRAWN AEROBIC ONLY Blood Culture results may not be optimal due to an inadequate volume of blood received in culture bottles   Culture   Final    NO GROWTH < 24 HOURS Performed at [facility], [address] [place], [place] [address]    Report Status PENDING  Incomplete      Radiology Studies: MR CARDIAC MORPHOLOGY W WO CONTRAST Result Date: 02/11/2024 CLINICAL DATA:  Cardiomyopathy, undefined, further testing COMPARISON: Echo 02/05/24 EXAM: MR CARDIA MORPHOLOGY WITHOUT AND WITH CONTRAST; MR CARDIAC VELOCITY FLOW MAPPING TECHNIQUE: The patient was scanned on a 1.5 Tesla  Siemens magnet. A dedicated cardiac coil was used. Functional imaging was done using TrueFisp sequences. 2,3, and 4 chamber views were done to assess for RWMA's. Modified Simpson's rule using a short axis stack was used to calculate an ejection fraction on a dedicated work [employer]. The patient received 10mL GADAVIST  GADOBUTROL  1 MMOL/ML IV SOLN. After 10 minutes inversion recovery sequences were used to assess for infiltration and scar tissue. Phase contrast velocity encoded images obtained x 2. This examination is tailored for evaluation cardiac anatomy and function and provides very limited assessment of noncardiac structures, which are accordingly not evaluated during interpretation. If there is clinical concern for extracardiac pathology, further evaluation with CT imaging should be considered. FINDINGS: LEFT VENTRICLE: Left ventricular chamber size: Normal by indexed volume. Left ventricular wall thickness: Mildly increased. Maximal wall thickness 13 mm. Left ventricular systolic function: Moderately reduced LVEF = 34% There are regional wall motion abnormalities: Mid to apical anterior akinesis. No myocardial edema, T2 = 52 msec Abnormal first pass perfusion, anterior and lateral wall infarct. There is post contrast delayed myocardial enhancement: Mid to apical anterior wall subendocardial LGE, <50% myocardial thickness suggesting potential viability. Subendocardial basal-apical lateral wall LGE, <50% myocardial thickness suggesting potential viability. Subendocardial mid ventricular inferoseptal LGE that extends to the apex and is >50% myocardial thickness suggesting no viability, but is focal at the inferoseptum/inferior RV insertion point. Subendocardial apical inferior wall, <50% myocardial thickness, suggesting potential viability. Normal T1 myocardial nulling kinetics suggest against a diagnosis of cardiac amyloidosis. ECV = 30%, nonspecific elevation. RIGHT VENTRICLE: Normal right  ventricular chamber size. Normal right ventricular wall thickness. Mildly reduced right ventricular systolic function. RVEF = 48% Mild global hypokinesis. No post contrast delayed myocardial enhancement. ATRIA: Grossly normal. PERICARDIUM: Normal pericardium.  Trivial pericardial effusion. OTHER: Small bilateral pleural effusions. MEASUREMENTS: Qp/Qs: 0.7 VALVES: Tricuspid aortic valve. Aortic valve regurgitation: Trivial, regurgitant fraction 4% Pulmonary valve regurgitation: Moderate, regurgitant fraction 20% Mitral valve regurgitation:  Mild, regurgitant fraction 10% Tricuspid valve regurgitation: Mild, regurgitant fraction 17% Left ventricle: LV female LV EF: 34 % (Normal 52-79%) Absolute volumes: LV EDV: (Normal 78-167 mL) LV ESV: (Normal 21-64 mL) LV SV: 58mL (Normal 52-114 mL) CO: 4.9L/min (Normal 2.7-6.3 L/min) Indexed volumes: CI: 2.51L/min/sq-m (Normal 1.9-3.9 L/min/sq-m) LV EDV: 80mL/sq-m (Normal 50-96 mL/sq-m) LV ESV: 42mL/sq-m (Normal 10-40 mL/sq-m) LV SV: 41mL/sq-m (Normal 33-64 mL/sq-m) Right ventricle: RV female RV EF: 48% (normal 52-80%) Absolute volumes: RV EDV: (Normal 79-175 mL) RV ESV: 55mL (Normal 13-75 mL) RV SV: 51mL (Normal 56-110 mL) CO: 4.3L/min (Normal 2.7-6 L/min) Indexed volumes: CI: 2.2L/min/sq-m (Normal 1.8-3.8 L/min/sq-m) RV EDV: 79mL/sq-m (Normal 51-97 mL/sq-m) RV ESV: 66mL/sq-m (Normal 9-42 mL/sq-m) RV SV: 70mL/sq-m (Normal 35-61 mL/sq-m) IMPRESSION: 1. Moderately reduced LV systolic function with grossly normal LV chamber size. LVEF 34%. There is subendocardial scar in the basal to apical lateral, mid to apical anterior, and apical inferior walls with potential viability, with focal transmural likely nonviable myocardium at the mid-apical inferoseptum. Findings in combination suggest ischemic cardiomyopathy. 2. Mildly reduced RV systolic function with normal RV chamber size, RVEF 48%. No delayed myocardial enhancement. 3. Moderate pulmonary valve regurgitation,  mild mitral and tricuspid valve regurgitation. Electronically Signed   By: Soyla Merck M.D.   On: 02/11/2024 10:07   MR CARDIAC VELOCITY FLOW MAP Result Date: 02/11/2024 CLINICAL DATA:  Cardiomyopathy, undefined, further testing COMPARISON: Echo 02/05/24 EXAM: MR CARDIA MORPHOLOGY WITHOUT AND WITH CONTRAST; MR CARDIAC VELOCITY FLOW MAPPING TECHNIQUE: The patient was scanned on a 1.5 Tesla Siemens magnet. A dedicated cardiac coil was used. Functional imaging was done using TrueFisp sequences. 2,3, and 4 chamber views were done to assess for RWMA's. Modified Simpson's rule using a short axis stack was used to calculate an ejection fraction on a dedicated work [employer]. The patient received 10mL GADAVIST  GADOBUTROL  1 MMOL/ML IV SOLN. After 10 minutes inversion recovery sequences were used to assess for infiltration and scar tissue. Phase contrast velocity encoded images obtained x 2. This examination is tailored for evaluation cardiac anatomy and function and provides very limited assessment of noncardiac structures, which are accordingly not evaluated during interpretation. If there is clinical concern for extracardiac pathology, further evaluation with CT imaging should be considered. FINDINGS: LEFT VENTRICLE: Left ventricular chamber size: Normal by indexed volume. Left ventricular wall thickness: Mildly increased. Maximal wall thickness 13 mm. Left ventricular systolic function: Moderately reduced LVEF = 34% There are regional wall motion abnormalities: Mid to apical anterior akinesis. No myocardial edema, T2 = 52 msec Abnormal first pass perfusion, anterior and lateral wall infarct. There is post contrast delayed myocardial enhancement: Mid to apical anterior wall subendocardial LGE, <50% myocardial thickness suggesting potential viability. Subendocardial basal-apical lateral wall LGE, <50% myocardial thickness suggesting potential viability. Subendocardial mid ventricular inferoseptal  LGE that extends to the apex and is >50% myocardial thickness suggesting no viability, but is focal at the inferoseptum/inferior RV insertion point. Subendocardial apical inferior wall, <50% myocardial thickness, suggesting potential viability. Normal T1 myocardial nulling kinetics suggest against a diagnosis of cardiac amyloidosis. ECV = 30%, nonspecific elevation. RIGHT VENTRICLE: Normal right ventricular chamber size. Normal right ventricular wall thickness. Mildly reduced right ventricular systolic function. RVEF = 48% Mild global hypokinesis. No post contrast delayed myocardial enhancement. ATRIA: Grossly normal. PERICARDIUM: Normal pericardium.  Trivial pericardial effusion. OTHER: Small bilateral pleural effusions. MEASUREMENTS: Qp/Qs: 0.7 VALVES: Tricuspid aortic valve. Aortic valve regurgitation: Trivial, regurgitant fraction 4% Pulmonary valve regurgitation: Moderate, regurgitant fraction 20%  Mitral valve regurgitation: Mild, regurgitant fraction 10% Tricuspid valve regurgitation: Mild, regurgitant fraction 17% Left ventricle: LV female LV EF: 34 % (Normal 52-79%) Absolute volumes: LV EDV: (Normal 78-167 mL) LV ESV: (Normal 21-64 mL) LV SV: 58mL (Normal 52-114 mL) CO: 4.9L/min (Normal 2.7-6.3 L/min) Indexed volumes: CI: 2.51L/min/sq-m (Normal 1.9-3.9 L/min/sq-m) LV EDV: 41mL/sq-m (Normal 50-96 mL/sq-m) LV ESV: 60mL/sq-m (Normal 10-40 mL/sq-m) LV SV: 56mL/sq-m (Normal 33-64 mL/sq-m) Right ventricle: RV female RV EF: 48% (normal 52-80%) Absolute volumes: RV EDV: (Normal 79-175 mL) RV ESV: 55mL (Normal 13-75 mL) RV SV: 51mL (Normal 56-110 mL) CO: 4.3L/min (Normal 2.7-6 L/min) Indexed volumes: CI: 2.2L/min/sq-m (Normal 1.8-3.8 L/min/sq-m) RV EDV: 55mL/sq-m (Normal 51-97 mL/sq-m) RV ESV: 12mL/sq-m (Normal 9-42 mL/sq-m) RV SV: 2mL/sq-m (Normal 35-61 mL/sq-m) IMPRESSION: 1. Moderately reduced LV systolic function with grossly normal LV chamber size. LVEF 34%. There is subendocardial scar in the  basal to apical lateral, mid to apical anterior, and apical inferior walls with potential viability, with focal transmural likely nonviable myocardium at the mid-apical inferoseptum. Findings in combination suggest ischemic cardiomyopathy. 2. Mildly reduced RV systolic function with normal RV chamber size, RVEF 48%. No delayed myocardial enhancement. 3. Moderate pulmonary valve regurgitation, mild mitral and tricuspid valve regurgitation. Electronically Signed   By: Soyla Merck M.D.   On: 02/11/2024 10:07   MR CARDIAC VELOCITY FLOW MAP Result Date: 02/11/2024 CLINICAL DATA:  Cardiomyopathy, undefined, further testing COMPARISON: Echo 02/05/24 EXAM: MR CARDIA MORPHOLOGY WITHOUT AND WITH CONTRAST; MR CARDIAC VELOCITY FLOW MAPPING TECHNIQUE: The patient was scanned on a 1.5 Tesla Siemens magnet. A dedicated cardiac coil was used. Functional imaging was done using TrueFisp sequences. 2,3, and 4 chamber views were done to assess for RWMA's. Modified Simpson's rule using a short axis stack was used to calculate an ejection fraction on a dedicated work [employer], [employer]. The patient received 10mL GADAVIST  GADOBUTROL  1 MMOL/ML IV SOLN. After 10 minutes inversion recovery sequences were used to assess for infiltration and scar tissue. Phase contrast velocity encoded images obtained x 2. This examination is tailored for evaluation cardiac anatomy and function and provides very limited assessment of noncardiac structures, which are accordingly not evaluated during interpretation. If there is clinical concern for extracardiac pathology, further evaluation with CT imaging should be considered. FINDINGS: LEFT VENTRICLE: Left ventricular chamber size: Normal by indexed volume. Left ventricular wall thickness: Mildly increased. Maximal wall thickness 13 mm. Left ventricular systolic function: Moderately reduced LVEF = 34% There are regional wall motion abnormalities: Mid to apical anterior akinesis. No myocardial  edema, T2 = 52 msec Abnormal first pass perfusion, anterior and lateral wall infarct. There is post contrast delayed myocardial enhancement: Mid to apical anterior wall subendocardial LGE, <50% myocardial thickness suggesting potential viability. Subendocardial basal-apical lateral wall LGE, <50% myocardial thickness suggesting potential viability. Subendocardial mid ventricular inferoseptal LGE that extends to the apex and is >50% myocardial thickness suggesting no viability, but is focal at the inferoseptum/inferior RV insertion point. Subendocardial apical inferior wall, <50% myocardial thickness, suggesting potential viability. Normal T1 myocardial nulling kinetics suggest against a diagnosis of cardiac amyloidosis. ECV = 30%, nonspecific elevation. RIGHT VENTRICLE: Normal right ventricular chamber size. Normal right ventricular wall thickness. Mildly reduced right ventricular systolic function. RVEF = 48% Mild global hypokinesis. No post contrast delayed myocardial enhancement. ATRIA: Grossly normal. PERICARDIUM: Normal pericardium.  Trivial pericardial effusion. OTHER: Small bilateral pleural effusions. MEASUREMENTS: Qp/Qs: 0.7 VALVES: Tricuspid aortic valve. Aortic valve regurgitation: Trivial, regurgitant fraction 4% Pulmonary valve regurgitation: Moderate,  regurgitant fraction 20% Mitral valve regurgitation: Mild, regurgitant fraction 10% Tricuspid valve regurgitation: Mild, regurgitant fraction 17% Left ventricle: LV female LV EF: 34 % (Normal 52-79%) Absolute volumes: LV EDV: (Normal 78-167 mL) LV ESV: (Normal 21-64 mL) LV SV: 58mL (Normal 52-114 mL) CO: 4.9L/min (Normal 2.7-6.3 L/min) Indexed volumes: CI: 2.51L/min/sq-m (Normal 1.9-3.9 L/min/sq-m) LV EDV: 6mL/sq-m (Normal 50-96 mL/sq-m) LV ESV: 14mL/sq-m (Normal 10-40 mL/sq-m) LV SV: 62mL/sq-m (Normal 33-64 mL/sq-m) Right ventricle: RV female RV EF: 48% (normal 52-80%) Absolute volumes: RV EDV: (Normal 79-175 mL) RV ESV: 55mL (Normal  13-75 mL) RV SV: 51mL (Normal 56-110 mL) CO: 4.3L/min (Normal 2.7-6 L/min) Indexed volumes: CI: 2.2L/min/sq-m (Normal 1.8-3.8 L/min/sq-m) RV EDV: 26mL/sq-m (Normal 51-97 mL/sq-m) RV ESV: 8mL/sq-m (Normal 9-42 mL/sq-m) RV SV: 91mL/sq-m (Normal 35-61 mL/sq-m) IMPRESSION: 1. Moderately reduced LV systolic function with grossly normal LV chamber size. LVEF 34%. There is subendocardial scar in the basal to apical lateral, mid to apical anterior, and apical inferior walls with potential viability, with focal transmural likely nonviable myocardium at the mid-apical inferoseptum. Findings in combination suggest ischemic cardiomyopathy. 2. Mildly reduced RV systolic function with normal RV chamber size, RVEF 48%. No delayed myocardial enhancement. 3. Moderate pulmonary valve regurgitation, mild mitral and tricuspid valve regurgitation. Electronically Signed   By: Soyla Merck M.D.   On: 02/11/2024 10:07       Scheduled Meds:  aspirin  EC  81 mg Oral Daily   atorvastatin   80 mg Oral Daily   Chlorhexidine  Gluconate Cloth  6 each Topical Daily   clopidogrel   75 mg Oral Daily   DULoxetine   60 mg Oral Daily   ezetimibe   10 mg Oral Daily   fluticasone   2 spray Each Nare QHS   gabapentin   300 mg Oral BID   heparin  injection (subcutaneous)  5,000 Units Subcutaneous Q8H   insulin  aspart  0-15 Units Subcutaneous TID WC   insulin  aspart  10 Units Subcutaneous TID WC   insulin  glargine-yfgn  22 Units Subcutaneous BID   loratadine   10 mg Oral Daily   pantoprazole   40 mg Oral Daily   sodium chloride  flush  10-40 mL Intracatheter Q12H   Continuous Infusions:     LOS: 6 days   Time spent: 25 minutes   Delon Hoe, DO [facility] 02/11/2024, 11:18 AM   Available via Epic secure chat 7am-7pm After these hours, please refer to coverage provider listed on [email or web address]

## 2024-02-11 NOTE — Progress Notes (Signed)
 [facility] night cross cover note:   I was notified by the patient's RN  of patient's evening CBG in the 60s, asymptomatic.  She is currently due for her next dose of Semglee  22 units SQ twice daily.  It appears that while on the 22 units of evening Semglee , that her fasting morning blood sugars have remained high.   For now, I have decreased her existing order of Semglee  22 units SQ twice daily to 14 units SQ twice daily, but with anticipation that she will likely require a relative increase in the evening dose of her Semglee  relative to the morning dose. Will further assess via following for next fasting AM CBG result.     Eva Pore, DO Hospitalist

## 2024-02-11 NOTE — Progress Notes (Signed)
 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 Preferred holding to pole, CGA.Return to recliner, VSS.  Discussed with pt MI, HF management,  restrictions, diet, exercise guidelines,  and CRPII. Gave pt HF booklet and encouraged daily wts. She is already following a low sodium diet. Pt receptive. Will refer to [facility].  [phone]  Aliene Aris BS, ACSM-CEP 02/11/2024 10:49 AM

## 2024-02-11 NOTE — Progress Notes (Signed)
 External labs from [facility] at [facility] entered. Marko FORBES Solian, HAA 02/11/2024 6:32 AM

## 2024-02-11 NOTE — Care Management Important Message (Signed)
 Important Message  Patient Details  Name: Kiara [name] MRN: [id number] Date of Birth: [birth year]   Important Message Given:  Yes - Medicare IM     Claretta Deed 02/11/2024, 3:47 PM

## 2024-02-11 NOTE — Progress Notes (Signed)
 Hypoglycemic Event  CBG: 61  Treatment: 4 oz juice/soda  Symptoms: None  Follow-up CBG Time: 21:41 CBG Result:120  Possible Reasons for Event: Medication regimen- pt on insulin  pump at home    Comments/MD notified: Notified Eva Pore for guidance on long acting insulin .  Received new orders for reduced dose of Semglee .     Kiara [name]

## 2024-02-11 NOTE — Inpatient Diabetes Management (Signed)
 Inpatient Diabetes Program Recommendations  AACE/ADA: New Consensus Statement on Inpatient Glycemic Control (2015)  Target Ranges:  Prepandial:   less than 140 mg/dL      Peak postprandial:   less than 180 mg/dL (1-2 hours)      Critically ill patients:  140 - 180 mg/dL   Lab Results  Component Value Date   GLUCAP 212 (H) 02/11/2024   HGBA1C 10.0 (H) 02/10/2024    Review of Glycemic Control  Latest Reference Range & Units 02/09/24 20:59 02/10/24 06:19 02/10/24 11:49 02/10/24 16:27 02/10/24 20:42 02/11/24 08:15  Glucose-Capillary 70 - 99 mg/dL 818 (H) 97 707 (H) 765 (H) 138 (H) 212 (H)  (H): Data is abnormally high Diabetes history: DM1 (Requires basal, meal coverage and correction insulin ) Outpatient Diabetes medications: Medtronic Insulin  Pump Current orders for Inpatient glycemic control: Novolog  0-15 tid correction   Inpatient Diabetes Program Recommendations:  Consider further increasing Novolog  10 units TID (assuming patient consuming >50% of meals)  Thanks, Tinnie Minus, MSN, RNC-OB Diabetes Coordinator [phone] (8a-5p)

## 2024-02-11 NOTE — Progress Notes (Signed)
 Advanced Heart Failure Rounding Note   Subjective:     Milrinone  turned down to 0.125 yesterday. IV lasix  stopped.   Co-ox 61% SCr 1.77 -> 1.58   Denies CP or SOB   Plan for cMRI today  Objective:   Weight Range:  Vital Signs:   Temp:  [98 F (36.7 C)-98.8 F (37.1 C)] 98.3 F (36.8 C) (07/14 0514) Pulse Rate:  [82-92] 82 (07/14 0514) Resp:  [16-18] 16 (07/14 0514) BP: (102-143)/(46-89) 143/61 (07/14 0514) SpO2:  [93 %-96 %] 93 % (07/14 0514) Last BM Date : 02/10/24  Weight change: Filed Weights   02/08/24 0359 02/09/24 0920 02/10/24 0617  Weight: 93.6 kg 86.5 kg 85.6 kg    Intake/Output:   Intake/Output Summary (Last 24 hours) at 02/11/2024 0759 Last data filed at 02/11/2024 0553 Gross per 24 hour  Intake 591.35 ml  Output 2300 ml  Net -1708.65 ml     Physical Exam: General:  Well appearing. No resp difficulty HEENT: normal Neck: supple. no JVD. Carotids 2+ bilat; no bruits. No lymphadenopathy or thryomegaly appreciated. Cor: PMI nondisplaced. Regular rate & rhythm. No rubs, gallops or murmurs. Lungs: clear Abdomen: soft, nontender, nondistended. No hepatosplenomegaly. No bruits or masses. Good bowel sounds. Extremities: no cyanosis, clubbing, rash, edema Neuro: alert & orientedx3, cranial nerves grossly intact. moves all 4 extremities w/o difficulty. Affect pleasant  Telemetry:Sinus 80-90s Personally reviewed  Labs: Basic Metabolic Panel: Recent Labs  Lab 02/07/24 0325 02/08/24 0355 02/08/24 0826 02/08/24 0830 02/09/24 0607 02/10/24 0400 02/11/24 0515  NA 129* 132* 136  136 136 135 135 134*  K 3.5 4.2 3.7  3.7 3.7 3.3* 3.3* 4.2  CL 93* 93*  --   --  93* 94* 96*  CO2 24 28  --   --  28 31 28   GLUCOSE 118* 124*  --   --  310* 192* 185*  BUN 55* 57*  --   --  44* 35* 28*  CREATININE 2.42* 2.27*  --   --  1.77* 1.77* 1.58*  CALCIUM  8.3* 8.5*  --   --  8.3* 8.4* 8.6*  MG  --   --   --   --  2.5* 2.1 2.2    Liver Function Tests: No  results for input(s): AST, ALT, ALKPHOS, BILITOT, PROT, ALBUMIN  in the last 168 hours. Recent Labs  Lab 02/04/24 1959  LIPASE 22   No results for input(s): AMMONIA in the last 168 hours.  CBC: Recent Labs  Lab 02/07/24 0325 02/08/24 0355 02/08/24 0826 02/08/24 0830 02/09/24 0607 02/10/24 0400 02/11/24 0515  WBC 13.1* 12.7*  --   --  10.8* 14.2* 16.2*  HGB 8.7* 8.7* 8.5*  8.8* 8.8* 9.2* 9.6* 10.0*  HCT 26.3* 27.2* 25.0*  26.0* 26.0* 27.7* 29.6* 30.9*  MCV 90.1 92.2  --   --  92.0 91.6 92.2  PLT 237 268  --   --  287 341 349    Cardiac Enzymes: No results for input(s): CKTOTAL, CKMB, CKMBINDEX, TROPONINI in the last 168 hours.  BNP: BNP (last 3 results) Recent Labs    02/04/24 1959  BNP 1,264.0*    ProBNP (last 3 results) No results for input(s): PROBNP in the last 8760 hours.    Other results:  Imaging: No results found.    Medications:     Scheduled Medications:  aspirin  EC  81 mg Oral Daily   atorvastatin   80 mg Oral Daily   Chlorhexidine  Gluconate Cloth  6 each  Topical Daily   clopidogrel   75 mg Oral Daily   DULoxetine   60 mg Oral Daily   ezetimibe   10 mg Oral Daily   fluticasone   2 spray Each Nare QHS   gabapentin   300 mg Oral BID   heparin  injection (subcutaneous)  5,000 Units Subcutaneous Q8H   insulin  aspart  0-15 Units Subcutaneous TID WC   insulin  aspart  7 Units Subcutaneous TID WC   insulin  glargine-yfgn  22 Units Subcutaneous BID   loratadine   10 mg Oral Daily   pantoprazole   40 mg Oral Daily   sodium chloride  flush  10-40 mL Intracatheter Q12H    Infusions:  milrinone  0.125 mcg/kg/min (02/11/24 0553)    PRN Medications: acetaminophen , alum & mag hydroxide-simeth, fentaNYL  (SUBLIMAZE ) injection, ipratropium-albuterol , ondansetron  (ZOFRAN ) IV, sodium chloride , sodium chloride  flush   Assessment/Plan:   1. Acute on chronic systolic CHF: Ischemic cardiomyopathy.  Echo this admission with EF 30%, global  hypokinesis (prior echo in 2020 with normal EF).  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  Situation complicated by AKI, creatinine 1.6 at admission, peaked at 2.4 and now 2.27.   She looks volume overloaded on exam.  - On milrinone  0.125  - CVP remains low. Hold diuretics today. Start torsemide and/or SGLT2i  tomorrow  - CO-ox 61% Stop milrinone  and follow co-ox & clinical picture - With volume overload/low output, Toprol  XL stopped - Additional GDMT will depend on creatinine trend. Likely start with SGLT2 - Plan cMRI today to assess degree of scarring  2. AKI on CKD stage 3b: Creatinine initially 1.6, up to 2.4 then Lasix  held and back down to 2.27.  Suspect cardiorenal syndrome, with initial contrast bolus from CTA chest playing a role. - Scr improved to 1.77 with inotrope support and diuresis. SCr improved today at 1.58 - SGLT2i prior to d/c - Continue to follow  3. CAD: H/o CABG x 2.  NSTEMI at presentation with HS-TnI to 2900.  Cath today, however, showed patent LIMA-LAD and SVG-PDA with unchanged native vessels (occluded RCA, occluded OMs).  No target for intervention.  - Continue ASA 81/Plavix .  - Continue atorvastatin  and Zetia .  - No s/s angina 4. PAD: Followed as outpatient by Dr. Darron.  - No rest pain  5. 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  - Improved with diuresis. Outpatient f/u 6. Anemia: Hgb improved at 10.0 - Check Fe studies.  7. Hypokalemia - K 4.2   Length of Stay: 6   Zac Torti MD  02/11/2024, 7:59 AM  Advanced Heart Failure Team Pager [phone] (M-F; 7a - 4p)  Please contact [facility] for night-coverage after hours (4p -7a ) and weekends on [email or web address]

## 2024-02-12 ENCOUNTER — Other Ambulatory Visit (HOSPITAL_COMMUNITY): Payer: Self-pay

## 2024-02-12 ENCOUNTER — Telehealth (HOSPITAL_COMMUNITY): Payer: Self-pay | Admitting: Pharmacy Technician

## 2024-02-12 DIAGNOSIS — Z951 Presence of aortocoronary bypass graft: Secondary | ICD-10-CM

## 2024-02-12 DIAGNOSIS — I214 Non-ST elevation (NSTEMI) myocardial infarction: Secondary | ICD-10-CM | POA: Diagnosis not present

## 2024-02-12 DIAGNOSIS — I130 Hypertensive heart and chronic kidney disease with heart failure and stage 1 through stage 4 chronic kidney disease, or unspecified chronic kidney disease: Secondary | ICD-10-CM

## 2024-02-12 DIAGNOSIS — M316 Other giant cell arteritis: Secondary | ICD-10-CM

## 2024-02-12 DIAGNOSIS — N183 Chronic kidney disease, stage 3 unspecified: Secondary | ICD-10-CM

## 2024-02-12 DIAGNOSIS — I5040 Unspecified combined systolic (congestive) and diastolic (congestive) heart failure: Secondary | ICD-10-CM

## 2024-02-12 DIAGNOSIS — E1122 Type 2 diabetes mellitus with diabetic chronic kidney disease: Secondary | ICD-10-CM

## 2024-02-12 DIAGNOSIS — I6529 Occlusion and stenosis of unspecified carotid artery: Secondary | ICD-10-CM

## 2024-02-12 DIAGNOSIS — E1159 Type 2 diabetes mellitus with other circulatory complications: Secondary | ICD-10-CM

## 2024-02-12 LAB — GLUCOSE, CAPILLARY
Glucose-Capillary: 267 mg/dL — ABNORMAL HIGH (ref 70–99)
Glucose-Capillary: 188 mg/dL — ABNORMAL HIGH (ref 70–99)
Glucose-Capillary: 196 mg/dL — ABNORMAL HIGH (ref 70–99)
Glucose-Capillary: 160 mg/dL — ABNORMAL HIGH (ref 70–99)

## 2024-02-12 LAB — BASIC METABOLIC PANEL WITH GFR
Anion gap: 12 (ref 5–15)
BUN: 25 mg/dL — ABNORMAL HIGH (ref 8–23)
CO2: 26 mmol/L (ref 22–32)
Calcium: 8.9 mg/dL (ref 8.9–10.3)
Chloride: 98 mmol/L (ref 98–111)
Creatinine, Ser: 1.42 mg/dL — ABNORMAL HIGH (ref 0.44–1.00)
GFR, Estimated: 40 mL/min — ABNORMAL LOW (ref >60)
Glucose, Bld: 240 mg/dL — ABNORMAL HIGH (ref 70–99)
Potassium: 4.3 mmol/L (ref 3.5–5.1)
Sodium: 136 mmol/L (ref 135–145)

## 2024-02-12 LAB — CBC
HCT: 32.1 % — ABNORMAL LOW (ref 36.0–46.0)
Hemoglobin: 10.2 g/dL — ABNORMAL LOW (ref 12.0–15.0)
MCH: 29.7 pg (ref 26.0–34.0)
MCHC: 31.8 g/dL (ref 30.0–36.0)
MCV: 93.3 fL (ref 80.0–100.0)
Platelets: 376 K/uL (ref 150–400)
RBC: 3.44 MIL/uL — ABNORMAL LOW (ref 3.87–5.11)
RDW: 14.8 % (ref 11.5–15.5)
WBC: 16.1 K/uL — ABNORMAL HIGH (ref 4.0–10.5)
nRBC: 0 % (ref 0.0–0.2)

## 2024-02-12 LAB — C-REACTIVE PROTEIN: CRP: 4.3 mg/dL — ABNORMAL HIGH (ref <1.0)

## 2024-02-12 LAB — SEDIMENTATION RATE: Sed Rate: 121 mm/h — ABNORMAL HIGH (ref 0–22)

## 2024-02-12 LAB — COOXEMETRY PANEL
Carboxyhemoglobin: 2.6 % — ABNORMAL HIGH (ref 0.5–1.5)
Methemoglobin: 0.8 % (ref 0.0–1.5)
O2 Saturation: 69.3 %
Total hemoglobin: 10.1 g/dL — ABNORMAL LOW (ref 12.0–16.0)

## 2024-02-12 LAB — PROCALCITONIN: Procalcitonin: 0.11 ng/mL

## 2024-02-12 LAB — MAGNESIUM: Magnesium: 2.4 mg/dL (ref 1.7–2.4)

## 2024-02-12 MED ORDER — IRON SUCROSE 200 MG IVPB - SIMPLE MED
200.0000 mg | Freq: Once | Status: DC
  Filled 2024-02-12: qty 110

## 2024-02-12 MED ORDER — SACUBITRIL-VALSARTAN 24-26 MG PO TABS
1.0000 | ORAL_TABLET | Freq: Two times a day (BID) | ORAL | Status: DC
  Administered 2024-02-12 – 2024-02-15 (×7): 1 via ORAL
  Filled 2024-02-12 (×7): qty 1

## 2024-02-12 MED ORDER — INSULIN ASPART 100 UNIT/ML IJ SOLN
0.0000 [IU] | Freq: Three times a day (TID) | INTRAMUSCULAR | Status: DC
  Administered 2024-02-12: 2 [IU] via SUBCUTANEOUS
  Administered 2024-02-13: 9 [IU] via SUBCUTANEOUS

## 2024-02-12 MED ORDER — SODIUM CHLORIDE 0.9 % IV SOLN
200.0000 mg | Freq: Once | INTRAVENOUS | Status: AC
  Administered 2024-02-12: 200 mg via INTRAVENOUS
  Filled 2024-02-12: qty 10

## 2024-02-12 MED ORDER — DAPAGLIFLOZIN PROPANEDIOL 10 MG PO TABS: 10.0000 mg | ORAL_TABLET | Freq: Every day | ORAL | Status: DC

## 2024-02-12 MED ORDER — CLOPIDOGREL BISULFATE 75 MG PO TABS
75.0000 mg | ORAL_TABLET | Freq: Every day | ORAL | Status: DC
  Administered 2024-02-14 – 2024-02-15 (×2): 75 mg via ORAL
  Filled 2024-02-12 (×3): qty 1

## 2024-02-12 MED ORDER — PREDNISONE 5 MG PO TABS
85.0000 mg | ORAL_TABLET | Freq: Every day | ORAL | Status: DC
  Administered 2024-02-12 – 2024-02-15 (×4): 85 mg via ORAL
  Filled 2024-02-12 (×4): qty 1

## 2024-02-12 MED ORDER — PREDNISONE 20 MG PO TABS: 80.0000 mg | ORAL_TABLET | Freq: Every day | ORAL | Status: DC

## 2024-02-12 NOTE — Progress Notes (Signed)
 Mobility Specialist Progress Note:   02/12/24 1010  Mobility  Activity Ambulated with assistance in hallway;Ambulated with assistance in room  Level of Assistance Standby assist, set-up cues, supervision of patient - no hands on  Assistive Device Front wheel walker  Distance Ambulated (ft) 200 ft  Activity Response Tolerated well  Mobility Referral Yes  Mobility Specialist Start Time (ACUTE ONLY) 1010  Mobility Specialist Stop Time (ACUTE ONLY) 1028  Mobility Specialist Time Calculation (min) (ACUTE ONLY) 18 min   Pt received in bed, agreeable to mobility session. Ambulated in hallway w/o fault. Tolerated well, limited by fatigue. Returned to room, sitting up in chair with all needs met, call bell in reach. All needs met. RN notified.   Yurika Pereda Mobility Specialist Please contact via Special educational needs teacher or  Rehab office at [phone]

## 2024-02-12 NOTE — Inpatient Diabetes Management (Signed)
 Inpatient Diabetes Program Recommendations  AACE/ADA: New Consensus Statement on Inpatient Glycemic Control (2015)  Target Ranges:  Prepandial:   less than 140 mg/dL      Peak postprandial:   less than 180 mg/dL (1-2 hours)      Critically ill patients:  140 - 180 mg/dL   Lab Results  Component Value Date   GLUCAP 267 (H) 02/12/2024   HGBA1C 10.0 (H) 02/10/2024    Review of Glycemic Control  Latest Reference Range & Units 02/11/24 08:15 02/11/24 12:07 02/11/24 17:10 02/11/24 20:47 02/11/24 21:41 02/12/24 06:07  Glucose-Capillary 70 - 99 mg/dL 787 (H) 757 (H) 876 (H) 61 (L) 120 (H) 267 (H)   Diabetes history: DM1 (Requires basal, meal coverage and correction insulin ) Outpatient Diabetes medications: Medtronic Insulin  Pump From office note with Benton Rio 11/27/23: 7a-12a basal rate to 1.55 units per hour instead of 2 units per hour given she has had some recent drops. She can continue other settings: target 150; insulin  to carb ratio: 1:15, insulin  sensitivity of 50, active insulin  time of 4 hrs.  Current orders for Inpatient glycemic control:  Novolog  0-15 tid correction Semglee  14 units bid Novolog  10 units tid with meals    Inpatient Diabetes Program Recommendations:   Note low last PM.  Recommend reducing Novolog  correction to sensitive tid with meals (0-9 units).   Thanks,  Randall Bullocks, RN, BC-ADM Inpatient Diabetes Coordinator Pager [phone]  (8a-5p)

## 2024-02-12 NOTE — Consult Note (Addendum)
 Hospital Consult    Reason for Consult:  temporal arteritis Requesting Physician:  Rojelio HAS MRN #:  [id number]  History of Present Illness: Kiara [name] is a 73 y.o. female with a PMH of CKDIII, MI, carotid artery disease, CAD s/p CABG in 2014, DM II, and combined systolic and diastolic CHF who was admitted to [facility] on 7/7 with an NSTEMI.  She underwent cardiac catheterization on 7/11.  She then underwent cardiac MRI on 7/14 with findings suggestive of ischemic cardiomyopathy.  We were consulted for possible temporal artery biopsy.  The patient endorses a new onset right-sided headache for the past couple of days.  This got worse last night.  She says the pain is in her right temple and goes down into her right earlobe.  She also endorses jaw claudication symptoms.  She denies any vision changes.  She is blind in her left eye.   Past Medical History:  Diagnosis Date   Anemia    Anxiety    Arthritis    Carotid artery disease (HCC)    a. Last carotid study in 2014 showed 1-39% RICA, 40-59% LICA.   Chronic combined systolic (congestive) and diastolic (congestive) heart failure (HCC)    a. EF 40-45% in 2014. b. EF 30-35% in 07/2017.   CKD (chronic kidney disease), stage III (HCC)    Coronary artery disease    a. s/p CABG 2014. b. Cath 07/2017 for NSTEMI -> med rx.   Diabetes mellitus (HCC)    Diabetic peripheral neuropathy (HCC)    Eczema    GERD (gastroesophageal reflux disease)    H/O hiatal hernia    Hyperlipidemia    Hypertension    Myocardial infarction (HCC)    PONV (postoperative nausea and vomiting)    Sleep apnea    Umbilical hernia     Past Surgical History:  Procedure Laterality Date   ABDOMINAL AORTOGRAM W/LOWER EXTREMITY N/A 03/06/2018   Procedure: ABDOMINAL AORTOGRAM W/LOWER EXTREMITY;  Surgeon: Darron Deatrice LABOR, MD;  Location: [facility];  Service: Cardiovascular;  Laterality: N/A;   ABDOMINAL AORTOGRAM W/LOWER EXTREMITY N/A 05/16/2023   Procedure:  ABDOMINAL AORTOGRAM W/LOWER EXTREMITY;  Surgeon: Darron Deatrice LABOR, MD;  Location: [facility];  Service: Cardiovascular;  Laterality: N/A;   ABDOMINAL HYSTERECTOMY     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     CARDIAC CATHETERIZATION     CARPAL TUNNEL RELEASE     CESAREAN SECTION  1983   CORONARY ARTERY BYPASS GRAFT N/A 04/11/2013   Procedure: CORONARY ARTERY BYPASS GRAFTING (CABG);  Surgeon: Dorise MARLA Fellers, MD;  Location: [facility] OR;  Service: Open Heart Surgery;  Laterality: N/A;   EYE SURGERY Bilateral    laser   FRACTURE SURGERY Left 2005   PERIPHERAL INTRAVASCULAR LITHOTRIPSY  05/16/2023   Procedure: PERIPHERAL INTRAVASCULAR LITHOTRIPSY;  Surgeon: Darron Deatrice LABOR, MD;  Location: [facility];  Service: Cardiovascular;;   PERIPHERAL VASCULAR INTERVENTION  05/16/2023   Procedure: PERIPHERAL VASCULAR INTERVENTION;  Surgeon: Darron Deatrice LABOR, MD;  Location: [facility];  Service: Cardiovascular;;   RIGHT HEART CATH AND CORONARY/GRAFT ANGIOGRAPHY N/A 02/08/2024   Procedure: RIGHT HEART CATH AND CORONARY/GRAFT ANGIOGRAPHY;  Surgeon: Elmira Newman PARAS, MD;  Location: [facility];  Service: Cardiovascular;  Laterality: N/A;   RIGHT/LEFT HEART CATH AND CORONARY/GRAFT ANGIOGRAPHY N/A 08/06/2017   Procedure: RIGHT/LEFT HEART CATH AND CORONARY/GRAFT ANGIOGRAPHY;  Surgeon: Mady Bruckner, MD;  Location: [facility];  Service: Cardiovascular;  Laterality: N/A;    Allergies  Allergen Reactions   Bactrim [Sulfamethoxazole-Trimethoprim] Rash   Codeine Nausea And Vomiting   Hydromorphone  Nausea Only and Other (See Comments)    Diaphoretic, blood pressure issues, coughing,   Silvadene [Silver Sulfadiazine] Other (See Comments)    Red hot rash and blisters   Adhesive [Tape] Rash    Band aid brand     Mobic [Meloxicam] Swelling   Morphine  And Codeine Nausea Only and Other (See Comments)    Confusion     Prior to Admission medications   Medication Sig Start Date  End Date Taking? Authorizing Provider  acetaminophen  (TYLENOL ) 650 MG CR tablet Take 1,300 mg by mouth every 8 (eight) hours as needed for pain.   Yes [provider]  albuterol  (VENTOLIN  HFA) 108 (90 Base) MCG/ACT inhaler Inhale 2 puffs into the lungs every 4 (four) hours as needed for shortness of breath or wheezing. 04/16/13  Yes [provider]  aspirin  EC 81 MG tablet Take 81 mg by mouth daily.    Yes [provider]  Cholecalciferol (VITAMIN D ) 2000 units tablet Take 2,000 Units by mouth daily.   Yes [provider]  cilostazol  (PLETAL ) 100 MG tablet Take 1 tablet (100 mg total) by mouth 2 (two) times daily. 12/10/23  Yes Mallipeddi, Vishnu P, MD  Cinnamon 500 MG capsule Take 500 mg by mouth daily.   Yes [provider]  Coenzyme Q10 (COQ10) 100 MG CAPS Take 100 mg by mouth daily.    Yes [provider]  DULoxetine  (CYMBALTA ) 60 MG capsule Take 60 mg by mouth daily. 04/09/23  Yes [provider]  ezetimibe  (ZETIA ) 10 MG tablet Take 1 tablet (10 mg total) by mouth daily. 12/10/23  Yes Mallipeddi, Vishnu P, MD  fluticasone  (FLONASE ) 50 MCG/ACT nasal spray Place 2 sprays into both nostrils at bedtime as needed for allergies or rhinitis.   Yes [provider]  furosemide  (LASIX ) 40 MG tablet Take 1 tablet (40 mg total) by mouth 2 (two) times daily. Patient taking differently: Take 40 mg by mouth daily. 09/17/23  Yes Meng, Hao, PA  gabapentin  (NEURONTIN ) 800 MG tablet Take 1,600 mg by mouth 3 (three) times daily. 07/29/13  Yes [provider]  hydrocortisone  cream 1 % Apply 1 Application topically daily as needed for itching.   Yes [provider]  insulin  aspart (NOVOLOG ) 100 UNIT/ML injection Inject 1.75 Units into the skin See admin instructions. 1.25 Units 12 Am-7 Am,  1.75 u/hr 7 AM  Til 12 AM, Bolus 1 unit for every 15 carbs, 1 Unit for every 50 points greater than 150 Insulin  pump 11/12/13  Yes [provider]  levocetirizine (XYZAL ) 5 MG tablet Take 5 mg by mouth daily.  07/21/13  Yes [provider]  metoprolol  succinate (TOPROL -XL) 25 MG 24 hr tablet TAKE 1 TABLET TWICE DAILY 11/06/23  Yes Mallipeddi, Vishnu P, MD  montelukast (SINGULAIR) 10 MG tablet Take 10 mg by mouth daily. 10/30/22  Yes [provider]  ofloxacin  (OCUFLOX ) 0.3 % ophthalmic solution Place 2 drops into the left eye 3 (three) times daily. 01/24/24  Yes [provider]  omeprazole (PRILOSEC) 40 MG capsule Take 40 mg by mouth daily. 01/23/19  Yes [provider]  rosuvastatin  (CRESTOR ) 40 MG tablet TAKE ONE TABLET BY MOUTH ONE TIME A DAY. STOP LIPITOR  03/12/23  Yes Hochrein, Lynwood, MD  Turmeric 500 MG TABS Take 1 tablet by mouth daily.   Yes [provider]    Social History   Socioeconomic History   Marital status: Widowed    Spouse name: Not on file   Number of children: Not on file   Years of education: Not on file   Highest education level: Not on file  Occupational History   Not on file  Tobacco Use   Smoking status: Former    Current packs/day: 0.00    Types: Cigarettes    Start date: 05/30/1985    Quit date: 05/30/2010    Years since quitting: 13.7    Passive exposure: Never   Smokeless tobacco: Never   Tobacco comments:    STOPPED 2007  Vaping Use   Vaping status: Never Used  Substance and Sexual Activity   Alcohol use: No   Drug use: No   Sexual activity: Not on file  Other Topics Concern   Not on file  Social History Narrative   Not on file   Social Drivers of Health   Financial Resource Strain: Not on file  Food Insecurity: No Food Insecurity (02/06/2024)   Hunger Vital Sign    Worried About Running Out of Food in the Last Year: Never true    Ran Out of Food in the Last Year: Never true  Transportation Needs: No Transportation Needs (02/06/2024)   PRAPARE - Administrator, Civil Service (Medical): No    Lack of Transportation  (Non-Medical): No  Physical Activity: Not on file  Stress: Not on file  Social Connections: Moderately Integrated (02/06/2024)   Social Connection and Isolation Panel    Frequency of Communication with Friends and Family: Three times a week    Frequency of Social Gatherings with Friends and Family: Three times a week    Attends Religious Services: More than 4 times per year    Active Member of Clubs or Organizations: Yes    Attends Banker Meetings: More than 4 times per year    Marital Status: Widowed  Intimate Partner Violence: Not At Risk (02/06/2024)   Humiliation, Afraid, Rape, and Kick questionnaire    Fear of Current or Ex-Partner: No    Emotionally Abused: No    Physically Abused: No    Sexually Abused: No     Family History  Problem Relation Age of Onset   Heart attack Mother    Heart disease Mother    Hypertension Mother    Heart attack Brother    Heart disease Brother    Hypertension Brother     ROS: Otherwise negative unless mentioned in HPI  Physical Examination  Vitals:   02/12/24 0733 02/12/24 1132  BP: (!) 152/55 (!) 119/59  Pulse: 92 98  Resp: 20 18  Temp: 98.5 F (36.9 C) 98.5 F (36.9 C)  SpO2: 98% 98%   Body mass index is 32.84 kg/m.  General:  WDWN in NAD Gait: Not observed HENT: WNL, normocephalic Pulmonary: normal non-labored breathing Cardiac: regular Abdomen:  soft, NT/ND, no masses Skin: without rashes Vascular Exam/Pulses: palpable radial pulses Extremities: without ischemic changes, without Gangrene , without cellulitis; without open wounds;  Musculoskeletal: no muscle wasting or atrophy  Neurologic: A&O X 3;  No focal weakness or paresthesias are detected; speech is fluent/normal Psychiatric:  The pt has Normal affect. Lymph:  Unremarkable  CBC    Component Value Date/Time   WBC 16.1 (H) 02/12/2024 0405   RBC 3.44 (L) 02/12/2024 0405   HGB 10.2 (L) 02/12/2024 0405   HGB 10.3 (L) 02/19/2018 1152  HCT 32.1 (L)  02/12/2024 0405   HCT 33.0 (L) 02/19/2018 1152   PLT 376 02/12/2024 0405   PLT 229 02/19/2018 1152   MCV 93.3 02/12/2024 0405   MCV 97 02/19/2018 1152   MCH 29.7 02/12/2024 0405   MCHC 31.8 02/12/2024 0405   RDW 14.8 02/12/2024 0405   RDW 13.5 02/19/2018 1152   LYMPHSABS 2.0 07/28/2019 1448   MONOABS 0.6 07/28/2019 1448   EOSABS 0.2 07/28/2019 1448   BASOSABS 0.1 07/28/2019 1448    BMET    Component Value Date/Time   NA 136 02/12/2024 0405   NA 140 02/19/2018 1152   K 4.3 02/12/2024 0405   CL 98 02/12/2024 0405   CO2 26 02/12/2024 0405   GLUCOSE 240 (H) 02/12/2024 0405   BUN 25 (H) 02/12/2024 0405   BUN 30 (A) 07/31/2023 0000   CREATININE 1.42 (H) 02/12/2024 0405   CALCIUM  8.9 02/12/2024 0405   GFRNONAA 40 (L) 02/12/2024 0405   GFRAA 38 (L) 07/28/2019 1448    COAGS: Lab Results  Component Value Date   INR 1.21 08/03/2017   INR 1.41 04/11/2013   INR 1.02 04/09/2013      ASSESSMENT/PLAN: This is a 73 y.o. female admitted for NSTEMI   - The patient was admitted to [facility] on 7/7 with shortness of breath and chest pain.  She was found to have elevated troponin and BNP.  She has been treated for an NSTEMI - Over the past couple of days the patient has been experiencing a worsening right-sided headache.  She endorses stabbing pain in the right temporal area that spreads to her earlobe and jaw.  This significantly worsened last night -She also endorses jaw claudication.  She denies any acute vision changes - We were asked to perform right sided temporal artery biopsy in the setting of possible temporal arteritis.  I have discussed the procedure with the patient and she is agreeable to proceed.  We can plan for right temporal artery biopsy in the OR tomorrow with Dr. Magda.  Will make n.p.o. at midnight - Dr. Sheree will evaluate the patient later this afternooon   Ahmed Holster PA-C [facility] [phone]   I have independently interviewed and  examined patient and agree with PA assessment and plan above.  Agree with proceeding with right temporal artery biopsy.  Patient is n.p.o. past midnight.  We discussed the risk benefits and alternatives and she demonstrates good understanding.  Riyah Bardon C. Sheree, MD [facility] [place] Office: [phone] Pager: [phone]

## 2024-02-12 NOTE — Telephone Encounter (Signed)
 Patient Product/process development scientist completed.    The patient is insured through [employer]. Patient has Medicare and is not eligible for a copay card, but may be able to apply for patient assistance or Medicare RX Payment Plan (Patient Must reach out to their plan, if eligible for payment plan), if available.    Ran test claim for Entresto  24-26 mg and the current 30 day co-pay is $177.64.   This test claim was processed through [facility]- copay amounts may vary at other pharmacies due to pharmacy/plan contracts, or as the patient moves through the different stages of their insurance plan.     Reyes Sharps, CPHT Pharmacy Technician III Certified Patient Advocate [facility] Patient Advocate Team Direct Number: [phone]  Fax: [phone]

## 2024-02-12 NOTE — Plan of Care (Signed)
   Problem: Education: Goal: Ability to describe self-care measures that may prevent or decrease complications (Diabetes Survival Skills Education) will improve Outcome: Progressing   Problem: Coping: Goal: Ability to adjust to condition or change in health will improve Outcome: Progressing   Problem: Fluid Volume: Goal: Ability to maintain a balanced intake and output will improve Outcome: Progressing

## 2024-02-12 NOTE — Progress Notes (Addendum)
 Advanced Heart Failure Rounding Note   Subjective:    Milrinone  stopped 07/14. CO-OX 69% this am.   CVP 3. Scr further improved to 1.4.    BP elevated  Feeling well. Wants to go home. Ambulated halls twice yesterday.     cMRI 02/11/24: LVEF 34%, RVEF 48%, LGE LAD territory with some viability, nonviable myocardium at mid-apical inferoseptum. Findings suggest ischemic cardiomyopathy.   Objective:   Weight Range:  Vital Signs:   Temp:  [98.1 F (36.7 C)-99 F (37.2 C)] 98.5 F (36.9 C) (07/15 0733) Pulse Rate:  [88-95] 92 (07/15 0733) Resp:  [20] 20 (07/15 0733) BP: (102-152)/(35-89) 152/55 (07/15 0733) SpO2:  [98 %-100 %] 98 % (07/15 0733) Weight:  [84.1 kg] 84.1 kg (07/15 0223) Last BM Date : 02/10/24  Weight change: Filed Weights   02/09/24 0920 02/10/24 0617 02/12/24 0223  Weight: 86.5 kg 85.6 kg 84.1 kg    Intake/Output:   Intake/Output Summary (Last 24 hours) at 02/12/2024 0807 Last data filed at 02/12/2024 0648 Gross per 24 hour  Intake --  Output 1800 ml  Net -1800 ml    Physical Exam: General:  Well appearing.  Neck: no JVD.  Cor: Regular rate & rhythm. No rubs, gallops or murmurs. Lungs: clear Abdomen: soft, nontender, nondistended.  Extremities: no edema Neuro: alert & oriented x 3. Affect pleasant   Telemetry: SR 80s-90s  Labs: Basic Metabolic Panel: Recent Labs  Lab 02/08/24 0355 02/08/24 0826 02/08/24 0830 02/09/24 0607 02/10/24 0400 02/11/24 0515 02/12/24 0405  NA 132*   < > 136 135 135 134* 136  K 4.2   < > 3.7 3.3* 3.3* 4.2 4.3  CL 93*  --   --  93* 94* 96* 98  CO2 28  --   --  28 31 28 26   GLUCOSE 124*  --   --  310* 192* 185* 240*  BUN 57*  --   --  44* 35* 28* 25*  CREATININE 2.27*  --   --  1.77* 1.77* 1.58* 1.42*  CALCIUM  8.5*  --   --  8.3* 8.4* 8.6* 8.9  MG  --   --   --  2.5* 2.1 2.2 2.4   < > = values in this interval not displayed.    Liver Function Tests: No results for input(s): AST, ALT,  ALKPHOS, BILITOT, PROT, ALBUMIN  in the last 168 hours. No results for input(s): LIPASE, AMYLASE in the last 168 hours.  No results for input(s): AMMONIA in the last 168 hours.  CBC: Recent Labs  Lab 02/08/24 0355 02/08/24 0826 02/08/24 0830 02/09/24 0607 02/10/24 0400 02/11/24 0515 02/12/24 0405  WBC 12.7*  --   --  10.8* 14.2* 16.2* 16.1*  HGB 8.7*   < > 8.8* 9.2* 9.6* 10.0* 10.2*  HCT 27.2*   < > 26.0* 27.7* 29.6* 30.9* 32.1*  MCV 92.2  --   --  92.0 91.6 92.2 93.3  PLT 268  --   --  287 341 349 376   < > = values in this interval not displayed.    Cardiac Enzymes: No results for input(s): CKTOTAL, CKMB, CKMBINDEX, TROPONINI in the last 168 hours.  BNP: BNP (last 3 results) Recent Labs    02/04/24 1959  BNP 1,264.0*    ProBNP (last 3 results) No results for input(s): PROBNP in the last 8760 hours.    Other results:  Imaging: MR CARDIAC MORPHOLOGY W WO CONTRAST Result Date: 02/11/2024 CLINICAL DATA:  Cardiomyopathy,  undefined, further testing COMPARISON: Echo 02/05/24 EXAM: MR CARDIA MORPHOLOGY WITHOUT AND WITH CONTRAST; MR CARDIAC VELOCITY FLOW MAPPING TECHNIQUE: The patient was scanned on a 1.5 Tesla Siemens magnet. A dedicated cardiac coil was used. Functional imaging was done using TrueFisp sequences. 2,3, and 4 chamber views were done to assess for RWMA's. Modified Simpson's rule using a short axis stack was used to calculate an ejection fraction on a dedicated work [employer]. The patient received 10mL GADAVIST  GADOBUTROL  1 MMOL/ML IV SOLN. After 10 minutes inversion recovery sequences were used to assess for infiltration and scar tissue. Phase contrast velocity encoded images obtained x 2. This examination is tailored for evaluation cardiac anatomy and function and provides very limited assessment of noncardiac structures, which are accordingly not evaluated during interpretation. If there is clinical concern for extracardiac  pathology, further evaluation with CT imaging should be considered. FINDINGS: LEFT VENTRICLE: Left ventricular chamber size: Normal by indexed volume. Left ventricular wall thickness: Mildly increased. Maximal wall thickness 13 mm. Left ventricular systolic function: Moderately reduced LVEF = 34% There are regional wall motion abnormalities: Mid to apical anterior akinesis. No myocardial edema, T2 = 52 msec Abnormal first pass perfusion, anterior and lateral wall infarct. There is post contrast delayed myocardial enhancement: Mid to apical anterior wall subendocardial LGE, <50% myocardial thickness suggesting potential viability. Subendocardial basal-apical lateral wall LGE, <50% myocardial thickness suggesting potential viability. Subendocardial mid ventricular inferoseptal LGE that extends to the apex and is >50% myocardial thickness suggesting no viability, but is focal at the inferoseptum/inferior RV insertion point. Subendocardial apical inferior wall, <50% myocardial thickness, suggesting potential viability. Normal T1 myocardial nulling kinetics suggest against a diagnosis of cardiac amyloidosis. ECV = 30%, nonspecific elevation. RIGHT VENTRICLE: Normal right ventricular chamber size. Normal right ventricular wall thickness. Mildly reduced right ventricular systolic function. RVEF = 48% Mild global hypokinesis. No post contrast delayed myocardial enhancement. ATRIA: Grossly normal. PERICARDIUM: Normal pericardium.  Trivial pericardial effusion. OTHER: Small bilateral pleural effusions. MEASUREMENTS: Qp/Qs: 0.7 VALVES: Tricuspid aortic valve. Aortic valve regurgitation: Trivial, regurgitant fraction 4% Pulmonary valve regurgitation: Moderate, regurgitant fraction 20% Mitral valve regurgitation: Mild, regurgitant fraction 10% Tricuspid valve regurgitation: Mild, regurgitant fraction 17% Left ventricle: LV female LV EF: 34 % (Normal 52-79%) Absolute volumes: LV EDV: (Normal 78-167 mL) LV ESV: (Normal  21-64 mL) LV SV: 58mL (Normal 52-114 mL) CO: 4.9L/min (Normal 2.7-6.3 L/min) Indexed volumes: CI: 2.51L/min/sq-m (Normal 1.9-3.9 L/min/sq-m) LV EDV: 39mL/sq-m (Normal 50-96 mL/sq-m) LV ESV: 11mL/sq-m (Normal 10-40 mL/sq-m) LV SV: 56mL/sq-m (Normal 33-64 mL/sq-m) Right ventricle: RV female RV EF: 48% (normal 52-80%) Absolute volumes: RV EDV: (Normal 79-175 mL) RV ESV: 55mL (Normal 13-75 mL) RV SV: 51mL (Normal 56-110 mL) CO: 4.3L/min (Normal 2.7-6 L/min) Indexed volumes: CI: 2.2L/min/sq-m (Normal 1.8-3.8 L/min/sq-m) RV EDV: 42mL/sq-m (Normal 51-97 mL/sq-m) RV ESV: 84mL/sq-m (Normal 9-42 mL/sq-m) RV SV: 77mL/sq-m (Normal 35-61 mL/sq-m) IMPRESSION: 1. Moderately reduced LV systolic function with grossly normal LV chamber size. LVEF 34%. There is subendocardial scar in the basal to apical lateral, mid to apical anterior, and apical inferior walls with potential viability, with focal transmural likely nonviable myocardium at the mid-apical inferoseptum. Findings in combination suggest ischemic cardiomyopathy. 2. Mildly reduced RV systolic function with normal RV chamber size, RVEF 48%. No delayed myocardial enhancement. 3. Moderate pulmonary valve regurgitation, mild mitral and tricuspid valve regurgitation. Electronically Signed   By: Soyla Merck M.D.   On: 02/11/2024 10:07   MR CARDIAC VELOCITY FLOW MAP Result Date: 02/11/2024 CLINICAL  DATA:  Cardiomyopathy, undefined, further testing COMPARISON: Echo 02/05/24 EXAM: MR CARDIA MORPHOLOGY WITHOUT AND WITH CONTRAST; MR CARDIAC VELOCITY FLOW MAPPING TECHNIQUE: The patient was scanned on a 1.5 Tesla Siemens magnet. A dedicated cardiac coil was used. Functional imaging was done using TrueFisp sequences. 2,3, and 4 chamber views were done to assess for RWMA's. Modified Simpson's rule using a short axis stack was used to calculate an ejection fraction on a dedicated work [employer]. The patient received 10mL GADAVIST  GADOBUTROL  1 MMOL/ML IV SOLN.  After 10 minutes inversion recovery sequences were used to assess for infiltration and scar tissue. Phase contrast velocity encoded images obtained x 2. This examination is tailored for evaluation cardiac anatomy and function and provides very limited assessment of noncardiac structures, which are accordingly not evaluated during interpretation. If there is clinical concern for extracardiac pathology, further evaluation with CT imaging should be considered. FINDINGS: LEFT VENTRICLE: Left ventricular chamber size: Normal by indexed volume. Left ventricular wall thickness: Mildly increased. Maximal wall thickness 13 mm. Left ventricular systolic function: Moderately reduced LVEF = 34% There are regional wall motion abnormalities: Mid to apical anterior akinesis. No myocardial edema, T2 = 52 msec Abnormal first pass perfusion, anterior and lateral wall infarct. There is post contrast delayed myocardial enhancement: Mid to apical anterior wall subendocardial LGE, <50% myocardial thickness suggesting potential viability. Subendocardial basal-apical lateral wall LGE, <50% myocardial thickness suggesting potential viability. Subendocardial mid ventricular inferoseptal LGE that extends to the apex and is >50% myocardial thickness suggesting no viability, but is focal at the inferoseptum/inferior RV insertion point. Subendocardial apical inferior wall, <50% myocardial thickness, suggesting potential viability. Normal T1 myocardial nulling kinetics suggest against a diagnosis of cardiac amyloidosis. ECV = 30%, nonspecific elevation. RIGHT VENTRICLE: Normal right ventricular chamber size. Normal right ventricular wall thickness. Mildly reduced right ventricular systolic function. RVEF = 48% Mild global hypokinesis. No post contrast delayed myocardial enhancement. ATRIA: Grossly normal. PERICARDIUM: Normal pericardium.  Trivial pericardial effusion. OTHER: Small bilateral pleural effusions. MEASUREMENTS: Qp/Qs: 0.7 VALVES:  Tricuspid aortic valve. Aortic valve regurgitation: Trivial, regurgitant fraction 4% Pulmonary valve regurgitation: Moderate, regurgitant fraction 20% Mitral valve regurgitation: Mild, regurgitant fraction 10% Tricuspid valve regurgitation: Mild, regurgitant fraction 17% Left ventricle: LV female LV EF: 34 % (Normal 52-79%) Absolute volumes: LV EDV: (Normal 78-167 mL) LV ESV: (Normal 21-64 mL) LV SV: 58mL (Normal 52-114 mL) CO: 4.9L/min (Normal 2.7-6.3 L/min) Indexed volumes: CI: 2.51L/min/sq-m (Normal 1.9-3.9 L/min/sq-m) LV EDV: 38mL/sq-m (Normal 50-96 mL/sq-m) LV ESV: 47mL/sq-m (Normal 10-40 mL/sq-m) LV SV: 57mL/sq-m (Normal 33-64 mL/sq-m) Right ventricle: RV female RV EF: 48% (normal 52-80%) Absolute volumes: RV EDV: (Normal 79-175 mL) RV ESV: 55mL (Normal 13-75 mL) RV SV: 51mL (Normal 56-110 mL) CO: 4.3L/min (Normal 2.7-6 L/min) Indexed volumes: CI: 2.2L/min/sq-m (Normal 1.8-3.8 L/min/sq-m) RV EDV: 59mL/sq-m (Normal 51-97 mL/sq-m) RV ESV: 2mL/sq-m (Normal 9-42 mL/sq-m) RV SV: 40mL/sq-m (Normal 35-61 mL/sq-m) IMPRESSION: 1. Moderately reduced LV systolic function with grossly normal LV chamber size. LVEF 34%. There is subendocardial scar in the basal to apical lateral, mid to apical anterior, and apical inferior walls with potential viability, with focal transmural likely nonviable myocardium at the mid-apical inferoseptum. Findings in combination suggest ischemic cardiomyopathy. 2. Mildly reduced RV systolic function with normal RV chamber size, RVEF 48%. No delayed myocardial enhancement. 3. Moderate pulmonary valve regurgitation, mild mitral and tricuspid valve regurgitation. Electronically Signed   By: Soyla Merck M.D.   On: 02/11/2024 10:07   MR CARDIAC VELOCITY FLOW MAP Result  Date: 02/11/2024 CLINICAL DATA:  Cardiomyopathy, undefined, further testing COMPARISON: Echo 02/05/24 EXAM: MR CARDIA MORPHOLOGY WITHOUT AND WITH CONTRAST; MR CARDIAC VELOCITY FLOW MAPPING TECHNIQUE: The  patient was scanned on a 1.5 Tesla Siemens magnet. A dedicated cardiac coil was used. Functional imaging was done using TrueFisp sequences. 2,3, and 4 chamber views were done to assess for RWMA's. Modified Simpson's rule using a short axis stack was used to calculate an ejection fraction on a dedicated work [employer], [employer]. The patient received 10mL GADAVIST  GADOBUTROL  1 MMOL/ML IV SOLN. After 10 minutes inversion recovery sequences were used to assess for infiltration and scar tissue. Phase contrast velocity encoded images obtained x 2. This examination is tailored for evaluation cardiac anatomy and function and provides very limited assessment of noncardiac structures, which are accordingly not evaluated during interpretation. If there is clinical concern for extracardiac pathology, further evaluation with CT imaging should be considered. FINDINGS: LEFT VENTRICLE: Left ventricular chamber size: Normal by indexed volume. Left ventricular wall thickness: Mildly increased. Maximal wall thickness 13 mm. Left ventricular systolic function: Moderately reduced LVEF = 34% There are regional wall motion abnormalities: Mid to apical anterior akinesis. No myocardial edema, T2 = 52 msec Abnormal first pass perfusion, anterior and lateral wall infarct. There is post contrast delayed myocardial enhancement: Mid to apical anterior wall subendocardial LGE, <50% myocardial thickness suggesting potential viability. Subendocardial basal-apical lateral wall LGE, <50% myocardial thickness suggesting potential viability. Subendocardial mid ventricular inferoseptal LGE that extends to the apex and is >50% myocardial thickness suggesting no viability, but is focal at the inferoseptum/inferior RV insertion point. Subendocardial apical inferior wall, <50% myocardial thickness, suggesting potential viability. Normal T1 myocardial nulling kinetics suggest against a diagnosis of cardiac amyloidosis. ECV = 30%, nonspecific  elevation. RIGHT VENTRICLE: Normal right ventricular chamber size. Normal right ventricular wall thickness. Mildly reduced right ventricular systolic function. RVEF = 48% Mild global hypokinesis. No post contrast delayed myocardial enhancement. ATRIA: Grossly normal. PERICARDIUM: Normal pericardium.  Trivial pericardial effusion. OTHER: Small bilateral pleural effusions. MEASUREMENTS: Qp/Qs: 0.7 VALVES: Tricuspid aortic valve. Aortic valve regurgitation: Trivial, regurgitant fraction 4% Pulmonary valve regurgitation: Moderate, regurgitant fraction 20% Mitral valve regurgitation: Mild, regurgitant fraction 10% Tricuspid valve regurgitation: Mild, regurgitant fraction 17% Left ventricle: LV female LV EF: 34 % (Normal 52-79%) Absolute volumes: LV EDV: (Normal 78-167 mL) LV ESV: (Normal 21-64 mL) LV SV: 58mL (Normal 52-114 mL) CO: 4.9L/min (Normal 2.7-6.3 L/min) Indexed volumes: CI: 2.51L/min/sq-m (Normal 1.9-3.9 L/min/sq-m) LV EDV: 54mL/sq-m (Normal 50-96 mL/sq-m) LV ESV: 39mL/sq-m (Normal 10-40 mL/sq-m) LV SV: 62mL/sq-m (Normal 33-64 mL/sq-m) Right ventricle: RV female RV EF: 48% (normal 52-80%) Absolute volumes: RV EDV: (Normal 79-175 mL) RV ESV: 55mL (Normal 13-75 mL) RV SV: 51mL (Normal 56-110 mL) CO: 4.3L/min (Normal 2.7-6 L/min) Indexed volumes: CI: 2.2L/min/sq-m (Normal 1.8-3.8 L/min/sq-m) RV EDV: 17mL/sq-m (Normal 51-97 mL/sq-m) RV ESV: 36mL/sq-m (Normal 9-42 mL/sq-m) RV SV: 80mL/sq-m (Normal 35-61 mL/sq-m) IMPRESSION: 1. Moderately reduced LV systolic function with grossly normal LV chamber size. LVEF 34%. There is subendocardial scar in the basal to apical lateral, mid to apical anterior, and apical inferior walls with potential viability, with focal transmural likely nonviable myocardium at the mid-apical inferoseptum. Findings in combination suggest ischemic cardiomyopathy. 2. Mildly reduced RV systolic function with normal RV chamber size, RVEF 48%. No delayed myocardial enhancement. 3.  Moderate pulmonary valve regurgitation, mild mitral and tricuspid valve regurgitation. Electronically Signed   By: Soyla Merck M.D.   On: 02/11/2024 10:07  Medications:     Scheduled Medications:  aspirin  EC  81 mg Oral Daily   atorvastatin   80 mg Oral Daily   Chlorhexidine  Gluconate Cloth  6 each Topical Daily   clopidogrel   75 mg Oral Daily   DULoxetine   60 mg Oral Daily   ezetimibe   10 mg Oral Daily   fluticasone   2 spray Each Nare QHS   gabapentin   300 mg Oral BID   heparin  injection (subcutaneous)  5,000 Units Subcutaneous Q8H   insulin  aspart  0-15 Units Subcutaneous TID WC   insulin  aspart  10 Units Subcutaneous TID WC   insulin  glargine-yfgn  14 Units Subcutaneous BID   loratadine   10 mg Oral Daily   pantoprazole   40 mg Oral Daily   sodium chloride  flush  10-40 mL Intracatheter Q12H    Infusions:    PRN Medications: acetaminophen , alum & mag hydroxide-simeth, fentaNYL  (SUBLIMAZE ) injection, ipratropium-albuterol , ondansetron  (ZOFRAN ) IV, sodium chloride , sodium chloride  flush   Assessment/Plan:   1. Acute on chronic systolic CHF: Ischemic cardiomyopathy.  Echo this admission with EF 30%, global hypokinesis (prior echo in 2020 with normal EF).  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   - cMRI 07/14: LVEF 34%, RVEF 48%, LGE LAD territory with some viability, nonviable myocardium at mid-apical inferoseptum. Findings suggest ischemic cardiomyopathy. - Stable CO-OX off milrinone  - Volume looks good. CVP 3. Will need to restart diuretics at discharge (took 40 mg lasix  daily at home). - No SGLT2i with type I diabetes  - BP now trending up. Start Entresto  24/26 mg BID.  - Will have Pharmacy team check on cost of HF meds - off beta blocker with low-output  2. AKI on CKD stage 3b: Creatinine up to 2.4. Suspect cardiorenal syndrome, with initial contrast bolus  from CTA chest playing a role. - Scr now improved to 1.4. - SGLT2i prior to d/c - Continue to follow   3. CAD: H/o CABG x 2.  NSTEMI at presentation with HS-TnI to 2900.  Cath showed patent LIMA-LAD and SVG-PDA with unchanged native vessels (occluded RCA, occluded OMs).  No target for intervention.  - Continue ASA 81/Plavix .  - Continue atorvastatin  and Zetia .  - No s/s angina  4. PAD: Followed as outpatient by Dr. Darron.  - No rest pain  - Would not restart pletal  at discharge, contraindicated in CHF  5. 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  - Improved with diuresis. Outpatient f/u  6. Anemia: Hgb stable in 10s - Iron  stores are low.  - Give IV iron  today  7. Hypokalemia - Resolved  8. Leukocytosis: WBCs up to 16K. Afebrile. - PCT 0.39>0.11, BC X 2 NG X 2 days, UA on admit okay - No clear evidence of infection. 2/2 Acute MI?  9. DM I - Uncontrolled, A1c 10% - Followed by endocrine in outpatient setting   Will discuss timing of discharge with Dr. Dejanique Ruehl. May be ready from HF standpoint this afternoon after receiving IV iron .  Will arrange f/u in HF clinic.  Length of Stay: 7   [facility], LINDSAY N MD  02/12/2024, 8:07 AM  Advanced Heart Failure Team Pager [phone] (M-F; 7a - 4p)  Please contact [facility] for night-coverage after hours (4p -7a ) and weekends on [email or web address]   Patient seen and examined with the above-signed Advanced Practice Provider and/or  Housestaff. I personally reviewed laboratory data, imaging studies and relevant notes. I independently examined the patient and formulated the important aspects of the plan. I have edited the note to reflect any of my changes or salient points. I have personally discussed the plan with the patient and/or family.  Co-ox stable off milrinone  for 24 hours. cMRI reviewed with her EF 34% with LGE consisitent  with iCM.   Volume status much improved after 20 pound diuresis   General:  Sitting up on side of bed No resp difficulty HEENT: normal Neck: supple. no JVD. Carotids 2+ bilat; no bruits. No lymphadenopathy or thryomegaly appreciated. Cor: PMI nondisplaced. Regular rate & rhythm. No rubs, gallops or murmurs. Lungs: clear Abdomen: soft, nontender, nondistended. No hepatosplenomegaly. No bruits or masses. Good bowel sounds. Extremities: no cyanosis, clubbing, rash, edema Neuro: alert & orientedx3, cranial nerves grossly intact. moves all 4 extremities w/o difficulty. Affect pleasant   Would watch one more day off milrinone . Add GDMT carefully. If volume status, co-ox and renal function stable acan go home tomorrow.   Will need to consider ICD down the road once GDMT optimized.   Toribio Fuel, MD  12:42 PM

## 2024-02-12 NOTE — Progress Notes (Addendum)
 PROGRESS NOTE    Kiara [name]  FMW:[id number] DOB: 1951/06/17 DOA: 02/04/2024 PCP: Lawrance Handing, MD     Brief Narrative:  Kiara [name] is a 73 y.o. female with medical history significant of HFpEF, CAD s/p CABG in 2014, CKD3, HTN, HLD, and DM2 p/w cp and found to have elevated troponin and BNP c/f ACS.    Pt states that she was in her usual state of health until 0800 when she awoke from her sleep w/ acute onset SOB. She endorses intermittent CP lasting 5-34min over the past few days that worsens with activities like walking/talking and improves with rest. She reports following a strict low sodium diet and denies any recent weight gain or LE edema to accompany her SOB.    In the ED, pt tachycardic and tachypneic on 4L [place]. Labs notable for K 3.2, Cr 1.65, BNP 1264, troponin 2778-->2969, and WBC 15.9. CTA chest neg for PE, but did show b/l pleural effusions. Pt started on hep gtt for ACS, cards consulted per EDP, and pt admitted to medicine for ongoing w/u.  Patient underwent heart cath 7/11. She was diuresed well with IV lasix .   New events last 24 hours / Subjective: UOP documented . Admits to a headache on the right side. States this has been ongoing for several days but got worse overnight. Headache is right-sided, temporal and near right ear, also with jaw claudication symptoms. Denies any vision changes. She is blind in her left eye.   Assessment & Plan:   Principal Problem:   NSTEMI (non-ST elevated myocardial infarction) (HCC) Active Problems:   Essential hypertension   Anxiety   HLD (hyperlipidemia)   Acute respiratory failure with hypoxia (HCC)   Acute on chronic combined systolic and diastolic CHF (congestive heart failure) (HCC)   ACS (acute coronary syndrome) (HCC)   Uncontrolled type 2 diabetes mellitus with hyperglycemia, with long-term current use of insulin  (HCC)   AKI (acute kidney injury) (HCC)   CKD stage 3b, GFR 30-44 ml/min (HCC)   NSTEMI -  Appreciate cardiology, s/p heart cath 7/11 - no targets for PCI  - Aspirin , Plavix , lipitor    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 It is sharp and sudden in nature, on her right temple and near her ear, also with jaw claudication.  - CRP 4.3 - ESR 121 - Start prednisone  85mg  (1mg /kg)  - Consult vascular surgery for temporal biopsy (last dose asa/plavix  7/15 AM)   Acute on chronic systolic and diastolic heart failure Ischemic cardiomyopathy  - EF 30% - Advanced heart failure team consulted, started milrinone  --> now discontinued - Cardiac MRI suggestive of ischemic cardiomyopathy  - Diuretics per heart failure team  - Entresto    Acute hypoxemic respiratory failure - Desatted down to as low as 66% on room air.  Required 2 L nasal cannula O2.  - Resolved. Now on room air   AKI on CKD stage IIIb - Baseline creatinine 1.7 - AKI resolved   Hyperlipidemia - Lipitor    Diabetes mellitus type 2, uncontrolled with hyperglycemia - Hemoglobin A1c 10.6 in April 2025 - Semglee , NovoLog , sliding scale insulin    Leukocytosis - Infectious work up has been negative. ?Due to temporal arteritis. See above    DVT prophylaxis: Subc hep  Code Status: Full code Family Communication: None at bedside Disposition Plan: Home  Status is: Inpatient Remains inpatient appropriate because: Vascular surgery consult    Antimicrobials:  Anti-infectives (From admission, onward)  None        Objective: Vitals:   02/11/24 2253 02/12/24 0223 02/12/24 0733 02/12/24 1132  BP: 118/75 (!) 148/52 (!) 152/55 (!) 119/59  Pulse: 95 90 92 98  Resp: 20 20 20 18   Temp: 98.3 F (36.8 C) 98.1 F (36.7 C) 98.5 F (36.9 C) 98.5 F (36.9 C)  TempSrc: Oral Oral Oral Oral  SpO2: 98% 98% 98% 98%  Weight:  84.1 kg    Height:        Intake/Output Summary (Last 24 hours) at 02/12/2024 1512 Last data filed at 02/12/2024 0648 Gross per 24 hour  Intake  --  Output 1800 ml  Net -1800 ml   Filed Weights   02/09/24 0920 02/10/24 0617 02/12/24 0223  Weight: 86.5 kg 85.6 kg 84.1 kg   Examination: General exam: Appears calm and comfortable  Respiratory system: Clear to auscultation. Respiratory effort normal. On room air  Cardiovascular system: S1 & S2 heard.  Gastrointestinal system: Abdomen is nondistended, soft and nontender. Normal bowel sounds heard. Central nervous system: Alert and oriented. Non focal exam. Speech clear  Extremities: Symmetric in appearance bilaterally  Skin: No rashes, lesions or ulcers on exposed skin  Psychiatry: Judgement and insight appear stable. Mood & affect appropriate.    Data Reviewed: I have personally reviewed following labs and imaging studies  CBC: Recent Labs  Lab 02/08/24 0355 02/08/24 0826 02/08/24 0830 02/09/24 0607 02/10/24 0400 02/11/24 0515 02/12/24 0405  WBC 12.7*  --   --  10.8* 14.2* 16.2* 16.1*  HGB 8.7*   < > 8.8* 9.2* 9.6* 10.0* 10.2*  HCT 27.2*   < > 26.0* 27.7* 29.6* 30.9* 32.1*  MCV 92.2  --   --  92.0 91.6 92.2 93.3  PLT 268  --   --  287 341 349 376   < > = values in this interval not displayed.   Basic Metabolic Panel: Recent Labs  Lab 02/08/24 0355 02/08/24 0826 02/08/24 0830 02/09/24 0607 02/10/24 0400 02/11/24 0515 02/12/24 0405  NA 132*   < > 136 135 135 134* 136  K 4.2   < > 3.7 3.3* 3.3* 4.2 4.3  CL 93*  --   --  93* 94* 96* 98  CO2 28  --   --  28 31 28 26   GLUCOSE 124*  --   --  310* 192* 185* 240*  BUN 57*  --   --  44* 35* 28* 25*  CREATININE 2.27*  --   --  1.77* 1.77* 1.58* 1.42*  CALCIUM  8.5*  --   --  8.3* 8.4* 8.6* 8.9  MG  --   --   --  2.5* 2.1 2.2 2.4   < > = values in this interval not displayed.   GFR: Estimated Creatinine Clearance: 37.9 mL/min (A) (by C-G formula based on SCr of 1.42 mg/dL (H)). Liver Function Tests: No results for input(s): AST, ALT, ALKPHOS, BILITOT, PROT, ALBUMIN  in the last 168 hours. No results  for input(s): LIPASE, AMYLASE in the last 168 hours.  No results for input(s): AMMONIA in the last 168 hours. Coagulation Profile: No results for input(s): INR, PROTIME in the last 168 hours. Cardiac Enzymes: No results for input(s): CKTOTAL, CKMB, CKMBINDEX, TROPONINI in the last 168 hours. BNP (last 3 results) No results for input(s): PROBNP in the last 8760 hours. HbA1C: Recent Labs    02/10/24 0400  HGBA1C 10.0*   CBG: Recent Labs  Lab 02/11/24 1710 02/11/24 2047 02/11/24  2141 02/12/24 0607 02/12/24 1141  GLUCAP 123* 61* 120* 267* 188*   Lipid Profile: No results for input(s): CHOL, HDL, LDLCALC, TRIG, CHOLHDL, LDLDIRECT in the last 72 hours.  Thyroid  Function Tests: No results for input(s): TSH, T4TOTAL, FREET4, T3FREE, THYROIDAB in the last 72 hours. Anemia Panel: Recent Labs    02/10/24 0800  FERRITIN 100  TIBC 189*  IRON  30   Sepsis Labs: Recent Labs  Lab 02/10/24 0800 02/12/24 0405  PROCALCITON 0.39 0.11    Recent Results (from the past 240 hours)  Culture, blood (Routine X 2) w Reflex to ID Panel     Status: None (Preliminary result)   Collection Time: 02/10/24  2:59 PM   Specimen: BLOOD LEFT HAND  Result Value Ref Range Status   Specimen Description BLOOD LEFT HAND  Final   Special Requests   Final    BOTTLES DRAWN AEROBIC ONLY Blood Culture results may not be optimal due to an inadequate volume of blood received in culture bottles   Culture   Final    NO GROWTH 2 DAYS Performed at [facility], [address] [place], [place] [address]    Report Status PENDING  Incomplete  Culture, blood (Routine X 2) w Reflex to ID Panel     Status: None (Preliminary result)   Collection Time: 02/10/24  2:59 PM   Specimen: BLOOD LEFT HAND  Result Value Ref Range Status   Specimen Description BLOOD LEFT HAND  Final   Special Requests   Final    BOTTLES DRAWN AEROBIC ONLY Blood Culture results may not be  optimal due to an inadequate volume of blood received in culture bottles   Culture   Final    NO GROWTH 2 DAYS Performed at [facility], [address], [place], [place] [address]    Report Status PENDING  Incomplete      Radiology Studies: MR CARDIAC MORPHOLOGY W WO CONTRAST Result Date: 02/11/2024 CLINICAL DATA:  Cardiomyopathy, undefined, further testing COMPARISON: Echo 02/05/24 EXAM: MR CARDIA MORPHOLOGY WITHOUT AND WITH CONTRAST; MR CARDIAC VELOCITY FLOW MAPPING TECHNIQUE: The patient was scanned on a 1.5 Tesla Siemens magnet. A dedicated cardiac coil was used. Functional imaging was done using TrueFisp sequences. 2,3, and 4 chamber views were done to assess for RWMA's. Modified Simpson's rule using a short axis stack was used to calculate an ejection fraction on a dedicated work [employer]. The patient received 10mL GADAVIST  GADOBUTROL  1 MMOL/ML IV SOLN. After 10 minutes inversion recovery sequences were used to assess for infiltration and scar tissue. Phase contrast velocity encoded images obtained x 2. This examination is tailored for evaluation cardiac anatomy and function and provides very limited assessment of noncardiac structures, which are accordingly not evaluated during interpretation. If there is clinical concern for extracardiac pathology, further evaluation with CT imaging should be considered. FINDINGS: LEFT VENTRICLE: Left ventricular chamber size: Normal by indexed volume. Left ventricular wall thickness: Mildly increased. Maximal wall thickness 13 mm. Left ventricular systolic function: Moderately reduced LVEF = 34% There are regional wall motion abnormalities: Mid to apical anterior akinesis. No myocardial edema, T2 = 52 msec Abnormal first pass perfusion, anterior and lateral wall infarct. There is post contrast delayed myocardial enhancement: Mid to apical anterior wall subendocardial LGE, <50% myocardial thickness suggesting potential viability.  Subendocardial basal-apical lateral wall LGE, <50% myocardial thickness suggesting potential viability. Subendocardial mid ventricular inferoseptal LGE that extends to the apex and is >50% myocardial thickness suggesting no  viability, but is focal at the inferoseptum/inferior RV insertion point. Subendocardial apical inferior wall, <50% myocardial thickness, suggesting potential viability. Normal T1 myocardial nulling kinetics suggest against a diagnosis of cardiac amyloidosis. ECV = 30%, nonspecific elevation. RIGHT VENTRICLE: Normal right ventricular chamber size. Normal right ventricular wall thickness. Mildly reduced right ventricular systolic function. RVEF = 48% Mild global hypokinesis. No post contrast delayed myocardial enhancement. ATRIA: Grossly normal. PERICARDIUM: Normal pericardium.  Trivial pericardial effusion. OTHER: Small bilateral pleural effusions. MEASUREMENTS: Qp/Qs: 0.7 VALVES: Tricuspid aortic valve. Aortic valve regurgitation: Trivial, regurgitant fraction 4% Pulmonary valve regurgitation: Moderate, regurgitant fraction 20% Mitral valve regurgitation: Mild, regurgitant fraction 10% Tricuspid valve regurgitation: Mild, regurgitant fraction 17% Left ventricle: LV female LV EF: 34 % (Normal 52-79%) Absolute volumes: LV EDV: (Normal 78-167 mL) LV ESV: (Normal 21-64 mL) LV SV: 58mL (Normal 52-114 mL) CO: 4.9L/min (Normal 2.7-6.3 L/min) Indexed volumes: CI: 2.51L/min/sq-m (Normal 1.9-3.9 L/min/sq-m) LV EDV: 72mL/sq-m (Normal 50-96 mL/sq-m) LV ESV: 79mL/sq-m (Normal 10-40 mL/sq-m) LV SV: 37mL/sq-m (Normal 33-64 mL/sq-m) Right ventricle: RV female RV EF: 48% (normal 52-80%) Absolute volumes: RV EDV: (Normal 79-175 mL) RV ESV: 55mL (Normal 13-75 mL) RV SV: 51mL (Normal 56-110 mL) CO: 4.3L/min (Normal 2.7-6 L/min) Indexed volumes: CI: 2.2L/min/sq-m (Normal 1.8-3.8 L/min/sq-m) RV EDV: 79mL/sq-m (Normal 51-97 mL/sq-m) RV ESV: 22mL/sq-m (Normal 9-42 mL/sq-m) RV SV: 59mL/sq-m (Normal  35-61 mL/sq-m) IMPRESSION: 1. Moderately reduced LV systolic function with grossly normal LV chamber size. LVEF 34%. There is subendocardial scar in the basal to apical lateral, mid to apical anterior, and apical inferior walls with potential viability, with focal transmural likely nonviable myocardium at the mid-apical inferoseptum. Findings in combination suggest ischemic cardiomyopathy. 2. Mildly reduced RV systolic function with normal RV chamber size, RVEF 48%. No delayed myocardial enhancement. 3. Moderate pulmonary valve regurgitation, mild mitral and tricuspid valve regurgitation. Electronically Signed   By: Soyla Merck M.D.   On: 02/11/2024 10:07   MR CARDIAC VELOCITY FLOW MAP Result Date: 02/11/2024 CLINICAL DATA:  Cardiomyopathy, undefined, further testing COMPARISON: Echo 02/05/24 EXAM: MR CARDIA MORPHOLOGY WITHOUT AND WITH CONTRAST; MR CARDIAC VELOCITY FLOW MAPPING TECHNIQUE: The patient was scanned on a 1.5 Tesla Siemens magnet. A dedicated cardiac coil was used. Functional imaging was done using TrueFisp sequences. 2,3, and 4 chamber views were done to assess for RWMA's. Modified Simpson's rule using a short axis stack was used to calculate an ejection fraction on a dedicated work [employer]. The patient received 10mL GADAVIST  GADOBUTROL  1 MMOL/ML IV SOLN. After 10 minutes inversion recovery sequences were used to assess for infiltration and scar tissue. Phase contrast velocity encoded images obtained x 2. This examination is tailored for evaluation cardiac anatomy and function and provides very limited assessment of noncardiac structures, which are accordingly not evaluated during interpretation. If there is clinical concern for extracardiac pathology, further evaluation with CT imaging should be considered. FINDINGS: LEFT VENTRICLE: Left ventricular chamber size: Normal by indexed volume. Left ventricular wall thickness: Mildly increased. Maximal wall thickness 13 mm. Left  ventricular systolic function: Moderately reduced LVEF = 34% There are regional wall motion abnormalities: Mid to apical anterior akinesis. No myocardial edema, T2 = 52 msec Abnormal first pass perfusion, anterior and lateral wall infarct. There is post contrast delayed myocardial enhancement: Mid to apical anterior wall subendocardial LGE, <50% myocardial thickness suggesting potential viability. Subendocardial basal-apical lateral wall LGE, <50% myocardial thickness suggesting potential viability. Subendocardial mid ventricular inferoseptal LGE that extends to the apex and is >50% myocardial  thickness suggesting no viability, but is focal at the inferoseptum/inferior RV insertion point. Subendocardial apical inferior wall, <50% myocardial thickness, suggesting potential viability. Normal T1 myocardial nulling kinetics suggest against a diagnosis of cardiac amyloidosis. ECV = 30%, nonspecific elevation. RIGHT VENTRICLE: Normal right ventricular chamber size. Normal right ventricular wall thickness. Mildly reduced right ventricular systolic function. RVEF = 48% Mild global hypokinesis. No post contrast delayed myocardial enhancement. ATRIA: Grossly normal. PERICARDIUM: Normal pericardium.  Trivial pericardial effusion. OTHER: Small bilateral pleural effusions. MEASUREMENTS: Qp/Qs: 0.7 VALVES: Tricuspid aortic valve. Aortic valve regurgitation: Trivial, regurgitant fraction 4% Pulmonary valve regurgitation: Moderate, regurgitant fraction 20% Mitral valve regurgitation: Mild, regurgitant fraction 10% Tricuspid valve regurgitation: Mild, regurgitant fraction 17% Left ventricle: LV female LV EF: 34 % (Normal 52-79%) Absolute volumes: LV EDV: (Normal 78-167 mL) LV ESV: (Normal 21-64 mL) LV SV: 58mL (Normal 52-114 mL) CO: 4.9L/min (Normal 2.7-6.3 L/min) Indexed volumes: CI: 2.51L/min/sq-m (Normal 1.9-3.9 L/min/sq-m) LV EDV: 51mL/sq-m (Normal 50-96 mL/sq-m) LV ESV: 54mL/sq-m (Normal 10-40 mL/sq-m) LV SV:  89mL/sq-m (Normal 33-64 mL/sq-m) Right ventricle: RV female RV EF: 48% (normal 52-80%) Absolute volumes: RV EDV: (Normal 79-175 mL) RV ESV: 55mL (Normal 13-75 mL) RV SV: 51mL (Normal 56-110 mL) CO: 4.3L/min (Normal 2.7-6 L/min) Indexed volumes: CI: 2.2L/min/sq-m (Normal 1.8-3.8 L/min/sq-m) RV EDV: 15mL/sq-m (Normal 51-97 mL/sq-m) RV ESV: 70mL/sq-m (Normal 9-42 mL/sq-m) RV SV: 15mL/sq-m (Normal 35-61 mL/sq-m) IMPRESSION: 1. Moderately reduced LV systolic function with grossly normal LV chamber size. LVEF 34%. There is subendocardial scar in the basal to apical lateral, mid to apical anterior, and apical inferior walls with potential viability, with focal transmural likely nonviable myocardium at the mid-apical inferoseptum. Findings in combination suggest ischemic cardiomyopathy. 2. Mildly reduced RV systolic function with normal RV chamber size, RVEF 48%. No delayed myocardial enhancement. 3. Moderate pulmonary valve regurgitation, mild mitral and tricuspid valve regurgitation. Electronically Signed   By: Soyla Merck M.D.   On: 02/11/2024 10:07   MR CARDIAC VELOCITY FLOW MAP Result Date: 02/11/2024 CLINICAL DATA:  Cardiomyopathy, undefined, further testing COMPARISON: Echo 02/05/24 EXAM: MR CARDIA MORPHOLOGY WITHOUT AND WITH CONTRAST; MR CARDIAC VELOCITY FLOW MAPPING TECHNIQUE: The patient was scanned on a 1.5 Tesla Siemens magnet. A dedicated cardiac coil was used. Functional imaging was done using TrueFisp sequences. 2,3, and 4 chamber views were done to assess for RWMA's. Modified Simpson's rule using a short axis stack was used to calculate an ejection fraction on a dedicated work [employer]. The patient received 10mL GADAVIST  GADOBUTROL  1 MMOL/ML IV SOLN. After 10 minutes inversion recovery sequences were used to assess for infiltration and scar tissue. Phase contrast velocity encoded images obtained x 2. This examination is tailored for evaluation cardiac anatomy and function and  provides very limited assessment of noncardiac structures, which are accordingly not evaluated during interpretation. If there is clinical concern for extracardiac pathology, further evaluation with CT imaging should be considered. FINDINGS: LEFT VENTRICLE: Left ventricular chamber size: Normal by indexed volume. Left ventricular wall thickness: Mildly increased. Maximal wall thickness 13 mm. Left ventricular systolic function: Moderately reduced LVEF = 34% There are regional wall motion abnormalities: Mid to apical anterior akinesis. No myocardial edema, T2 = 52 msec Abnormal first pass perfusion, anterior and lateral wall infarct. There is post contrast delayed myocardial enhancement: Mid to apical anterior wall subendocardial LGE, <50% myocardial thickness suggesting potential viability. Subendocardial basal-apical lateral wall LGE, <50% myocardial thickness suggesting potential viability. Subendocardial mid ventricular inferoseptal LGE that extends to the apex and  is >50% myocardial thickness suggesting no viability, but is focal at the inferoseptum/inferior RV insertion point. Subendocardial apical inferior wall, <50% myocardial thickness, suggesting potential viability. Normal T1 myocardial nulling kinetics suggest against a diagnosis of cardiac amyloidosis. ECV = 30%, nonspecific elevation. RIGHT VENTRICLE: Normal right ventricular chamber size. Normal right ventricular wall thickness. Mildly reduced right ventricular systolic function. RVEF = 48% Mild global hypokinesis. No post contrast delayed myocardial enhancement. ATRIA: Grossly normal. PERICARDIUM: Normal pericardium.  Trivial pericardial effusion. OTHER: Small bilateral pleural effusions. MEASUREMENTS: Qp/Qs: 0.7 VALVES: Tricuspid aortic valve. Aortic valve regurgitation: Trivial, regurgitant fraction 4% Pulmonary valve regurgitation: Moderate, regurgitant fraction 20% Mitral valve regurgitation: Mild, regurgitant fraction 10% Tricuspid valve  regurgitation: Mild, regurgitant fraction 17% Left ventricle: LV female LV EF: 34 % (Normal 52-79%) Absolute volumes: LV EDV: (Normal 78-167 mL) LV ESV: (Normal 21-64 mL) LV SV: 58mL (Normal 52-114 mL) CO: 4.9L/min (Normal 2.7-6.3 L/min) Indexed volumes: CI: 2.51L/min/sq-m (Normal 1.9-3.9 L/min/sq-m) LV EDV: 34mL/sq-m (Normal 50-96 mL/sq-m) LV ESV: 78mL/sq-m (Normal 10-40 mL/sq-m) LV SV: 61mL/sq-m (Normal 33-64 mL/sq-m) Right ventricle: RV female RV EF: 48% (normal 52-80%) Absolute volumes: RV EDV: (Normal 79-175 mL) RV ESV: 55mL (Normal 13-75 mL) RV SV: 51mL (Normal 56-110 mL) CO: 4.3L/min (Normal 2.7-6 L/min) Indexed volumes: CI: 2.2L/min/sq-m (Normal 1.8-3.8 L/min/sq-m) RV EDV: 37mL/sq-m (Normal 51-97 mL/sq-m) RV ESV: 23mL/sq-m (Normal 9-42 mL/sq-m) RV SV: 39mL/sq-m (Normal 35-61 mL/sq-m) IMPRESSION: 1. Moderately reduced LV systolic function with grossly normal LV chamber size. LVEF 34%. There is subendocardial scar in the basal to apical lateral, mid to apical anterior, and apical inferior walls with potential viability, with focal transmural likely nonviable myocardium at the mid-apical inferoseptum. Findings in combination suggest ischemic cardiomyopathy. 2. Mildly reduced RV systolic function with normal RV chamber size, RVEF 48%. No delayed myocardial enhancement. 3. Moderate pulmonary valve regurgitation, mild mitral and tricuspid valve regurgitation. Electronically Signed   By: Soyla Merck M.D.   On: 02/11/2024 10:07       Scheduled Meds:  aspirin  EC  81 mg Oral Daily   atorvastatin   80 mg Oral Daily   Chlorhexidine  Gluconate Cloth  6 each Topical Daily   clopidogrel   75 mg Oral Daily   DULoxetine   60 mg Oral Daily   ezetimibe   10 mg Oral Daily   fluticasone   2 spray Each Nare QHS   gabapentin   300 mg Oral BID   heparin  injection (subcutaneous)  5,000 Units Subcutaneous Q8H   insulin  aspart  0-9 Units Subcutaneous TID WC   insulin  aspart  10 Units Subcutaneous TID  WC   insulin  glargine-yfgn  14 Units Subcutaneous BID   loratadine   10 mg Oral Daily   pantoprazole   40 mg Oral Daily   predniSONE   80 mg Oral Q breakfast   sacubitril -valsartan   1 tablet Oral BID   sodium chloride  flush  10-40 mL Intracatheter Q12H   Continuous Infusions:     LOS: 7 days   Time spent: 45 minutes   Delon Hoe, DO [facility] 02/12/2024, 3:12 PM   Available via Epic secure chat 7am-7pm After these hours, please refer to coverage provider listed on [email or web address]

## 2024-02-13 ENCOUNTER — Other Ambulatory Visit: Payer: Self-pay

## 2024-02-13 ENCOUNTER — Inpatient Hospital Stay (HOSPITAL_COMMUNITY): Payer: Self-pay | Admitting: Anesthesiology

## 2024-02-13 ENCOUNTER — Encounter (HOSPITAL_COMMUNITY): Payer: Self-pay | Admitting: Hospitalist

## 2024-02-13 ENCOUNTER — Encounter (HOSPITAL_COMMUNITY)
Admission: EM | Disposition: A | Discharge status: Home Health | Payer: Self-pay | Source: Home / Self Care | Attending: Internal Medicine

## 2024-02-13 DIAGNOSIS — Z87891 Personal history of nicotine dependence: Secondary | ICD-10-CM | POA: Diagnosis not present

## 2024-02-13 DIAGNOSIS — R519 Headache, unspecified: Secondary | ICD-10-CM

## 2024-02-13 DIAGNOSIS — M316 Other giant cell arteritis: Secondary | ICD-10-CM | POA: Diagnosis not present

## 2024-02-13 DIAGNOSIS — Z9889 Other specified postprocedural states: Secondary | ICD-10-CM

## 2024-02-13 DIAGNOSIS — I2510 Atherosclerotic heart disease of native coronary artery without angina pectoris: Secondary | ICD-10-CM | POA: Diagnosis not present

## 2024-02-13 DIAGNOSIS — I214 Non-ST elevation (NSTEMI) myocardial infarction: Secondary | ICD-10-CM | POA: Diagnosis not present

## 2024-02-13 DIAGNOSIS — I10 Essential (primary) hypertension: Secondary | ICD-10-CM | POA: Diagnosis not present

## 2024-02-13 HISTORY — PX: ARTERY BIOPSY: SHX891

## 2024-02-13 LAB — BASIC METABOLIC PANEL WITH GFR
Anion gap: 17 — ABNORMAL HIGH (ref 5–15)
BUN: 33 mg/dL — ABNORMAL HIGH (ref 8–23)
CO2: 21 mmol/L — ABNORMAL LOW (ref 22–32)
Calcium: 8.7 mg/dL — ABNORMAL LOW (ref 8.9–10.3)
Chloride: 95 mmol/L — ABNORMAL LOW (ref 98–111)
Creatinine, Ser: 1.56 mg/dL — ABNORMAL HIGH (ref 0.44–1.00)
GFR, Estimated: 36 mL/min — ABNORMAL LOW (ref >60)
Glucose, Bld: 509 mg/dL (ref 70–99)
Potassium: 4.9 mmol/L (ref 3.5–5.1)
Sodium: 133 mmol/L — ABNORMAL LOW (ref 135–145)

## 2024-02-13 LAB — COOXEMETRY PANEL
Carboxyhemoglobin: 1 % (ref 0.5–1.5)
Methemoglobin: <0.7 % (ref 0.0–1.5)
O2 Saturation: 71 %
Total hemoglobin: 12.5 g/dL (ref 12.0–16.0)

## 2024-02-13 LAB — GLUCOSE, CAPILLARY
Glucose-Capillary: 532 mg/dL (ref 70–99)
Glucose-Capillary: 473 mg/dL — ABNORMAL HIGH (ref 70–99)
Glucose-Capillary: 438 mg/dL — ABNORMAL HIGH (ref 70–99)
Glucose-Capillary: 433 mg/dL — ABNORMAL HIGH (ref 70–99)
Glucose-Capillary: 455 mg/dL — ABNORMAL HIGH (ref 70–99)
Glucose-Capillary: 456 mg/dL — ABNORMAL HIGH (ref 70–99)
Glucose-Capillary: 395 mg/dL — ABNORMAL HIGH (ref 70–99)
Glucose-Capillary: 326 mg/dL — ABNORMAL HIGH (ref 70–99)
Glucose-Capillary: 203 mg/dL — ABNORMAL HIGH (ref 70–99)
Glucose-Capillary: 150 mg/dL — ABNORMAL HIGH (ref 70–99)
Glucose-Capillary: 243 mg/dL — ABNORMAL HIGH (ref 70–99)

## 2024-02-13 LAB — CBC
HCT: 35.6 % — ABNORMAL LOW (ref 36.0–46.0)
Hemoglobin: 11.3 g/dL — ABNORMAL LOW (ref 12.0–15.0)
MCH: 29.9 pg (ref 26.0–34.0)
MCHC: 31.7 g/dL (ref 30.0–36.0)
MCV: 94.2 fL (ref 80.0–100.0)
Platelets: 437 K/uL — ABNORMAL HIGH (ref 150–400)
RBC: 3.78 MIL/uL — ABNORMAL LOW (ref 3.87–5.11)
RDW: 14.7 % (ref 11.5–15.5)
WBC: 24.1 K/uL — ABNORMAL HIGH (ref 4.0–10.5)
nRBC: 0 % (ref 0.0–0.2)

## 2024-02-13 LAB — MAGNESIUM: Magnesium: 2.5 mg/dL — ABNORMAL HIGH (ref 1.7–2.4)

## 2024-02-13 SURGERY — BIOPSY TEMPORAL ARTERY
Anesthesia: Monitor Anesthesia Care | Laterality: Right

## 2024-02-13 MED ORDER — FENTANYL CITRATE (PF) 100 MCG/2ML IJ SOLN: 25.0000 ug | INTRAMUSCULAR | Status: DC | PRN

## 2024-02-13 MED ORDER — LIDOCAINE-EPINEPHRINE (PF) 1 %-1:200000 IJ SOLN
INTRAMUSCULAR | Status: AC
Start: 2024-02-13 — End: 2024-02-13
  Filled 2024-02-13: qty 30

## 2024-02-13 MED ORDER — SODIUM CHLORIDE 0.9 % IV SOLN: INTRAVENOUS | Status: DC

## 2024-02-13 MED ORDER — OXYCODONE HCL 5 MG/5ML PO SOLN: 5.0000 mg | Freq: Once | ORAL | Status: DC | PRN

## 2024-02-13 MED ORDER — FUROSEMIDE 40 MG PO TABS
40.0000 mg | ORAL_TABLET | Freq: Every day | ORAL | Status: DC
  Administered 2024-02-14 – 2024-02-15 (×2): 40 mg via ORAL
  Filled 2024-02-13 (×2): qty 1

## 2024-02-13 MED ORDER — CHLORHEXIDINE GLUCONATE 0.12 % MT SOLN
OROMUCOSAL | Status: AC
  Administered 2024-02-13: 15 mL via OROMUCOSAL
  Filled 2024-02-13: qty 15

## 2024-02-13 MED ORDER — ONDANSETRON HCL 4 MG/2ML IJ SOLN
INTRAMUSCULAR | Status: DC | PRN
  Administered 2024-02-13: 4 mg via INTRAVENOUS

## 2024-02-13 MED ORDER — DEXMEDETOMIDINE HCL IN NACL 80 MCG/20ML IV SOLN
INTRAVENOUS | Status: DC | PRN
  Administered 2024-02-13 (×4): 4 ug via INTRAVENOUS

## 2024-02-13 MED ORDER — AMISULPRIDE (ANTIEMETIC) 5 MG/2ML IV SOLN: 10.0000 mg | Freq: Once | INTRAVENOUS | Status: DC | PRN

## 2024-02-13 MED ORDER — DEXMEDETOMIDINE HCL IN NACL 80 MCG/20ML IV SOLN
INTRAVENOUS | Status: AC
  Filled 2024-02-13: qty 20

## 2024-02-13 MED ORDER — ACETAMINOPHEN 500 MG PO TABS
1000.0000 mg | ORAL_TABLET | Freq: Once | ORAL | Status: AC
  Administered 2024-02-13: 1000 mg via ORAL
  Filled 2024-02-13: qty 2

## 2024-02-13 MED ORDER — MIDAZOLAM HCL 2 MG/2ML IJ SOLN
INTRAMUSCULAR | Status: DC | PRN
  Administered 2024-02-13 (×2): 1 mg via INTRAVENOUS

## 2024-02-13 MED ORDER — ONDANSETRON HCL 4 MG/2ML IJ SOLN
INTRAMUSCULAR | Status: AC
  Filled 2024-02-13: qty 4

## 2024-02-13 MED ORDER — LACTATED RINGERS IV SOLN: INTRAVENOUS | Status: DC

## 2024-02-13 MED ORDER — PROPOFOL 500 MG/50ML IV EMUL
INTRAVENOUS | Status: DC | PRN
  Administered 2024-02-13: 25 ug/kg/min via INTRAVENOUS

## 2024-02-13 MED ORDER — LIDOCAINE HCL (PF) 1 % IJ SOLN
INTRAMUSCULAR | Status: AC
  Filled 2024-02-13: qty 30

## 2024-02-13 MED ORDER — OXYCODONE HCL 5 MG PO TABS: 5.0000 mg | ORAL_TABLET | Freq: Once | ORAL | Status: DC | PRN

## 2024-02-13 MED ORDER — CHLORHEXIDINE GLUCONATE 0.12 % MT SOLN: 15.0000 mL | Freq: Once | OROMUCOSAL | Status: AC

## 2024-02-13 MED ORDER — MIDAZOLAM HCL 2 MG/2ML IJ SOLN
INTRAMUSCULAR | Status: AC
  Filled 2024-02-13: qty 2

## 2024-02-13 MED ORDER — INSULIN ASPART 100 UNIT/ML IJ SOLN
10.0000 [IU] | Freq: Once | INTRAMUSCULAR | Status: AC
  Administered 2024-02-13: 10 [IU] via SUBCUTANEOUS

## 2024-02-13 MED ORDER — NOREPINEPHRINE 4 MG/250ML-% IV SOLN
INTRAVENOUS | Status: DC | PRN
Start: 2024-02-13 — End: 2024-02-13
  Administered 2024-02-13: 2 ug/min via INTRAVENOUS

## 2024-02-13 MED ORDER — ORAL CARE MOUTH RINSE: 15.0000 mL | Freq: Once | OROMUCOSAL | Status: AC

## 2024-02-13 MED ORDER — INSULIN ASPART 100 UNIT/ML IJ SOLN: 10.0000 [IU] | Freq: Once | INTRAMUSCULAR | Status: DC

## 2024-02-13 MED ORDER — LIDOCAINE HCL (PF) 1 % IJ SOLN
INTRAMUSCULAR | Status: DC | PRN
  Administered 2024-02-13: 3.5 mL

## 2024-02-13 MED ORDER — INSULIN ASPART 100 UNIT/ML IV SOLN
15.0000 [IU] | Freq: Once | INTRAVENOUS | Status: AC
  Administered 2024-02-13: 15 [IU] via INTRAVENOUS
  Filled 2024-02-13: qty 0.15

## 2024-02-13 MED ORDER — INSULIN GLARGINE-YFGN 100 UNIT/ML ~~LOC~~ SOLN
10.0000 [IU] | Freq: Once | SUBCUTANEOUS | Status: AC
  Administered 2024-02-13: 10 [IU] via SUBCUTANEOUS
  Filled 2024-02-13: qty 0.1

## 2024-02-13 SURGICAL SUPPLY — 36 items
BAG COUNTER SPONGE SURGICOUNT (BAG) ×1 IMPLANT
BENZOIN TINCTURE PRP APPL 2/3 (GAUZE/BANDAGES/DRESSINGS) ×2 IMPLANT
CANISTER SUCTION 3000ML PPV (SUCTIONS) ×1 IMPLANT
CHLORAPREP W/TINT 26 (MISCELLANEOUS) ×1 IMPLANT
CLIP RETRACTION 3.0MM CORONARY (MISCELLANEOUS) IMPLANT
CNTNR URN SCR LID CUP LEK RST (MISCELLANEOUS) ×1 IMPLANT
COTTONBALL LRG STERILE PKG (GAUZE/BANDAGES/DRESSINGS) ×1 IMPLANT
COVER SURGICAL LIGHT HANDLE (MISCELLANEOUS) ×1 IMPLANT
DRAPE OPHTHALMIC 77X100 STRL (CUSTOM PROCEDURE TRAY) ×1 IMPLANT
ELECT NDL BLADE 2-5/6 (NEEDLE) ×1 IMPLANT
ELECT NEEDLE BLADE 2-5/6 (NEEDLE) ×1 IMPLANT
ELECTRODE REM PT RTRN 9FT ADLT (ELECTROSURGICAL) ×1 IMPLANT
GAUZE 4X4 16PLY ~~LOC~~+RFID DBL (SPONGE) ×1 IMPLANT
GAUZE SPONGE 2X2 8PLY STRL LF (GAUZE/BANDAGES/DRESSINGS) IMPLANT
GEL ULTRASOUND 8.5O AQUASONIC (MISCELLANEOUS) ×1 IMPLANT
GLOVE BIO SURGEON STRL SZ8 (GLOVE) ×1 IMPLANT
GOWN STRL REUS W/ TWL LRG LVL3 (GOWN DISPOSABLE) ×1 IMPLANT
GOWN STRL REUS W/ TWL XL LVL3 (GOWN DISPOSABLE) ×1 IMPLANT
KIT BASIN OR (CUSTOM PROCEDURE TRAY) ×1 IMPLANT
KIT TURNOVER KIT B (KITS) ×1 IMPLANT
NDL HYPO 25GX1X1/2 BEV (NEEDLE) ×1 IMPLANT
NEEDLE HYPO 25GX1X1/2 BEV (NEEDLE) ×1 IMPLANT
NS IRRIG 1000ML POUR BTL (IV SOLUTION) ×1 IMPLANT
PACK GENERAL/GYN (CUSTOM PROCEDURE TRAY) ×1 IMPLANT
PAD ARMBOARD POSITIONER FOAM (MISCELLANEOUS) ×2 IMPLANT
SPIKE FLUID TRANSFER (MISCELLANEOUS) ×1 IMPLANT
STRIP CLOSURE SKIN 1/2X4 (GAUZE/BANDAGES/DRESSINGS) ×3 IMPLANT
SUCTION TUBE FRAZIER 10FR DISP (SUCTIONS) ×1 IMPLANT
SUT MNCRL AB 4-0 PS2 18 (SUTURE) ×1 IMPLANT
SUT PROLENE 6 0 BV (SUTURE) IMPLANT
SUT SILK 3-0 18XBRD TIE 12 (SUTURE) IMPLANT
SUT VIC AB 3-0 SH 27X BRD (SUTURE) ×1 IMPLANT
SYR CONTROL 10ML LL (SYRINGE) ×1 IMPLANT
TOWEL GREEN STERILE (TOWEL DISPOSABLE) ×1 IMPLANT
VASCULAR TIE MINI RED 18IN STL (MISCELLANEOUS) ×1 IMPLANT
WATER STERILE IRR 1000ML POUR (IV SOLUTION) ×1 IMPLANT

## 2024-02-13 NOTE — Progress Notes (Addendum)
 Advanced Heart Failure Rounding Note  Chief Complaint:  Subjective:    Coox stable 71% off Milrinone .  BP improved with Enresto. sCr 1.42>1.56. WBC bumped from 16>24.1 (although doubt WBC accurate with BG 509)  Developed worsening HA overnight with jaw claudication. Scheduled for temporal artery biopsy today.   Anxious and tearful this morning, just spoke with her son, feeling lonely. Nervous about her glucose levels this morning and being NPO until the afternoon. No SOB, CP.   Objective:    Vital Signs:   Temp:  [97.5 F (36.4 C)-98.5 F (36.9 C)] 98.1 F (36.7 C) (07/16 0846) Pulse Rate:  [84-98] 98 (07/16 0401) Resp:  [17-20] 20 (07/16 0846) BP: (105-143)/(55-75) 105/75 (07/16 0846) SpO2:  [98 %-100 %] 100 % (07/16 0401) Weight:  [84 kg] 84 kg (07/16 0500) Last BM Date : 02/11/24  Weight change: Filed Weights   02/10/24 0617 02/12/24 0223 02/13/24 0500  Weight: 85.6 kg 84.1 kg 84 kg   Intake/Output:  Intake/Output Summary (Last 24 hours) at 02/13/2024 0848 Last data filed at 02/12/2024 1947 Gross per 24 hour  Intake 110 ml  Output 650 ml  Net -540 ml    Physical Exam: CVP <5 General: Anxious appearing. No distress on RA Cardiac: JVP flat. S1 and S2 present. No murmurs or rub. Extremities: Warm and dry.  No edema.  Neuro: Alert and oriented x3. Affect pleasant. Moves all extremities without difficulty. Lines/Devices:  UE PICC  Telemetry: ST 100s (personally reviewed)  Labs: Basic Metabolic Panel: Recent Labs  Lab 02/09/24 0607 02/10/24 0400 02/11/24 0515 02/12/24 0405 02/13/24 0500  NA 135 135 134* 136 133*  K 3.3* 3.3* 4.2 4.3 4.9  CL 93* 94* 96* 98 95*  CO2 28 31 28 26  21*  GLUCOSE 310* 192* 185* 240* 509*  BUN 44* 35* 28* 25* 33*  CREATININE 1.77* 1.77* 1.58* 1.42* 1.56*  CALCIUM  8.3* 8.4* 8.6* 8.9 8.7*  MG 2.5* 2.1 2.2 2.4 2.5*   CBC: Recent Labs  Lab 02/09/24 0607 02/10/24 0400 02/11/24 0515 02/12/24 0405 02/13/24 0500  WBC 10.8*  14.2* 16.2* 16.1* 24.1*  HGB 9.2* 9.6* 10.0* 10.2* 11.3*  HCT 27.7* 29.6* 30.9* 32.1* 35.6*  MCV 92.0 91.6 92.2 93.3 94.2  PLT 287 341 349 376 437*   BNP (last 3 results) Recent Labs    02/04/24 1959  BNP 1,264.0*   Medications:    Scheduled Medications:  aspirin  EC  81 mg Oral Daily   atorvastatin   80 mg Oral Daily   Chlorhexidine  Gluconate Cloth  6 each Topical Daily   [START ON 02/14/2024] clopidogrel   75 mg Oral Daily   DULoxetine   60 mg Oral Daily   ezetimibe   10 mg Oral Daily   fluticasone   2 spray Each Nare QHS   gabapentin   300 mg Oral BID   heparin  injection (subcutaneous)  5,000 Units Subcutaneous Q8H   insulin  aspart  0-9 Units Subcutaneous TID WC   insulin  aspart  10 Units Subcutaneous TID WC   insulin  glargine-yfgn  10 Units Subcutaneous Once   insulin  glargine-yfgn  14 Units Subcutaneous BID   loratadine   10 mg Oral Daily   pantoprazole   40 mg Oral Daily   predniSONE   85 mg Oral Q breakfast   sacubitril -valsartan   1 tablet Oral BID   sodium chloride  flush  10-40 mL Intracatheter Q12H   Infusions:   PRN Medications: acetaminophen , alum & mag hydroxide-simeth, fentaNYL  (SUBLIMAZE ) injection, ipratropium-albuterol , ondansetron  (ZOFRAN ) IV, sodium chloride , sodium chloride   flush  Assessment/Plan:   1. Acute on chronic systolic CHF: Ischemic cardiomyopathy. Echo this admission with EF 30%, global hypokinesis (prior echo in 2020 with normal EF). 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   - cMRI 7/14: LVEF 34%, RVEF 48%, LGE LAD territory with some viability, nonviable myocardium at mid-apical inferoseptum. Findings suggest ischemic cardiomyopathy. - Stable co-ox off milrinone  - Euvolemic, CVP <5. Will need to restart diuretics at discharge hold today with afternoon procedure and NPO (Lasix  40 mg daily) - Continue Entresto  24/26 mg bid - No SGLT2i with type I  diabetes  - Hold on starting spiro with K 4.9 today - Off beta blocker with low-output - Will have Pharmacy team check on cost of HF meds  2. AKI on CKD stage 3b: Cr up to 2.4. Suspect cardiorenal syndrome, with initial contrast bolus from CTA chest playing a role. sCr stable 1.5  - Continue to follow   3. CAD: H/o CABG x 2.  NSTEMI at presentation with HS-TnI to 2900.  Cath showed patent LIMA-LAD and SVG-PDA with unchanged native vessels (occluded RCA, occluded OMs). No target for intervention.  - Continue ASA 81/Plavix .  - Continue atorvastatin  and Zetia .  - No s/s angina  4. PAD: Followed as outpatient by Dr. Darron.  - No rest pain  - Would not restart pletal  at discharge, contraindicated in CHF  5. 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  - Improved with diuresis. Outpatient f/u  6. Anemia: Hgb stable in 10s - Iron  stores are low.  - Given IV Fe yesterday  7. Hypokalemia - Resolved  8. Leukocytosis: WBCs up to 16K. Afebrile. - PCT 0.39>0.11, BC X 2 NG X 2 days, UA on admit okay - No clear evidence of infection. 2/2 Acute MI?  9. DM I - Uncontrolled, A1c 10% - Followed by endocrine in outpatient setting  Stable from HF Standpoint.   Length of Stay: 8  [place] Lee, NP  02/13/2024, 8:48 AM  Advanced Heart Failure Team Pager [phone] (M-F; 7a - 4p)  Please contact [facility] for night-coverage after hours (4p -7a ) and weekends on [email or web address]  Patient seen and examined with the above-signed Advanced Practice Provider and/or Housestaff. I personally reviewed laboratory data, imaging studies and relevant notes. I independently examined the patient and formulated the important aspects of the plan. I have edited the note to reflect any of my changes or salient points. I have personally discussed the plan with the patient and/or family.  Co-ox stable off milrinone .  Volume status looks good. Denies CP or SOB   General:  Sitting up in bed  No resp difficulty HEENT: normal Neck: supple. no JVD.  Cor: Regular rate & rhythm. No rubs, gallops or murmurs. Lungs: clear Abdomen: soft, nontender, nondistended. No hepatosplenomegaly. No bruits or masses. Good bowel sounds. Extremities: no cyanosis, clubbing, rash, edema Neuro: alert & orientedx3, cranial nerves grossly intact. moves all 4 extremities w/o difficulty. Affect pleasant  She is stable from HF perspective. GDMT limited by K 4.9 and type 1 DM. Continue entresto . And restart torsemide. No b-blocker at this time.   We will arrange for outpatient f/u. Will need to consider ICD if EF  remains < 35%  Case discussed personally with VVS. OK for TA biopsy under local anesthesia   We will s/o.   Toribio  Aycen Porreca, MD  10:40 AM

## 2024-02-13 NOTE — Anesthesia Preprocedure Evaluation (Addendum)
 Anesthesia Evaluation  Patient identified by MRN, date of birth, ID band Patient awake    Reviewed: Allergy & Precautions, H&P , NPO status , Patient's Chart, lab work & pertinent test results  History of Anesthesia Complications (+) PONV and history of anesthetic complications  Airway Mallampati: II  TM Distance: >3 FB Neck ROM: Full    Dental no notable dental hx. (+) Dental Advisory Given, Poor Dentition   Pulmonary sleep apnea , former smoker   Pulmonary exam normal breath sounds clear to auscultation       Cardiovascular Exercise Tolerance: Good hypertension, Pt. on medications and Pt. on home beta blockers + CAD, + Past MI, + Peripheral Vascular Disease and +CHF  Normal cardiovascular exam Rhythm:Regular Rate:Normal     Neuro/Psych   Anxiety      Neuromuscular disease negative neurological ROS  negative psych ROS   GI/Hepatic negative GI ROS, Neg liver ROS, hiatal hernia,GERD  ,,  Endo/Other  diabetes, Type 1    Renal/GU CRFRenal diseasenegative Renal ROS  negative genitourinary   Musculoskeletal negative musculoskeletal ROS (+) Arthritis ,    Abdominal   Peds negative pediatric ROS (+)  Hematology negative hematology ROS (+) Blood dyscrasia, anemia   Anesthesia Other Findings   Reproductive/Obstetrics negative OB ROS                              Anesthesia Physical Anesthesia Plan  ASA: 4 and emergent  Anesthesia Plan: MAC   Post-op Pain Management: Minimal or no pain anticipated   Induction: Intravenous  PONV Risk Score and Plan: 3 and Ondansetron  and Treatment may vary due to age or medical condition  Airway Management Planned: Natural Airway and Nasal Cannula  Additional Equipment: None  Intra-op Plan:   Post-operative Plan:   Informed Consent: I have reviewed the patients History and Physical, chart, labs and discussed the procedure including the risks,  benefits and alternatives for the proposed anesthesia with the patient or authorized representative who has indicated his/her understanding and acceptance.       Plan Discussed with: Anesthesiologist and CRNA  Anesthesia Plan Comments: (1. Acute on chronic systolic CHF: Ischemic cardiomyopathy. Echo this admission with EF 30%, global hypokinesis (prior echo in 2020 with normal EF). 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   - cMRI 7/14: LVEF 34%, RVEF 48%, LGE LAD territory with some viability, nonviable myocardium at mid-apical inferoseptum. Findings suggest ischemic cardiomyopathy. - Stable co-ox off milrinone  - Euvolemic, CVP <5. Will need to restart diuretics at discharge hold today with afternoon procedure and NPO (Lasix  40 mg daily) - Continue Entresto  24/26 mg bid - No SGLT2i with type I diabetes  - Hold on starting spiro with K 4.9 today - Off beta blocker with low-output - Will have Pharmacy team check on cost of HF meds   2. AKI on CKD stage 3b: Cr up to 2.4. Suspect cardiorenal syndrome, with initial contrast bolus from CTA chest playing a role. sCr stable 1.5  - Continue to follow    3. CAD: H/o CABG x 2.  NSTEMI at presentation with HS-TnI to 2900.  Cath showed patent LIMA-LAD and SVG-PDA with unchanged native vessels (occluded RCA, occluded OMs). No target for intervention.  - Continue ASA 81/Plavix .  - Continue atorvastatin  and Zetia .  - No s/s angina   4. PAD: Followed as outpatient by Dr. Darron.  -  No rest pain  - Would not restart pletal  at discharge, contraindicated in CHF   5. 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  - Improved with diuresis. Outpatient f/u   6. Anemia: Hgb stable in 10s - Iron  stores are low.  - Given IV Fe yesterday   7.  Hypokalemia - Resolved   8. Leukocytosis: WBCs up to 16K. Afebrile. - PCT 0.39>0.11, BC X 2 NG X 2 days, UA on admit okay - No clear evidence of infection. 2/2 Acute MI?   9. DM I - Uncontrolled, A1c 10% - Followed by endocrine in outpatient setting   Stable from HF Standpoint.  )         Anesthesia Quick Evaluation

## 2024-02-13 NOTE — Progress Notes (Signed)
Patient back from procedure.

## 2024-02-13 NOTE — Transfer of Care (Signed)
 Immediate Anesthesia Transfer of Care Note  Patient: Kiara [name]  Procedure(s) Performed: BIOPSY TEMPORAL ARTERY (Right)  Patient Location: PACU  Anesthesia Type:MAC  Level of Consciousness: awake, alert , and oriented  Airway & Oxygen Therapy: Patient Spontanous Breathing  Post-op Assessment: Report given to RN and Post -op Vital signs reviewed and stable  Post vital signs: Reviewed and stable  Last Vitals:  Vitals Value Taken Time  BP 105/44 02/13/24 14:15  Temp    Pulse 87 02/13/24 14:17  Resp 7 02/13/24 14:17  SpO2 96 % 02/13/24 14:17  Vitals shown include unfiled device data.  Last Pain:  Vitals:   02/13/24 1247  TempSrc:   PainSc: 0-No pain      Patients Stated Pain Goal: 3 (02/13/24 1246)  Complications: No notable events documented.

## 2024-02-13 NOTE — Progress Notes (Signed)
 [facility] [place] PROGRESS NOTE  ASSESSMENT / PLAN: Kiara [name] is a 73 y.o. female with possible temporal arteritis. Plan R temporal artery biopsy today in OR.   SUBJECTIVE: Reviewed OR plan. No complaints.   OBJECTIVE: BP (!) (P) 154/65   Pulse (P) 99   Temp (P) 97.7 F (36.5 C) (Oral)   Resp (P) 17   Ht (P) 5' 3 (1.6 m)   Wt (P) 85 kg   SpO2 (P) 100%   BMI (P) 33.19 kg/m   Intake/Output Summary (Last 24 hours) at 02/13/2024 1251 Last data filed at 02/13/2024 1004 Gross per 24 hour  Intake 110 ml  Output 1150 ml  Net -1040 ml    No distress Regular rate and rhythm Unlabored breathing     Latest Ref Rng & Units 02/13/2024    5:00 AM 02/12/2024    4:05 AM 02/11/2024    5:15 AM  CBC  WBC 4.0 - 10.5 K/uL 24.1  16.1  16.2   Hemoglobin 12.0 - 15.0 g/dL 88.6  89.7  89.9   Hematocrit 36.0 - 46.0 % 35.6  32.1  30.9   Platelets 150 - 400 K/uL 437  376  349         Latest Ref Rng & Units 02/13/2024    5:00 AM 02/12/2024    4:05 AM 02/11/2024    5:15 AM  CMP  Glucose 70 - 99 mg/dL 490  759  814   BUN 8 - 23 mg/dL 33  25  28   Creatinine 0.44 - 1.00 mg/dL 8.43  8.57  8.41   Sodium 135 - 145 mmol/L 133  136  134   Potassium 3.5 - 5.1 mmol/L 4.9  4.3  4.2   Chloride 98 - 111 mmol/L 95  98  96   CO2 22 - 32 mmol/L 21  26  28    Calcium  8.9 - 10.3 mg/dL 8.7  8.9  8.6     Estimated Creatinine Clearance: 34.4 mL/min (A) (by C-G formula based on SCr of 1.56 mg/dL (H)).  Debby SAILOR. Magda, MD [facility] Phone Number: [phone] 02/13/2024 12:51 PM

## 2024-02-13 NOTE — Progress Notes (Signed)
 Mobility Specialist Progress Note:    02/13/24 1012  Mobility  Activity Ambulated with assistance to bathroom  Level of Assistance Standby assist, set-up cues, supervision of patient - no hands on  Assistive Device Front wheel walker  Distance Ambulated (ft) 12 ft  Activity Response Tolerated well  Mobility Referral Yes  Mobility visit 1 Mobility  Mobility Specialist Start Time (ACUTE ONLY) 1012  Mobility Specialist Stop Time (ACUTE ONLY) 1030  Mobility Specialist Time Calculation (min) (ACUTE ONLY) 18 min   Pt received in bathroom, pt had unexpected episode of diarrhea requiring linen change. Assisted with peri care and returned pt back to bed via RW. Pt c/o dizziness and shakiness. BP 112/69 (80). Pt lying in bed with all needs met, call bell in reach.  Shamela Haydon Mobility Specialist Please contact via Special educational needs teacher or  Rehab office at [phone]

## 2024-02-13 NOTE — Op Note (Signed)
 DATE OF SERVICE: 02/13/2024  PATIENT:  Kiara [name]  73 y.o. female  PRE-OPERATIVE DIAGNOSIS:  possible temporal arteritis  POST-OPERATIVE DIAGNOSIS:  Same  PROCEDURE:   Right temporal artery biopsy (CPT [phone])  SURGEON:  Surgeons and Role:    * Magda Debby SAILOR, MD - Primary  ASSISTANT: none  ANESTHESIA:   local and MAC  EBL: minimal  BLOOD ADMINISTERED:none  DRAINS: none   LOCAL MEDICATIONS USED:  LIDOCAINE    SPECIMEN:  none  COUNTS: confirmed correct.  TOURNIQUET:  none  PATIENT DISPOSITION:  PACU - hemodynamically stable.   Delay start of Pharmacological VTE agent (>24hrs) due to surgical blood loss or risk of bleeding: no  INDICATION FOR PROCEDURE: TELIYAH [name] is a 73 y.o. female with possible temporal arteritis. After careful discussion of risks, benefits, and alternatives the patient was offered temporal artery biopsy. The patient understood and wished to proceed.  OPERATIVE FINDINGS: unremarkable temporal artery biopsy.  DESCRIPTION OF PROCEDURE: After identification of the patient in the pre-operative holding area, the patient was transferred to the operating room. The patient was positioned supine on the operating room table. Anesthesia was induced. The right temporal scalp was prepped and draped in standard fashion. A surgical pause was performed confirming correct patient, procedure, and operative location.  Intraoperative ultrasound was used to map the course of the right temporal artery.  Marking the skin was made to plantar incision.  Incision was made with a 15 blade and carried down to subcutaneous tissue.  The temporal artery was identified for several centimeters.  Arterial pulsatility was confirmed with the Doppler machine.  Silk suture was placed around the proximal and distal margin of the specimen.  The distal margin of the specimen was transected and arterial pulsatile flow was noted through the cut end of the artery.  The suture was secured.   The proximal suture was secured.  The specimen was excised and passed off the table for permanent pathologic evaluation.  Upon completion of the case instrument and sharps counts were confirmed correct. The patient was transferred to the PACU in good condition. I was present for all portions of the procedure.  Debby SAILOR. Magda, MD [facility] Phone Number: [phone] 02/13/2024 2:49 PM

## 2024-02-13 NOTE — Progress Notes (Signed)
 PROGRESS NOTE    Kiara [name]  FMW:[id number] DOB: Aug 27, 1950 DOA: 02/04/2024 PCP: Lawrance Handing, MD   Brief Narrative:  Kiara [name] is a 73 y.o. female with medical history significant of HFpEF, CAD s/p CABG in 2014, CKD3, HTN, HLD, and DM2 p/w cp and found to have elevated troponin and BNP c/f ACS.    Pt states that she was in her usual state of health until 0800 when she awoke from her sleep w/ acute onset SOB. She endorses intermittent CP lasting 5-14min over the past few days that worsens with activities like walking/talking and improves with rest. She reports following a strict low sodium diet and denies any recent weight gain or LE edema to accompany her SOB.    In the ED, pt tachycardic and tachypneic on 4L [place]. Labs notable for K 3.2, Cr 1.65, BNP 1264, troponin 2778-->2969, and WBC 15.9. CTA chest neg for PE, but did show b/l pleural effusions. Pt started on hep gtt for ACS, cards consulted per EDP, and pt admitted to medicine for ongoing w/u.   Patient underwent heart cath 7/11. She was diuresed well with IV lasix .   Assessment & Plan:   Principal Problem:   NSTEMI (non-ST elevated myocardial infarction) (HCC) Active Problems:   Essential hypertension   Anxiety   HLD (hyperlipidemia)   Acute respiratory failure with hypoxia (HCC)   Acute on chronic combined systolic and diastolic CHF (congestive heart failure) (HCC)   ACS (acute coronary syndrome) (HCC)   Uncontrolled type 2 diabetes mellitus with hyperglycemia, with long-term current use of insulin  (HCC)   AKI (acute kidney injury) (HCC)   CKD stage 3b, GFR 30-44 ml/min (HCC)  NSTEMI - Appreciate cardiology, s/p heart cath 7/11 - no targets for PCI  - Aspirin , Plavix , lipitor     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 It is sharp and sudden in nature, on her right temple and near her ear, also with jaw claudication.  - CRP 4.3 - ESR 121 - Started  prednisone  02/12/2024, vascular surgery consulted, patient is scheduled for temporal artery biopsy today.(last dose asa/plavix  7/15 AM)    Acute hypoxic respiratory failure secondary to acute on chronic systolic and diastolic heart failure Ischemic cardiomyopathy  - EF 30% on the echo this admission. - Advanced heart failure team consulted, started milrinone  --> now discontinued - Cardiac MRI suggestive of ischemic cardiomyopathy  - Diuretics per heart failure team  - Started on Entresto .  Patient required 2 L of oxygen at some point in time but now on room air.   AKI on CKD stage IIIb - Baseline creatinine 1.7, creatinine peaked at 2.42 on 02/07/2024, improved now back to baseline.   Hyperlipidemia - Lipitor     Diabetes mellitus type 2, uncontrolled with hyperglycemia - Hemoglobin A1c 10.6 in April 2025, patient has been on 14 units of Lantus  twice daily, 10 units of NovoLog  Premeal 3 times daily as well as sensitive SSI.  However significantly hyperglycemic blood sugar around 500 this morning because patient refused long-acting insulin  last night.  Will give her extra dose of long-acting insulin  this morning.   Leukocytosis - Infectious work up has been negative.  Procalcitonin unremarkable.  Leukocytosis was stable between 12-16 until prednisone  was started and then jumped to 24.  No fever  Chronic intermittent hyponatremia: 133 today.  Patient is asymptomatic.  DVT prophylaxis: heparin  injection 5,000 Units Start: 02/08/24 1400   Code Status: Full Code  Family Communication:  2 family members present at the bedside. Plan of care discussed with patient in length and he/she verbalized understanding and agreed with it.  Status is: Inpatient Remains inpatient appropriate because: Awaiting temporal biopsy later today.   Estimated body mass index is 32.8 kg/m as calculated from the following:   Height as of this encounter: 5' 3 (1.6 m).   Weight as of this encounter: 84 kg.     Nutritional Assessment: Body mass index is 32.8 kg/m.SABRA Seen by dietician.  I agree with the assessment and plan as outlined below: Nutrition Status:        . Skin Assessment: I have examined the patient's skin and I agree with the wound assessment as performed by the wound care RN as outlined below:    Consultants:  Vascular surgery and cardiology  Procedures:  As above  Antimicrobials:  Anti-infectives (From admission, onward)    None         Subjective: Patient seen and examined.  She is feeling much better.  Still complains of right temporal pain but only when asked.  Also states that it is better and intermittent as opposed to worse and constant that it was yesterday day before.  No problem with the vision of the right eye.  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  Objective: Vitals:   02/12/24 1943 02/12/24 2353 02/13/24 0401 02/13/24 0500  BP: (!) 143/55 134/65 (!) 128/59   Pulse: 84 94 98   Resp: 18 17 18    Temp: 97.9 F (36.6 C) (!) 97.5 F (36.4 C) 98 F (36.7 C)   TempSrc: Oral Oral Oral   SpO2: 100% 100% 100%   Weight:    84 kg  Height:        Intake/Output Summary (Last 24 hours) at 02/13/2024 0809 Last data filed at 02/12/2024 1947 Gross per 24 hour  Intake 110 ml  Output 650 ml  Net -540 ml   Filed Weights   02/10/24 0617 02/12/24 0223 02/13/24 0500  Weight: 85.6 kg 84.1 kg 84 kg    Examination:  General exam: Appears calm and comfortable, very mild tenderness at the right temporal area. Respiratory system: Clear to auscultation. Respiratory effort normal. Cardiovascular system: S1 & S2 heard, RRR. No JVD, murmurs, rubs, gallops or clicks. No pedal edema. Gastrointestinal system: Abdomen is nondistended, soft and nontender. No organomegaly or masses felt. Normal bowel sounds heard. Central nervous system: Alert and oriented. No focal neurological deficits. Extremities: Symmetric 5 x 5 power. Skin: No rashes, lesions or  ulcers Psychiatry: Judgement and insight appear normal. Mood & affect appropriate.    Data Reviewed: I have personally reviewed following labs and imaging studies  CBC: Recent Labs  Lab 02/09/24 0607 02/10/24 0400 02/11/24 0515 02/12/24 0405 02/13/24 0500  WBC 10.8* 14.2* 16.2* 16.1* 24.1*  HGB 9.2* 9.6* 10.0* 10.2* 11.3*  HCT 27.7* 29.6* 30.9* 32.1* 35.6*  MCV 92.0 91.6 92.2 93.3 94.2  PLT 287 341 349 376 437*   Basic Metabolic Panel: Recent Labs  Lab 02/09/24 0607 02/10/24 0400 02/11/24 0515 02/12/24 0405 02/13/24 0500  NA 135 135 134* 136 133*  K 3.3* 3.3* 4.2 4.3 4.9  CL 93* 94* 96* 98 95*  CO2 28 31 28 26  21*  GLUCOSE 310* 192* 185* 240* 509*  BUN 44* 35* 28* 25* 33*  CREATININE 1.77* 1.77* 1.58* 1.42* 1.56*  CALCIUM  8.3* 8.4* 8.6* 8.9 8.7*  MG 2.5* 2.1 2.2 2.4 2.5*   GFR: Estimated Creatinine Clearance:  34.4 mL/min (A) (by C-G formula based on SCr of 1.56 mg/dL (H)). Liver Function Tests: No results for input(s): AST, ALT, ALKPHOS, BILITOT, PROT, ALBUMIN  in the last 168 hours. No results for input(s): LIPASE, AMYLASE in the last 168 hours. No results for input(s): AMMONIA in the last 168 hours. Coagulation Profile: No results for input(s): INR, PROTIME in the last 168 hours. Cardiac Enzymes: No results for input(s): CKTOTAL, CKMB, CKMBINDEX, TROPONINI in the last 168 hours. BNP (last 3 results) No results for input(s): PROBNP in the last 8760 hours. HbA1C: No results for input(s): HGBA1C in the last 72 hours. CBG: Recent Labs  Lab 02/12/24 0607 02/12/24 1141 02/12/24 1620 02/12/24 2114 02/13/24 0623  GLUCAP 267* 188* 196* 160* 532*   Lipid Profile: No results for input(s): CHOL, HDL, LDLCALC, TRIG, CHOLHDL, LDLDIRECT in the last 72 hours. Thyroid  Function Tests: No results for input(s): TSH, T4TOTAL, FREET4, T3FREE, THYROIDAB in the last 72 hours. Anemia Panel: No results for input(s):  VITAMINB12, FOLATE, FERRITIN, TIBC, IRON , RETICCTPCT in the last 72 hours. Sepsis Labs: Recent Labs  Lab 02/10/24 0800 02/12/24 0405  PROCALCITON 0.39 0.11    Recent Results (from the past 240 hours)  Culture, blood (Routine X 2) w Reflex to ID Panel     Status: None (Preliminary result)   Collection Time: 02/10/24  2:59 PM   Specimen: BLOOD LEFT HAND  Result Value Ref Range Status   Specimen Description BLOOD LEFT HAND  Final   Special Requests   Final    BOTTLES DRAWN AEROBIC ONLY Blood Culture results may not be optimal due to an inadequate volume of blood received in culture bottles   Culture   Final    NO GROWTH 3 DAYS Performed at [facility], [address] [place], [place] [address]    Report Status PENDING  Incomplete  Culture, blood (Routine X 2) w Reflex to ID Panel     Status: None (Preliminary result)   Collection Time: 02/10/24  2:59 PM   Specimen: BLOOD LEFT HAND  Result Value Ref Range Status   Specimen Description BLOOD LEFT HAND  Final   Special Requests   Final    BOTTLES DRAWN AEROBIC ONLY Blood Culture results may not be optimal due to an inadequate volume of blood received in culture bottles   Culture   Final    NO GROWTH 3 DAYS Performed at [facility], [address] [place], [place] [address]    Report Status PENDING  Incomplete     Radiology Studies: No results found.  Scheduled Meds:  aspirin  EC  81 mg Oral Daily   atorvastatin   80 mg Oral Daily   Chlorhexidine  Gluconate Cloth  6 each Topical Daily   [START ON 02/14/2024] clopidogrel   75 mg Oral Daily   DULoxetine   60 mg Oral Daily   ezetimibe   10 mg Oral Daily   fluticasone   2 spray Each Nare QHS   gabapentin   300 mg Oral BID   heparin  injection (subcutaneous)  5,000 Units Subcutaneous Q8H   insulin  aspart  0-9 Units Subcutaneous TID WC   insulin  aspart  10 Units Subcutaneous TID WC   insulin  glargine-yfgn  14 Units Subcutaneous BID   loratadine   10 mg Oral  Daily   pantoprazole   40 mg Oral Daily   predniSONE   85 mg Oral Q breakfast   sacubitril -valsartan   1 tablet Oral BID   sodium chloride  flush  10-40 mL Intracatheter Q12H  Continuous Infusions:   LOS: 8 days   Fredia Skeeter, MD [facility]  02/13/2024, 8:09 AM   *Please note that this is a verbal dictation therefore any spelling or grammatical errors are due to the Dragon Medical One system interpretation.  Please page via Amion and do not message via secure chat for urgent patient care matters. Secure chat can be used for non urgent patient care matters.  How to contact the [facility] Attending or Consulting provider 7A - 7P or covering provider during after hours 7P -7A, for this patient?  Check the care team in [facility] and look for a) attending/consulting [facility] provider listed and b) the [facility] team listed. Page or secure chat 7A-7P. Log into [email or web address] and use [place]'s universal password to access. If you do not have the password, please contact the hospital operator. Locate the [facility] provider you are looking for under [facility] and page to a number that you can be directly reached. If you still have difficulty reaching the provider, please page the [facility] (Director on Call) for the Hospitalists listed on amion for assistance.

## 2024-02-13 NOTE — Inpatient Diabetes Management (Signed)
 Inpatient Diabetes Program Recommendations  AACE/ADA: New Consensus Statement on Inpatient Glycemic Control (2015)  Target Ranges:  Prepandial:   less than 140 mg/dL      Peak postprandial:   less than 180 mg/dL (1-2 hours)      Critically ill patients:  140 - 180 mg/dL   Lab Results  Component Value Date   GLUCAP 456 (H) 02/13/2024   HGBA1C 10.0 (H) 02/10/2024    Review of Glycemic Control  Latest Reference Range & Units 02/12/24 16:20 02/12/24 21:14 02/13/24 06:23 02/13/24 08:46 02/13/24 09:49 02/13/24 11:14 02/13/24 12:29 02/13/24 14:20  Glucose-Capillary 70 - 99 mg/dL 803 (H) 839 (H) 467 (HH) 473 (H) 438 (H) 433 (H) 455 (H) 456 (H)  Diabetes history: DM1 (Requires basal, meal coverage and correction insulin ) Outpatient Diabetes medications: Medtronic Insulin  Pump From office note with [place] Rio 11/27/23: 7a-12a basal rate to 1.55 units per hour instead of 2 units per hour given she has had some recent drops. She can continue other settings: target 150; insulin  to carb ratio: 1:15, insulin  sensitivity of 50, active insulin  time of 4 hrs.  Current orders for Inpatient glycemic control:  Novolog  0-9 units tid correction Semglee  14 units bid Novolog  10 units tid with meals   Inpatient Diabetes Program Recommendations:    Note patient elevated >500 mg/dL this morning.  If CBG remains>400 mg/dL, may need IV insulin ?   Thanks,  Randall Bullocks, RN, BC-ADM Inpatient Diabetes Coordinator Pager [phone]  (8a-5p)

## 2024-02-14 ENCOUNTER — Encounter (HOSPITAL_COMMUNITY): Payer: Self-pay | Admitting: Vascular Surgery

## 2024-02-14 ENCOUNTER — Encounter (HOSPITAL_COMMUNITY): Payer: Self-pay

## 2024-02-14 ENCOUNTER — Telehealth (HOSPITAL_COMMUNITY): Payer: Self-pay

## 2024-02-14 ENCOUNTER — Other Ambulatory Visit (HOSPITAL_COMMUNITY): Payer: Self-pay

## 2024-02-14 DIAGNOSIS — I214 Non-ST elevation (NSTEMI) myocardial infarction: Secondary | ICD-10-CM | POA: Diagnosis not present

## 2024-02-14 LAB — GLUCOSE, CAPILLARY
Glucose-Capillary: 530 mg/dL (ref 70–99)
Glucose-Capillary: 548 mg/dL (ref 70–99)
Glucose-Capillary: 465 mg/dL — ABNORMAL HIGH (ref 70–99)
Glucose-Capillary: 435 mg/dL — ABNORMAL HIGH (ref 70–99)
Glucose-Capillary: 325 mg/dL — ABNORMAL HIGH (ref 70–99)

## 2024-02-14 LAB — CBC WITH DIFFERENTIAL/PLATELET
Abs Immature Granulocytes: 0.39 K/uL — ABNORMAL HIGH (ref 0.00–0.07)
Basophils Absolute: 0.1 K/uL (ref 0.0–0.1)
Basophils Relative: 0 %
Eosinophils Absolute: 0 K/uL (ref 0.0–0.5)
Eosinophils Relative: 0 %
HCT: 33.8 % — ABNORMAL LOW (ref 36.0–46.0)
Hemoglobin: 10.8 g/dL — ABNORMAL LOW (ref 12.0–15.0)
Immature Granulocytes: 1 %
Lymphocytes Relative: 5 %
Lymphs Abs: 1.6 K/uL (ref 0.7–4.0)
MCH: 29.9 pg (ref 26.0–34.0)
MCHC: 32 g/dL (ref 30.0–36.0)
MCV: 93.6 fL (ref 80.0–100.0)
Monocytes Absolute: 0.5 K/uL (ref 0.1–1.0)
Monocytes Relative: 2 %
Neutro Abs: 29.1 K/uL — ABNORMAL HIGH (ref 1.7–7.7)
Neutrophils Relative %: 92 %
Platelets: 495 K/uL — ABNORMAL HIGH (ref 150–400)
RBC: 3.61 MIL/uL — ABNORMAL LOW (ref 3.87–5.11)
RDW: 14.9 % (ref 11.5–15.5)
WBC: 31.7 K/uL — ABNORMAL HIGH (ref 4.0–10.5)
nRBC: 0 % (ref 0.0–0.2)

## 2024-02-14 LAB — BASIC METABOLIC PANEL WITH GFR
Anion gap: 12 (ref 5–15)
BUN: 44 mg/dL — ABNORMAL HIGH (ref 8–23)
CO2: 23 mmol/L (ref 22–32)
Calcium: 8.7 mg/dL — ABNORMAL LOW (ref 8.9–10.3)
Chloride: 97 mmol/L — ABNORMAL LOW (ref 98–111)
Creatinine, Ser: 1.72 mg/dL — ABNORMAL HIGH (ref 0.44–1.00)
GFR, Estimated: 32 mL/min — ABNORMAL LOW (ref >60)
Glucose, Bld: 504 mg/dL (ref 70–99)
Potassium: 4.8 mmol/L (ref 3.5–5.1)
Sodium: 132 mmol/L — ABNORMAL LOW (ref 135–145)

## 2024-02-14 LAB — COOXEMETRY PANEL
Carboxyhemoglobin: 2.2 % — ABNORMAL HIGH (ref 0.5–1.5)
Methemoglobin: 1 % (ref 0.0–1.5)
O2 Saturation: 68.4 %
Total hemoglobin: 11.1 g/dL — ABNORMAL LOW (ref 12.0–16.0)

## 2024-02-14 MED ORDER — INSULIN ASPART 100 UNIT/ML IJ SOLN
0.0000 [IU] | Freq: Three times a day (TID) | INTRAMUSCULAR | Status: DC
  Administered 2024-02-14 (×2): 15 [IU] via SUBCUTANEOUS

## 2024-02-14 MED ORDER — INSULIN ASPART 100 UNIT/ML IV SOLN
15.0000 [IU] | Freq: Once | INTRAVENOUS | Status: AC
  Administered 2024-02-14: 15 [IU] via INTRAVENOUS

## 2024-02-14 MED ORDER — INSULIN GLARGINE-YFGN 100 UNIT/ML ~~LOC~~ SOLN
25.0000 [IU] | Freq: Two times a day (BID) | SUBCUTANEOUS | Status: DC
  Administered 2024-02-14 – 2024-02-15 (×3): 25 [IU] via SUBCUTANEOUS
  Filled 2024-02-14 (×4): qty 0.25

## 2024-02-14 MED ORDER — INSULIN ASPART 100 UNIT/ML IJ SOLN
0.0000 [IU] | INTRAMUSCULAR | Status: DC
  Administered 2024-02-14: 15 [IU] via SUBCUTANEOUS
  Administered 2024-02-15 (×2): 4 [IU] via SUBCUTANEOUS
  Administered 2024-02-15: 3 [IU] via SUBCUTANEOUS

## 2024-02-14 MED ORDER — INSULIN ASPART 100 UNIT/ML IJ SOLN
15.0000 [IU] | Freq: Once | INTRAMUSCULAR | Status: AC
  Administered 2024-02-14: 15 [IU] via SUBCUTANEOUS

## 2024-02-14 MED ORDER — ZOLPIDEM TARTRATE 5 MG PO TABS
5.0000 mg | ORAL_TABLET | Freq: Every evening | ORAL | Status: DC | PRN
  Administered 2024-02-14: 5 mg via ORAL
  Filled 2024-02-14: qty 1

## 2024-02-14 NOTE — Telephone Encounter (Signed)
 Advanced Heart Failure Patient Advocate Encounter  The patient was approved for a [employer] grant that will help cover the cost of Entresto .  Total amount awarded, $4,500.  Effective: 01/15/2024 - 01/13/2025.  BIN [id number] PCN [id number] Group [id number] ID [id number]  Approval and processing information added to [facility]. Patient informed via voicemail, pt will receive an ID card by mail within the next 1-2 weeks, or can contact me and I will reach out to preferred pharmacy to provide billing information.  Rachel DEL, CPhT Rx Patient Advocate Phone: [phone]

## 2024-02-14 NOTE — Progress Notes (Signed)
 PROGRESS NOTE    Kiara [name]  FMW:[id number] DOB: Aug 25, 1950 DOA: 02/04/2024 PCP: Lawrance Handing, MD   Brief Narrative:  Kiara [name] is a 73 y.o. female with medical history significant of HFpEF, CAD s/p CABG in 2014, CKD3, HTN, HLD, and DM2 p/w cp and found to have elevated troponin and BNP c/f ACS.    Pt states that she was in her usual state of health until 0800 when she awoke from her sleep w/ acute onset SOB. She endorses intermittent CP lasting 5-59min over the past few days that worsens with activities like walking/talking and improves with rest. She reports following a strict low sodium diet and denies any recent weight gain or LE edema to accompany her SOB.    In the ED, pt tachycardic and tachypneic on 4L [place]. Labs notable for K 3.2, Cr 1.65, BNP 1264, troponin 2778-->2969, and WBC 15.9. CTA chest neg for PE, but did show b/l pleural effusions. Pt started on hep gtt for ACS, cards consulted per EDP, and pt admitted to medicine for ongoing w/u.   Patient underwent heart cath 7/11. She was diuresed well with IV lasix .   Assessment & Plan:   Principal Problem:   NSTEMI (non-ST elevated myocardial infarction) (HCC) Active Problems:   Essential hypertension   Anxiety   HLD (hyperlipidemia)   Acute respiratory failure with hypoxia (HCC)   Acute on chronic combined systolic and diastolic CHF (congestive heart failure) (HCC)   ACS (acute coronary syndrome) (HCC)   Uncontrolled type 2 diabetes mellitus with hyperglycemia, with long-term current use of insulin  (HCC)   AKI (acute kidney injury) (HCC)   CKD stage 3b, GFR 30-44 ml/min (HCC)  NSTEMI - Appreciate cardiology, s/p heart cath 7/11 - no targets for PCI  - Aspirin , Plavix , lipitor     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 It is sharp and sudden in nature, on her right temple and near her ear, also with jaw claudication.  - CRP 4.3 - ESR 121 - Started  prednisone  02/12/2024, vascular surgery consulted, temporal biopsy completed 02/13/2024, results pending.   Acute hypoxic respiratory failure secondary to acute on chronic systolic and diastolic heart failure Ischemic cardiomyopathy  - EF 30% on the echo this admission. - Advanced heart failure team consulted, started milrinone  --> now discontinued - Cardiac MRI suggestive of ischemic cardiomyopathy  - Diuretics per heart failure team  - Started on Entresto .  Patient required 2 L of oxygen at some point in time but now on room air.  Resumed diuretics.  Cleared by cardiology for discharge.   AKI on CKD stage IIIb - Baseline creatinine 1.7, creatinine peaked at 2.42 on 02/07/2024, improved now back to baseline.   Hyperlipidemia - Lipitor     Diabetes mellitus type 2, uncontrolled with hyperglycemia - Hemoglobin A1c 10.6 in April 2025, patient has been on 14 units of Lantus  twice daily, 10 units of NovoLog  Premeal 3 times daily as well as sensitive SSI.  However she has been significantly hyperglycemic since yesterday due to combination of her refusing Lantus  the night before and being on high-dose of steroids.  We gave her extra dose of Lantus  yesterday morning along with 15 units of IV NovoLog .  This helped but she is significantly hyperglycemic this morning.  Will give her another 15 units of IV NovoLog  and increase Lantus  to 25 units twice daily while she is on high-dose prednisone .   Leukocytosis - Infectious work up has been negative.  Procalcitonin unremarkable.  Leukocytosis was stable between 12-16 until prednisone  was started and then jumped to 30.  No fever  Chronic intermittent hyponatremia: 133 today.  Patient is asymptomatic.  DVT prophylaxis: heparin  injection 5,000 Units Start: 02/08/24 1400   Code Status: Full Code  Family Communication: 2 family members present at the bedside. Plan of care discussed with patient in length and he/she verbalized understanding and agreed with  it.  Status is: Inpatient Remains inpatient appropriate because: Awaiting temporal biopsy later today and she is significantly hyperglycemic.   Estimated body mass index is 33.18 kg/m (pended) as calculated from the following:   Height as of this encounter: (P) 5' 3 (1.6 m).   Weight as of this encounter: 85 kg.    Nutritional Assessment: Body mass index is 33.18 kg/m (pended).. Seen by dietician.  I agree with the assessment and plan as outlined below: Nutrition Status:        . Skin Assessment: I have examined the patient's skin and I agree with the wound assessment as performed by the wound care RN as outlined below:    Consultants:  Vascular surgery and cardiology  Procedures:  As above  Antimicrobials:  Anti-infectives (From admission, onward)    None         Subjective: Seen and examined.  Feels better.  Temporal pain has improved in intensity and frequency.  She understands that her blood sugar is elevated and it is not safe to discharge her today.  Objective: Vitals:   02/13/24 2309 02/14/24 0314 02/14/24 0601 02/14/24 0750  BP: 135/68 (!) 134/57  (!) 93/57  Pulse: 100 92  88  Resp: 18 18  18   Temp: 98 F (36.7 C) 97.7 F (36.5 C)  98.4 F (36.9 C)  TempSrc: Oral Oral  Axillary  SpO2: 100% 100%  90%  Weight:   85 kg   Height:        Intake/Output Summary (Last 24 hours) at 02/14/2024 0828 Last data filed at 02/13/2024 1500 Gross per 24 hour  Intake 400 ml  Output 502 ml  Net -102 ml   Filed Weights   02/13/24 0500 02/13/24 1227 02/14/24 0601  Weight: 84 kg (P) 85 kg 85 kg    Examination:  General exam: Appears calm and comfortable  Respiratory system: Clear to auscultation. Respiratory effort normal. Cardiovascular system: S1 & S2 heard, RRR. No JVD, murmurs, rubs, gallops or clicks. No pedal edema. Gastrointestinal system: Abdomen is nondistended, soft and nontender. No organomegaly or masses felt. Normal bowel sounds  heard. Central nervous system: Alert and oriented. No focal neurological deficits. Extremities: Symmetric 5 x 5 power. Skin: No rashes, lesions or ulcers.  Psychiatry: Judgement and insight appear normal. Mood & affect appropriate.   Data Reviewed: I have personally reviewed following labs and imaging studies  CBC: Recent Labs  Lab 02/10/24 0400 02/11/24 0515 02/12/24 0405 02/13/24 0500 02/14/24 0445  WBC 14.2* 16.2* 16.1* 24.1* 31.7*  NEUTROABS  --   --   --   --  29.1*  HGB 9.6* 10.0* 10.2* 11.3* 10.8*  HCT 29.6* 30.9* 32.1* 35.6* 33.8*  MCV 91.6 92.2 93.3 94.2 93.6  PLT 341 349 376 437* 495*   Basic Metabolic Panel: Recent Labs  Lab 02/09/24 0607 02/10/24 0400 02/11/24 0515 02/12/24 0405 02/13/24 0500 02/14/24 0445  NA 135 135 134* 136 133* 132*  K 3.3* 3.3* 4.2 4.3 4.9 4.8  CL 93* 94* 96* 98 95* 97*  CO2 28 31 28  26  21* 23  GLUCOSE 310* 192* 185* 240* 509* 504*  BUN 44* 35* 28* 25* 33* 44*  CREATININE 1.77* 1.77* 1.58* 1.42* 1.56* 1.72*  CALCIUM  8.3* 8.4* 8.6* 8.9 8.7* 8.7*  MG 2.5* 2.1 2.2 2.4 2.5*  --    GFR: Estimated Creatinine Clearance: 31.4 mL/min (A) (by C-G formula based on SCr of 1.72 mg/dL (H)). Liver Function Tests: No results for input(s): AST, ALT, ALKPHOS, BILITOT, PROT, ALBUMIN  in the last 168 hours. No results for input(s): LIPASE, AMYLASE in the last 168 hours. No results for input(s): AMMONIA in the last 168 hours. Coagulation Profile: No results for input(s): INR, PROTIME in the last 168 hours. Cardiac Enzymes: No results for input(s): CKTOTAL, CKMB, CKMBINDEX, TROPONINI in the last 168 hours. BNP (last 3 results) No results for input(s): PROBNP in the last 8760 hours. HbA1C: No results for input(s): HGBA1C in the last 72 hours. CBG: Recent Labs  Lab 02/13/24 1735 02/13/24 1845 02/13/24 2116 02/14/24 0557 02/14/24 0645  GLUCAP 203* 150* 243* 530* 548*   Lipid Profile: No results for input(s):  CHOL, HDL, LDLCALC, TRIG, CHOLHDL, LDLDIRECT in the last 72 hours. Thyroid  Function Tests: No results for input(s): TSH, T4TOTAL, FREET4, T3FREE, THYROIDAB in the last 72 hours. Anemia Panel: No results for input(s): VITAMINB12, FOLATE, FERRITIN, TIBC, IRON , RETICCTPCT in the last 72 hours. Sepsis Labs: Recent Labs  Lab 02/10/24 0800 02/12/24 0405  PROCALCITON 0.39 0.11    Recent Results (from the past 240 hours)  Culture, blood (Routine X 2) w Reflex to ID Panel     Status: None (Preliminary result)   Collection Time: 02/10/24  2:59 PM   Specimen: BLOOD LEFT HAND  Result Value Ref Range Status   Specimen Description BLOOD LEFT HAND  Final   Special Requests   Final    BOTTLES DRAWN AEROBIC ONLY Blood Culture results may not be optimal due to an inadequate volume of blood received in culture bottles   Culture   Final    NO GROWTH 4 DAYS Performed at [facility], [address] [place], [place] [address]    Report Status PENDING  Incomplete  Culture, blood (Routine X 2) w Reflex to ID Panel     Status: None (Preliminary result)   Collection Time: 02/10/24  2:59 PM   Specimen: BLOOD LEFT HAND  Result Value Ref Range Status   Specimen Description BLOOD LEFT HAND  Final   Special Requests   Final    BOTTLES DRAWN AEROBIC ONLY Blood Culture results may not be optimal due to an inadequate volume of blood received in culture bottles   Culture   Final    NO GROWTH 4 DAYS Performed at [facility], [address] [place], [place] [address]    Report Status PENDING  Incomplete     Radiology Studies: No results found.  Scheduled Meds:  aspirin  EC  81 mg Oral Daily   atorvastatin   80 mg Oral Daily   Chlorhexidine  Gluconate Cloth  6 each Topical Daily   clopidogrel   75 mg Oral Daily   DULoxetine   60 mg Oral Daily   ezetimibe   10 mg Oral Daily   fluticasone   2 spray Each Nare QHS   furosemide   40 mg Oral Daily   gabapentin   300 mg  Oral BID   heparin  injection (subcutaneous)  5,000 Units Subcutaneous Q8H   insulin  aspart  0-9 Units Subcutaneous TID WC   insulin  aspart  10 Units Subcutaneous TID  WC   insulin  aspart  15 Units Intravenous Once   insulin  glargine-yfgn  25 Units Subcutaneous BID   loratadine   10 mg Oral Daily   pantoprazole   40 mg Oral Daily   predniSONE   85 mg Oral Q breakfast   sacubitril -valsartan   1 tablet Oral BID   sodium chloride  flush  10-40 mL Intracatheter Q12H   Continuous Infusions:   LOS: 9 days   Fredia Skeeter, MD [facility]  02/14/2024, 8:28 AM   *Please note that this is a verbal dictation therefore any spelling or grammatical errors are due to the Dragon Medical One system interpretation.  Please page via Amion and do not message via secure chat for urgent patient care matters. Secure chat can be used for non urgent patient care matters.  How to contact the [facility] Attending or Consulting provider 7A - 7P or covering provider during after hours 7P -7A, for this patient?  Check the care team in [facility] and look for a) attending/consulting TRH provider listed and b) the TRH team listed. Page or secure chat 7A-7P. Log into [email or web address] and use [facility] universal password to access. If you do not have the password, please contact the hospital operator. Locate the [facility] provider you are looking for under [facility] and page to a number that you can be directly reached. If you still have difficulty reaching the provider, please page the [facility] (Director on Call) for the Hospitalists listed on amion for assistance.

## 2024-02-14 NOTE — TOC Initial Note (Addendum)
 Transition of Care [facility]) - Initial/Assessment Note    Patient Details  Name: Kiara [name] MRN: [id number] Date of Birth: [birth year]  Transition [facility]) CM/SW Contact:    Justina Delcia Czar, RN Phone Number: [phone] 02/14/2024, 1:41 PM   Clinical Narrative:                 Spoke to pt and offered choice for [facility], pt prefers Regulatory affairs officer. Contacted agency and they have delay in [facility]. Contacted [facility] HH rep, Channing and she will review. Will send referral to [facility] if unable to accept referral due to possible copay.  Contacted [employer], Walter for a RW for home. She has Rollator at home. Lives alone. Her adult children live next door to her and will assist with her care.  Has scale at home for daily weights. Educate on low sodium/heart healthy diet. States she sees a Diabetic Nutritionist outpatient. Monitors her blood sugar closely at home. Has blood pressure cuff, pulse ox, transport wheelchair, hospital bed (will purchase mattress if she decides to use), Rollator and scale.  Family will provide transportation home.  Will continue to follow for dc needs.     Expected Discharge Plan: Home w Home Health Services Barriers to Discharge: Continued Medical Work up   Patient Goals and CMS Choice Patient states their goals for this hospitalization and ongoing recovery are:: wants to remain independent   Choice offered to / list presented to : Patient      Expected Discharge Plan and Services   Discharge Planning Services: CM Consult Post Acute Care Choice: Home Health Living arrangements for the past 2 months: Single Family Home                 DME Arranged: Walker rolling DME Agency: [facility] Date DME Agency Contacted: 02/14/24 Time DME Agency Contacted: 1336 Representative spoke with at DME Agency: [facility] Arranged: RN, PT [facility] Agency: [facility] Date [facility] Agency Contacted: 02/14/24 Time HH Agency Contacted: [phone] Representative spoke with  at [facility] Agency: Channing  Prior Living Arrangements/Services Living arrangements for the past 2 months: Single Family Home Lives with:: Self Patient language and need for interpreter reviewed:: Yes Do you feel safe going back to the place where you live?: Yes      Need for Family Participation in Patient Care: Yes (Comment) Care giver support system in place?: Yes (comment) Current home services: DME (transport wheelchair, rollator, scale) Criminal Activity/Legal Involvement Pertinent to Current Situation/Hospitalization: No - Comment as needed  Activities of Daily Living   ADL Screening (condition at time of admission) Independently performs ADLs?: Yes (appropriate for developmental age) Is the patient deaf or have difficulty hearing?: No Does the patient have difficulty seeing, even when wearing glasses/contacts?: No Does the patient have difficulty concentrating, remembering, or making decisions?: No  Permission Sought/Granted Permission sought to share information with : Case Manager, PCP, Family Supports Permission granted to share information with : Yes, Verbal Permission Granted  Share Information with NAME: Jamariyah Johannsen  Permission granted to share info w AGENCY: Home Health, PCP, DME  Permission granted to share info w Relationship: son  Permission granted to share info w Contact Information: [phone]  Emotional Assessment Appearance:: Appears stated age Attitude/Demeanor/Rapport: Engaged Affect (typically observed): Accepting Orientation: : Oriented to Self, Oriented to Place, Oriented to  Time, Oriented to Situation   Psych Involvement: No (comment)  Admission diagnosis:  ACS (acute coronary syndrome) (HCC) [I24.9] NSTEMI (non-ST elevated myocardial infarction) (  HCC) [I21.4] Hemoptysis [R04.2] Patient Active Problem List   Diagnosis Date Noted   Uncontrolled type 2 diabetes mellitus with hyperglycemia, with long-term current use of insulin  (HCC) 02/06/2024   AKI  (acute kidney injury) (HCC) 02/06/2024   CKD stage 3b, GFR 30-44 ml/min (HCC) 02/06/2024   ACS (acute coronary syndrome) (HCC) 02/05/2024   Chronic diastolic heart failure (HCC) 10/09/2023   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) 10/27/2022   Diabetes mellitus with coincident hypertension (HCC) 10/14/2020   Coronary artery disease involving coronary bypass graft of native heart without angina pectoris 03/25/2020   Acute on chronic combined systolic and diastolic CHF (congestive heart failure) (HCC) 03/25/2020   Stage 3a chronic kidney disease (HCC) 03/25/2020   Bilateral carotid artery disease (HCC) 03/25/2020   Educated about COVID-19 virus infection 03/25/2020   Acute CHF (congestive heart failure) (HCC) 08/02/2017   NSTEMI (non-ST elevated myocardial infarction) (HCC) 08/02/2017   Acute respiratory failure with hypoxia (HCC) 08/02/2017   SOB (shortness of breath)    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   Coronary atherosclerosis due to severely calcified coronary lesion 03/21/2013   HLD (hyperlipidemia) 03/21/2013   PAD (peripheral artery disease) (HCC) 03/21/2013   Unstable chest pain due to insufficient blood supply to heart (HCC) 03/20/2013   Decreased circulation 03/20/2013   Type 1 diabetes (HCC) 03/20/2013   Sleep apnea 03/20/2013   Essential hypertension 03/20/2013   GERD (gastroesophageal reflux disease) 03/20/2013   Diabetic retinopathy (HCC) 03/20/2013   Anxiety 03/20/2013   Umbilical hernia 09/25/2008   PCP:  Lawrance Handing, MD Pharmacy:   [facility] - [place], [place] - [address] [place] [address] Phone: [phone] Fax: [phone]  [id number] - [place], [place] - [address] [facility] [place] [address] Phone: [phone] Fax: [phone]  [facility] [address] [place] [address] Phone: [phone] Fax:  [phone]     Social Drivers of Health (SDOH) Social History: SDOH Screenings   Food Insecurity: No Food Insecurity (02/06/2024)  Housing: Low Risk  (02/06/2024)  Transportation Needs: No Transportation Needs (02/06/2024)  Utilities: Not At Risk (02/06/2024)  Social Connections: Moderately Integrated (02/06/2024)  Tobacco Use: Medium Risk (02/13/2024)   SDOH Interventions: Transportation Interventions: Inpatient TOC, Patient Resources (Friends/Family), Intervention Not Indicated   Readmission Risk Interventions    02/07/2024    2:46 PM  Readmission Risk Prevention Plan  Transportation Screening Complete  PCP or Specialist Appt within 5-7 Days Complete  Home Care Screening Complete  Medication Review (RN CM) Referral to Pharmacy

## 2024-02-14 NOTE — Plan of Care (Signed)
 RN reports that patient's blood glucose 530 today morning even before breakfast.  Giving NovoLog  15 units and plan to recheck blood glucose again in 30 minutes.

## 2024-02-14 NOTE — Anesthesia Postprocedure Evaluation (Signed)
 Anesthesia Post Note  Patient: MOE BRIER  Procedure(s) Performed: BIOPSY TEMPORAL ARTERY (Right)     Patient location during evaluation: PACU Anesthesia Type: MAC Level of consciousness: awake and alert Pain management: pain level controlled Vital Signs Assessment: post-procedure vital signs reviewed and stable Respiratory status: spontaneous breathing, nonlabored ventilation, respiratory function stable and patient connected to nasal cannula oxygen Cardiovascular status: stable and blood pressure returned to baseline Postop Assessment: no apparent nausea or vomiting Anesthetic complications: no   No notable events documented.  Last Vitals:  Vitals:   02/14/24 0314 02/14/24 0750  BP: (!) 134/57 (!) 93/57  Pulse: 92 88  Resp: 18 18  Temp: 36.5 C 36.9 C  SpO2: 100% 90%    Last Pain:  Vitals:   02/14/24 0750  TempSrc: Axillary  PainSc: 0-No pain                 Luisdaniel [place]on

## 2024-02-14 NOTE — Progress Notes (Addendum)
 Advanced Heart Failure Rounding Note  HF Cardiologist: Dr. Rolan  Chief Complaint: Acute on chronic systolic CHF  Subjective:    CO-OX 68% off milrinone .  CVP not hooked up.  Labile blood sugars overnight. Had trouble with belching/indigestion but denies dyspnea, orthopnea, PND.    Objective:    Vital Signs:   Temp:  [97.7 F (36.5 C)-98.4 F (36.9 C)] 97.7 F (36.5 C) (07/17 1102) Pulse Rate:  [84-100] 87 (07/17 1102) Resp:  [10-18] 17 (07/17 1102) BP: (93-135)/(39-68) 127/64 (07/17 1102) SpO2:  [90 %-100 %] 99 % (07/17 1102) Weight:  [85 kg] 85 kg (07/17 0601) Last BM Date : 02/11/24  Weight change: Filed Weights   02/13/24 0500 02/13/24 1227 02/14/24 0601  Weight: 84 kg (P) 85 kg 85 kg   Intake/Output:  Intake/Output Summary (Last 24 hours) at 02/14/2024 1335 Last data filed at 02/13/2024 1500 Gross per 24 hour  Intake 400 ml  Output 2 ml  Net 398 ml    Physical Exam: General:  Sitting up in chair. No distress.  Neck: no JVD Cor: Regular rate & rhythm. No murmurs. Lungs: clear Abdomen: soft, nontender, nondistended.  Extremities: no edema Neuro: alert & orientedx3. Affect pleasant   Telemetry: SR 90s  Labs: Basic Metabolic Panel: Recent Labs  Lab 02/09/24 0607 02/10/24 0400 02/11/24 0515 02/12/24 0405 02/13/24 0500 02/14/24 0445  NA 135 135 134* 136 133* 132*  K 3.3* 3.3* 4.2 4.3 4.9 4.8  CL 93* 94* 96* 98 95* 97*  CO2 28 31 28 26  21* 23  GLUCOSE 310* 192* 185* 240* 509* 504*  BUN 44* 35* 28* 25* 33* 44*  CREATININE 1.77* 1.77* 1.58* 1.42* 1.56* 1.72*  CALCIUM  8.3* 8.4* 8.6* 8.9 8.7* 8.7*  MG 2.5* 2.1 2.2 2.4 2.5*  --    CBC: Recent Labs  Lab 02/10/24 0400 02/11/24 0515 02/12/24 0405 02/13/24 0500 02/14/24 0445  WBC 14.2* 16.2* 16.1* 24.1* 31.7*  NEUTROABS  --   --   --   --  29.1*  HGB 9.6* 10.0* 10.2* 11.3* 10.8*  HCT 29.6* 30.9* 32.1* 35.6* 33.8*  MCV 91.6 92.2 93.3 94.2 93.6  PLT 341 349 376 437* 495*   BNP (last 3  results) Recent Labs    02/04/24 1959  BNP 1,264.0*   Medications:    Scheduled Medications:  aspirin  EC  81 mg Oral Daily   atorvastatin   80 mg Oral Daily   Chlorhexidine  Gluconate Cloth  6 each Topical Daily   clopidogrel   75 mg Oral Daily   DULoxetine   60 mg Oral Daily   ezetimibe   10 mg Oral Daily   fluticasone   2 spray Each Nare QHS   furosemide   40 mg Oral Daily   gabapentin   300 mg Oral BID   heparin  injection (subcutaneous)  5,000 Units Subcutaneous Q8H   insulin  aspart  0-15 Units Subcutaneous TID WC   insulin  aspart  10 Units Subcutaneous TID WC   insulin  glargine-yfgn  25 Units Subcutaneous BID   loratadine   10 mg Oral Daily   pantoprazole   40 mg Oral Daily   predniSONE   85 mg Oral Q breakfast   sacubitril -valsartan   1 tablet Oral BID   sodium chloride  flush  10-40 mL Intracatheter Q12H   Infusions:   PRN Medications: acetaminophen , alum & mag hydroxide-simeth, fentaNYL  (SUBLIMAZE ) injection, ipratropium-albuterol , ondansetron  (ZOFRAN ) IV, sodium chloride , sodium chloride  flush  Assessment/Plan:   1. Acute on chronic systolic CHF: Ischemic cardiomyopathy. Echo this admission with EF  30%, global hypokinesis (prior echo in 2020 with normal EF). 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   - cMRI 7/14: LVEF 34%, RVEF 48%, LGE LAD territory with some viability, nonviable myocardium at mid-apical inferoseptum. Findings suggest ischemic cardiomyopathy. - Stable off milrinone . - Appears euvolemic. Continue lasix  40 mg daily. Watch renal function. Scr slightly higher today. - Continue Entresto  24/26 mg bid, copay for entresto  is $177 (patient assistance pending) - No SGLT2i with type I diabetes  - Hold off on spiro, K upper normal - Off beta blocker with low-output  2. AKI on CKD stage 3b: Cr up to 2.4. Suspect cardiorenal syndrome, with initial contrast bolus from CTA  chest playing a role. sCr up slightly today, 1.7. Will follow. - Continue to follow   3. CAD: H/o CABG x 2.  NSTEMI at presentation with HS-TnI to 2900.  Cath showed patent LIMA-LAD and SVG-PDA with unchanged native vessels (occluded RCA, occluded OMs). No target for intervention.  - Continue ASA 81/Plavix .  - Continue atorvastatin  and Zetia .  - No s/s angina  4. PAD: Followed as outpatient by Dr. Darron.  - No rest pain  - Would not restart pletal  at discharge, contraindicated in CHF  5. 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  - Improved with diuresis. Outpatient f/u  6. Anemia: Hgb stable - Iron  stores are low.  - Given IV Fe this admit  7. Hypokalemia - Resolved  8. Leukocytosis: WBCs up to 30 K. Afebrile. - PCT 0.39>0.11, BC X 2 NG, UA on admit okay - No clear evidence of infection.  - Likely exacerbated by steroid use, significant jump after starting prednisone   9. DM I - Uncontrolled, A1c 10% - Followed by endocrine in outpatient setting  10. Possible temporal arteritis - Biopsy pending - Now on prednisone  - ESR and CRP elevated  Stable from HF standpoint. Has close follow-up in [facility] scheduled.  Length of Stay: 9  FINCH, LINDSAY N, PA-C  02/14/2024, 1:35 PM  Advanced Heart Failure Team Pager [phone] (M-F; 7a - 4p)  Please contact [facility] for night-coverage after hours (4p -7a ) and weekends on [email or web address]  Patient seen and examined with the above-signed Advanced Practice Provider and/or Housestaff. I personally reviewed laboratory data, imaging studies and relevant notes. I independently examined the patient and formulated the important aspects of the plan. I have edited the note to reflect any of my changes or salient points. I have personally discussed the plan with the patient and/or family.  Blood sugars markedly elevated  today and has some agitation in setting of high-dose prednisone  for possible TA  Off milrinone . Co-ox ok. Volume status ok   General:  Sitting up in chair No resp difficulty HEENT: normal Neck: supple. no JVD.  Cor: PMI nondisplaced. Regular rate & rhythm. No rubs, gallops or murmurs. Lungs: clear Abdomen: obese soft, nontender, nondistended. No hepatosplenomegaly. No bruits or masses. Good bowel sounds. Extremities: no cyanosis, clubbing, rash, edema Neuro: alert & orientedx3, cranial nerves grossly intact. moves all 4 extremities w/o difficulty. Affect pleasant  HF is well compensated currently. Would continue current regimen. If remains on prednisone  will have to watch fluid status closely and adjust diuretics as needed.   Ok for d/c from HF perspective. We will sign  off and arrange outpatient f/u.   Toribio  Sumie Remsen, MD  4:50 PM

## 2024-02-14 NOTE — Telephone Encounter (Signed)
 Error

## 2024-02-14 NOTE — Inpatient Diabetes Management (Signed)
 Inpatient Diabetes Program Recommendations  AACE/ADA: New Consensus Statement on Inpatient Glycemic Control (2015)  Target Ranges:  Prepandial:   less than 140 mg/dL      Peak postprandial:   less than 180 mg/dL (1-2 hours)      Critically ill patients:  140 - 180 mg/dL   Lab Results  Component Value Date   GLUCAP 465 (H) 02/14/2024   HGBA1C 10.0 (H) 02/10/2024    Review of Glycemic Control  Latest Reference Range & Units 02/13/24 21:16 02/14/24 05:57 02/14/24 06:45 02/14/24 11:05  Glucose-Capillary 70 - 99 mg/dL 756 (H) 469 (HH) 451 (HH) 465 (H)  Diabetes history: DM1 (Requires basal, meal coverage and correction insulin ) Outpatient Diabetes medications: [employer] Insulin  Pump From office note with Benton Rio 11/27/23: 7a-12a basal rate to 1.55 units per hour instead of 2 units per hour given she has had some recent drops. She can continue other settings: target 150; insulin  to carb ratio: 1:15, insulin  sensitivity of 50, active insulin  time of 4 hrs.  Current orders for Inpatient glycemic control:  Novolog  0-15 units tid correction Semglee  25 units bid Novolog  10 units tid with meals  Prednisone  85 mg daily Inpatient Diabetes Program Recommendations:   Note increases in insulins.  While on steroids, consider changing Novolog  correction to q 4 hours.   ** When steroids are tapered, patient will need reduction in insulin  back to previous doses prior to steroids.   Thanks,  Randall Bullocks, RN, BC-ADM Inpatient Diabetes Coordinator Pager [phone]  (8a-5p)

## 2024-02-15 ENCOUNTER — Telehealth (HOSPITAL_COMMUNITY): Payer: Self-pay

## 2024-02-15 ENCOUNTER — Other Ambulatory Visit (HOSPITAL_COMMUNITY): Payer: Self-pay

## 2024-02-15 DIAGNOSIS — I214 Non-ST elevation (NSTEMI) myocardial infarction: Secondary | ICD-10-CM | POA: Diagnosis not present

## 2024-02-15 LAB — CULTURE, BLOOD (ROUTINE X 2)
Culture: NO GROWTH
Culture: NO GROWTH

## 2024-02-15 LAB — GLUCOSE, CAPILLARY
Glucose-Capillary: 148 mg/dL — ABNORMAL HIGH (ref 70–99)
Glucose-Capillary: 73 mg/dL (ref 70–99)
Glucose-Capillary: 66 mg/dL — ABNORMAL LOW (ref 70–99)
Glucose-Capillary: 175 mg/dL — ABNORMAL HIGH (ref 70–99)
Glucose-Capillary: 188 mg/dL — ABNORMAL HIGH (ref 70–99)
Glucose-Capillary: 175 mg/dL — ABNORMAL HIGH (ref 70–99)

## 2024-02-15 LAB — SURGICAL PATHOLOGY

## 2024-02-15 MED ORDER — ATORVASTATIN CALCIUM 80 MG PO TABS
80.0000 mg | ORAL_TABLET | Freq: Every day | ORAL | 0 refills | Status: AC
Start: 2024-02-16 — End: 2025-06-05
  Filled 2024-02-15: qty 30, 30d supply, fill #0

## 2024-02-15 MED ORDER — CLOPIDOGREL BISULFATE 75 MG PO TABS
75.0000 mg | ORAL_TABLET | Freq: Every day | ORAL | 0 refills | Status: AC
  Filled 2024-02-15: qty 30, 30d supply, fill #0

## 2024-02-15 MED ORDER — DEXTROSE 50 % IV SOLN
INTRAVENOUS | Status: AC
  Administered 2024-02-15: 50 mL
  Filled 2024-02-15: qty 50

## 2024-02-15 MED ORDER — GABAPENTIN 300 MG PO CAPS
300.0000 mg | ORAL_CAPSULE | Freq: Two times a day (BID) | ORAL | 0 refills | Status: AC
  Filled 2024-02-15: qty 60, 30d supply, fill #0

## 2024-02-15 MED ORDER — SACUBITRIL-VALSARTAN 24-26 MG PO TABS
1.0000 | ORAL_TABLET | Freq: Two times a day (BID) | ORAL | 0 refills | Status: DC
  Filled 2024-02-15: qty 60, 30d supply, fill #0

## 2024-02-15 MED ORDER — FUROSEMIDE 40 MG PO TABS
40.0000 mg | ORAL_TABLET | Freq: Every day | ORAL | 0 refills | Status: AC
  Filled 2024-02-15: qty 30, 30d supply, fill #0

## 2024-02-15 NOTE — Inpatient Diabetes Management (Signed)
 Per MD, patient will not d/c on steroids at discharge.  Therefore assisted patient in changing pump settings back to 1 units/15 grams of CHO and correction factor back to 1 unit/50 mg/dL.  Patient appreciative and plans to restart pump and sensor when she gets home.   Thanks,  Randall Bullocks, RN, BC-ADM Inpatient Diabetes Coordinator Pager [phone]  (8a-5p)

## 2024-02-15 NOTE — Progress Notes (Signed)
 Hypoglycemic Event  CBG: 66  Treatment: D50 50 mL (25 gm)  Symptoms: Heavy sleep  Follow-up CBG: Time: 6:10 AM CBG Result:173  Possible Reasons for Event: Medication regimen  Comments/MD notified:No    Lonzell Bennie Cassis

## 2024-02-15 NOTE — Progress Notes (Signed)
  Progress Note    02/15/2024 1:11 PM 2 Days Post-Op  Subjective: Headaches resolved  Vitals:   02/15/24 0731 02/15/24 1156  BP: 129/66 126/70  Pulse: 95 100  Resp: 18 18  Temp: 98 F (36.7 C) 98.1 F (36.7 C)  SpO2: 99% 99%    Physical Exam: Awake alert and oriented Nonlabored respirations Right temporal dressing in place clean dry intact  CBC    Component Value Date/Time   WBC 31.7 (H) 02/14/2024 0445   RBC 3.61 (L) 02/14/2024 0445   HGB 10.8 (L) 02/14/2024 0445   HGB 10.3 (L) 02/19/2018 1152   HCT 33.8 (L) 02/14/2024 0445   HCT 33.0 (L) 02/19/2018 1152   PLT 495 (H) 02/14/2024 0445   PLT 229 02/19/2018 1152   MCV 93.6 02/14/2024 0445   MCV 97 02/19/2018 1152   MCH 29.9 02/14/2024 0445   MCHC 32.0 02/14/2024 0445   RDW 14.9 02/14/2024 0445   RDW 13.5 02/19/2018 1152   LYMPHSABS 1.6 02/14/2024 0445   MONOABS 0.5 02/14/2024 0445   EOSABS 0.0 02/14/2024 0445   BASOSABS 0.1 02/14/2024 0445    BMET    Component Value Date/Time   NA 132 (L) 02/14/2024 0445   NA 140 02/19/2018 1152   K 4.8 02/14/2024 0445   CL 97 (L) 02/14/2024 0445   CO2 23 02/14/2024 0445   GLUCOSE 504 (HH) 02/14/2024 0445   BUN 44 (H) 02/14/2024 0445   BUN 30 (A) 07/31/2023 0000   CREATININE 1.72 (H) 02/14/2024 0445   CALCIUM  8.7 (L) 02/14/2024 0445   GFRNONAA 32 (L) 02/14/2024 0445   GFRAA 38 (L) 07/28/2019 1448    INR    Component Value Date/Time   INR 1.21 08/03/2017 1118     Intake/Output Summary (Last 24 hours) at 02/15/2024 1311 Last data filed at 02/15/2024 1011 Gross per 24 hour  Intake --  Output 2150 ml  Net -2150 ml     Assessment/plan:  73 y.o. female is s/p temporal artery biopsy negative for giant cell arteritis.  Dressing can be removed and she can follow-up with us  as needed for wound check.    Brae Gartman C. Sheree, MD [facility] Office: [phone] Pager: [phone]  02/15/2024 1:11 PM

## 2024-02-15 NOTE — Progress Notes (Signed)
 Removed PICC line without complications. Patient understood it that she is staying in the bed for 30 min. Educated patient how to take care of previous PICC site dressing. Informed patient's RN regarding this matter. HS [employer]

## 2024-02-15 NOTE — Progress Notes (Signed)
 Mobility Specialist Progress Note:    02/15/24 1133  Mobility  Activity Ambulated with assistance in room;Ambulated with assistance in hallway  Level of Assistance Standby assist, set-up cues, supervision of patient - no hands on  Assistive Device Front wheel walker  Distance Ambulated (ft) 300 ft  Activity Response Tolerated well  Mobility Referral Yes  Mobility visit 1 Mobility  Mobility Specialist Start Time (ACUTE ONLY) 1133  Mobility Specialist Stop Time (ACUTE ONLY) 1140  Mobility Specialist Time Calculation (min) (ACUTE ONLY) 7 min   Pt received in bed, agreeable to mobility session. SpO2 94-100% on RA throughout ambulation. Tolerated well, asx throughout. Eager for d/c. Returned pt to room, all needs met.    Kiara [name] Mobility Specialist Please contact via Special educational needs teacher or  Rehab office at [phone]

## 2024-02-15 NOTE — Progress Notes (Signed)
 Pt discharging home with family.  PICC line removed, and bedrest complete. All instructions given and reviewed, all questions answered. Volunteers to take Pt by TOC pharm on the way out.

## 2024-02-15 NOTE — Discharge Summary (Addendum)
 Physician Discharge Summary  Huma Imhoff [facility] FMW:[id number] DOB: 1951/04/29 DOA: 02/04/2024  PCP: Lawrance Handing, MD  Admit date: 02/04/2024 Discharge date: 02/15/2024 30 Day Unplanned Readmission Risk Score    Flowsheet Row ED to Hosp-Admission (Current) from 02/04/2024 in [facility] 4E CV SURGICAL PROGRESSIVE CARE  30 Day Unplanned Readmission Risk Score (%) 22.63 Filed at 02/15/2024 1200    This score is the patient's risk of an unplanned readmission within 30 days of being discharged (0 -100%). The score is based on dignosis, age, lab data, medications, orders, and past utilization.   Low:  0-14.9   Medium: 15-21.9   High: 22-29.9   Extreme: 30 and above          Admitted From: Home Disposition: Home  Recommendations for Outpatient Follow-up:  Follow up with PCP in 1-2 weeks Please obtain BMP/CBC in one week Follow-up with heart failure team, timing and date per them. Please follow up with your PCP on the following pending results: Unresulted Labs (From admission, onward)    None         Home Health: Yes Equipment/Devices: None  Discharge Condition: Stable CODE STATUS: Full code Diet recommendation: Cardiac/low-sodium  Subjective: Patient seen and examined.  No complaints at all.  she is in agreement with going home.  Brief/Interim Summary: Kiara [name] is a 73 y.o. female with medical history significant of HFpEF, CAD s/p CABG in 2014, CKD3, HTN, HLD, and DM2 p/w chest pain and found to have elevated troponin and BNP c/f ACS.    Pt states that she was in her usual state of health until 0800 when she awoke from her sleep w/ acute onset SOB. She endorsed intermittent CP lasting 5-101min over the past few days that worsens with activities like walking/talking and improves with rest. She reports following a strict low sodium diet and denies any recent weight gain or LE edema to accompany her SOB.    In the ED, pt tachycardic and tachypneic on 4L [place]. Labs notable for K 3.2, Cr  1.65, BNP 1264, troponin 2778-->2969, and WBC 15.9. CTA chest neg for PE, but did show b/l pleural effusions. Pt started on hep gtt for ACS, cards consulted per EDP, and pt admitted to medicine for ongoing w/u.Patient underwent heart cath 7/11. She was diuresed well with IV lasix .    NSTEMI - Appreciate cardiology, s/p heart cath 7/11 - no targets for PCI  - Continue aspirin , Plavix , lipitor  per cardiology recommendations.   Concern for temporal arteritis  - Admitted to right-sided headache that worsened overnight few nights ago, CRP and ESR as below.  - CRP 4.3 - ESR 121 - Started prednisone  02/12/2024, vascular surgery consulted, temporal biopsy completed 02/13/2024, finally results came back today and it ruled out temporal arteritis.  No further need of prednisone .   Acute hypoxic respiratory failure secondary to acute on chronic systolic and diastolic heart failure Ischemic cardiomyopathy  - EF 30% on the echo this admission. - Advanced heart failure team consulted, started milrinone  --> discontinued. - Cardiac MRI suggestive of ischemic cardiomyopathy  - Started on Entresto .  Patient required 2 L of oxygen at some point in time but now on room air.  Resumed diuretics.  Cleared by cardiology for discharge.   AKI on CKD stage IIIb - Baseline creatinine 1.7, creatinine peaked at 2.42 on 02/07/2024, improved now back to baseline.   Hyperlipidemia - Crestor  transition to Lipitor , Zetia  continued.   Diabetes mellitus type 2, uncontrolled with hyperglycemia - Hemoglobin A1c  10.6 in April 2025, patient was managed with Lantus , 3 times daily Premeal regimen as well as SSI here.  Had significant hyperglycemia due to being on high-dose of steroids which was managed with extra dose of IV NovoLog  along with increasing dose of Lantus .  However now that patient is going home without any steroids, she has been advised to resume her previous settings of the insulin  pump.   Leukocytosis - Infectious  work up has been negative.  Procalcitonin unremarkable.  Leukocytosis was stable between 12-16 until prednisone  was started and then jumped to 30.  No fever.  Suspect this is likely secondary to steroids.  Recommend repeating CBC in 4 to 7 days at PCPs office.   Chronic intermittent hyponatremia: 133 lately.  Asymptomatic.  Medication changes: Please note that per cardiology/heart failure team, No SGLT2i with type I diabetes, Hold off on spiro, K upper normal, Off beta blocker with low-output, continue aspirin , Plavix  also added.  Per them, do not restart Pletal  at discharge, contraindicated in CHF.  Aldactone  also stopped.  Patient's Lasix  was reduced from 40 mg p.o. twice daily to once daily per cardiology recommendation and gabapentin  was also reduced to 300 mg p.o. twice daily due to her renal dysfunction.  Discharge plan was discussed with patient and/or family member and they verbalized understanding and agreed with it.  Discharge Diagnoses:  Principal Problem:   NSTEMI (non-ST elevated myocardial infarction) (HCC) Active Problems:   Essential hypertension   Anxiety   HLD (hyperlipidemia)   Acute respiratory failure with hypoxia (HCC)   Acute on chronic combined systolic and diastolic CHF (congestive heart failure) (HCC)   ACS (acute coronary syndrome) (HCC)   Uncontrolled type 2 diabetes mellitus with hyperglycemia, with long-term current use of insulin  (HCC)   AKI (acute kidney injury) (HCC)   CKD stage 3b, GFR 30-44 ml/min [facility])    Discharge Instructions  Discharge Instructions     Amb Referral to Cardiac Rehabilitation   Complete by: As directed    To [place]   Diagnosis: NSTEMI   After initial evaluation and assessments completed: Virtual Based Care may be provided alone or in conjunction with Phase 2 Cardiac Rehab based on patient barriers.: Yes   Intensive Cardiac Rehabilitation (ICR) [facility] location only OR Traditional Cardiac Rehabilitation (TCR) *If criteria for ICR  are not met will enroll in TCR ([facility] only): Yes      Allergies as of 02/15/2024       Reactions   Bactrim [sulfamethoxazole-trimethoprim] Rash   Codeine Nausea And Vomiting   Hydromorphone  Nausea Only, Other (See Comments)   Diaphoretic, blood pressure issues, coughing,   Silvadene [silver Sulfadiazine] Other (See Comments)   Red hot rash and blisters   Adhesive [tape] Rash   Band aid brand    Mobic [meloxicam] Swelling   Morphine  And Codeine Nausea Only, Other (See Comments)   Confusion         Medication List     STOP taking these medications    cilostazol  100 MG tablet Commonly known as: PLETAL    gabapentin  800 MG tablet Commonly known as: NEURONTIN  Replaced by: gabapentin  300 MG capsule   metoprolol  succinate 25 MG 24 hr tablet Commonly known as: TOPROL -XL   rosuvastatin  40 MG tablet Commonly known as: CRESTOR        TAKE these medications    acetaminophen  650 MG CR tablet Commonly known as: TYLENOL  Take 1,300 mg by mouth every 8 (eight) hours as needed for pain.   albuterol  108 (90  Base) MCG/ACT inhaler Commonly known as: VENTOLIN  HFA Inhale 2 puffs into the lungs every 4 (four) hours as needed for shortness of breath or wheezing.   aspirin  EC 81 MG tablet Take 81 mg by mouth daily.   atorvastatin  80 MG tablet Commonly known as: LIPITOR  Take 1 tablet (80 mg total) by mouth daily. Start taking on: February 16, 2024   Cinnamon 500 MG capsule Take 500 mg by mouth daily.   clopidogrel  75 MG tablet Commonly known as: PLAVIX  Take 1 tablet (75 mg total) by mouth daily. Start taking on: February 16, 2024   CoQ10 100 MG Caps Take 100 mg by mouth daily.   DULoxetine  60 MG capsule Commonly known as: CYMBALTA  Take 60 mg by mouth daily.   ezetimibe  10 MG tablet Commonly known as: ZETIA  Take 1 tablet (10 mg total) by mouth daily.   fluticasone  50 MCG/ACT nasal spray Commonly known as: FLONASE  Place 2 sprays into both nostrils at bedtime as needed for  allergies or rhinitis.   furosemide  40 MG tablet Commonly known as: LASIX  Take 1 tablet (40 mg total) by mouth daily.   gabapentin  300 MG capsule Commonly known as: Neurontin  Take 1 capsule (300 mg total) by mouth 2 (two) times daily. Replaces: gabapentin  800 MG tablet   hydrocortisone  cream 1 % Apply 1 Application topically daily as needed for itching.   insulin  aspart 100 UNIT/ML injection Commonly known as: novoLOG  Inject 1.75 Units into the skin See admin instructions. 1.25 Units 12 Am-7 Am,  1.75 u/hr 7 AM  Til 12 AM, Bolus 1 unit for every 15 carbs, 1 Unit for every 50 points greater than 150 Insulin  pump   levocetirizine 5 MG tablet Commonly known as: XYZAL  Take 5 mg by mouth daily.   montelukast 10 MG tablet Commonly known as: SINGULAIR Take 10 mg by mouth daily.   ofloxacin  0.3 % ophthalmic solution Commonly known as: OCUFLOX  Place 2 drops into the left eye 3 (three) times daily.   omeprazole 40 MG capsule Commonly known as: PRILOSEC Take 40 mg by mouth daily.   sacubitril -valsartan  24-26 MG Commonly known as: ENTRESTO  Take 1 tablet by mouth 2 (two) times daily.   Turmeric 500 MG Tabs Take 1 tablet by mouth daily.   Vitamin D  50 MCG (2000 UT) tablet Take 2,000 Units by mouth daily.               Durable Medical Equipment  (From admission, onward)           Start     Ordered   02/14/24 1322  For home use only DME Walker rolling  Once       Question Answer Comment  Walker: With 5 Inch Wheels   Patient needs a walker to treat with the following condition Physical deconditioning   Patient needs a walker to treat with the following condition Heart failure (HCC)      02/14/24 1322            Follow-up Information     [facility] Follow up on 02/22/2024.   Specialty: Cardiology Why: Advanced Heart Failure Clinic 9:30 AM Entrance C, Free Valet Parking Contact information: [address] [place]  [phone]        [facility] at [facility] Follow up.   Specialty: Vascular Surgery Why: As needed Contact information: [address]  [address]  [address] [phone]        Lawrance Handing, MD Follow up in 1 week(s).   Specialty: Internal Medicine Contact information: [address] [place] [address] [phone]                Allergies  Allergen Reactions   Bactrim [Sulfamethoxazole-Trimethoprim] Rash   Codeine Nausea And Vomiting   Hydromorphone  Nausea Only and Other (See Comments)    Diaphoretic, blood pressure issues, coughing,   Silvadene [Silver Sulfadiazine] Other (See Comments)    Red hot rash and blisters   Adhesive [Tape] Rash    Band aid brand     Mobic [Meloxicam] Swelling   Morphine  And Codeine Nausea Only and Other (See Comments)    Confusion     Consultations: Cardiology and vascular surgery   Procedures/Studies: MR CARDIAC MORPHOLOGY W WO CONTRAST Result Date: 02/11/2024 CLINICAL DATA:  Cardiomyopathy, undefined, further testing COMPARISON: Echo 02/05/24 EXAM: MR CARDIA MORPHOLOGY WITHOUT AND WITH CONTRAST; MR CARDIAC VELOCITY FLOW MAPPING TECHNIQUE: The patient was scanned on a 1.5 Tesla Siemens magnet. A dedicated cardiac coil was used. Functional imaging was done using TrueFisp sequences. 2,3, and 4 chamber views were done to assess for RWMA's. Modified Simpson's rule using a short axis stack was used to calculate an ejection fraction on a dedicated work [employer]. The patient received 10mL GADAVIST  GADOBUTROL  1 MMOL/ML IV SOLN. After 10 minutes inversion recovery sequences were used to assess for infiltration and scar tissue. Phase contrast velocity encoded images obtained x 2. This examination is tailored for evaluation cardiac anatomy and function and provides very limited assessment of noncardiac  structures, which are accordingly not evaluated during interpretation. If there is clinical concern for extracardiac pathology, further evaluation with CT imaging should be considered. FINDINGS: LEFT VENTRICLE: Left ventricular chamber size: Normal by indexed volume. Left ventricular wall thickness: Mildly increased. Maximal wall thickness 13 mm. Left ventricular systolic function: Moderately reduced LVEF = 34% There are regional wall motion abnormalities: Mid to apical anterior akinesis. No myocardial edema, T2 = 52 msec Abnormal first pass perfusion, anterior and lateral wall infarct. There is post contrast delayed myocardial enhancement: Mid to apical anterior wall subendocardial LGE, <50% myocardial thickness suggesting potential viability. Subendocardial basal-apical lateral wall LGE, <50% myocardial thickness suggesting potential viability. Subendocardial mid ventricular inferoseptal LGE that extends to the apex and is >50% myocardial thickness suggesting no viability, but is focal at the inferoseptum/inferior RV insertion point. Subendocardial apical inferior wall, <50% myocardial thickness, suggesting potential viability. Normal T1 myocardial nulling kinetics suggest against a diagnosis of cardiac amyloidosis. ECV = 30%, nonspecific elevation. RIGHT VENTRICLE: Normal right ventricular chamber size. Normal right ventricular wall thickness. Mildly reduced right ventricular systolic function. RVEF = 48% Mild global hypokinesis. No post contrast delayed myocardial enhancement. ATRIA: Grossly normal. PERICARDIUM: Normal pericardium.  Trivial pericardial effusion. OTHER: Small bilateral pleural effusions. MEASUREMENTS: Qp/Qs: 0.7 VALVES: Tricuspid aortic valve. Aortic valve regurgitation: Trivial, regurgitant fraction 4% Pulmonary valve regurgitation: Moderate, regurgitant fraction 20% Mitral valve regurgitation: Mild, regurgitant fraction 10% Tricuspid valve regurgitation: Mild, regurgitant fraction 17% Left  ventricle: LV female LV EF: 34 % (Normal 52-79%) Absolute volumes: LV EDV: (Normal 78-167 mL) LV ESV: (Normal 21-64 mL) LV SV: 58mL (Normal 52-114 mL) CO: 4.9L/min (Normal 2.7-6.3 L/min) Indexed volumes: CI: 2.51L/min/sq-m (Normal 1.9-3.9 L/min/sq-m) LV EDV: 68mL/sq-m (Normal 50-96 mL/sq-m) LV ESV: 33mL/sq-m (Normal 10-40 mL/sq-m) LV SV: 20mL/sq-m (Normal 33-64 mL/sq-m) Right ventricle: RV female RV EF: 48% (normal  52-80%) Absolute volumes: RV EDV: (Normal 79-175 mL) RV ESV: 55mL (Normal 13-75 mL) RV SV: 51mL (Normal 56-110 mL) CO: 4.3L/min (Normal 2.7-6 L/min) Indexed volumes: CI: 2.2L/min/sq-m (Normal 1.8-3.8 L/min/sq-m) RV EDV: 81mL/sq-m (Normal 51-97 mL/sq-m) RV ESV: 3mL/sq-m (Normal 9-42 mL/sq-m) RV SV: 68mL/sq-m (Normal 35-61 mL/sq-m) IMPRESSION: 1. Moderately reduced LV systolic function with grossly normal LV chamber size. LVEF 34%. There is subendocardial scar in the basal to apical lateral, mid to apical anterior, and apical inferior walls with potential viability, with focal transmural likely nonviable myocardium at the mid-apical inferoseptum. Findings in combination suggest ischemic cardiomyopathy. 2. Mildly reduced RV systolic function with normal RV chamber size, RVEF 48%. No delayed myocardial enhancement. 3. Moderate pulmonary valve regurgitation, mild mitral and tricuspid valve regurgitation. Electronically Signed   By: Soyla Merck M.D.   On: 02/11/2024 10:07   MR CARDIAC VELOCITY FLOW MAP Result Date: 02/11/2024 CLINICAL DATA:  Cardiomyopathy, undefined, further testing COMPARISON: Echo 02/05/24 EXAM: MR CARDIA MORPHOLOGY WITHOUT AND WITH CONTRAST; MR CARDIAC VELOCITY FLOW MAPPING TECHNIQUE: The patient was scanned on a 1.5 Tesla Siemens magnet. A dedicated cardiac coil was used. Functional imaging was done using TrueFisp sequences. 2,3, and 4 chamber views were done to assess for RWMA's. Modified Simpson's rule using a short axis stack was used to calculate an ejection  fraction on a dedicated work [employer], [employer]. The patient received 10mL GADAVIST  GADOBUTROL  1 MMOL/ML IV SOLN. After 10 minutes inversion recovery sequences were used to assess for infiltration and scar tissue. Phase contrast velocity encoded images obtained x 2. This examination is tailored for evaluation cardiac anatomy and function and provides very limited assessment of noncardiac structures, which are accordingly not evaluated during interpretation. If there is clinical concern for extracardiac pathology, further evaluation with CT imaging should be considered. FINDINGS: LEFT VENTRICLE: Left ventricular chamber size: Normal by indexed volume. Left ventricular wall thickness: Mildly increased. Maximal wall thickness 13 mm. Left ventricular systolic function: Moderately reduced LVEF = 34% There are regional wall motion abnormalities: Mid to apical anterior akinesis. No myocardial edema, T2 = 52 msec Abnormal first pass perfusion, anterior and lateral wall infarct. There is post contrast delayed myocardial enhancement: Mid to apical anterior wall subendocardial LGE, <50% myocardial thickness suggesting potential viability. Subendocardial basal-apical lateral wall LGE, <50% myocardial thickness suggesting potential viability. Subendocardial mid ventricular inferoseptal LGE that extends to the apex and is >50% myocardial thickness suggesting no viability, but is focal at the inferoseptum/inferior RV insertion point. Subendocardial apical inferior wall, <50% myocardial thickness, suggesting potential viability. Normal T1 myocardial nulling kinetics suggest against a diagnosis of cardiac amyloidosis. ECV = 30%, nonspecific elevation. RIGHT VENTRICLE: Normal right ventricular chamber size. Normal right ventricular wall thickness. Mildly reduced right ventricular systolic function. RVEF = 48% Mild global hypokinesis. No post contrast delayed myocardial enhancement. ATRIA: Grossly normal. PERICARDIUM:  Normal pericardium.  Trivial pericardial effusion. OTHER: Small bilateral pleural effusions. MEASUREMENTS: Qp/Qs: 0.7 VALVES: Tricuspid aortic valve. Aortic valve regurgitation: Trivial, regurgitant fraction 4% Pulmonary valve regurgitation: Moderate, regurgitant fraction 20% Mitral valve regurgitation: Mild, regurgitant fraction 10% Tricuspid valve regurgitation: Mild, regurgitant fraction 17% Left ventricle: LV female LV EF: 34 % (Normal 52-79%) Absolute volumes: LV EDV: (Normal 78-167 mL) LV ESV: (Normal 21-64 mL) LV SV: 58mL (Normal 52-114 mL) CO: 4.9L/min (Normal 2.7-6.3 L/min) Indexed volumes: CI: 2.51L/min/sq-m (Normal 1.9-3.9 L/min/sq-m) LV EDV: 94mL/sq-m (Normal 50-96 mL/sq-m) LV ESV: 60mL/sq-m (Normal 10-40 mL/sq-m) LV SV: 45mL/sq-m (Normal 33-64 mL/sq-m) Right ventricle: RV female RV  EF: 48% (normal 52-80%) Absolute volumes: RV EDV: (Normal 79-175 mL) RV ESV: 55mL (Normal 13-75 mL) RV SV: 51mL (Normal 56-110 mL) CO: 4.3L/min (Normal 2.7-6 L/min) Indexed volumes: CI: 2.2L/min/sq-m (Normal 1.8-3.8 L/min/sq-m) RV EDV: 45mL/sq-m (Normal 51-97 mL/sq-m) RV ESV: 19mL/sq-m (Normal 9-42 mL/sq-m) RV SV: 42mL/sq-m (Normal 35-61 mL/sq-m) IMPRESSION: 1. Moderately reduced LV systolic function with grossly normal LV chamber size. LVEF 34%. There is subendocardial scar in the basal to apical lateral, mid to apical anterior, and apical inferior walls with potential viability, with focal transmural likely nonviable myocardium at the mid-apical inferoseptum. Findings in combination suggest ischemic cardiomyopathy. 2. Mildly reduced RV systolic function with normal RV chamber size, RVEF 48%. No delayed myocardial enhancement. 3. Moderate pulmonary valve regurgitation, mild mitral and tricuspid valve regurgitation. Electronically Signed   By: Soyla Merck M.D.   On: 02/11/2024 10:07   MR CARDIAC VELOCITY FLOW MAP Result Date: 02/11/2024 CLINICAL DATA:  Cardiomyopathy, undefined, further testing  COMPARISON: Echo 02/05/24 EXAM: MR CARDIA MORPHOLOGY WITHOUT AND WITH CONTRAST; MR CARDIAC VELOCITY FLOW MAPPING TECHNIQUE: The patient was scanned on a 1.5 Tesla Siemens magnet. A dedicated cardiac coil was used. Functional imaging was done using TrueFisp sequences. 2,3, and 4 chamber views were done to assess for RWMA's. Modified Simpson's rule using a short axis stack was used to calculate an ejection fraction on a dedicated work [employer], [employer]. The patient received 10mL GADAVIST  GADOBUTROL  1 MMOL/ML IV SOLN. After 10 minutes inversion recovery sequences were used to assess for infiltration and scar tissue. Phase contrast velocity encoded images obtained x 2. This examination is tailored for evaluation cardiac anatomy and function and provides very limited assessment of noncardiac structures, which are accordingly not evaluated during interpretation. If there is clinical concern for extracardiac pathology, further evaluation with CT imaging should be considered. FINDINGS: LEFT VENTRICLE: Left ventricular chamber size: Normal by indexed volume. Left ventricular wall thickness: Mildly increased. Maximal wall thickness 13 mm. Left ventricular systolic function: Moderately reduced LVEF = 34% There are regional wall motion abnormalities: Mid to apical anterior akinesis. No myocardial edema, T2 = 52 msec Abnormal first pass perfusion, anterior and lateral wall infarct. There is post contrast delayed myocardial enhancement: Mid to apical anterior wall subendocardial LGE, <50% myocardial thickness suggesting potential viability. Subendocardial basal-apical lateral wall LGE, <50% myocardial thickness suggesting potential viability. Subendocardial mid ventricular inferoseptal LGE that extends to the apex and is >50% myocardial thickness suggesting no viability, but is focal at the inferoseptum/inferior RV insertion point. Subendocardial apical inferior wall, <50% myocardial thickness, suggesting potential  viability. Normal T1 myocardial nulling kinetics suggest against a diagnosis of cardiac amyloidosis. ECV = 30%, nonspecific elevation. RIGHT VENTRICLE: Normal right ventricular chamber size. Normal right ventricular wall thickness. Mildly reduced right ventricular systolic function. RVEF = 48% Mild global hypokinesis. No post contrast delayed myocardial enhancement. ATRIA: Grossly normal. PERICARDIUM: Normal pericardium.  Trivial pericardial effusion. OTHER: Small bilateral pleural effusions. MEASUREMENTS: Qp/Qs: 0.7 VALVES: Tricuspid aortic valve. Aortic valve regurgitation: Trivial, regurgitant fraction 4% Pulmonary valve regurgitation: Moderate, regurgitant fraction 20% Mitral valve regurgitation: Mild, regurgitant fraction 10% Tricuspid valve regurgitation: Mild, regurgitant fraction 17% Left ventricle: LV female LV EF: 34 % (Normal 52-79%) Absolute volumes: LV EDV: (Normal 78-167 mL) LV ESV: (Normal 21-64 mL) LV SV: 58mL (Normal 52-114 mL) CO: 4.9L/min (Normal 2.7-6.3 L/min) Indexed volumes: CI: 2.51L/min/sq-m (Normal 1.9-3.9 L/min/sq-m) LV EDV: 46mL/sq-m (Normal 50-96 mL/sq-m) LV ESV: 20mL/sq-m (Normal 10-40 mL/sq-m) LV SV: 87mL/sq-m (Normal 33-64 mL/sq-m) Right ventricle:  RV female RV EF: 48% (normal 52-80%) Absolute volumes: RV EDV: (Normal 79-175 mL) RV ESV: 55mL (Normal 13-75 mL) RV SV: 51mL (Normal 56-110 mL) CO: 4.3L/min (Normal 2.7-6 L/min) Indexed volumes: CI: 2.2L/min/sq-m (Normal 1.8-3.8 L/min/sq-m) RV EDV: 12mL/sq-m (Normal 51-97 mL/sq-m) RV ESV: 48mL/sq-m (Normal 9-42 mL/sq-m) RV SV: 83mL/sq-m (Normal 35-61 mL/sq-m) IMPRESSION: 1. Moderately reduced LV systolic function with grossly normal LV chamber size. LVEF 34%. There is subendocardial scar in the basal to apical lateral, mid to apical anterior, and apical inferior walls with potential viability, with focal transmural likely nonviable myocardium at the mid-apical inferoseptum. Findings in combination suggest ischemic  cardiomyopathy. 2. Mildly reduced RV systolic function with normal RV chamber size, RVEF 48%. No delayed myocardial enhancement. 3. Moderate pulmonary valve regurgitation, mild mitral and tricuspid valve regurgitation. Electronically Signed   By: Soyla Merck M.D.   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 Known prox occluded         Bypassed by patent SVG-RCA. 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 Known prox occluded         Bypassed by patent SVG-RCA. 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  ECHOCARDIOGRAM COMPLETE Result Date: 02/05/2024    ECHOCARDIOGRAM REPORT   Patient Name:   Kiara [name] Date of Exam: 02/05/2024 Medical Rec #:  [id number]        Height:       63.0 in Accession #:    [id number]       Weight:       200.0 lb Date of Birth:  February 06, 1951        BSA:          1.934 m Patient Age:    72 years         BP:           119/62 mmHg Patient Gender: F  HR:           87 bpm. Exam Location:  Inpatient Procedure: 2D Echo, Cardiac Doppler, Color Doppler and Intracardiac            Opacification Agent (Both Spectral and Color Flow Doppler were            utilized during procedure). Indications:    Elevated Troponin  History:        Patient has prior history of Echocardiogram examinations. CAD;                 Risk Factors:Hypertension.  Sonographer:    Vella Key Referring Phys: [id number] TAYLOR A PARCELLS IMPRESSIONS  1. Left ventricular ejection fraction, by estimation, is 30%. The left ventricle has severely decreased function. The left ventricle demonstrates global hypokinesis. There is mild left ventricular hypertrophy. Left ventricular diastolic parameters are indeterminate.  2. Right ventricular systolic function was not well visualized. The right ventricular size is normal.  3. The mitral valve is grossly normal. Mild mitral valve regurgitation. The mean mitral valve gradient is 4.0 mmHg.  4. The aortic valve is tricuspid. Aortic valve regurgitation is not visualized. No aortic stenosis is present.  5. The inferior vena cava is normal in size with <50% respiratory  variability, suggesting right atrial pressure of 8 mmHg. Comparison(s): Prior images reviewed side by side. Function has decreased from 2020; this study uses contrast. FINDINGS  Left Ventricle: Calcified chords. Left ventricular ejection fraction, by estimation, is 30%. The left ventricle has severely decreased function. The left ventricle demonstrates global hypokinesis. The left ventricular internal cavity size was normal in size. There is mild left ventricular hypertrophy. Left ventricular diastolic parameters are indeterminate. Right Ventricle: The right ventricular size is normal. No increase in right ventricular wall thickness. Right ventricular systolic function was not well visualized. Left Atrium: Left atrial size was normal in size. Right Atrium: Right atrial size was normal in size. Pericardium: There is no evidence of pericardial effusion. Mitral Valve: The mitral valve is grossly normal. Mild mitral valve regurgitation. MV peak gradient, 7.9 mmHg. The mean mitral valve gradient is 4.0 mmHg. Tricuspid Valve: The tricuspid valve is normal in structure. Tricuspid valve regurgitation is mild . No evidence of tricuspid stenosis. Aortic Valve: The aortic valve is tricuspid. Aortic valve regurgitation is not visualized. No aortic stenosis is present. Pulmonic Valve: The pulmonic valve was grossly normal. Pulmonic valve regurgitation is mild to moderate. Aorta: The aortic root and ascending aorta are structurally normal, with no evidence of dilitation. Venous: The inferior vena cava is normal in size with less than 50% respiratory variability, suggesting right atrial pressure of 8 mmHg. IAS/Shunts: The atrial septum is grossly normal.  LEFT VENTRICLE PLAX 2D LVIDd:         4.10 cm      Diastology LVIDs:         2.80 cm      LV e' medial:    4.68 cm/s LV PW:         1.20 cm      LV E/e' medial:  28.4 LV IVS:        1.10 cm      LV e' lateral:   5.33 cm/s LVOT diam:     1.50 cm      LV E/e' lateral: 25.0 LV SV:          28 LV SV Index:   15 LVOT Area:     1.77 cm  LV Volumes (MOD)  LV vol d, MOD A2C: 102.0 ml LV vol d, MOD A4C: 100.0 ml LV vol s, MOD A2C: 75.1 ml LV vol s, MOD A4C: 72.5 ml LV SV MOD A2C:     26.9 ml LV SV MOD A4C:     100.0 ml LV SV MOD BP:      29.6 ml RIGHT VENTRICLE RV Basal diam:  3.40 cm RV S prime:     3.92 cm/s TAPSE (M-mode): 0.8 cm LEFT ATRIUM             Index        RIGHT ATRIUM           Index LA diam:        4.30 cm 2.22 cm/m   RA Area:     13.70 cm LA Vol (A2C):   38.1 ml 19.70 ml/m  RA Volume:   31.10 ml  16.08 ml/m LA Vol (A4C):   39.7 ml 20.53 ml/m LA Biplane Vol: 40.4 ml 20.89 ml/m  AORTIC VALVE LVOT Vmax:   72.40 cm/s LVOT Vmean:  53.000 cm/s LVOT VTI:    0.161 m  AORTA Ao Root diam: 3.00 cm Ao Asc diam:  2.90 cm MITRAL VALVE                TRICUSPID VALVE MV Area (PHT): 3.89 cm     TV Peak grad:   27.9 mmHg MV Area VTI:   0.93 cm     TV Vmax:        2.64 m/s MV Peak grad:  7.9 mmHg MV Mean grad:  4.0 mmHg     SHUNTS MV Vmax:       1.40 m/s     Systemic VTI:  0.16 m MV Vmean:      94.8 cm/s    Systemic Diam: 1.50 cm MV Decel Time: 195 msec MV E velocity: 133.00 cm/s MV A velocity: 103.00 cm/s MV E/A ratio:  1.29 Stanly Leavens MD Electronically signed by Stanly Leavens MD Signature Date/Time: 02/05/2024/4:43:25 PM    Final    CT Angio Chest Pulmonary Embolism (PE) W or WO Contrast Result Date: 02/04/2024 CLINICAL DATA:  Acute aortic syndrome suspected. Shortness of breath and back pain. Intermittent chest pain. EXAM: CT ANGIOGRAPHY CHEST WITH CONTRAST TECHNIQUE: Multidetector CT imaging of the chest was performed using the standard protocol during bolus administration of intravenous contrast. Multiplanar CT image reconstructions and MIPs were obtained to evaluate the vascular anatomy. RADIATION DOSE REDUCTION: This exam was performed according to the departmental dose-optimization program which includes automated exposure control, adjustment of the mA and/or kV according  to patient size and/or use of iterative reconstruction technique. CONTRAST:  75mL OMNIPAQUE  IOHEXOL  350 MG/ML SOLN COMPARISON:  Chest radiograph 02/04/2024 FINDINGS: Cardiovascular: Unenhanced images of the chest demonstrate calcification of the aorta. No acute intramural hematoma. Postoperative changes with sternotomy wires and surgical clips in the mediastinum consistent with coronary bypass. Images obtained after contrast material administration demonstrate normal caliber thoracic aorta. No aortic dissection. Great vessel origins are patent. Mild cardiac enlargement. No pericardial effusions. Central pulmonary arteries are well opacified. No evidence of significant pulmonary embolus. Mediastinum/Nodes: Esophagus is decompressed. Thyroid  gland is unremarkable. Prominent mediastinal lymph nodes with largest left aortopulmonic window nodes measuring up to 1.5 cm in diameter. Appearance is nonspecific. Lungs/Pleura: Small bilateral pleural effusions. Atelectasis or consolidation in the lung bases. Diffuse thickening of interlobular septa likely representing edema. Upper Abdomen: No acute abnormalities demonstrated. Musculoskeletal: Sternotomy wires. Degenerative changes in the  spine. No acute bony abnormalities. Review of the MIP images confirms the above findings. IMPRESSION: 1. Aortic atherosclerosis. No evidence of aortic aneurysm or dissection. 2. No evidence of significant pulmonary embolus. 3. Nonspecific lymphadenopathy in the mediastinum. 4. Bilateral pleural effusions with basilar atelectasis or consolidation. 5. Diffuse interstitial thickening suggesting pulmonary edema. Electronically Signed   By: Elsie Gravely M.D.   On: 02/04/2024 23:59   DG Chest 2 View Result Date: 02/04/2024 CLINICAL DATA:  Shortness of breath EXAM: CHEST - 2 VIEW COMPARISON:  Chest radiograph August 04, 2017 FINDINGS: The heart size and mediastinal contours are within it borderline normal limits. Blunting of bilateral  costophrenic angles left greater than right suggestive of mild pleural effusion/atelectasis, improved to prior exam from 2019. No suspicious new consolidation or pulmonary nodule. Status post median sternotomy. The visualized skeletal structures are unremarkable. IMPRESSION: Suggestion of bilateral small pleural effusions. Otherwise no active cardiopulmonary disease. Status post CABG. Electronically Signed   By: Megan  Zare M.D.   On: 02/04/2024 20:01     Discharge Exam: Vitals:   02/15/24 0731 02/15/24 1156  BP: 129/66 126/70  Pulse: 95 100  Resp: 18 18  Temp: 98 F (36.7 C) 98.1 F (36.7 C)  SpO2: 99% 99%   Vitals:   02/14/24 2324 02/15/24 0301 02/15/24 0731 02/15/24 1156  BP: (!) 135/57 117/70 129/66 126/70  Pulse: 87 80 95 100  Resp: 17 16 18 18   Temp: 97.6 F (36.4 C)  98 F (36.7 C) 98.1 F (36.7 C)  TempSrc: Oral  Oral Oral  SpO2: 99%  99% 99%  Weight:  86.7 kg    Height:        General: Pt is alert, awake, not in acute distress Cardiovascular: RRR, S1/S2 +, no rubs, no gallops Respiratory: CTA bilaterally, no wheezing, no rhonchi Abdominal: Soft, NT, ND, bowel sounds + Extremities: no edema, no cyanosis    The results of significant diagnostics from this hospitalization (including imaging, microbiology, ancillary and laboratory) are listed below for reference.     Microbiology: Recent Results (from the past 240 hours)  Culture, blood (Routine X 2) w Reflex to ID Panel     Status: None   Collection Time: 02/10/24  2:59 PM   Specimen: BLOOD LEFT HAND  Result Value Ref Range Status   Specimen Description BLOOD LEFT HAND  Final   Special Requests   Final    BOTTLES DRAWN AEROBIC ONLY Blood Culture results may not be optimal due to an inadequate volume of blood received in culture bottles   Culture   Final    NO GROWTH 5 DAYS Performed at [facility], [address] [place], [place] [address]    Report Status 02/15/2024 FINAL  Final  Culture, blood  (Routine X 2) w Reflex to ID Panel     Status: None   Collection Time: 02/10/24  2:59 PM   Specimen: BLOOD LEFT HAND  Result Value Ref Range Status   Specimen Description BLOOD LEFT HAND  Final   Special Requests   Final    BOTTLES DRAWN AEROBIC ONLY Blood Culture results may not be optimal due to an inadequate volume of blood received in culture bottles   Culture   Final    NO GROWTH 5 DAYS Performed at [facility], [address], [place], [place] [address]    Report Status 02/15/2024 FINAL  Final     Labs: BNP (last 3 results) Recent Labs  02/04/24 1959  BNP 1,264.0*   Basic Metabolic Panel: Recent Labs  Lab 02/09/24 0607 02/10/24 0400 02/11/24 0515 02/12/24 0405 02/13/24 0500 02/14/24 0445  NA 135 135 134* 136 133* 132*  K 3.3* 3.3* 4.2 4.3 4.9 4.8  CL 93* 94* 96* 98 95* 97*  CO2 28 31 28 26  21* 23  GLUCOSE 310* 192* 185* 240* 509* 504*  BUN 44* 35* 28* 25* 33* 44*  CREATININE 1.77* 1.77* 1.58* 1.42* 1.56* 1.72*  CALCIUM  8.3* 8.4* 8.6* 8.9 8.7* 8.7*  MG 2.5* 2.1 2.2 2.4 2.5*  --    Liver Function Tests: No results for input(s): AST, ALT, ALKPHOS, BILITOT, PROT, ALBUMIN  in the last 168 hours. No results for input(s): LIPASE, AMYLASE in the last 168 hours. No results for input(s): AMMONIA in the last 168 hours. CBC: Recent Labs  Lab 02/10/24 0400 02/11/24 0515 02/12/24 0405 02/13/24 0500 02/14/24 0445  WBC 14.2* 16.2* 16.1* 24.1* 31.7*  NEUTROABS  --   --   --   --  29.1*  HGB 9.6* 10.0* 10.2* 11.3* 10.8*  HCT 29.6* 30.9* 32.1* 35.6* 33.8*  MCV 91.6 92.2 93.3 94.2 93.6  PLT 341 349 376 437* 495*   Cardiac Enzymes: No results for input(s): CKTOTAL, CKMB, CKMBINDEX, TROPONINI in the last 168 hours. BNP: Invalid input(s): POCBNP CBG: Recent Labs  Lab 02/15/24 0427 02/15/24 0529 02/15/24 0612 02/15/24 0734 02/15/24 1151  GLUCAP 73 66* 175* 188* 175*   D-Dimer No results for input(s): DDIMER in the last 72  hours. Hgb A1c No results for input(s): HGBA1C in the last 72 hours. Lipid Profile No results for input(s): CHOL, HDL, LDLCALC, TRIG, CHOLHDL, LDLDIRECT in the last 72 hours. Thyroid  function studies No results for input(s): TSH, T4TOTAL, T3FREE, THYROIDAB in the last 72 hours.  Invalid input(s): FREET3 Anemia work up No results for input(s): VITAMINB12, FOLATE, FERRITIN, TIBC, IRON , RETICCTPCT in the last 72 hours. Urinalysis    Component Value Date/Time   COLORURINE YELLOW 02/06/2024 0552   APPEARANCEUR CLOUDY (A) 02/06/2024 0552   LABSPEC 1.022 02/06/2024 0552   PHURINE 5.0 02/06/2024 0552   GLUCOSEU >=500 (A) 02/06/2024 0552   HGBUR NEGATIVE 02/06/2024 0552   BILIRUBINUR NEGATIVE 02/06/2024 0552   KETONESUR NEGATIVE 02/06/2024 0552   PROTEINUR 100 (A) 02/06/2024 0552   UROBILINOGEN 0.2 04/09/2013 1516   NITRITE NEGATIVE 02/06/2024 0552   LEUKOCYTESUR NEGATIVE 02/06/2024 0552   Sepsis Labs Recent Labs  Lab 02/11/24 0515 02/12/24 0405 02/13/24 0500 02/14/24 0445  WBC 16.2* 16.1* 24.1* 31.7*   Microbiology Recent Results (from the past 240 hours)  Culture, blood (Routine X 2) w Reflex to ID Panel     Status: None   Collection Time: 02/10/24  2:59 PM   Specimen: BLOOD LEFT HAND  Result Value Ref Range Status   Specimen Description BLOOD LEFT HAND  Final   Special Requests   Final    BOTTLES DRAWN AEROBIC ONLY Blood Culture results may not be optimal due to an inadequate volume of blood received in culture bottles   Culture   Final    NO GROWTH 5 DAYS Performed at [facility], [address] [place], [place] [address]    Report Status 02/15/2024 FINAL  Final  Culture, blood (Routine X 2) w Reflex to ID Panel     Status: None   Collection Time: 02/10/24  2:59 PM   Specimen: BLOOD LEFT HAND  Result Value Ref Range Status   Specimen Description BLOOD LEFT  HAND  Final   Special Requests   Final    BOTTLES DRAWN AEROBIC  ONLY Blood Culture results may not be optimal due to an inadequate volume of blood received in culture bottles   Culture   Final    NO GROWTH 5 DAYS Performed at [facility], [address], [place], [place] [address]    Report Status 02/15/2024 FINAL  Final    FURTHER DISCHARGE INSTRUCTIONS:   Get Medicines reviewed and adjusted: Please take all your medications with you for your next visit with your Primary MD   Laboratory/radiological data: Please request your Primary MD to go over all hospital tests and procedure/radiological results at the follow up, please ask your Primary MD to get all Hospital records sent to his/her office.   In some cases, they will be blood work, cultures and biopsy results pending at the time of your discharge. Please request that your primary care M.D. goes through all the records of your hospital data and follows up on these results.   Also Note the following: If you experience worsening of your admission symptoms, develop shortness of breath, life threatening emergency, suicidal or homicidal thoughts you must seek medical attention immediately by calling 911 or calling your MD immediately  if symptoms less severe.   You must read complete instructions/literature along with all the possible adverse reactions/side effects for all the Medicines you take and that have been prescribed to you. Take any new Medicines after you have completely understood and accpet all the possible adverse reactions/side effects.    patient was instructed, not to drive, operate heavy machinery, perform activities at heights, swimming or participation in water activities or provide baby-sitting services while on Pain, Sleep and Anxiety Medications; until their outpatient Physician has advised to do so again. Also recommended to not to take more than prescribed Pain, Sleep and Anxiety Medications.  It is not advisable to combine anxiety, sleep and pain medications without talking  with your primary care provider.     Wear Seat belts while driving.   Please note: You were cared for by a hospitalist during your hospital stay. Once you are discharged, your primary care physician will handle any further medical issues. Please note that NO REFILLS for any discharge medications will be authorized once you are discharged, as it is imperative that you return to your primary care physician (or establish a relationship with a primary care physician if you do not have one) for your post hospital discharge needs so that they can reassess your need for medications and monitor your lab values  Time coordinating discharge: Over 30 minutes  SIGNED:   Fredia Skeeter, MD  [facility] 02/15/2024, 1:53 PM *Please note that this is a verbal dictation therefore any spelling or grammatical errors are due to the Dragon Medical One system interpretation. If 7PM-7AM, please contact night-coverage [email or web address]

## 2024-02-15 NOTE — Telephone Encounter (Signed)
 Faxed outside referral for Phase II Cardiac Rehab to [facility]

## 2024-02-15 NOTE — TOC Transition Note (Signed)
 Transition of Care (TOC) - Discharge Note Rayfield Gobble RN, BSN Inpatient Care Management Unit 4E- RN Case Manager See Treatment Team for direct phone #   Patient Details  Name: Kiara [name] MRN: [id number] Date of Birth: 06/27/1951  Transition of Care [facility]) CM/SW Contact:  Gobble Rayfield Hurst, RN Phone Number: 02/15/2024, 3:03 PM   Clinical Narrative:    Pt stable for transition home today,  Per previous CM note- DME- RW arranged w/ [employer].  HH referral was sent to [facility].   CM contacted [facility] liaison to confirm referral- per liaison they are unable to accept however she will outsource to [facility] or [facility] to get [facility] in place.   Family to transport home,   No further TOC needs noted.    Final next level of care: Home w Home Health Services Barriers to Discharge: Barriers Resolved   Patient Goals and CMS Choice Patient states their goals for this hospitalization and ongoing recovery are:: wants to remain independent   Choice offered to / list presented to : Patient      Discharge Placement                       Discharge Plan and Services Additional resources added to the After Visit Summary for     Discharge Planning Services: CM Consult Post Acute Care Choice: Home Health          DME Arranged: Vannie rolling DME Agency: [facility] Date DME Agency Contacted: 02/14/24 Time DME Agency Contacted: [phone] Representative spoke with at DME Agency: [facility] Arranged: RN, PT [facility] Agency: [facility] Date [facility] Agency Contacted: 02/14/24 Time HH Agency Contacted: [phone] Representative spoke with at [facility] Agency: [place]  Social Drivers of Health (SDOH) Interventions SDOH Screenings   Food Insecurity: No Food Insecurity (02/06/2024)  Housing: Low Risk  (02/06/2024)  Transportation Needs: No Transportation Needs (02/06/2024)  Utilities: Not At Risk (02/06/2024)  Social Connections: Moderately Integrated (02/06/2024)  Tobacco Use: Medium Risk  (02/13/2024)     Readmission Risk Interventions    02/15/2024    3:03 PM 02/07/2024    2:46 PM  Readmission Risk Prevention Plan  Transportation Screening Complete Complete  PCP or Specialist Appt within 5-7 Days  Complete  Home Care Screening  Complete  Medication Review (RN CM)  Referral to Pharmacy  HRI or Home Care Consult Complete   Social Work Consult for Recovery Care Planning/Counseling Complete   Palliative Care Screening Not Applicable   Medication Review Oceanographer) Complete

## 2024-02-15 NOTE — Inpatient Diabetes Management (Addendum)
 Inpatient Diabetes Program Recommendations  AACE/ADA: New Consensus Statement on Inpatient Glycemic Control (2015)  Target Ranges:  Prepandial:   less than 140 mg/dL      Peak postprandial:   less than 180 mg/dL (1-2 hours)      Critically ill patients:  140 - 180 mg/dL   Lab Results  Component Value Date   GLUCAP 175 (H) 02/15/2024   HGBA1C 10.0 (H) 02/10/2024   Review of Glycemic Control  Latest Reference Range & Units 02/14/24 06:45 02/14/24 11:05 02/14/24 15:45 02/14/24 21:00 02/15/24 01:15  Glucose-Capillary 70 - 99 mg/dL 451 (HH) 534 (H) 564 (H) 325 (H) 148 (H)     Latest Reference Range & Units 02/15/24 04:27 02/15/24 05:29 02/15/24 06:12 02/15/24 07:34 02/15/24 11:51  Glucose-Capillary 70 - 99 mg/dL 73 66 (L) 824 (H) 811 (H) 175 (H)   Diabetes history: DM 1 Outpatient Diabetes medications:  From office note with Benton Rio 11/27/23: 7a-12a basal rate to 1.55 units per hour instead of 2 units per hour given she has had some recent drops. She can continue other settings: target 150; insulin  to carb ratio: 1:15, insulin  sensitivity of 50, active insulin  time of 4 hrs.  Current orders for Inpatient glycemic control:  Novolog  0-20 units q 4 hours Semglee  25 units bid Novolog  10 units tid with meals  Prednisone  85 mg daily  Inpatient Diabetes Program Recommendations:    Note discharge orders.  Referral received for discharge recommendations.  Per MD, patient will need 9-10 days more for Prednisone .  She has been on basal/bolus insulin  while in the hospital.  Recommend restart of insulin  pump once patient gets home this afternoon.  (She did receive Lantus  25 units this morning, however with steroids, she should be okay to restart pump this afternoon).  Reminded patient that she does have basal insulin  on board.  She will restart Dexcom as well as pump for monitoring.  I did reach out to Endocrinology, NP- Benton Rio for potential pump adjustments with steroids.  She  recommended that CHO coverage be changed to 1 unit/ 10 grams of CHO and that Correction factor be changed to 1 unit/40 mg/dL (instead of 50 mg/dL)-  to allow patient to bolus a little more while on steroids.  I assisted patient in making these changes and encouraged her to call provider if CBG's remain elevated.  Reminded patient that blood sugars may be higher on steroids and that she needs close follow-up with her provider at discharge.  Patient appreciative of visit.  Communicated with RN and notified Dr. Vernon.  Addendum 12:40p-  Per MD, patient will not need steroids at discharge therefore will help patient change settings back to previous at discharge and let Benton Rio, NP know.     Thanks,  Randall Bullocks, RN, BC-ADM Inpatient Diabetes Coordinator Pager [phone]  (8a-5p)

## 2024-02-19 NOTE — Progress Notes (Signed)
 [facility]INIC CONSULT NOTE  Referring Physician: Lawrance Handing, MD Primary Care: Lawrance Handing, MD Primary Cardiologist: Diannah SHAUNNA [name], MD  Chief Complaint:  HPI: Kiara [name] is a 73 y.o. female with chronic HFrEF, CAD s/p CABG x2 14', DB, HLD, PAD and CKD stage 3a. AHF team to see for low output HF.    Followed by Dr. Darron for PAD and Dr. Mallipeddi with Cardiology.    Admitted earlier this week with SOB and chest pressure. Reports compliance with all medications and follows strict 2g Na diet. Work up in the ED showed HsTrop 2.7K> 2.9K>2.5K. BNP 1.2K. K 3.2, Na 135, SCr 1.65. AKI on admission. Diuretics were held for several days to allow renal recovery prior to [facility]. L/RHC today with patent LIMA to LAD and SVG to PDA with severe native vessel disease. No targets for PCI. RA 19, PA 78/48 (47), PCW 28, PA sat 45%, Fick CO/CI 4.6/2.3. TD CO/CI 3.6/1.8. Dr. Wonda asked us  to see for help with diuresis with low output HF.    Son and grandson at the bedside. Patient on bedrest post cath. Resting comfortably in bed. Denies CP/SOB. Asking about cpap.    Cardiac studies reviewed:  L/RHC today with patent LIMA to LAD and SVG to PDA with severe native vessel disease. No targets for PCI. RA 19, PA 78/48 (47), PCW 28, PA sat 45%, Fick CO/CI 4.6/2.3. TD CO/CI 3.6/1.8.  Echo 02/05/24 EF 30%, LV with GHK, mild LVH, RV not well seen, mild MR LHC 19': patent LIMA to LAD and possibly patent SVG to RPDA with normal LVEF       Past Medical History:  Diagnosis Date   Anemia    Anxiety    Arthritis    Carotid artery disease (HCC)    a. Last carotid study in 2014 showed 1-39% RICA, 40-59% LICA.   Chronic combined systolic (congestive) and diastolic (congestive) heart failure (HCC)    a. EF 40-45% in 2014. b. EF 30-35% in 07/2017.   CKD (chronic kidney disease), stage III (HCC)    Coronary artery disease    a. s/p CABG 2014. b. Cath 07/2017 for NSTEMI -> med rx.   Diabetes  mellitus (HCC)    Diabetic peripheral neuropathy (HCC)    Eczema    GERD (gastroesophageal reflux disease)    H/O hiatal hernia    Hyperlipidemia    Hypertension    Myocardial infarction (HCC)    PONV (postoperative nausea and vomiting)    Sleep apnea    Umbilical hernia     Current Outpatient Medications  Medication Sig Dispense Refill   acetaminophen  (TYLENOL ) 650 MG CR tablet Take 1,300 mg by mouth every 8 (eight) hours as needed for pain.     albuterol  (VENTOLIN  HFA) 108 (90 Base) MCG/ACT inhaler Inhale 2 puffs into the lungs every 4 (four) hours as needed for shortness of breath or wheezing.     aspirin  EC 81 MG tablet Take 81 mg by mouth daily.      atorvastatin  (LIPITOR ) 80 MG tablet Take 1 tablet (80 mg total) by mouth daily. 30 tablet 0   Cholecalciferol (VITAMIN D ) 2000 units tablet Take 2,000 Units by mouth daily.     Cinnamon 500 MG capsule Take 500 mg by mouth daily.     clopidogrel  (PLAVIX ) 75 MG tablet Take 1 tablet (75 mg total) by mouth daily. 30 tablet 0   Coenzyme Q10 (COQ10) 100 MG CAPS Take 100 mg by mouth daily.  DULoxetine  (CYMBALTA ) 60 MG capsule Take 60 mg by mouth daily.     ezetimibe  (ZETIA ) 10 MG tablet Take 1 tablet (10 mg total) by mouth daily. 90 tablet 3   fluticasone  (FLONASE ) 50 MCG/ACT nasal spray Place 2 sprays into both nostrils at bedtime as needed for allergies or rhinitis.     furosemide  (LASIX ) 40 MG tablet Take 1 tablet (40 mg total) by mouth daily. 30 tablet 0   gabapentin  (NEURONTIN ) 300 MG capsule Take 1 capsule (300 mg total) by mouth 2 (two) times daily. 60 capsule 0   hydrocortisone  cream 1 % Apply 1 Application topically daily as needed for itching.     insulin  aspart (NOVOLOG ) 100 UNIT/ML injection Inject 1.75 Units into the skin See admin instructions. 1.25 Units 12 Am-7 Am,  1.75 u/hr 7 AM  Til 12 AM, Bolus 1 unit for every 15 carbs, 1 Unit for every 50 points greater than 150 Insulin  pump     levocetirizine (XYZAL ) 5 MG tablet  Take 5 mg by mouth daily.      montelukast (SINGULAIR) 10 MG tablet Take 10 mg by mouth daily.     ofloxacin  (OCUFLOX ) 0.3 % ophthalmic solution Place 2 drops into the left eye 3 (three) times daily.     omeprazole (PRILOSEC) 40 MG capsule Take 40 mg by mouth daily.     sacubitril -valsartan  (ENTRESTO ) 24-26 MG Take 1 tablet by mouth 2 (two) times daily. 60 tablet 0   Turmeric 500 MG TABS Take 1 tablet by mouth daily.     No current facility-administered medications for this visit.    Allergies  Allergen Reactions   Bactrim [Sulfamethoxazole-Trimethoprim] Rash   Codeine Nausea And Vomiting   Hydromorphone  Nausea Only and Other (See Comments)    Diaphoretic, blood pressure issues, coughing,   Silvadene [Silver Sulfadiazine] Other (See Comments)    Red hot rash and blisters   Adhesive [Tape] Rash    Band aid brand     Mobic [Meloxicam] Swelling   Morphine  And Codeine Nausea Only and Other (See Comments)    Confusion       Social History   Socioeconomic History   Marital status: Widowed    Spouse name: Not on file   Number of children: Not on file   Years of education: Not on file   Highest education level: Not on file  Occupational History   Not on file  Tobacco Use   Smoking status: Former    Current packs/day: 0.00    Types: Cigarettes    Start date: 05/30/1985    Quit date: 05/30/2010    Years since quitting: 13.7    Passive exposure: Never   Smokeless tobacco: Never   Tobacco comments:    STOPPED 2007  Vaping Use   Vaping status: Never Used  Substance and Sexual Activity   Alcohol use: No   Drug use: No   Sexual activity: Not on file  Other Topics Concern   Not on file  Social History Narrative   Not on file   Social Drivers of Health   Financial Resource Strain: Not on file  Food Insecurity: No Food Insecurity (02/06/2024)   Hunger Vital Sign    Worried About Running Out of Food in the Last Year: Never true    Ran Out of Food in the Last Year: Never  true  Transportation Needs: No Transportation Needs (02/06/2024)   PRAPARE - Administrator, Civil Service (Medical): No  Lack of Transportation (Non-Medical): No  Physical Activity: Not on file  Stress: Not on file  Social Connections: Moderately Integrated (02/06/2024)   Social Connection and Isolation Panel    Frequency of Communication with Friends and Family: Three times a week    Frequency of Social Gatherings with Friends and Family: Three times a week    Attends Religious Services: More than 4 times per year    Active Member of Clubs or Organizations: Yes    Attends Banker Meetings: More than 4 times per year    Marital Status: Widowed  Intimate Partner Violence: Not At Risk (02/06/2024)   Humiliation, Afraid, Rape, and Kick questionnaire    Fear of Current or Ex-Partner: No    Emotionally Abused: No    Physically Abused: No    Sexually Abused: No      Family History  Problem Relation Age of Onset   Heart attack Mother    Heart disease Mother    Hypertension Mother    Heart attack Brother    Heart disease Brother    Hypertension Brother     There were no vitals filed for this visit.  PHYSICAL EXAM: General:  Well appearing. No respiratory difficulty HEENT: normal Neck: supple. no JVD. Carotids 2+ bilat; no bruits. No lymphadenopathy or thryomegaly appreciated. Cor: PMI nondisplaced. Regular rate & rhythm. No rubs, gallops or murmurs. Lungs: clear Abdomen: soft, nontender, nondistended. No hepatosplenomegaly. No bruits or masses. Good bowel sounds. Extremities: no cyanosis, clubbing, rash, edema Neuro: alert & oriented x 3, cranial nerves grossly intact. moves all 4 extremities w/o difficulty. Affect pleasant.  ECG:   ASSESSMENT & PLAN: 1. Acute on chronic systolic CHF: Ischemic cardiomyopathy. Echo this admission with EF 30%, global hypokinesis (prior echo in 2020 with normal EF). 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   - cMRI 7/14: LVEF 34%, RVEF 48%, LGE LAD territory with some viability, nonviable myocardium at mid-apical inferoseptum. Findings suggest ischemic cardiomyopathy. - Stable off milrinone . - Appears euvolemic. Continue lasix  40 mg daily. Watch renal function. Scr slightly higher today. - Continue Entresto  24/26 mg bid, copay for entresto  is $177 (patient assistance pending) - No SGLT2i with type I diabetes  - Hold off on spiro, K upper normal - Off beta blocker with low-output   2. AKI on CKD stage 3b: Cr up to 2.4. Suspect cardiorenal syndrome, with initial contrast bolus from CTA chest playing a role. sCr up slightly today, 1.7. Will follow. - Continue to follow    3. CAD: H/o CABG x 2.  NSTEMI at presentation with HS-TnI to 2900.  Cath showed patent LIMA-LAD and SVG-PDA with unchanged native vessels (occluded RCA, occluded OMs). No target for intervention.  - Continue ASA 81/Plavix .  - Continue atorvastatin  and Zetia .  - No s/s angina   4. PAD: Followed as outpatient by Dr. Darron.  - No rest pain  - Would not restart pletal  at discharge, contraindicated in CHF   5. 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  - Improved with diuresis. Outpatient f/u   6. Anemia: Hgb stable - Iron  stores are low.  - Given IV Fe this admit   7. Hypokalemia - Resolved   8. Leukocytosis: WBCs up to 30 K. Afebrile. - PCT 0.39>0.11, BC X 2 NG, UA on admit okay - No  clear evidence of infection.  - Likely exacerbated by steroid use, significant jump after starting prednisone    9. DM I - Uncontrolled, A1c 10% - Followed by endocrine in outpatient setting   10. Possible temporal arteritis - Biopsy pending - Now on prednisone  - ESR and CRP elevated   Stable from HF standpoint. Has close follow-up in [facility] scheduled.

## 2024-02-22 ENCOUNTER — Encounter (HOSPITAL_COMMUNITY): Payer: Self-pay

## 2024-02-22 ENCOUNTER — Ambulatory Visit (HOSPITAL_COMMUNITY)
Admit: 2024-02-22 | Discharge: 2024-02-22 | Disposition: A | Discharge status: Home / Self Care | Attending: Family Medicine | Admitting: Family Medicine

## 2024-02-22 ENCOUNTER — Ambulatory Visit (HOSPITAL_COMMUNITY): Payer: Self-pay | Admitting: Family Medicine

## 2024-02-22 ENCOUNTER — Other Ambulatory Visit (HOSPITAL_COMMUNITY): Payer: Self-pay

## 2024-02-22 VITALS — BP 104/60 | HR 94 | Ht 63.0 in | Wt 184.0 lb

## 2024-02-22 DIAGNOSIS — I272 Pulmonary hypertension, unspecified: Secondary | ICD-10-CM | POA: Diagnosis not present

## 2024-02-22 DIAGNOSIS — R109 Unspecified abdominal pain: Secondary | ICD-10-CM | POA: Insufficient documentation

## 2024-02-22 DIAGNOSIS — I739 Peripheral vascular disease, unspecified: Secondary | ICD-10-CM

## 2024-02-22 DIAGNOSIS — E785 Hyperlipidemia, unspecified: Secondary | ICD-10-CM | POA: Insufficient documentation

## 2024-02-22 DIAGNOSIS — R42 Dizziness and giddiness: Secondary | ICD-10-CM | POA: Insufficient documentation

## 2024-02-22 DIAGNOSIS — Z7902 Long term (current) use of antithrombotics/antiplatelets: Secondary | ICD-10-CM | POA: Diagnosis not present

## 2024-02-22 DIAGNOSIS — Z794 Long term (current) use of insulin: Secondary | ICD-10-CM | POA: Diagnosis not present

## 2024-02-22 DIAGNOSIS — N1832 Chronic kidney disease, stage 3b: Secondary | ICD-10-CM | POA: Insufficient documentation

## 2024-02-22 DIAGNOSIS — E1022 Type 1 diabetes mellitus with diabetic chronic kidney disease: Secondary | ICD-10-CM | POA: Diagnosis not present

## 2024-02-22 DIAGNOSIS — N183 Chronic kidney disease, stage 3 unspecified: Secondary | ICD-10-CM | POA: Diagnosis not present

## 2024-02-22 DIAGNOSIS — I255 Ischemic cardiomyopathy: Secondary | ICD-10-CM | POA: Diagnosis not present

## 2024-02-22 DIAGNOSIS — Z951 Presence of aortocoronary bypass graft: Secondary | ICD-10-CM | POA: Insufficient documentation

## 2024-02-22 DIAGNOSIS — I25810 Atherosclerosis of coronary artery bypass graft(s) without angina pectoris: Secondary | ICD-10-CM

## 2024-02-22 DIAGNOSIS — Z87891 Personal history of nicotine dependence: Secondary | ICD-10-CM | POA: Diagnosis not present

## 2024-02-22 DIAGNOSIS — Z79899 Other long term (current) drug therapy: Secondary | ICD-10-CM | POA: Diagnosis not present

## 2024-02-22 DIAGNOSIS — D649 Anemia, unspecified: Secondary | ICD-10-CM | POA: Insufficient documentation

## 2024-02-22 DIAGNOSIS — E1051 Type 1 diabetes mellitus with diabetic peripheral angiopathy without gangrene: Secondary | ICD-10-CM | POA: Diagnosis not present

## 2024-02-22 DIAGNOSIS — E1010 Type 1 diabetes mellitus with ketoacidosis without coma: Secondary | ICD-10-CM

## 2024-02-22 DIAGNOSIS — I130 Hypertensive heart and chronic kidney disease with heart failure and stage 1 through stage 4 chronic kidney disease, or unspecified chronic kidney disease: Secondary | ICD-10-CM | POA: Diagnosis not present

## 2024-02-22 DIAGNOSIS — I5022 Chronic systolic (congestive) heart failure: Secondary | ICD-10-CM | POA: Diagnosis present

## 2024-02-22 DIAGNOSIS — I214 Non-ST elevation (NSTEMI) myocardial infarction: Secondary | ICD-10-CM | POA: Diagnosis not present

## 2024-02-22 DIAGNOSIS — Z7982 Long term (current) use of aspirin: Secondary | ICD-10-CM | POA: Insufficient documentation

## 2024-02-22 DIAGNOSIS — E1042 Type 1 diabetes mellitus with diabetic polyneuropathy: Secondary | ICD-10-CM | POA: Diagnosis not present

## 2024-02-22 DIAGNOSIS — I2510 Atherosclerotic heart disease of native coronary artery without angina pectoris: Secondary | ICD-10-CM | POA: Insufficient documentation

## 2024-02-22 DIAGNOSIS — G4733 Obstructive sleep apnea (adult) (pediatric): Secondary | ICD-10-CM | POA: Insufficient documentation

## 2024-02-22 DIAGNOSIS — I2721 Secondary pulmonary arterial hypertension: Secondary | ICD-10-CM | POA: Insufficient documentation

## 2024-02-22 LAB — BASIC METABOLIC PANEL WITH GFR
Anion gap: 9 (ref 5–15)
BUN: 34 mg/dL — ABNORMAL HIGH (ref 8–23)
CO2: 29 mmol/L (ref 22–32)
Calcium: 8.9 mg/dL (ref 8.9–10.3)
Chloride: 91 mmol/L — ABNORMAL LOW (ref 98–111)
Creatinine, Ser: 1.64 mg/dL — ABNORMAL HIGH (ref 0.44–1.00)
GFR, Estimated: 33 mL/min — ABNORMAL LOW (ref >60)
Glucose, Bld: 493 mg/dL — ABNORMAL HIGH (ref 70–99)
Potassium: 5.9 mmol/L — ABNORMAL HIGH (ref 3.5–5.1)
Sodium: 129 mmol/L — ABNORMAL LOW (ref 135–145)

## 2024-02-22 LAB — BRAIN NATRIURETIC PEPTIDE: B Natriuretic Peptide: 245.1 pg/mL — ABNORMAL HIGH (ref 0.0–100.0)

## 2024-02-22 MED ORDER — LOSARTAN POTASSIUM 25 MG PO TABS: 12.5000 mg | ORAL_TABLET | Freq: Every day | ORAL | 3 refills | Status: DC

## 2024-02-22 MED ORDER — LOKELMA 10 G PO PACK: PACK | ORAL | 0 refills | Status: AC

## 2024-02-22 MED ORDER — LOKELMA 10 G PO PACK: PACK | ORAL | 0 refills | Status: DC

## 2024-02-22 NOTE — Telephone Encounter (Signed)
-----   Message from [place] sent at 02/22/2024  1:07 PM EDT ----- Sodium low and blood sugar very high. K is dangerously elevated.  Concern for DKA. She needs to check her sugar now. Do NOT start losartan as discussed at visit. She needs Lokelma 10 g x 1 dose now. Repeat BMET on Monday ----- Message ----- From: Rebecka, [facility] [place] Sent: 02/22/2024  11:36 AM EDT To: Harlene CHRISTELLA Gainer, FNP

## 2024-02-22 NOTE — Telephone Encounter (Signed)
 Patients sons, Eva and Carlin advised and verbalized understanding,med list updated to reflect changes.,lab orders entered, patient is going to go to [facility].   Meds ordered this encounter  Medications   sodium zirconium cyclosilicate (LOKELMA) 10 g PACK packet    Sig: Use 1 packet by mouth daily as needed as directed by the HF Clinic    Dispense:  30 packet    Refill:  0    D/C Losartan   Orders Placed This Encounter  Procedures   Basic metabolic panel with GFR    Standing Status:   Future    Expected Date:   02/25/2024    Expiration Date:   02/21/2025    Release to patient:   Immediate    Release to patient:   Immediate [1]

## 2024-02-22 NOTE — Patient Instructions (Signed)
 Good to see you today!  STOP Entresto    START losartan 12.5 mg (1/2 tablet) every night  Your physician has requested that you have an echocardiogram. Echocardiography is a painless test that uses sound waves to create images of your heart. It provides your doctor with information about the size and shape of your heart and how well your heart's chambers and valves are working. This procedure takes approximately one hour. There are no restrictions for this procedure. Please do NOT wear cologne, perfume, aftershave, or lotions (deodorant is allowed). Please arrive 15 minutes prior to your appointment time.  Please note: We ask at that you not bring children with you during ultrasound (echo/ vascular) testing. Due to room size and safety concerns, children are not allowed in the ultrasound rooms during exams. Our front office staff cannot provide observation of children in our lobby area while testing is being conducted. An adult accompanying a patient to their appointment will only be allowed in the ultrasound room at the discretion of the ultrasound technician under special circumstances. We apologize for any inconvenience.  Your physician recommends that you schedule a follow-up appointment in 3 weeks with app clinic    Follow up in 3  months with echocardiogram(October) Call office in August to schedule an appointment If you have any questions or concerns before your next appointment please send us  a message through [place] or call our office at [phone].    TO LEAVE A MESSAGE FOR THE NURSE SELECT OPTION 2, PLEASE LEAVE A MESSAGE INCLUDING: YOUR NAME DATE OF BIRTH CALL BACK NUMBER REASON FOR CALL**this is important as we prioritize the call backs  YOU WILL RECEIVE A CALL BACK THE SAME DAY AS LONG AS YOU CALL BEFORE 4:00 PM At the [facility], you and your health needs are our priority. As part of our continuing mission to provide you with exceptional heart care, we have  created designated Provider Care Teams. These Care Teams include your primary Cardiologist (physician) and Advanced Practice Providers (APPs- Physician Assistants and Nurse Practitioners) who all work together to provide you with the care you need, when you need it.   You may see any of the following providers on your designated Care Team at your next follow up: Dr Toribio Fuel Dr Ezra Shuck Dr. Ria Commander Dr. Morene Brownie Amy Lenetta, NP Caffie Shed, [place] [facility] [place], [place] Beckey Coe, NP [place] Lee, NP Ellouise Class, NP Tinnie Redman, PharmD Jaun Bash, PharmD   Please be sure to bring in all your medications bottles to every appointment.    Thank you for choosing [facility]

## 2024-02-25 ENCOUNTER — Other Ambulatory Visit (HOSPITAL_COMMUNITY): Payer: Self-pay

## 2024-02-25 ENCOUNTER — Other Ambulatory Visit (HOSPITAL_COMMUNITY): Payer: Self-pay | Admitting: Cardiology

## 2024-02-25 DIAGNOSIS — I5022 Chronic systolic (congestive) heart failure: Secondary | ICD-10-CM

## 2024-02-26 ENCOUNTER — Other Ambulatory Visit (HOSPITAL_COMMUNITY): Payer: Self-pay | Admitting: Cardiology

## 2024-02-26 ENCOUNTER — Other Ambulatory Visit (HOSPITAL_COMMUNITY): Payer: Self-pay

## 2024-02-26 ENCOUNTER — Ambulatory Visit (HOSPITAL_COMMUNITY): Payer: Self-pay | Admitting: Family Medicine

## 2024-02-26 ENCOUNTER — Telehealth (HOSPITAL_COMMUNITY): Payer: Self-pay

## 2024-02-26 DIAGNOSIS — I5022 Chronic systolic (congestive) heart failure: Secondary | ICD-10-CM

## 2024-02-26 LAB — BASIC METABOLIC PANEL WITH GFR
BUN/Creatinine Ratio: 17 (ref 12–28)
BUN: 23 mg/dL (ref 8–27)
CO2: 24 mmol/L (ref 20–29)
Calcium: 8.7 mg/dL (ref 8.7–10.3)
Chloride: 94 mmol/L — ABNORMAL LOW (ref 96–106)
Creatinine, Ser: 1.38 mg/dL — ABNORMAL HIGH (ref 0.57–1.00)
Glucose: 288 mg/dL — ABNORMAL HIGH (ref 70–99)
Potassium: 5.5 mmol/L — ABNORMAL HIGH (ref 3.5–5.2)
Sodium: 135 mmol/L (ref 134–144)
eGFR: 41 mL/min/1.73 — ABNORMAL LOW (ref >59)

## 2024-02-26 MED ORDER — LOKELMA 10 G PO PACK: 10.0000 g | PACK | Freq: Once | ORAL | 0 refills | Status: AC

## 2024-02-26 NOTE — Addendum Note (Signed)
 Addended by: JERONA DALTON HERO on: 02/26/2024 03:16 PM   Modules accepted: Orders

## 2024-02-26 NOTE — Telephone Encounter (Signed)
 Advanced Heart Failure Patient Advocate Encounter  Patient left message requesting a call back. Spoke to patient by phone, she stated that Entresto  has been stopped for now, but once started again it will need to be sent in. Any 30 day supply can be filled with [facility], 90 day supply will need to be sent to [facility] for mail order. Patient will reach out if there are further issues when the medication is restarted.  Rachel DEL, CPhT Rx Patient Advocate Phone: [phone]

## 2024-03-01 LAB — BASIC METABOLIC PANEL WITH GFR
BUN/Creatinine Ratio: 18 (ref 12–28)
BUN: 24 mg/dL (ref 8–27)
CO2: 25 mmol/L (ref 20–29)
Calcium: 8.9 mg/dL (ref 8.7–10.3)
Chloride: 95 mmol/L — ABNORMAL LOW (ref 96–106)
Creatinine, Ser: 1.33 mg/dL — ABNORMAL HIGH (ref 0.57–1.00)
Glucose: 491 mg/dL — ABNORMAL HIGH (ref 70–99)
Potassium: 5.7 mmol/L — ABNORMAL HIGH (ref 3.5–5.2)
Sodium: 133 mmol/L — ABNORMAL LOW (ref 134–144)
eGFR: 43 mL/min/1.73 — ABNORMAL LOW (ref >59)

## 2024-03-03 ENCOUNTER — Ambulatory Visit (HOSPITAL_COMMUNITY): Payer: Self-pay | Admitting: Family Medicine

## 2024-03-05 NOTE — Telephone Encounter (Signed)
 Left message asking pt to check her My Chart. Sent results message via My Chart.

## 2024-03-06 NOTE — Telephone Encounter (Signed)
 Pt read My Chart message on 03/05/24 at 8:22 pm.

## 2024-03-07 ENCOUNTER — Ambulatory Visit: Admitting: Nurse Practitioner

## 2024-03-07 ENCOUNTER — Encounter: Payer: Self-pay | Admitting: Nurse Practitioner

## 2024-03-07 ENCOUNTER — Telehealth (HOSPITAL_COMMUNITY): Payer: Self-pay | Admitting: *Deleted

## 2024-03-07 VITALS — BP 128/66 | HR 105 | Ht 63.0 in | Wt 184.0 lb

## 2024-03-07 DIAGNOSIS — E559 Vitamin D deficiency, unspecified: Secondary | ICD-10-CM

## 2024-03-07 DIAGNOSIS — N1832 Chronic kidney disease, stage 3b: Secondary | ICD-10-CM

## 2024-03-07 DIAGNOSIS — E1022 Type 1 diabetes mellitus with diabetic chronic kidney disease: Secondary | ICD-10-CM

## 2024-03-07 DIAGNOSIS — Z794 Long term (current) use of insulin: Secondary | ICD-10-CM

## 2024-03-07 DIAGNOSIS — E782 Mixed hyperlipidemia: Secondary | ICD-10-CM | POA: Diagnosis not present

## 2024-03-07 DIAGNOSIS — I10 Essential (primary) hypertension: Secondary | ICD-10-CM

## 2024-03-07 MED ORDER — OMNIPOD 5 DEXG7G6 PODS GEN 5 MISC: 6 refills | Status: AC

## 2024-03-07 MED ORDER — OMNIPOD 5 DEXG7G6 INTRO GEN 5 KIT
PACK | 0 refills | Status: AC
Start: 2024-03-07 — End: ?

## 2024-03-07 NOTE — Progress Notes (Signed)
 Endocrinology Follow Up Note       03/07/2024, 11:35 AM   Subjective:    Patient ID: Kiara [name], female    DOB: [birth year].  Kiara [name] is being seen in follow up after being seen in consultation for management of currently uncontrolled symptomatic diabetes requested by  Lawrance Handing, MD.   Past Medical History:  Diagnosis Date   Anemia    Anxiety    Arthritis    Carotid artery disease (HCC)    a. Last carotid study in 2014 showed 1-39% RICA, 40-59% LICA.   Chronic combined systolic (congestive) and diastolic (congestive) heart failure (HCC)    a. EF 40-45% in 2014. b. EF 30-35% in 07/2017.   CKD (chronic kidney disease), stage III (HCC)    Coronary artery disease    a. s/p CABG 2014. b. Cath 07/2017 for NSTEMI -> med rx.   Diabetes mellitus (HCC)    Diabetic peripheral neuropathy (HCC)    Eczema    GERD (gastroesophageal reflux disease)    H/O hiatal hernia    Hyperlipidemia    Hypertension    Myocardial infarction (HCC)    PONV (postoperative nausea and vomiting)    Sleep apnea    Umbilical hernia     Past Surgical History:  Procedure Laterality Date   ABDOMINAL AORTOGRAM W/LOWER EXTREMITY N/A 03/06/2018   Procedure: ABDOMINAL AORTOGRAM W/LOWER EXTREMITY;  Surgeon: Darron Deatrice LABOR, MD;  Location: [facility];  Service: Cardiovascular;  Laterality: N/A;   ABDOMINAL AORTOGRAM W/LOWER EXTREMITY N/A 05/16/2023   Procedure: ABDOMINAL AORTOGRAM W/LOWER EXTREMITY;  Surgeon: Darron Deatrice LABOR, MD;  Location: [facility];  Service: Cardiovascular;  Laterality: N/A;   ABDOMINAL HYSTERECTOMY     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     ARTERY BIOPSY Right 02/13/2024   Procedure: BIOPSY TEMPORAL ARTERY;  Surgeon: Magda Debby SAILOR, MD;  Location: [facility] OR;  Service: Vascular;  Laterality: Right;   CARDIAC CATHETERIZATION     CARPAL TUNNEL RELEASE     CESAREAN SECTION  1983   CORONARY  ARTERY BYPASS GRAFT N/A 04/11/2013   Procedure: CORONARY ARTERY BYPASS GRAFTING (CABG);  Surgeon: Dorise MARLA Fellers, MD;  Location: [facility] OR;  Service: Open Heart Surgery;  Laterality: N/A;   EYE SURGERY Bilateral    laser   FRACTURE SURGERY Left 2005   PERIPHERAL INTRAVASCULAR LITHOTRIPSY  05/16/2023   Procedure: PERIPHERAL INTRAVASCULAR LITHOTRIPSY;  Surgeon: Darron Deatrice LABOR, MD;  Location: [facility];  Service: Cardiovascular;;   PERIPHERAL VASCULAR INTERVENTION  05/16/2023   Procedure: PERIPHERAL VASCULAR INTERVENTION;  Surgeon: Darron Deatrice LABOR, MD;  Location: [facility];  Service: Cardiovascular;;   RIGHT HEART CATH AND CORONARY/GRAFT ANGIOGRAPHY N/A 02/08/2024   Procedure: RIGHT HEART CATH AND CORONARY/GRAFT ANGIOGRAPHY;  Surgeon: Elmira Newman PARAS, MD;  Location: [facility];  Service: Cardiovascular;  Laterality: N/A;   RIGHT/LEFT HEART CATH AND CORONARY/GRAFT ANGIOGRAPHY N/A 08/06/2017   Procedure: RIGHT/LEFT HEART CATH AND CORONARY/GRAFT ANGIOGRAPHY;  Surgeon: Mady Bruckner, MD;  Location: [facility];  Service: Cardiovascular;  Laterality: N/A;    Social History   Socioeconomic History   Marital status:  Widowed    Spouse name: Not on file   Number of children: Not on file   Years of education: Not on file   Highest education level: Not on file  Occupational History   Not on file  Tobacco Use   Smoking status: Former    Current packs/day: 0.00    Types: Cigarettes    Start date: 05/30/1985    Quit date: 05/30/2010    Years since quitting: 13.7    Passive exposure: Never   Smokeless tobacco: Never   Tobacco comments:    STOPPED 2007  Vaping Use   Vaping status: Never Used  Substance and Sexual Activity   Alcohol use: No   Drug use: No   Sexual activity: Not on file  Other Topics Concern   Not on file  Social History Narrative   Not on file   Social Drivers of Health   Financial Resource Strain: Low Risk  (02/26/2024)   Received  from [facility]   Overall Financial Resource Strain (CARDIA)    Difficulty of Paying Living Expenses: Not hard at all  Food Insecurity: No Food Insecurity (02/26/2024)   Received from [facility]   Hunger Vital Sign    Within the past 12 months, you worried that your food would run out before you got the money to buy more.: Never true    Within the past 12 months, the food you bought just didn't last and you didn't have money to get more.: Never true  Transportation Needs: No Transportation Needs (02/26/2024)   Received from [facility] - Transportation    Lack of Transportation (Medical): No    Lack of Transportation (Non-Medical): No  Physical Activity: Inactive (02/26/2024)   Received from [facility]   Exercise Vital Sign    On average, how many days per week do you engage in moderate to strenuous exercise (like a brisk walk)?: 0 days    On average, how many minutes do you engage in exercise at this level?: 0 min  Stress: No Stress Concern Present (02/26/2024)   Received from [facility] - Occupational Stress Questionnaire    Feeling of Stress : Not at all  Social Connections: Moderately Isolated (02/26/2024)   Received from [facility]   Social Connection and Isolation Panel    In a typical week, how many times do you talk on the phone with family, friends, or neighbors?: More than three times a week    How often do you get together with friends or relatives?: More than three times a week    How often do you attend church or religious services?: More than 4 times per year    Do you belong to any clubs or organizations such as church groups, unions, fraternal or athletic groups, or school groups?: No    How often do you attend meetings of the clubs or organizations you belong to?: Never    Are you married, widowed, divorced, separated, never married, or living with a partner?: Widowed    Family History   Problem Relation Age of Onset   Heart attack Mother    Heart disease Mother    Hypertension Mother    Heart attack Brother    Heart disease Brother    Hypertension Brother     Outpatient Encounter Medications as of 03/07/2024  Medication Sig   acetaminophen  (TYLENOL ) 650 MG CR tablet Take 1,300 mg by mouth every 8 (eight)  hours as needed for pain.   albuterol  (VENTOLIN  HFA) 108 (90 Base) MCG/ACT inhaler Inhale 2 puffs into the lungs every 4 (four) hours as needed for shortness of breath or wheezing.   aspirin  EC 81 MG tablet Take 81 mg by mouth daily.    atorvastatin  (LIPITOR ) 80 MG tablet Take 1 tablet (80 mg total) by mouth daily.   Cholecalciferol (VITAMIN D ) 2000 units tablet Take 2,000 Units by mouth daily.   Cinnamon 500 MG capsule Take 500 mg by mouth daily.   clopidogrel  (PLAVIX ) 75 MG tablet Take 1 tablet (75 mg total) by mouth daily.   Coenzyme Q10 (COQ10) 100 MG CAPS Take 100 mg by mouth daily.    DULoxetine  (CYMBALTA ) 60 MG capsule Take 60 mg by mouth daily.   ezetimibe  (ZETIA ) 10 MG tablet Take 1 tablet (10 mg total) by mouth daily.   fluticasone  (FLONASE ) 50 MCG/ACT nasal spray Place 2 sprays into both nostrils at bedtime as needed for allergies or rhinitis.   furosemide  (LASIX ) 40 MG tablet Take 1 tablet (40 mg total) by mouth daily.   gabapentin  (NEURONTIN ) 300 MG capsule Take 1 capsule (300 mg total) by mouth 2 (two) times daily.   hydrocortisone  cream 1 % Apply 1 Application topically daily as needed for itching.   insulin  aspart (NOVOLOG ) 100 UNIT/ML injection Inject 1.75 Units into the skin See admin instructions. 1.25 Units 12 Am-7 Am,  1.75 u/hr 7 AM  Til 12 AM, Bolus 1 unit for every 15 carbs, 1 Unit for every 50 points greater than 150 Insulin  pump   Insulin  Disposable Pump (OMNIPOD 5 DEXG7G6 INTRO GEN 5) KIT Change pod every 48-72 hours   Insulin  Disposable Pump (OMNIPOD 5 DEXG7G6 PODS GEN 5) MISC Change pod every 48-72 hours   levocetirizine (XYZAL ) 5 MG tablet  Take 5 mg by mouth daily.    montelukast (SINGULAIR) 10 MG tablet Take 10 mg by mouth daily.   omeprazole (PRILOSEC) 40 MG capsule Take 40 mg by mouth daily.   prednisoLONE  acetate (PRED FORTE ) 1 % ophthalmic suspension Place 1 drop into the left eye 2 (two) times daily.   sodium chloride  (MURO 128) 5 % ophthalmic solution Place 1 drop into the left eye 2 (two) times daily.   sodium zirconium cyclosilicate  (LOKELMA ) 10 g PACK packet Use 1 packet by mouth daily as needed as directed by the HF Clinic   Turmeric 500 MG TABS Take 1 tablet by mouth daily.   ofloxacin  (OCUFLOX ) 0.3 % ophthalmic solution Place 2 drops into the left eye 3 (three) times daily. (Patient not taking: Reported on 03/07/2024)   No facility-administered encounter medications on file as of 03/07/2024.    ALLERGIES: Allergies  Allergen Reactions   Bactrim [Sulfamethoxazole-Trimethoprim] Rash   Codeine Nausea And Vomiting   Hydromorphone  Nausea Only and Other (See Comments)    Diaphoretic, blood pressure issues, coughing,   Silvadene [Silver Sulfadiazine] Other (See Comments)    Red hot rash and blisters   Adhesive [Tape] Rash    Band aid brand     Mobic [Meloxicam] Swelling   Morphine  And Codeine Nausea Only and Other (See Comments)    Confusion     VACCINATION STATUS: Immunization History  Administered Date(s) Administered   Moderna Sars-Covid-2 Vaccination 10/13/2019, 12/16/2019    Diabetes She presents for her follow-up diabetic visit. She has type 1 diabetes mellitus. Onset time: diagnosed at age 25. Her disease course has been fluctuating. There are no hypoglycemic associated symptoms. Associated symptoms  include blurred vision and fatigue. There are no hypoglycemic complications. Symptoms are stable. Diabetic complications include heart disease, nephropathy, peripheral neuropathy, PVD and retinopathy. Risk factors for coronary artery disease include diabetes mellitus, family history, hypertension, obesity,  sedentary lifestyle, post-menopausal and tobacco exposure. Current diabetic treatment includes insulin  pump (Medtronic). She is compliant with treatment most of the time. Her weight is fluctuating minimally. She is following a generally healthy diet. When asked about meal planning, she reported none. She has not had a previous visit with a dietitian. She rarely participates in exercise. Her home blood glucose trend is increasing steadily. Her overall blood glucose range is 180-200 mg/dl. (She presents today, accompanied by a friend, with her CGM and pump showing fluctuating but high glucose overall.  Her most recent A1c on 8/5 was 10.6%, increasing.  She was in the hospital for MI, ended up getting high dose steroids at that time as well.  Since being home, she has tried to get back on track but admits she does sometime forget to bolus before her meals.  Analysis of her CGM shows TIR 17%, TAR 83%, TBR 0% with a GMI of 9.8%.  She is finishing up her supply of libres then will switch to Dexcom so that she can transition to Omnipod 5 in the future.) An ACE inhibitor/angiotensin II receptor blocker is not being taken. She sees a podiatrist.Eye exam is current.     Review of systems  Constitutional: + Minimally fluctuating body weight, current Body mass index is 32.59 kg/m., no fatigue, no subjective hyperthermia, no subjective hypothermia Eyes: + blurry vision- hx retinopathy, no xerophthalmia ENT: no sore throat, no nodules palpated in throat, no dysphagia/odynophagia, no hoarseness Cardiovascular: no chest pain, no shortness of breath, no palpitations, + leg swelling-improved Respiratory: no cough, no shortness of breath Gastrointestinal: no nausea/vomiting/diarrhea, intermittent constipation Musculoskeletal: + diffuse muscle/joint aches- most recently shoulder pain/weakness Skin: no rashes, no hyperemia Neurological: no tremors, no numbness, no tingling, no dizziness Psychiatric: no depression, no  anxiety  Objective:     BP 128/66 (BP Location: Left Arm, Patient Position: Sitting, Cuff Size: Large)   Pulse (!) 105   Ht 5' 3 (1.6 m)   Wt 184 lb (83.5 kg)   BMI 32.59 kg/m   Wt Readings from Last 3 Encounters:  03/07/24 184 lb (83.5 kg)  02/22/24 184 lb (83.5 kg)  02/15/24 191 lb 2.2 oz (86.7 kg)     BP Readings from Last 3 Encounters:  03/07/24 128/66  02/22/24 104/60  02/15/24 126/70     Physical Exam- Limited  Constitutional:  Body mass index is 32.59 kg/m. , not in acute distress, normal state of mind Eyes:  EOMI, no exophthalmos xtremities, no gross restriction of joint movements Skin:  no rashes, no hyperemia Neurological: no tremor with outstretched hands   Diabetic Foot Exam - Simple   No data filed      CMP ( most recent) CMP     Component Value Date/Time   NA 133 (L) 02/29/2024 1011   K 5.7 (H) 02/29/2024 1011   CL 95 (L) 02/29/2024 1011   CO2 25 02/29/2024 1011   GLUCOSE 491 (H) 02/29/2024 1011   GLUCOSE 493 (H) 02/22/2024 1043   BUN 24 02/29/2024 1011   CREATININE 1.33 (H) 02/29/2024 1011   CALCIUM  8.9 02/29/2024 1011   PROT 8.0 08/22/2017 1447   ALBUMIN  3.4 (L) 08/22/2017 1447   AST 28 08/22/2017 1447   ALT 20 08/22/2017 1447   ALKPHOS 94  08/22/2017 1447   BILITOT 0.5 08/22/2017 1447   EGFR 43 (L) 02/29/2024 1011   GFRNONAA 33 (L) 02/22/2024 1043     Diabetic Labs (most recent): Lab Results  Component Value Date   HGBA1C 10.0 (H) 02/10/2024   HGBA1C 10.6 11/14/2023   HGBA1C 13.5 07/31/2023     Lipid Panel ( most recent) Lipid Panel     Component Value Date/Time   CHOL 77 02/07/2024 0325   TRIG 102 02/07/2024 0325   HDL 29 (L) 02/07/2024 0325   CHOLHDL 2.7 02/07/2024 0325   VLDL 20 02/07/2024 0325   LDLCALC 28 02/07/2024 0325      Lab Results  Component Value Date   TSH 2.066 07/28/2019           Assessment & Plan:   1) 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  Her most recent A1c on 8/5 was 10.6%, increasing.  She was in the hospital for MI, ended up getting high dose steroids at that time as well.  Since being home, she has tried to get back on track but admits she does sometime forget to bolus before her meals.  Analysis of her CGM shows TIR 17%, TAR 83%, TBR 0% with a GMI of 9.8%.  She is finishing up her supply of libres then will switch to Dexcom so that she can transition to Omnipod 5 in the future.  - Kiara [name] has currently uncontrolled symptomatic type 1 DM since 73 years of age.   -Recent labs reviewed.  - I had a long discussion with her about the progressive nature of diabetes and the pathology behind its complications. -her diabetes is complicated by CHF, CKD stage 3b, CAD with MI, retinopathy, neuropathy, PVD.  The following Lifestyle Medicine recommendations according to American College of Lifestyle Medicine [facility]) were discussed and offered to patient and she agrees to start the journey:  A. Whole Foods, Plant-based plate comprising of fruits and vegetables, plant-based proteins, whole-grain carbohydrates was discussed in detail with the patient.   A list for source of those nutrients were also provided to the patient.  Patient will use only water or unsweetened tea for hydration. B.  The need to stay away from risky substances including alcohol, smoking; obtaining 7 to 9 hours of restorative sleep, at least 150 minutes of moderate intensity exercise weekly, the importance of healthy social connections,  and stress reduction techniques were discussed. C.  A full color page of  Calorie density of various food groups per pound showing examples of each food groups was provided to the patient.  - Nutritional counseling repeated at each appointment due to patients tendency to fall back in to old habits.  - The patient admits there  is a room for improvement in their diet and drink choices. -  Suggestion is made for the patient to avoid simple carbohydrates from their diet including Cakes, Sweet Desserts / Pastries, Ice Cream, Soda (diet and regular), Sweet Tea, Candies, Chips, Cookies, Sweet Pastries, Store Bought Juices, Alcohol in Excess of 1-2 drinks a day, Artificial Sweeteners, Coffee Creamer, and Sugar-free Products. This will help patient to have stable blood glucose profile and potentially avoid unintended weight gain.   - I encouraged the patient to switch to unprocessed or minimally processed complex starch and increased protein intake (animal or plant source), fruits, and vegetables.   -  Patient is advised to stick to a routine mealtimes to eat 3 meals a day and avoid unnecessary snacks (to snack only to correct hypoglycemia).  - I have approached her with the following individualized plan to manage her diabetes and patient agrees:   - I did make slight adjustment to her Medtronic pump settings today.  I changed her correction factor to 1:35 instead of 1:50 to help bring prandial readings down and hopefully prevent hypoglycemia as well.   -she is encouraged to continue monitoring glucose 4 times daily (using her CGM), before meals and before bed, and to call the clinic if she has readings less than 70 or above 300 for 3 tests in a row.  Will be changing her over to Dexcom G7 once she is done with her herlene supply.    She is interested in changing to Omnipod.  I did send this script in for her today.  - she is warned not to take insulin  without proper monitoring per orders. - Adjustment parameters are given to her for hypo and hyperglycemia in writing.  -She is not a candidate for non-insulin  therapies given her type 1 diagnosis.  - Specific targets for  A1c; LDL, HDL, and Triglycerides were discussed with the patient.  2) Blood Pressure /Hypertension: her blood pressure is controlled to target.   she is  advised to continue her current medications as prescribed by her PCP.  3) Lipids/Hyperlipidemia:    Review of her recent lipid panel from 07/31/23 showed controlled LDL at 49 .  she is advised to continue Crestor  40 mg daily at bedtime.  Side effects and precautions discussed with her.  4)  Weight/Diet:  her Body mass index is 32.59 kg/m.  -  clearly complicating her diabetes care.   she is a candidate for weight loss. I discussed with her the fact that loss of 5 - 10% of her  current body weight will have the most impact on her diabetes management.  Exercise, and detailed carbohydrates information provided  -  detailed on discharge instructions.  5) Chronic Care/Health Maintenance: -she is not on ACEI/ARB and is on Statin medications and is encouraged to initiate and continue to follow up with Ophthalmology, Dentist, Podiatrist at least yearly or according to recommendations, and advised to stay away from smoking (quit over 30 years ago). I have recommended yearly flu vaccine and pneumonia vaccine at least every 5 years; moderate intensity exercise for up to 150 minutes weekly; and sleep for at least 7 hours a day.  - she is advised to maintain close follow up with Lawrance Handing, MD for primary care needs, as well as her other providers for optimal and coordinated care.     I spent  53  minutes in the care of the patient today including review of labs from CMP, Lipids, Thyroid  Function, Hematology (current and previous including abstractions from other facilities); face-to-face time discussing  her blood glucose readings/logs, discussing hypoglycemia and hyperglycemia episodes and symptoms, medications doses, her options of short and long term treatment based on the latest standards of care / guidelines;  discussion about incorporating lifestyle medicine;  and documenting the encounter. Risk reduction counseling performed per USPSTF guidelines to reduce obesity and cardiovascular risk factors.      Please refer to Patient Instructions for Blood Glucose Monitoring and Insulin /Medications Dosing Guide  in media tab for additional information. Please  also refer to  Patient Self Inventory in the Media  tab for reviewed elements of pertinent  patient history.  Kiara [name] participated in the discussions, expressed understanding, and voiced agreement with the above plans.  All questions were answered to her satisfaction. she is encouraged to contact clinic should she have any questions or concerns prior to her return visit.     Follow up plan: - Return in about 3 months (around 06/07/2024) for Diabetes F/U with A1c in office, Bring meter and logs.   Benton Rio, [facility] [address] [place], [place] [address] Phone: [phone] Fax: [phone]  03/07/2024, 11:35 AM

## 2024-03-07 NOTE — Telephone Encounter (Signed)
 Called patient's son per Harlene Gainer, NP with following lab results and instructions:  Her potassium remains elevated. Her phone is not working, please call her sons (# in the chart), and ask if she took her Lokelma .  Son confirms she has taken Lokelma  on the following dates:  02/22/24 for K+ of 5.9 02/25/24 for K+ of 5.5  02/29/24 K+ 5.7 - patient has not taken. Asked son to have her take Lokelma  today.  Confirmed patient is not taking Losartan . Confirmed next appointment in [facility] is next week on 03/14/24.  Above reviewed with Harlene Gainer, NP.

## 2024-03-10 ENCOUNTER — Telehealth: Payer: Self-pay

## 2024-03-10 ENCOUNTER — Other Ambulatory Visit (HOSPITAL_COMMUNITY): Payer: Self-pay

## 2024-03-10 NOTE — Telephone Encounter (Signed)
 Pharmacy Patient Advocate Encounter   Received notification from CoverMyMeds that prior authorization for Omnipod 5 DexG7G6 Pods Gen 5 is required/requested.   Insurance verification completed.   The patient is insured through [employer] .   Per test claim: PA required; PA submitted to above mentioned insurance via Latent Key/confirmation #/EOC [id number] Status is pending

## 2024-03-11 ENCOUNTER — Other Ambulatory Visit (HOSPITAL_COMMUNITY): Payer: Self-pay

## 2024-03-12 NOTE — Progress Notes (Signed)
 [facility] CONSULT NOTE  PCP: Lawrance Handing, MD Primary Cardiologist: Diannah SHAUNNA [name], MD & Dr. Darron HF Cardiologist: Dr. Rolan  HPI: Kiara [name] is a 73 y.o. female with chronic HFrEF, CAD s/p CABG x2 14', DB, HLD, PAD and CKD stage 3a. Followed by Dr. Darron for PAD and Dr. Mallipeddi with Cardiology.    Admitted 7/25 with NSTEMI. Diuretics were held for several days to allow renal recovery prior to [facility]. L/RHC showed patent LIMA to LAD and SVG to PDA with severe native vessel disease. No targets for PCI. RA 19, PA 78/48 (47), PCW 28, PA sat 45%, Fick CO/CI 4.6/2.3. TD CO/CI 3.6/1.8. Required milrinone  gtt. cMRI showed LVEF 34%, RVEF 48%, LGE LAD territory with some viability, nonviable myocardium at mid-apical inferoseptum. Findings suggest ICM. Hospitalization complicated by possible temporal arteritis, treated with steroids and underwent temporal artery bx, which was negative. GDMT titrated. Discharge weight 191 lbs.   She returns today for heart failure follow up with her friend. Overall feeling much better. Reports that she has been getting stronger. No longer having dizziness. NYHA III. Reports fatigue with exertion, takes things slow. Denies chest pain, dyspnea, palpitations, and dizziness. Able to perform ADLs, just takes it slow. Sleeping on 1 pillow. Wears CPAP. Appetite okay. Weight at home 188 lb, stable. BP at home 120/60s. Compliant with all medications.  Cardiac Studies: - L/RHC (7/25): patent LIMA to LAD and SVG to PDA with severe native vessel disease. No targets for PCI. RA 19, PA 78/48 (47), PCW 28, PA sat 45%, CO/CI 4.6/2.3 (Fick), CO/CI 3.6/1.8 (TD).  - Echo (7/25): EF 30%, LV with GHK, mild LVH, RV not well seen, mild MR - [facility] 2019: patent LIMA to LAD and possibly patent SVG to RPDA with normal LVEF    Past Medical History:  Diagnosis Date   Anemia    Anxiety    Arthritis    Carotid artery disease (HCC)    a. Last carotid study in 2014 showed  1-39% RICA, 40-59% LICA.   Chronic combined systolic (congestive) and diastolic (congestive) heart failure (HCC)    a. EF 40-45% in 2014. b. EF 30-35% in 07/2017.   CKD (chronic kidney disease), stage III (HCC)    Coronary artery disease    a. s/p CABG 2014. b. Cath 07/2017 for NSTEMI -> med rx.   Diabetes mellitus (HCC)    Diabetic peripheral neuropathy (HCC)    Eczema    GERD (gastroesophageal reflux disease)    H/O hiatal hernia    Hyperlipidemia    Hypertension    Myocardial infarction (HCC)    PONV (postoperative nausea and vomiting)    Sleep apnea    Umbilical hernia    Current Outpatient Medications  Medication Sig Dispense Refill   acetaminophen  (TYLENOL ) 650 MG CR tablet Take 1,300 mg by mouth every 8 (eight) hours as needed for pain.     albuterol  (VENTOLIN  HFA) 108 (90 Base) MCG/ACT inhaler Inhale 2 puffs into the lungs every 4 (four) hours as needed for shortness of breath or wheezing.     aspirin  EC 81 MG tablet Take 81 mg by mouth daily.      atorvastatin  (LIPITOR ) 80 MG tablet Take 1 tablet (80 mg total) by mouth daily. 30 tablet 0   Cholecalciferol (VITAMIN D ) 2000 units tablet Take 2,000 Units by mouth daily.     Cinnamon 500 MG capsule Take 500 mg by mouth daily.     clopidogrel  (PLAVIX ) 75 MG  tablet Take 1 tablet (75 mg total) by mouth daily. 30 tablet 0   Coenzyme Q10 (COQ10) 100 MG CAPS Take 100 mg by mouth daily.      DULoxetine  (CYMBALTA ) 60 MG capsule Take 60 mg by mouth daily.     ezetimibe  (ZETIA ) 10 MG tablet Take 1 tablet (10 mg total) by mouth daily. 90 tablet 3   fluticasone  (FLONASE ) 50 MCG/ACT nasal spray Place 2 sprays into both nostrils at bedtime as needed for allergies or rhinitis.     furosemide  (LASIX ) 40 MG tablet Take 1 tablet (40 mg total) by mouth daily. 30 tablet 0   gabapentin  (NEURONTIN ) 300 MG capsule Take 1 capsule (300 mg total) by mouth 2 (two) times daily. 60 capsule 0   hydrocortisone  cream 1 % Apply 1 Application topically daily as  needed for itching.     insulin  aspart (NOVOLOG ) 100 UNIT/ML injection Inject 1.75 Units into the skin See admin instructions. 1.25 Units 12 Am-7 Am,  1.75 u/hr 7 AM  Til 12 AM, Bolus 1 unit for every 15 carbs, 1 Unit for every 50 points greater than 150 Insulin  pump     Insulin  Disposable Pump (OMNIPOD 5 DEXG7G6 INTRO GEN 5) KIT Change pod every 48-72 hours 1 kit 0   Insulin  Disposable Pump (OMNIPOD 5 DEXG7G6 PODS GEN 5) MISC Change pod every 48-72 hours 15 each 6   levocetirizine (XYZAL ) 5 MG tablet Take 5 mg by mouth daily.      montelukast (SINGULAIR) 10 MG tablet Take 10 mg by mouth daily.     omeprazole (PRILOSEC) 40 MG capsule Take 40 mg by mouth daily.     prednisoLONE  acetate (PRED FORTE ) 1 % ophthalmic suspension Place 1 drop into the left eye 2 (two) times daily.     sodium chloride  (MURO 128) 5 % ophthalmic solution Place 1 drop into the left eye 2 (two) times daily.     sodium zirconium cyclosilicate  (LOKELMA ) 10 g PACK packet Use 1 packet by mouth daily as needed as directed by the HF Clinic 30 packet 0   Turmeric 500 MG TABS Take 1 tablet by mouth daily.     ofloxacin  (OCUFLOX ) 0.3 % ophthalmic solution Place 2 drops into the left eye 3 (three) times daily. (Patient not taking: Reported on 03/14/2024)     No current facility-administered medications for this encounter.   Allergies  Allergen Reactions   Bactrim [Sulfamethoxazole-Trimethoprim] Rash   Codeine Nausea And Vomiting   Hydromorphone  Nausea Only and Other (See Comments)    Diaphoretic, blood pressure issues, coughing,   Silvadene [Silver Sulfadiazine] Other (See Comments)    Red hot rash and blisters   Adhesive [Tape] Rash    Band aid brand     Mobic [Meloxicam] Swelling   Morphine  And Codeine Nausea Only and Other (See Comments)    Confusion       Social History   Socioeconomic History   Marital status: Widowed    Spouse name: Not on file   Number of children: Not on file   Years of education: Not on file    Highest education level: Not on file  Occupational History   Not on file  Tobacco Use   Smoking status: Former    Current packs/day: 0.00    Types: Cigarettes    Start date: 05/30/1985    Quit date: 05/30/2010    Years since quitting: 13.8    Passive exposure: Never   Smokeless tobacco: Never   Tobacco  comments:    STOPPED 2007  Vaping Use   Vaping status: Never Used  Substance and Sexual Activity   Alcohol use: No   Drug use: No   Sexual activity: Not on file  Other Topics Concern   Not on file  Social History Narrative   Not on file   Social Drivers of Health   Financial Resource Strain: Low Risk  (02/26/2024)   Received from [facility]   Overall Financial Resource Strain (CARDIA)    Difficulty of Paying Living Expenses: Not hard at all  Food Insecurity: No Food Insecurity (02/26/2024)   Received from [facility]   Hunger Vital Sign    Within the past 12 months, you worried that your food would run out before you got the money to buy more.: Never true    Within the past 12 months, the food you bought just didn't last and you didn't have money to get more.: Never true  Transportation Needs: No Transportation Needs (02/26/2024)   Received from [facility] - Transportation    Lack of Transportation (Medical): No    Lack of Transportation (Non-Medical): No  Physical Activity: Inactive (02/26/2024)   Received from [facility]   Exercise Vital Sign    On average, how many days per week do you engage in moderate to strenuous exercise (like a brisk walk)?: 0 days    On average, how many minutes do you engage in exercise at this level?: 0 min  Stress: No Stress Concern Present (02/26/2024)   Received from [facility] - Occupational Stress Questionnaire    Feeling of Stress : Not at all  Social Connections: Moderately Isolated (02/26/2024)   Received from [facility]   Social Connection and  Isolation Panel    In a typical week, how many times do you talk on the phone with family, friends, or neighbors?: More than three times a week    How often do you get together with friends or relatives?: More than three times a week    How often do you attend church or religious services?: More than 4 times per year    Do you belong to any clubs or organizations such as church groups, unions, fraternal or athletic groups, or school groups?: No    How often do you attend meetings of the clubs or organizations you belong to?: Never    Are you married, widowed, divorced, separated, never married, or living with a partner?: Widowed  Intimate Partner Violence: Not At Risk (02/06/2024)   Humiliation, Afraid, Rape, and Kick questionnaire    Fear of Current or Ex-Partner: No    Emotionally Abused: No    Physically Abused: No    Sexually Abused: No   Family History  Problem Relation Age of Onset   Heart attack Mother    Heart disease Mother    Hypertension Mother    Heart attack Brother    Heart disease Brother    Hypertension Brother    Wt Readings from Last 3 Encounters:  03/14/24 83.6 kg (184 lb 6.4 oz)  03/07/24 83.5 kg (184 lb)  02/22/24 83.5 kg (184 lb)   BP 136/60   Pulse 89   Ht 5' 3 (1.6 m)   Wt 83.6 kg (184 lb 6.4 oz)   SpO2 98%   BMI 32.66 kg/m   PHYSICAL EXAM: General: Well appearing. No distress on RA. Arrived by [facility] Cardiac: JVP flat. S1 and  S2 present. No murmurs or rub. Extremities: Warm and dry.  No edema.  Neuro: Alert and oriented x3. Affect pleasant. Moves all extremities without difficulty.  ASSESSMENT & PLAN: 1. Chronic systolic CHF: Ischemic cardiomyopathy. Echo 7/25 showed EF 30%, gHK (prior echo in 2020 with normal EF). 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 Required Milrinone . cMRI showed LVEF 34%, RVEF 48%, LGE LAD territory with some viability, nonviable  myocardium at mid-apical inferoseptum. Findings suggest ischemic cardiomyopathy. - NYHA III. Euvolemic on exam.   - Continue Lasix  40 mg daily.  - Start Digoxin  0.0625 mg daily - Start Toprol -XL 12.5 mg daily - No SGLT2i with type I diabetes.  - No ARB/ARNi or spiro with hyperkalemia. - Repeat echo 3 months. - Ok to start cardiac rehab, have been in touch with her to schedule.  2. CKD stage 3b: Suspect cardiorenal syndrome. Most recent SCr 1.33 - BMET today. 3. CAD: H/o CABG x 2.  NSTEMI at presentation with HS-TnI to 2900.  Cath showed patent LIMA-LAD and SVG-PDA with unchanged native vessels (occluded RCA, occluded OMs). No target for intervention. No chest pain. - Continue ASA 81 + Plavix  75.  - Continue atorvastatin  80 and Zetia  10.  4. PAD: Followed as outpatient by Dr. Darron. No rest pain. - Would keep off pletal  with CHF 5. 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  - Compliant with CPAP 6. Anemia: Hgb stable, low iron  stores. Likely contributing to fatigue. - Repeat iron  studies. Will refer for OP Fe, if remains low. 7. DM I: No SGLT2i.  - Uncontrolled, A1c 10%. - Followed by endocrine. 8. OSA: Compliant with CPAP 9. Hyperkalemia: limiting GDMT. Last K 5.7. BMET today.  Follow up in 3 months with Dr. Rolan + Echo   [place] Natividad Halls, NP 03/14/24

## 2024-03-13 ENCOUNTER — Telehealth (HOSPITAL_COMMUNITY): Payer: Self-pay

## 2024-03-13 NOTE — Telephone Encounter (Signed)
 Called to confirm/remind patient of their appointment at the [facility] on 03/13/24.   Appointment:   [] Confirmed  [x] Left mess   [] No answer/No voice mail  [] VM Full/unable to leave message  [] Phone not in service  And to bring in all medications and/or complete list.

## 2024-03-14 ENCOUNTER — Ambulatory Visit (HOSPITAL_COMMUNITY)
Admission: RE | Admit: 2024-03-14 | Discharge: 2024-03-14 | Disposition: A | Discharge status: Home / Self Care | Source: Ambulatory Visit | Attending: Cardiology | Admitting: Cardiology

## 2024-03-14 ENCOUNTER — Ambulatory Visit (HOSPITAL_COMMUNITY): Payer: Self-pay | Admitting: Cardiology

## 2024-03-14 ENCOUNTER — Encounter (HOSPITAL_COMMUNITY): Payer: Self-pay

## 2024-03-14 VITALS — BP 136/60 | HR 89 | Ht 63.0 in | Wt 184.4 lb

## 2024-03-14 DIAGNOSIS — Z79899 Other long term (current) drug therapy: Secondary | ICD-10-CM | POA: Diagnosis not present

## 2024-03-14 DIAGNOSIS — N183 Chronic kidney disease, stage 3 unspecified: Secondary | ICD-10-CM

## 2024-03-14 DIAGNOSIS — Z8249 Family history of ischemic heart disease and other diseases of the circulatory system: Secondary | ICD-10-CM | POA: Insufficient documentation

## 2024-03-14 DIAGNOSIS — E1065 Type 1 diabetes mellitus with hyperglycemia: Secondary | ICD-10-CM | POA: Insufficient documentation

## 2024-03-14 DIAGNOSIS — E875 Hyperkalemia: Secondary | ICD-10-CM | POA: Insufficient documentation

## 2024-03-14 DIAGNOSIS — Z87891 Personal history of nicotine dependence: Secondary | ICD-10-CM | POA: Insufficient documentation

## 2024-03-14 DIAGNOSIS — N1831 Chronic kidney disease, stage 3a: Secondary | ICD-10-CM | POA: Insufficient documentation

## 2024-03-14 DIAGNOSIS — N1832 Chronic kidney disease, stage 3b: Secondary | ICD-10-CM | POA: Diagnosis not present

## 2024-03-14 DIAGNOSIS — I130 Hypertensive heart and chronic kidney disease with heart failure and stage 1 through stage 4 chronic kidney disease, or unspecified chronic kidney disease: Secondary | ICD-10-CM | POA: Diagnosis not present

## 2024-03-14 DIAGNOSIS — G4733 Obstructive sleep apnea (adult) (pediatric): Secondary | ICD-10-CM | POA: Insufficient documentation

## 2024-03-14 DIAGNOSIS — Z951 Presence of aortocoronary bypass graft: Secondary | ICD-10-CM | POA: Insufficient documentation

## 2024-03-14 DIAGNOSIS — I5022 Chronic systolic (congestive) heart failure: Secondary | ICD-10-CM | POA: Diagnosis present

## 2024-03-14 DIAGNOSIS — E1022 Type 1 diabetes mellitus with diabetic chronic kidney disease: Secondary | ICD-10-CM | POA: Diagnosis not present

## 2024-03-14 DIAGNOSIS — I272 Pulmonary hypertension, unspecified: Secondary | ICD-10-CM | POA: Diagnosis not present

## 2024-03-14 DIAGNOSIS — Z794 Long term (current) use of insulin: Secondary | ICD-10-CM | POA: Insufficient documentation

## 2024-03-14 DIAGNOSIS — D649 Anemia, unspecified: Secondary | ICD-10-CM | POA: Diagnosis not present

## 2024-03-14 DIAGNOSIS — I2721 Secondary pulmonary arterial hypertension: Secondary | ICD-10-CM | POA: Insufficient documentation

## 2024-03-14 DIAGNOSIS — I25810 Atherosclerosis of coronary artery bypass graft(s) without angina pectoris: Secondary | ICD-10-CM | POA: Diagnosis not present

## 2024-03-14 DIAGNOSIS — I252 Old myocardial infarction: Secondary | ICD-10-CM | POA: Diagnosis not present

## 2024-03-14 DIAGNOSIS — Z7902 Long term (current) use of antithrombotics/antiplatelets: Secondary | ICD-10-CM | POA: Diagnosis not present

## 2024-03-14 DIAGNOSIS — E1051 Type 1 diabetes mellitus with diabetic peripheral angiopathy without gangrene: Secondary | ICD-10-CM | POA: Diagnosis not present

## 2024-03-14 DIAGNOSIS — Z7982 Long term (current) use of aspirin: Secondary | ICD-10-CM | POA: Insufficient documentation

## 2024-03-14 DIAGNOSIS — I255 Ischemic cardiomyopathy: Secondary | ICD-10-CM | POA: Diagnosis not present

## 2024-03-14 DIAGNOSIS — I2510 Atherosclerotic heart disease of native coronary artery without angina pectoris: Secondary | ICD-10-CM | POA: Insufficient documentation

## 2024-03-14 LAB — IRON AND TIBC
Iron: 62 ug/dL (ref 28–170)
Saturation Ratios: 23 % (ref 10.4–31.8)
TIBC: 274 ug/dL (ref 250–450)
UIBC: 212 ug/dL

## 2024-03-14 LAB — BASIC METABOLIC PANEL WITH GFR
Anion gap: 10 (ref 5–15)
BUN: 35 mg/dL — ABNORMAL HIGH (ref 8–23)
CO2: 27 mmol/L (ref 22–32)
Calcium: 9.4 mg/dL (ref 8.9–10.3)
Chloride: 95 mmol/L — ABNORMAL LOW (ref 98–111)
Creatinine, Ser: 1.25 mg/dL — ABNORMAL HIGH (ref 0.44–1.00)
GFR, Estimated: 46 mL/min — ABNORMAL LOW (ref >60)
Glucose, Bld: 491 mg/dL — ABNORMAL HIGH (ref 70–99)
Potassium: 5.1 mmol/L (ref 3.5–5.1)
Sodium: 132 mmol/L — ABNORMAL LOW (ref 135–145)

## 2024-03-14 LAB — FERRITIN: Ferritin: 57 ng/mL (ref 11–307)

## 2024-03-14 LAB — BRAIN NATRIURETIC PEPTIDE: B Natriuretic Peptide: 177 pg/mL — ABNORMAL HIGH (ref 0.0–100.0)

## 2024-03-14 MED ORDER — METOPROLOL SUCCINATE ER 25 MG PO TB24: 12.5000 mg | ORAL_TABLET | Freq: Every day | ORAL | 3 refills | Status: DC

## 2024-03-14 MED ORDER — DIGOXIN 125 MCG PO TABS: 0.0625 mg | ORAL_TABLET | Freq: Every day | ORAL | 3 refills | Status: DC

## 2024-03-14 NOTE — Patient Instructions (Addendum)
 Thank you for coming in today  If you had labs drawn today, any labs that are abnormal the clinic will call you No news is good news  Medications: Start Toprol  XL 12.5 mg daily Start Digoxin  0.0625 mg   Follow up appointments:  Your physician recommends that you schedule a follow-up appointment in:  1-2 months With Dr. Rolan with echocardiogram Please call our office to schedule the follow-up appointment in September for October 2025.   Your physician has requested that you have an echocardiogram. Echocardiography is a painless test that uses sound waves to create images of your heart. It provides your doctor with information about the size and shape of your heart and how well your heart's chambers and valves are working. This procedure takes approximately one hour. There are no restrictions for this procedure.      Do the following things EVERYDAY: Weigh yourself in the morning before breakfast. Write it down and keep it in a log. Take your medicines as prescribed Eat low salt foods--Limit salt (sodium) to 2000 mg per day.  Stay as active as you can everyday Limit all fluids for the day to less than 2 liters   At the [facility], you and your health needs are our priority. As part of our continuing mission to provide you with exceptional heart care, we have created designated Provider Care Teams. These Care Teams include your primary Cardiologist (physician) and Advanced Practice Providers (APPs- Physician Assistants and Nurse Practitioners) who all work together to provide you with the care you need, when you need it.   You may see any of the following providers on your designated Care Team at your next follow up: Dr Toribio Fuel Dr Ezra Rolan Dr. Ria Gardenia Greig Lenetta, NP Caffie Shed, [place] [facility] [place], [place] Beckey Coe, NP Tinnie Redman, PharmD   Please be sure to bring in all your medications bottles to every appointment.     Thank you for choosing  [facility]  If you have any questions or concerns before your next appointment please send us  a message through [place] or call our office at [phone].    TO LEAVE A MESSAGE FOR THE NURSE SELECT OPTION 2, PLEASE LEAVE A MESSAGE INCLUDING: YOUR NAME DATE OF BIRTH CALL BACK NUMBER REASON FOR CALL**this is important as we prioritize the call backs  YOU WILL RECEIVE A CALL BACK THE SAME DAY AS LONG AS YOU CALL BEFORE 4:00 PM

## 2024-03-24 NOTE — Telephone Encounter (Signed)
 Pharmacy Patient Advocate Encounter  Received notification from [facility] that Prior Authorization for Omnipod 5 DexG7G6 Pods Gen 5  has been DENIED.  Full denial letter will be uploaded to the media tab. See denial reason below.    It does not appear that they will cover Omnipod. May need an appeal with explanation as the covered alternatives are not insulin  pumps

## 2024-04-01 ENCOUNTER — Encounter: Payer: Self-pay | Admitting: Cardiovascular Disease

## 2024-04-01 ENCOUNTER — Ambulatory Visit: Attending: Cardiovascular Disease | Admitting: Cardiovascular Disease

## 2024-04-01 VITALS — BP 146/82 | HR 80 | Ht 63.0 in | Wt 188.4 lb

## 2024-04-01 DIAGNOSIS — I6523 Occlusion and stenosis of bilateral carotid arteries: Secondary | ICD-10-CM

## 2024-04-01 DIAGNOSIS — I25810 Atherosclerosis of coronary artery bypass graft(s) without angina pectoris: Secondary | ICD-10-CM | POA: Diagnosis not present

## 2024-04-01 DIAGNOSIS — I739 Peripheral vascular disease, unspecified: Secondary | ICD-10-CM | POA: Diagnosis not present

## 2024-04-01 DIAGNOSIS — E785 Hyperlipidemia, unspecified: Secondary | ICD-10-CM

## 2024-04-01 DIAGNOSIS — I5022 Chronic systolic (congestive) heart failure: Secondary | ICD-10-CM

## 2024-04-01 NOTE — Patient Instructions (Signed)
 Medication Instructions:  No changes *If you need a refill on your cardiac medications before your next appointment, please call your pharmacy*  Lab Work: None ordered If you have labs (blood work) drawn today and your tests are completely normal, you will receive your results only by: MyChart Message (if you have MyChart) OR A paper copy in the mail If you have any lab test that is abnormal or we need to change your treatment, we will call you to review the results.  Testing/Procedures: None ordered  Follow-Up: At [facility], you and your health needs are our priority.  As part of our continuing mission to provide you with exceptional heart care, our providers are all part of one team.  This team includes your primary Cardiologist (physician) and Advanced Practice Providers or APPs (Physician Assistants and Nurse Practitioners) who all work together to provide you with the care you need, when you need it.  Your next appointment:   4 month(s)  Provider:   Dr. Arida

## 2024-04-01 NOTE — Progress Notes (Signed)
 Cardiology Office Note   Date:  04/01/2024   ID:  Kiara [name], DOB [birth year], MRN [id number]  PCP:  Lawrance Handing, MD  Cardiologist: Dr. Stacia  No chief complaint on file.     History of Present Illness: Kiara [name] is a 73 y.o. female who is here today for follow-up visit regarding peripheral arterial disease . She has known history of coronary artery disease status post CABG, chronic systolic heart failure, diabetes mellitus, hyperlipidemia and diabetes mellitus.  She is been diabetic for 60 years.  She had amputation of the right lateral toes in 2006.  She also has lumbar spine disease.  She was seen in 2019 for severe bilateral leg claudication. Vascular studies showed an ABI of 0.92 on the right and 0.93 on the left.     Duplex showed moderate bilateral common femoral artery and SFA disease. CO2 angiography was performed in August 2019 which showed mild disease affecting the right common femoral and SFA disease with three-vessel runoff below the knee.  In the left, there was moderate calcified common iliac artery stenosis and moderate distal SFA disease.  The disease was not significant enough to explain severity of her symptoms.  She had worsening left leg claudication in 2024.   Angiography in October 2024 showed severe heavily calcified eccentric stenosis in the left common iliac artery.  This was treated successfully with intravascular lithotripsy and self-expanding stent placement. Most recent Doppler studies in April showed an ABI of 0.69 on the right and 0.85 on the left.  Duplex showed evidence of severe right distal external iliac artery stenosis with peak velocity of 555.  Left common iliac stent was patent.  She was hospitalized in July with non-STEMI and acute on chronic systolic heart failure.  Echocardiogram  showed an EF of 30% with global hypokinesis.  Cardiac catheterization was performed which showed severe three-vessel coronary artery disease with  patent grafts.  Right heart catheterization showed severe pulmonary hypertension in the setting of moderately elevated wedge pressure.  She was suspected of having temporal lobe arthritis and had biopsy done which was negative.  She was placed on prednisone  which worsened her glycemic control.  She has been feeling better since she was discharged.  She has less shortness of breath and no significant lower extremity edema.  Her mobility has been somewhat limited.  She has arthritis and does complain of right leg claudication.   Past Medical History:  Diagnosis Date   Anemia    Anxiety    Arthritis    Carotid artery disease (HCC)    a. Last carotid study in 2014 showed 1-39% RICA, 40-59% LICA.   Chronic combined systolic (congestive) and diastolic (congestive) heart failure (HCC)    a. EF 40-45% in 2014. b. EF 30-35% in 07/2017.   CKD (chronic kidney disease), stage III (HCC)    Coronary artery disease    a. s/p CABG 2014. b. Cath 07/2017 for NSTEMI -> med rx.   Diabetes mellitus (HCC)    Diabetic peripheral neuropathy (HCC)    Eczema    GERD (gastroesophageal reflux disease)    H/O hiatal hernia    Hyperlipidemia    Hypertension    Myocardial infarction (HCC)    PONV (postoperative nausea and vomiting)    Sleep apnea    Umbilical hernia     Past Surgical History:  Procedure Laterality Date   ABDOMINAL AORTOGRAM W/LOWER EXTREMITY N/A 03/06/2018   Procedure: ABDOMINAL AORTOGRAM W/LOWER EXTREMITY;  Surgeon: Darron,  Deatrice LABOR, MD;  Location: [facility];  Service: Cardiovascular;  Laterality: N/A;   ABDOMINAL AORTOGRAM W/LOWER EXTREMITY N/A 05/16/2023   Procedure: ABDOMINAL AORTOGRAM W/LOWER EXTREMITY;  Surgeon: Darron Deatrice LABOR, MD;  Location: [facility];  Service: Cardiovascular;  Laterality: N/A;   ABDOMINAL HYSTERECTOMY     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     ARTERY BIOPSY Right 02/13/2024   Procedure: BIOPSY TEMPORAL ARTERY;  Surgeon: Magda Debby SAILOR, MD;   Location: [facility] OR;  Service: Vascular;  Laterality: Right;   CARDIAC CATHETERIZATION     CARPAL TUNNEL RELEASE     CESAREAN SECTION  1983   CORONARY ARTERY BYPASS GRAFT N/A 04/11/2013   Procedure: CORONARY ARTERY BYPASS GRAFTING (CABG);  Surgeon: Dorise MARLA Fellers, MD;  Location[facility];  Service: Open Heart Surgery;  Laterality: N/A;   EYE SURGERY Bilateral    laser   FRACTURE SURGERY Left 2005   PERIPHERAL INTRAVASCULAR LITHOTRIPSY  05/16/2023   Procedure: PERIPHERAL INTRAVASCULAR LITHOTRIPSY;  Surgeon: Darron Deatrice LABOR, MD;  Location: [facility];  Service: Cardiovascular;;   PERIPHERAL VASCULAR INTERVENTION  05/16/2023   Procedure: PERIPHERAL VASCULAR INTERVENTION;  Surgeon: Darron Deatrice LABOR, MD;  Location: [facility];  Service: Cardiovascular;;   RIGHT HEART CATH AND CORONARY/GRAFT ANGIOGRAPHY N/A 02/08/2024   Procedure: RIGHT HEART CATH AND CORONARY/GRAFT ANGIOGRAPHY;  Surgeon: Elmira Newman PARAS, MD;  Location: [facility];  Service: Cardiovascular;  Laterality: N/A;   RIGHT/LEFT HEART CATH AND CORONARY/GRAFT ANGIOGRAPHY N/A 08/06/2017   Procedure: RIGHT/LEFT HEART CATH AND CORONARY/GRAFT ANGIOGRAPHY;  Surgeon: Mady Bruckner, MD;  Location: [facility];  Service: Cardiovascular;  Laterality: N/A;     Current Outpatient Medications  Medication Sig Dispense Refill   acetaminophen  (TYLENOL ) 650 MG CR tablet Take 1,300 mg by mouth every 8 (eight) hours as needed for pain.     albuterol  (VENTOLIN  HFA) 108 (90 Base) MCG/ACT inhaler Inhale 2 puffs into the lungs every 4 (four) hours as needed for shortness of breath or wheezing.     aspirin  EC 81 MG tablet Take 81 mg by mouth daily.      atorvastatin  (LIPITOR ) 80 MG tablet Take 1 tablet (80 mg total) by mouth daily. 30 tablet 0   Cholecalciferol (VITAMIN D ) 2000 units tablet Take 2,000 Units by mouth daily.     Cinnamon 500 MG capsule Take 500 mg by mouth daily.     Coenzyme Q10 (COQ10) 100 MG CAPS Take 100 mg by  mouth daily.      digoxin  (LANOXIN ) 0.125 MG tablet Take 0.5 tablets (0.0625 mg total) by mouth daily. 45 tablet 3   DULoxetine  (CYMBALTA ) 60 MG capsule Take 60 mg by mouth daily.     ezetimibe  (ZETIA ) 10 MG tablet Take 1 tablet (10 mg total) by mouth daily. 90 tablet 3   fluticasone  (FLONASE ) 50 MCG/ACT nasal spray Place 2 sprays into both nostrils at bedtime as needed for allergies or rhinitis.     furosemide  (LASIX ) 40 MG tablet Take 1 tablet (40 mg total) by mouth daily. 30 tablet 0   gabapentin  (NEURONTIN ) 300 MG capsule Take 1 capsule (300 mg total) by mouth 2 (two) times daily. 60 capsule 0   hydrocortisone  cream 1 % Apply 1 Application topically daily as needed for itching.     insulin  aspart (NOVOLOG ) 100 UNIT/ML injection Inject 1.75 Units into the skin See admin instructions. 1.25 Units 12 Am-7 Am,  1.75 u/hr 7  AM  Til 12 AM, Bolus 1 unit for every 15 carbs, 1 Unit for every 50 points greater than 150 Insulin  pump     Insulin  Disposable Pump (OMNIPOD 5 DEXG7G6 INTRO GEN 5) KIT Change pod every 48-72 hours 1 kit 0   Insulin  Disposable Pump (OMNIPOD 5 DEXG7G6 PODS GEN 5) MISC Change pod every 48-72 hours 15 each 6   levocetirizine (XYZAL ) 5 MG tablet Take 5 mg by mouth daily.      metoprolol  succinate (TOPROL  XL) 25 MG 24 hr tablet Take 0.5 tablets (12.5 mg total) by mouth daily. 45 tablet 3   montelukast (SINGULAIR) 10 MG tablet Take 10 mg by mouth daily.     omeprazole (PRILOSEC) 40 MG capsule Take 40 mg by mouth daily.     prednisoLONE  acetate (PRED FORTE ) 1 % ophthalmic suspension Place 1 drop into the left eye 2 (two) times daily.     sodium chloride  (MURO 128) 5 % ophthalmic solution Place 1 drop into the left eye 2 (two) times daily.     sodium zirconium cyclosilicate  (LOKELMA ) 10 g PACK packet Use 1 packet by mouth daily as needed as directed by the HF Clinic 30 packet 0   Turmeric 500 MG TABS Take 1 tablet by mouth daily.     ofloxacin  (OCUFLOX ) 0.3 % ophthalmic solution Place 2  drops into the left eye 3 (three) times daily. (Patient not taking: Reported on 04/01/2024)     No current facility-administered medications for this visit.    Allergies:   Bactrim [sulfamethoxazole-trimethoprim], Codeine, Hydromorphone , Silvadene [silver sulfadiazine], Adhesive [tape], Mobic [meloxicam], and Morphine  and codeine    Social History:  The patient  reports that she quit smoking about 13 years ago. Her smoking use included cigarettes. She started smoking about 38 years ago. She has never been exposed to tobacco smoke. She has never used smokeless tobacco. She reports that she does not drink alcohol and does not use drugs.   Family History:  The patient's family history includes Heart attack in her brother and mother; Heart disease in her brother and mother; Hypertension in her brother and mother.    ROS:  Please see the history of present illness.   Otherwise, review of systems are positive for none.   All other systems are reviewed and negative.    PHYSICAL EXAM: VS:  BP (!) 146/82 (BP Location: Left Arm, Patient Position: Sitting)   Pulse 80   Ht 5' 3 (1.6 m)   Wt 188 lb 6.4 oz (85.5 kg)   SpO2 98%   BMI 33.37 kg/m  , BMI Body mass index is 33.37 kg/m. GEN: Well nourished, well developed, in no acute distress  HEENT: normal  Neck: no JVD, carotid bruits, or masses Cardiac: RRR; no murmurs, rubs, or gallops,no edema  Respiratory:  clear to auscultation bilaterally, normal work of breathing GI: soft, nontender, nondistended, + BS MS: no deformity or atrophy  Skin: warm and dry, no rash Neuro:  Strength and sensation are intact Psych: euthymic mood, full affect    EKG:  EKG is not ordered today.    Recent Labs: 02/13/2024: Magnesium  2.5 02/14/2024: Hemoglobin 10.8; Platelets 495 03/14/2024: B Natriuretic Peptide 177.0; BUN 35; Creatinine, Ser 1.25; Potassium 5.1; Sodium 132    Lipid Panel    Component Value Date/Time   CHOL 77 02/07/2024 0325   TRIG 102  02/07/2024 0325   HDL 29 (L) 02/07/2024 0325   CHOLHDL 2.7 02/07/2024 0325   VLDL 20 02/07/2024  0325   LDLCALC 28 02/07/2024 0325      Wt Readings from Last 3 Encounters:  04/01/24 188 lb 6.4 oz (85.5 kg)  03/14/24 184 lb 6.4 oz (83.6 kg)  03/07/24 184 lb (83.5 kg)           No data to display            ASSESSMENT AND PLAN:  1.  Peripheral arterial disease: Status post left common iliac artery stent placement.  She has evidence of severe stenosis in the right external iliac artery with claudication but she is limited by other issues as well.  She had a prolonged hospitalization in July for acute on chronic systolic heart failure and has been slowly recovering.  She is going to attend cardiac rehab and we will see how she does with that.  If leg claudication becomes the main issue, it would make sense to proceed with angiography and endovascular intervention.  For now, she has not been active enough to notice significant lifestyle limiting symptoms.   I agree with stopping cilostazol  and not resuming it given her heart failure symptoms.  She might benefit from small dose Xarelto but she does have underlying anemia.  2.  Coronary artery disease involving native coronary arteries: Symptoms are well controlled at the present time.  Recent cardiac catheterization showed patent grafts.  3.  Chronic systolic heart failure: She appears to be euvolemic currently on optimal medical therapy.  4.  Hyperlipidemia: Currently on rosuvastatin  and Zetia .  Most recent lipid profile in January showed an LDL of 49.  5.  Carotid disease: This was mild on most recent carotid Doppler in 2023.   Disposition:   Follow-up with me in 4 months.  Signed,  Deatrice Cage, MD  04/01/2024 9:34 AM    [facility]

## 2024-04-03 ENCOUNTER — Telehealth (HOSPITAL_COMMUNITY): Payer: Self-pay | Admitting: Cardiology

## 2024-04-03 ENCOUNTER — Telehealth: Payer: Self-pay | Admitting: Pharmacist

## 2024-04-03 NOTE — Telephone Encounter (Signed)
 E-Appeal has been submitted. Will advise when response is received, please be advised that most companies may take 30 days to make a decision. Appeal letter and supporting documentation have been uploaded and submitted via [employer] Website on 04/03/2024.  Thank you, Devere Pandy, PharmD Clinical Pharmacist  [place]  Direct Dial: [phone]

## 2024-04-08 NOTE — Telephone Encounter (Signed)
 Appeal has been approved by the insurance:

## 2024-04-08 NOTE — Telephone Encounter (Signed)
 Can you reach out and let the patient know the appeal for Omnipod was overturned?  She should be able to proceed with the prescription now.

## 2024-04-09 NOTE — Telephone Encounter (Signed)
 Patient was called to be made aware of where we were in the process of getting an approval.  She shares that she had received a letter from [facility] on yesterday and was told that she had been approved. She is going to re read the letter and let us  know if anything is different.

## 2024-04-10 NOTE — Telephone Encounter (Signed)
 Patient was called and made aware. Prescription was sent to [facility], number provided for patient.  She will reach out to them, if there are any issues she will contact our office back.

## 2024-04-10 NOTE — Telephone Encounter (Signed)
 Per [employer] and notes, patient's appeal was overturned. Patient was called and made aware.

## 2024-04-11 ENCOUNTER — Telehealth: Payer: Self-pay | Admitting: *Deleted

## 2024-04-11 NOTE — Telephone Encounter (Signed)
 Patient had left a message for a call back about her Omnipod. She was calling her insurance about this. She had ask for a call back. Returned the call and I left another message for her.

## 2024-05-02 ENCOUNTER — Other Ambulatory Visit: Payer: Self-pay | Admitting: Internal Medicine

## 2024-05-19 ENCOUNTER — Encounter: Payer: Self-pay | Admitting: Nurse Practitioner

## 2024-05-19 ENCOUNTER — Other Ambulatory Visit: Payer: Self-pay | Admitting: Nurse Practitioner

## 2024-05-19 MED ORDER — INSULIN ASPART 100 UNIT/ML IJ SOLN: INTRAMUSCULAR | 3 refills | Status: DC

## 2024-05-19 MED ORDER — INSULIN ASPART 100 UNIT/ML IJ SOLN: INTRAMUSCULAR | 3 refills | Status: AC

## 2024-06-05 ENCOUNTER — Encounter (HOSPITAL_COMMUNITY): Payer: Self-pay | Admitting: Cardiology

## 2024-06-05 ENCOUNTER — Ambulatory Visit (HOSPITAL_BASED_OUTPATIENT_CLINIC_OR_DEPARTMENT_OTHER)
Admission: RE | Admit: 2024-06-05 | Discharge: 2024-06-05 | Disposition: A | Discharge status: Home / Self Care | Source: Ambulatory Visit | Attending: Cardiology | Admitting: Cardiology

## 2024-06-05 ENCOUNTER — Ambulatory Visit (HOSPITAL_COMMUNITY)
Admission: RE | Admit: 2024-06-05 | Discharge: 2024-06-05 | Disposition: A | Discharge status: Home / Self Care | Source: Ambulatory Visit | Attending: Family Medicine | Admitting: Family Medicine

## 2024-06-05 ENCOUNTER — Ambulatory Visit (HOSPITAL_COMMUNITY): Payer: Self-pay | Admitting: Cardiology

## 2024-06-05 ENCOUNTER — Ambulatory Visit (HOSPITAL_COMMUNITY)
Admission: RE | Admit: 2024-06-05 | Discharge: 2024-06-05 | Disposition: A | Discharge status: Home / Self Care | Source: Ambulatory Visit | Attending: Cardiology | Admitting: Cardiology

## 2024-06-05 ENCOUNTER — Other Ambulatory Visit (HOSPITAL_COMMUNITY): Payer: Self-pay | Admitting: Cardiology

## 2024-06-05 VITALS — BP 110/60 | HR 72 | Ht 63.0 in | Wt 182.2 lb

## 2024-06-05 DIAGNOSIS — I5022 Chronic systolic (congestive) heart failure: Secondary | ICD-10-CM | POA: Diagnosis not present

## 2024-06-05 DIAGNOSIS — Z951 Presence of aortocoronary bypass graft: Secondary | ICD-10-CM | POA: Diagnosis not present

## 2024-06-05 DIAGNOSIS — R002 Palpitations: Secondary | ICD-10-CM

## 2024-06-05 DIAGNOSIS — Z7902 Long term (current) use of antithrombotics/antiplatelets: Secondary | ICD-10-CM | POA: Insufficient documentation

## 2024-06-05 DIAGNOSIS — G4733 Obstructive sleep apnea (adult) (pediatric): Secondary | ICD-10-CM | POA: Diagnosis not present

## 2024-06-05 DIAGNOSIS — E1051 Type 1 diabetes mellitus with diabetic peripheral angiopathy without gangrene: Secondary | ICD-10-CM | POA: Diagnosis not present

## 2024-06-05 DIAGNOSIS — Z7982 Long term (current) use of aspirin: Secondary | ICD-10-CM | POA: Insufficient documentation

## 2024-06-05 DIAGNOSIS — I25810 Atherosclerosis of coronary artery bypass graft(s) without angina pectoris: Secondary | ICD-10-CM

## 2024-06-05 DIAGNOSIS — I272 Pulmonary hypertension, unspecified: Secondary | ICD-10-CM | POA: Insufficient documentation

## 2024-06-05 DIAGNOSIS — I5032 Chronic diastolic (congestive) heart failure: Secondary | ICD-10-CM

## 2024-06-05 DIAGNOSIS — N1832 Chronic kidney disease, stage 3b: Secondary | ICD-10-CM | POA: Diagnosis not present

## 2024-06-05 DIAGNOSIS — I255 Ischemic cardiomyopathy: Secondary | ICD-10-CM | POA: Insufficient documentation

## 2024-06-05 DIAGNOSIS — I252 Old myocardial infarction: Secondary | ICD-10-CM | POA: Insufficient documentation

## 2024-06-05 DIAGNOSIS — Z9641 Presence of insulin pump (external) (internal): Secondary | ICD-10-CM | POA: Insufficient documentation

## 2024-06-05 DIAGNOSIS — R5383 Other fatigue: Secondary | ICD-10-CM | POA: Diagnosis not present

## 2024-06-05 DIAGNOSIS — E1022 Type 1 diabetes mellitus with diabetic chronic kidney disease: Secondary | ICD-10-CM | POA: Diagnosis not present

## 2024-06-05 DIAGNOSIS — E785 Hyperlipidemia, unspecified: Secondary | ICD-10-CM

## 2024-06-05 DIAGNOSIS — E875 Hyperkalemia: Secondary | ICD-10-CM | POA: Diagnosis not present

## 2024-06-05 DIAGNOSIS — Z79899 Other long term (current) drug therapy: Secondary | ICD-10-CM | POA: Insufficient documentation

## 2024-06-05 DIAGNOSIS — I2510 Atherosclerotic heart disease of native coronary artery without angina pectoris: Secondary | ICD-10-CM | POA: Diagnosis not present

## 2024-06-05 DIAGNOSIS — I739 Peripheral vascular disease, unspecified: Secondary | ICD-10-CM

## 2024-06-05 LAB — BASIC METABOLIC PANEL WITH GFR
Anion gap: 10 (ref 5–15)
BUN: 24 mg/dL — ABNORMAL HIGH (ref 8–23)
CO2: 29 mmol/L (ref 22–32)
Calcium: 9.2 mg/dL (ref 8.9–10.3)
Chloride: 98 mmol/L (ref 98–111)
Creatinine, Ser: 1.14 mg/dL — ABNORMAL HIGH (ref 0.44–1.00)
GFR, Estimated: 51 mL/min — ABNORMAL LOW (ref >60)
Glucose, Bld: 315 mg/dL — ABNORMAL HIGH (ref 70–99)
Potassium: 4.8 mmol/L (ref 3.5–5.1)
Sodium: 137 mmol/L (ref 135–145)

## 2024-06-05 LAB — BRAIN NATRIURETIC PEPTIDE: B Natriuretic Peptide: 188.2 pg/mL — ABNORMAL HIGH (ref 0.0–100.0)

## 2024-06-05 MED ORDER — PERFLUTREN LIPID MICROSPHERE
1.0000 mL | INTRAVENOUS | Status: DC | PRN
  Administered 2024-06-05: 2 mL via INTRAVENOUS
  Filled 2024-06-05: qty 10

## 2024-06-05 MED ORDER — METOPROLOL SUCCINATE ER 25 MG PO TB24
25.0000 mg | ORAL_TABLET | Freq: Every day | ORAL | 3 refills | Status: AC
Start: 2024-06-05 — End: ?

## 2024-06-05 NOTE — Progress Notes (Signed)
 Zio patch placed onto patient.  All instructions and information reviewed with patient, they verbalize understanding with no questions.

## 2024-06-05 NOTE — Patient Instructions (Signed)
 STOP Digoxin .  CHANGE Toprol  XL to 25 mg daily.  Labs done today, your results will be available in MyChart, we will contact you for abnormal readings.  You have been referred to the lipid clinic. They will call you to arrange your appointment.  You have been referred to [facility]. They will call you to arrange your appointment.  Your physician recommends that you schedule a follow-up appointment in: 3 months.  If you have any questions or concerns before your next appointment please send us  a message through [place] or call our office at [phone].    TO LEAVE A MESSAGE FOR THE NURSE SELECT OPTION 2, PLEASE LEAVE A MESSAGE INCLUDING: YOUR NAME DATE OF BIRTH CALL BACK NUMBER REASON FOR CALL**this is important as we prioritize the call backs  YOU WILL RECEIVE A CALL BACK THE SAME DAY AS LONG AS YOU CALL BEFORE 4:00 PM  At the [facility], you and your health needs are our priority. As part of our continuing mission to provide you with exceptional heart care, we have created designated Provider Care Teams. These Care Teams include your primary Cardiologist (physician) and Advanced Practice Providers (APPs- Physician Assistants and Nurse Practitioners) who all work together to provide you with the care you need, when you need it.   You may see any of the following providers on your designated Care Team at your next follow up: Dr Toribio Fuel Dr Ezra Shuck Dr. Morene Brownie Greig Mosses, NP Caffie Shed, [place] [facility] Piedra Aguza, [place] Beckey Coe, NP Jordan Lee, NP Ellouise Class, NP Tinnie Redman, PharmD Jaun Bash, PharmD   Please be sure to bring in all your medications bottles to every appointment.    Thank you for choosing [facility]

## 2024-06-06 ENCOUNTER — Encounter (HOSPITAL_COMMUNITY): Payer: Self-pay

## 2024-06-06 LAB — ECHOCARDIOGRAM COMPLETE
AR max vel: 1.89 cm2
AV Area VTI: 1.96 cm2
AV Area mean vel: 1.72 cm2
AV Mean grad: 3 mmHg
AV Peak grad: 4.8 mmHg
Ao pk vel: 1.1 m/s
Area-P 1/2: 3.12 cm2
Calc EF: 51.7 %
Est EF: 50
MV VTI: 1.29 cm2
S' Lateral: 3.2 cm
Single Plane A2C EF: 56.8 %
Single Plane A4C EF: 47.2 %

## 2024-06-06 NOTE — Progress Notes (Addendum)
 [facility] CONSULT NOTE  PCP: Lawrance Handing, MD Primary Cardiologist: Diannah SHAUNNA [name], MD  HF Cardiologist: Dr. Rolan  Chief complaint: CHF  HPI: Kiara [name] is a 73 y.o. female with chronic HFrEF, CAD s/p CABG x2 in 2014, DM1, HLD, PAD and CKD stage 3a.    Admitted 7/25 with NSTEMI. Diuretics were held for several days to allow renal recovery prior to [facility]. L/RHC showed patent LIMA to LAD and SVG to PDA with severe native vessel disease. No targets for PCI. RA 19, PA 78/48 (47), PCW 28, PA sat 45%, Fick CO/CI 4.6/2.3. TD CO/CI 3.6/1.8. Required milrinone  gtt. cMRI showed LVEF 34%, RVEF 48%, LGE LAD territory with some viability, nonviable myocardium at mid-apical inferoseptum. Findings suggest ischemic cardiomyopathy. Hospitalization complicated by possible temporal arteritis, treated with steroids and underwent temporal artery biopsy which was negative. GDMT titrated. Discharge weight 191 lbs.   Echo was done today and reviewed, EF 50-55% with mild LVH, mild RV dysfunction/normal RV size, IVC not dilated.   CKD stage 3 has been complicated by chronic hyperkalemia. She is not currently taking Lokelma .   Patient returns for followup of CHF.  She sleeps a lot and fatigues easily.  She is using her CPAP nightly.  Follows with endocrinology for type 1 diabetes. No significant exertional dyspnea.  Main complaint is that her legs give out.  Right leg is worse. There is no pain but calf will get tight and weak with prolonged walking. She has to slow down.  No pedal ulcerations or rest symptoms. No chest pain.  She has episodes 2-3 times/week where she will feel severe fatigue that will last for 10-20 minutes.  She feels like these are hypoglycemic episodes, but when she checks her blood glucose, it is not low.  She will get diaphoresis and palpitations with these episodes.   ECG (personally reviewed): NSR, lateral TWIs  Labs (10/25): LDL 74, TGs 170, K 5.5, creatinine  1.49  PMH: 1. Type 1 diabetes 2. CKD stage 3 with hyperkalemia. 3. OSA: Uses CPAP.  4. PAD: Aortoiliac dopplers in 4/25 with severe stenosis distal right external iliac artery.  5. CAD: s/p CABG 2014.  - NSTEMI 7/25, cath showed occluded RCA, mid LAD, OM1 and OM2.  SVG-PDA patent, LIMA-LAD patent. No target for intervention.  6. Chronic systolic CHF: Ischemic cardiomyopathy.  - RHC (7/25): RA 19, PA 78/48 (47), PCW 28, PA sat 45%, CO/CI 4.6/2.3 (Fick), CO/CI 3.6/1.8 (TD).  - Echo (7/25): EF 30%, LV with GHK, mild LVH, RV not well seen, mild MR - Cardiac MRI (7/25): LVEF 34%, RVEF 48%, LGE LAD territory with some viability, nonviable myocardium at mid-apical inferoseptum. - Echo (11/25): EF 50-55% with mild LVH, mild RV dysfunction/normal RV size, IVC not dilated.  7. HTN 8. Hyperlipidemia  Current Outpatient Medications  Medication Sig Dispense Refill   acetaminophen  (TYLENOL ) 650 MG CR tablet Take 1,300 mg by mouth every 8 (eight) hours as needed for pain.     albuterol  (VENTOLIN  HFA) 108 (90 Base) MCG/ACT inhaler Inhale 2 puffs into the lungs every 4 (four) hours as needed for shortness of breath or wheezing.     aspirin  EC 81 MG tablet Take 81 mg by mouth daily.      atorvastatin  (LIPITOR ) 80 MG tablet Take 1 tablet (80 mg total) by mouth daily. 30 tablet 0   Cholecalciferol (VITAMIN D ) 2000 units tablet Take 2,000 Units by mouth daily.     Cinnamon 500 MG capsule Take  500 mg by mouth daily.     Coenzyme Q10 (COQ10) 100 MG CAPS Take 100 mg by mouth daily.      DULoxetine  (CYMBALTA ) 60 MG capsule Take 60 mg by mouth daily.     ezetimibe  (ZETIA ) 10 MG tablet Take 1 tablet (10 mg total) by mouth daily. 90 tablet 3   fluticasone  (FLONASE ) 50 MCG/ACT nasal spray Place 2 sprays into both nostrils at bedtime as needed for allergies or rhinitis.     furosemide  (LASIX ) 40 MG tablet Take 1 tablet (40 mg total) by mouth daily. 30 tablet 0   gabapentin  (NEURONTIN ) 300 MG capsule Take 1 capsule  (300 mg total) by mouth 2 (two) times daily. 60 capsule 0   hydrocortisone  cream 1 % Apply 1 Application topically daily as needed for itching.     insulin  aspart (NOVOLOG ) 100 UNIT/ML injection Use with pump for TDD around 100 units daily 100 mL 3   levocetirizine (XYZAL ) 5 MG tablet Take 5 mg by mouth daily.      montelukast (SINGULAIR) 10 MG tablet Take 10 mg by mouth daily.     omeprazole (PRILOSEC) 40 MG capsule Take 40 mg by mouth daily.     prednisoLONE  acetate (PRED FORTE ) 1 % ophthalmic suspension Place 1 drop into the left eye 2 (two) times daily.     sodium chloride  (MURO 128) 5 % ophthalmic solution Place 1 drop into the left eye 2 (two) times daily.     Turmeric 500 MG TABS Take 1 tablet by mouth daily.     Insulin  Disposable Pump (OMNIPOD 5 DEXG7G6 INTRO GEN 5) KIT Change pod every 48-72 hours (Patient not taking: Reported on 06/05/2024) 1 kit 0   Insulin  Disposable Pump (OMNIPOD 5 DEXG7G6 PODS GEN 5) MISC Change pod every 48-72 hours (Patient not taking: Reported on 06/05/2024) 15 each 6   metoprolol  succinate (TOPROL  XL) 25 MG 24 hr tablet Take 1 tablet (25 mg total) by mouth daily. 90 tablet 3   ofloxacin  (OCUFLOX ) 0.3 % ophthalmic solution Place 2 drops into the left eye 3 (three) times daily. (Patient not taking: Reported on 06/05/2024)     sodium zirconium cyclosilicate  (LOKELMA ) 10 g PACK packet Use 1 packet by mouth daily as needed as directed by the HF Clinic (Patient not taking: Reported on 06/05/2024) 30 packet 0   No current facility-administered medications for this encounter.   Allergies  Allergen Reactions   Bactrim [Sulfamethoxazole-Trimethoprim] Rash   Codeine Nausea And Vomiting   Hydromorphone  Nausea Only and Other (See Comments)    Diaphoretic, blood pressure issues, coughing,   Silvadene [Silver Sulfadiazine] Other (See Comments)    Red hot rash and blisters   Adhesive [Tape] Rash    Band aid brand     Mobic [Meloxicam] Swelling   Morphine  And Codeine  Nausea Only and Other (See Comments)    Confusion       Social History   Socioeconomic History   Marital status: Widowed    Spouse name: Not on file   Number of children: Not on file   Years of education: Not on file   Highest education level: Not on file  Occupational History   Not on file  Tobacco Use   Smoking status: Former    Current packs/day: 0.00    Types: Cigarettes    Start date: 05/30/1985    Quit date: 05/30/2010    Years since quitting: 14.0    Passive exposure: Never   Smokeless tobacco:  Never   Tobacco comments:    STOPPED 2007  Vaping Use   Vaping status: Never Used  Substance and Sexual Activity   Alcohol use: No   Drug use: No   Sexual activity: Not on file  Other Topics Concern   Not on file  Social History Narrative   Not on file   Social Drivers of Health   Financial Resource Strain: Low Risk  (02/26/2024)   Received from [facility]   Overall Financial Resource Strain (CARDIA)    Difficulty of Paying Living Expenses: Not hard at all  Food Insecurity: No Food Insecurity (02/26/2024)   Received from [facility]   Hunger Vital Sign    Within the past 12 months, you worried that your food would run out before you got the money to buy more.: Never true    Within the past 12 months, the food you bought just didn't last and you didn't have money to get more.: Never true  Transportation Needs: No Transportation Needs (02/26/2024)   Received from [facility] - Transportation    Lack of Transportation (Medical): No    Lack of Transportation (Non-Medical): No  Physical Activity: Inactive (02/26/2024)   Received from [facility]   Exercise Vital Sign    On average, how many days per week do you engage in moderate to strenuous exercise (like a brisk walk)?: 0 days    On average, how many minutes do you engage in exercise at this level?: 0 min  Stress: No Stress Concern Present (02/26/2024)   Received from [facility] - Occupational Stress Questionnaire    Feeling of Stress : Not at all  Social Connections: Moderately Isolated (02/26/2024)   Received from [facility]   Social Connection and Isolation Panel    In a typical week, how many times do you talk on the phone with family, friends, or neighbors?: More than three times a week    How often do you get together with friends or relatives?: More than three times a week    How often do you attend church or religious services?: More than 4 times per year    Do you belong to any clubs or organizations such as church groups, unions, fraternal or athletic groups, or school groups?: No    How often do you attend meetings of the clubs or organizations you belong to?: Never    Are you married, widowed, divorced, separated, never married, or living with a partner?: Widowed  Intimate Partner Violence: Not At Risk (02/06/2024)   Humiliation, Afraid, Rape, and Kick questionnaire    Fear of Current or Ex-Partner: No    Emotionally Abused: No    Physically Abused: No    Sexually Abused: No   Family History  Problem Relation Age of Onset   Heart attack Mother    Heart disease Mother    Hypertension Mother    Heart attack Brother    Heart disease Brother    Hypertension Brother    ROS: All systems reviewed and negative except as per HPI.   Wt Readings from Last 3 Encounters:  06/05/24 82.6 kg (182 lb 3.2 oz)  04/01/24 85.5 kg (188 lb 6.4 oz)  03/14/24 83.6 kg (184 lb 6.4 oz)   BP 110/60   Pulse 72   Ht 5' 3 (1.6 m)   Wt 82.6 kg (182 lb 3.2 oz)   SpO2 98%   BMI 32.28  kg/m   PHYSICAL EXAM: General: NAD Neck: No JVD, no thyromegaly or thyroid  nodule.  Lungs: Clear to auscultation bilaterally with normal respiratory effort. CV: Nondisplaced PMI.  Heart regular S1/S2, no S3/S4, no murmur.  Trace ankle edema.  No carotid bruit.  Unable to palpate pedal pulses.  Abdomen: Soft, nontender, no hepatosplenomegaly, no  distention.  Skin: Intact without lesions or rashes.  Neurologic: Alert and oriented x 3.  Psych: Normal affect. Extremities: No clubbing or cyanosis.  HEENT: Normal.   ASSESSMENT & PLAN: 1. HF with improved EF: Ischemic cardiomyopathy. Echo 7/25 showed EF 30%, global hypokinesis (prior echo in 2020 with normal EF). 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 Required milrinone  during this admission. cMRI showed LVEF 34%, RVEF 48%, LGE LAD territory with some viability, nonviable myocardium at mid-apical inferoseptum. Findings of MRI suggested ischemic cardiomyopathy.  Echo today showed improved EF 50-55% with mild LVH, mild RV dysfunction/normal RV size, IVC not dilated. NYHA class III mainly due to fatigue, she is not volume overloaded by exam.  GDMT limited by CKD and hyperkalemia.   - Continue Lasix  40 mg daily.  - Stop digoxin  with improved EF.  - Increase Toprol  XL to 25 mg daily.  - No SGLT2i with type I diabetes.  - No ARB/ARNi or spironolactone  with hyperkalemia. - EF now out of ICD range.  2. 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 Has had chronic hyperkalemia.  - BMET today, ?need to resume Lokelma .  - Refer to nephrology. 3. CAD: H/o CABG x 2.  NSTEMI in 7/25.  Cath showed patent LIMA-LAD and SVG-PDA with unchanged native vessels (occluded RCA and LAD, occluded OMs). No target for intervention. No chest pain. - Continue ASA 81.  - She should restart Plavix  75 mg daily post-MI.  - Continue atorvastatin  80 and Zetia  10. LDL still > 55, will refer to lipid clinic for Repatha.  4. PAD: Followed as outpatient by Dr. Darron. Had severe stenosis right EIA on 4/25 peripheral arterial dopplers.  Has significant right leg claudication. No rest symptoms or pedal ulcerations.  - Avoid cilostazol  with CHF - Continue lipid management.  - On ASA/Plavix .  - Followup with vascular scheudled in  1/26.  5. 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  CTA chest negative for PE or parenchymal lung disease in 7/25.  - Compliant with CPAP 6. DM I: No SGLT2i. . - Followed by endocrine. 7. OSA: Compliant with CPAP 8. Palpitations/extreme fatigue: Has spells of palpitations/fatigue. No syncope/presyncope.  - I will place Zio monitor x 2 wks to assess for arrhythmias.   Followup 3 months with APP.   I spent 41 minutes reviewing records, interviewing/examining patient, and managing orders.  Ezra Shuck, MD 06/06/24

## 2024-06-12 ENCOUNTER — Encounter: Payer: Self-pay | Admitting: Nurse Practitioner

## 2024-06-12 ENCOUNTER — Ambulatory Visit: Admitting: Nurse Practitioner

## 2024-06-12 VITALS — BP 118/82 | HR 64 | Ht 63.0 in | Wt 183.2 lb

## 2024-06-12 DIAGNOSIS — Z794 Long term (current) use of insulin: Secondary | ICD-10-CM

## 2024-06-12 DIAGNOSIS — E782 Mixed hyperlipidemia: Secondary | ICD-10-CM | POA: Diagnosis not present

## 2024-06-12 DIAGNOSIS — E559 Vitamin D deficiency, unspecified: Secondary | ICD-10-CM

## 2024-06-12 DIAGNOSIS — N1832 Chronic kidney disease, stage 3b: Secondary | ICD-10-CM | POA: Diagnosis not present

## 2024-06-12 DIAGNOSIS — E1022 Type 1 diabetes mellitus with diabetic chronic kidney disease: Secondary | ICD-10-CM

## 2024-06-12 DIAGNOSIS — I10 Essential (primary) hypertension: Secondary | ICD-10-CM

## 2024-06-12 NOTE — Progress Notes (Signed)
 Endocrinology Follow Up Note       06/12/2024, 11:30 AM   Subjective:    Patient ID: Kiara [name], female    DOB: 07/28/1951.  Kiara [name] is being seen in follow up after being seen in consultation for management of currently uncontrolled symptomatic diabetes requested by  Lawrance Handing, MD.   Past Medical History:  Diagnosis Date   Anemia    Anxiety    Arthritis    Carotid artery disease    a. Last carotid study in 2014 showed 1-39% RICA, 40-59% LICA.   Chronic combined systolic (congestive) and diastolic (congestive) heart failure (HCC)    a. EF 40-45% in 2014. b. EF 30-35% in 07/2017.   CKD (chronic kidney disease), stage III (HCC)    Coronary artery disease    a. s/p CABG 2014. b. Cath 07/2017 for NSTEMI -> med rx.   Diabetes mellitus (HCC)    Diabetic peripheral neuropathy (HCC)    Eczema    GERD (gastroesophageal reflux disease)    H/O hiatal hernia    Hyperlipidemia    Hypertension    Myocardial infarction (HCC)    PONV (postoperative nausea and vomiting)    Sleep apnea    Umbilical hernia     Past Surgical History:  Procedure Laterality Date   ABDOMINAL AORTOGRAM W/LOWER EXTREMITY N/A 03/06/2018   Procedure: ABDOMINAL AORTOGRAM W/LOWER EXTREMITY;  Surgeon: Darron Deatrice LABOR, MD;  Location: [facility];  Service: Cardiovascular;  Laterality: N/A;   ABDOMINAL AORTOGRAM W/LOWER EXTREMITY N/A 05/16/2023   Procedure: ABDOMINAL AORTOGRAM W/LOWER EXTREMITY;  Surgeon: Darron Deatrice LABOR, MD;  Location: [facility];  Service: Cardiovascular;  Laterality: N/A;   ABDOMINAL HYSTERECTOMY     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     ARTERY BIOPSY Right 02/13/2024   Procedure: BIOPSY TEMPORAL ARTERY;  Surgeon: Magda Debby SAILOR, MD;  Location: [facility] OR;  Service: Vascular;  Laterality: Right;   CARDIAC CATHETERIZATION     CARPAL TUNNEL RELEASE     CESAREAN SECTION  1983   CORONARY ARTERY  BYPASS GRAFT N/A 04/11/2013   Procedure: CORONARY ARTERY BYPASS GRAFTING (CABG);  Surgeon: Dorise MARLA Fellers, MD;  Location: [facility];  Service: Open Heart Surgery;  Laterality: N/A;   EYE SURGERY Bilateral    laser   FRACTURE SURGERY Left 2005   PERIPHERAL INTRAVASCULAR LITHOTRIPSY  05/16/2023   Procedure: PERIPHERAL INTRAVASCULAR LITHOTRIPSY;  Surgeon: Darron Deatrice LABOR, MD;  Location: [facility];  Service: Cardiovascular;;   PERIPHERAL VASCULAR INTERVENTION  05/16/2023   Procedure: PERIPHERAL VASCULAR INTERVENTION;  Surgeon: Darron Deatrice LABOR, MD;  [facility]: [facility];  Service: Cardiovascular;;   RIGHT HEART CATH AND CORONARY/GRAFT ANGIOGRAPHY N/A 02/08/2024   Procedure: RIGHT HEART CATH AND CORONARY/GRAFT ANGIOGRAPHY;  Surgeon: Elmira Newman PARAS, MD;  [facility]: [facility];  Service: Cardiovascular;  Laterality: N/A;   RIGHT/LEFT HEART CATH AND CORONARY/GRAFT ANGIOGRAPHY N/A 08/06/2017   Procedure: RIGHT/LEFT HEART CATH AND CORONARY/GRAFT ANGIOGRAPHY;  Surgeon: Mady Bruckner, MD;  [facility]: [facility];  Service: Cardiovascular;  Laterality: N/A;    Social History   Socioeconomic History   Marital status: Widowed  Spouse name: Not on file   Number of children: Not on file   Years of education: Not on file   Highest education level: Not on file  Occupational History   Not on file  Tobacco Use   Smoking status: Former    Current packs/day: 0.00    Types: Cigarettes    Start date: 05/30/1985    Quit date: 05/30/2010    Years since quitting: 14.0    Passive exposure: Never   Smokeless tobacco: Never   Tobacco comments:    STOPPED 2007  Vaping Use   Vaping status: Never Used  Substance and Sexual Activity   Alcohol use: No   Drug use: No   Sexual activity: Not on file  Other Topics Concern   Not on file  Social History Narrative   Not on file   Social Drivers of Health   Financial Resource Strain: Low Risk  (02/26/2024)   Received from  [facility]   Overall Financial Resource Strain (CARDIA)    Difficulty of Paying Living Expenses: Not hard at all  Food Insecurity: No Food Insecurity (02/26/2024)   Received from [facility]   Hunger Vital Sign    Within the past 12 months, you worried that your food would run out before you got the money to buy more.: Never true    Within the past 12 months, the food you bought just didn't last and you didn't have money to get more.: Never true  Transportation Needs: No Transportation Needs (02/26/2024)   Received from [facility] - Transportation    Lack of Transportation (Medical): No    Lack of Transportation (Non-Medical): No  Physical Activity: Inactive (02/26/2024)   Received from [facility]   Exercise Vital Sign    On average, how many days per week do you engage in moderate to strenuous exercise (like a brisk walk)?: 0 days    On average, how many minutes do you engage in exercise at this level?: 0 min  Stress: No Stress Concern Present (02/26/2024)   Received from [facility] of Occupational Health - Occupational Stress Questionnaire    Feeling of Stress : Not at all  Social Connections: Moderately Isolated (02/26/2024)   Received from [facility]   Social Connection and Isolation Panel    In a typical week, how many times do you talk on the phone with family, friends, or neighbors?: More than three times a week    How often do you get together with friends or relatives?: More than three times a week    How often do you attend church or religious services?: More than 4 times per year    Do you belong to any clubs or organizations such as church groups, unions, fraternal or athletic groups, or school groups?: No    How often do you attend meetings of the clubs or organizations you belong to?: Never    Are you married, widowed, divorced, separated, never married, or living with a partner?: Widowed    Family History  Problem  Relation Age of Onset   Heart attack Mother    Heart disease Mother    Hypertension Mother    Heart attack Brother    Heart disease Brother    Hypertension Brother     Outpatient Encounter Medications as of 06/12/2024  Medication Sig   acetaminophen  (TYLENOL ) 650 MG CR tablet Take 1,300 mg by mouth every 8 (eight) hours as needed for  pain.   albuterol  (VENTOLIN  HFA) 108 (90 Base) MCG/ACT inhaler Inhale 2 puffs into the lungs every 4 (four) hours as needed for shortness of breath or wheezing.   aspirin  EC 81 MG tablet Take 81 mg by mouth daily.    atorvastatin  (LIPITOR ) 80 MG tablet Take 1 tablet (80 mg total) by mouth daily.   Cholecalciferol (VITAMIN D ) 2000 units tablet Take 2,000 Units by mouth daily.   Cinnamon 500 MG capsule Take 500 mg by mouth daily.   Coenzyme Q10 (COQ10) 100 MG CAPS Take 100 mg by mouth daily.    DULoxetine  (CYMBALTA ) 60 MG capsule Take 60 mg by mouth daily.   ezetimibe  (ZETIA ) 10 MG tablet Take 1 tablet (10 mg total) by mouth daily.   fluticasone  (FLONASE ) 50 MCG/ACT nasal spray Place 2 sprays into both nostrils at bedtime as needed for allergies or rhinitis.   furosemide  (LASIX ) 40 MG tablet Take 1 tablet (40 mg total) by mouth daily.   gabapentin  (NEURONTIN ) 300 MG capsule Take 1 capsule (300 mg total) by mouth 2 (two) times daily.   hydrocortisone  cream 1 % Apply 1 Application topically daily as needed for itching.   insulin  aspart (NOVOLOG ) 100 UNIT/ML injection Use with pump for TDD around 100 units daily   levocetirizine (XYZAL ) 5 MG tablet Take 5 mg by mouth daily.    metoprolol  succinate (TOPROL  XL) 25 MG 24 hr tablet Take 1 tablet (25 mg total) by mouth daily.   montelukast (SINGULAIR) 10 MG tablet Take 10 mg by mouth daily.   ofloxacin  (OCUFLOX ) 0.3 % ophthalmic solution Place 2 drops into the left eye 3 (three) times daily.   omeprazole (PRILOSEC) 40 MG capsule Take 40 mg by mouth daily.   prednisoLONE  acetate (PRED FORTE ) 1 % ophthalmic suspension  Place 1 drop into the left eye 2 (two) times daily.   sodium chloride  (MURO 128) 5 % ophthalmic solution Place 1 drop into the left eye 2 (two) times daily.   Turmeric 500 MG TABS Take 1 tablet by mouth daily.   Insulin  Disposable Pump (OMNIPOD 5 DEXG7G6 INTRO GEN 5) KIT Change pod every 48-72 hours (Patient not taking: Reported on 06/12/2024)   Insulin  Disposable Pump (OMNIPOD 5 DEXG7G6 PODS GEN 5) MISC Change pod every 48-72 hours (Patient not taking: Reported on 06/12/2024)   sodium zirconium cyclosilicate  (LOKELMA ) 10 g PACK packet Use 1 packet by mouth daily as needed as directed by the HF Clinic (Patient not taking: Reported on 06/12/2024)   No facility-administered encounter medications on file as of 06/12/2024.    ALLERGIES: Allergies  Allergen Reactions   Bactrim [Sulfamethoxazole-Trimethoprim] Rash   Codeine Nausea And Vomiting   Hydromorphone  Nausea Only and Other (See Comments)    Diaphoretic, blood pressure issues, coughing,   Silvadene [Silver Sulfadiazine] Other (See Comments)    Red hot rash and blisters   Adhesive [Tape] Rash    Band aid brand     Mobic [Meloxicam] Swelling   Morphine  And Codeine Nausea Only and Other (See Comments)    Confusion     VACCINATION STATUS: Immunization History  Administered Date(s) Administered   Moderna Sars-Covid-2 Vaccination 10/13/2019, 12/16/2019    Diabetes She presents for her follow-up diabetic visit. She has type 1 diabetes mellitus. Onset time: diagnosed at age 70. Her disease course has been fluctuating. There are no hypoglycemic associated symptoms. Associated symptoms include blurred vision and fatigue. There are no hypoglycemic complications. Symptoms are stable. Diabetic complications include heart disease, nephropathy, peripheral  neuropathy, PVD and retinopathy. Risk factors for coronary artery disease include diabetes mellitus, family history, hypertension, obesity, sedentary lifestyle, post-menopausal and tobacco  exposure. Current diabetic treatment includes insulin  pump (Medtronic). She is compliant with treatment most of the time. Her weight is fluctuating minimally. She is following a generally unhealthy diet. When asked about meal planning, she reported none. She has not had a previous visit with a dietitian. She rarely participates in exercise. Her home blood glucose trend is decreasing steadily. Her breakfast blood glucose range is generally >200 mg/dl. Her lunch blood glucose range is generally >200 mg/dl. Her dinner blood glucose range is generally >200 mg/dl. Her bedtime blood glucose range is generally >200 mg/dl. Her overall blood glucose range is >200 mg/dl. (She presents today with her CGM and Medtronic pump showing gross hyperglycemia overall.  Her most recent A1c on 10/29 was 14.9%, increasing from last visit of 10%.  She notes she has really been struggling with depression lately.  She could not afford the copay for Omnipod which she is bummed about as she knows it will help.  She has also not been as consistent with bolusing prior to meals.  Analysis of her CGM shows TIR 7%, TAR 93%, TBR 0% with a GMI of 10.2%.) An ACE inhibitor/angiotensin II receptor blocker is not being taken. She sees a podiatrist.Eye exam is current.     Review of systems  Constitutional: + Minimally fluctuating body weight, current Body mass index is 32.45 kg/m., no fatigue, no subjective hyperthermia, no subjective hypothermia Eyes: + blurry vision- hx retinopathy, no xerophthalmia ENT: no sore throat, no nodules palpated in throat, no dysphagia/odynophagia, no hoarseness Cardiovascular: no chest pain, no shortness of breath, no palpitations Respiratory: no cough, no shortness of breath Gastrointestinal: no nausea/vomiting/diarrhea, intermittent constipation Musculoskeletal: + diffuse muscle/joint aches Skin: no rashes, no hyperemia Neurological: no tremors, no numbness, no tingling, no dizziness Psychiatric: +  depression, no anxiety  Objective:     BP 118/82 (BP Location: Left Arm, Patient Position: Sitting, Cuff Size: Large)   Pulse 64   Ht 5' 3 (1.6 m)   Wt 183 lb 3.2 oz (83.1 kg)   BMI 32.45 kg/m   Wt Readings from Last 3 Encounters:  06/12/24 183 lb 3.2 oz (83.1 kg)  06/05/24 182 lb 3.2 oz (82.6 kg)  04/01/24 188 lb 6.4 oz (85.5 kg)     BP Readings from Last 3 Encounters:  06/12/24 118/82  06/05/24 110/60  04/01/24 (!) 146/82      Physical Exam- Limited  Constitutional:  Body mass index is 32.45 kg/m. , not in acute distress, normal state of mind Eyes:  EOMI, no exophthalmos Musculoskeletal: no gross deformities, strength intact in all four extremities, no gross restriction of joint movements Skin:  no rashes, no hyperemia Neurological: no tremor with outstretched hands   Diabetic Foot Exam - Simple   No data filed      CMP ( most recent) CMP     Component Value Date/Time   NA 137 06/05/2024 1146   NA 133 (L) 02/29/2024 1011   K 4.8 06/05/2024 1146   CL 98 06/05/2024 1146   CO2 29 06/05/2024 1146   GLUCOSE 315 (H) 06/05/2024 1146   BUN 24 (H) 06/05/2024 1146   BUN 24 02/29/2024 1011   CREATININE 1.14 (H) 06/05/2024 1146   CALCIUM  9.2 06/05/2024 1146   PROT 8.0 08/22/2017 1447   ALBUMIN  3.4 (L) 08/22/2017 1447   AST 28 08/22/2017 1447   ALT 20  08/22/2017 1447   ALKPHOS 94 08/22/2017 1447   BILITOT 0.5 08/22/2017 1447   EGFR 43 (L) 02/29/2024 1011   GFRNONAA 51 (L) 06/05/2024 1146     Diabetic Labs (most recent): Lab Results  Component Value Date   HGBA1C 10.0 (H) 02/10/2024   HGBA1C 10.6 11/14/2023   HGBA1C 13.5 07/31/2023     Lipid Panel ( most recent) Lipid Panel     Component Value Date/Time   CHOL 77 02/07/2024 0325   TRIG 102 02/07/2024 0325   HDL 29 (L) 02/07/2024 0325   CHOLHDL 2.7 02/07/2024 0325   VLDL 20 02/07/2024 0325   LDLCALC 28 02/07/2024 0325      Lab Results  Component Value Date   TSH 2.066 07/28/2019            Assessment & Plan:   1) 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  Her most recent A1c on 10/29 was 14.9%, increasing from last visit of 10%.  She notes she has really been struggling with depression lately.  She could not afford the copay for Omnipod which she is bummed about as she knows it will help.  She has also not been as consistent with bolusing prior to meals.  Analysis of her CGM shows TIR 7%, TAR 93%, TBR 0% with a GMI of 10.2%.  - Kiara [name] has currently uncontrolled symptomatic type 1 DM since 73 years of age.   -Recent labs reviewed.  - I had a long discussion with her about the progressive nature of diabetes and the pathology behind its complications. -her diabetes is complicated by CHF, CKD stage 3b, CAD with MI, retinopathy, neuropathy, PVD.  The following Lifestyle Medicine recommendations according to American College of Lifestyle Medicine [facility]) were discussed and offered to patient and she agrees to start the journey:  A. Whole Foods, Plant-based plate comprising of fruits and vegetables, plant-based proteins, whole-grain carbohydrates was discussed in detail with the patient.   A list for source of those nutrients were also provided to the patient.  Patient will use only water or unsweetened tea for hydration. B.  The need to stay away from risky substances including alcohol, smoking; obtaining 7 to 9 hours of restorative sleep, at least 150 minutes of moderate intensity exercise weekly, the importance of healthy social connections,  and stress reduction techniques were discussed. C.  A full color page of  Calorie density of various food groups per pound showing examples of each food groups was provided to the patient.  - Nutritional counseling repeated/built upon at each appointment.  - The patient admits there is a room for improvement  in their diet and drink choices. -  Suggestion is made for the patient to avoid simple carbohydrates from their diet including Cakes, Sweet Desserts / Pastries, Ice Cream, Soda (diet and regular), Sweet Tea, Candies, Chips, Cookies, Sweet Pastries, Store Bought Juices, Alcohol in Excess of 1-2 drinks a day, Artificial Sweeteners, Coffee Creamer, and Sugar-free Products. This will help patient to have stable blood glucose profile and potentially avoid unintended weight gain.   - I encouraged the patient to switch to unprocessed or minimally processed complex starch and increased protein intake (animal or plant source), fruits, and vegetables.   - Patient is advised to stick to a routine mealtimes to eat 3 meals a day and avoid unnecessary snacks (to snack only to correct  hypoglycemia).  - I have approached her with the following individualized plan to manage her diabetes and patient agrees:   - I did make slight adjustment to her Medtronic pump settings today.  I changed her basal rate to 1.55 units per hour (was set at 1.25 at night).   Will check with Omnipod rep to see if there is any programs to help her cover the costs of her supplies.  -she is encouraged to continue monitoring glucose 4 times daily (using her CGM), before meals and before bed, and to call the clinic if she has readings less than 70 or above 300 for 3 tests in a row.    - she is warned not to take insulin  without proper monitoring per orders. - Adjustment parameters are given to her for hypo and hyperglycemia in writing.  -She is not a candidate for non-insulin  therapies given her type 1 diagnosis.  - Specific targets for  A1c; LDL, HDL, and Triglycerides were discussed with the patient.  2) Blood Pressure /Hypertension: her blood pressure is controlled to target.   she is advised to continue her current medications as prescribed by her PCP.  3) Lipids/Hyperlipidemia:    Review of her recent lipid panel from 05/28/24  showed controlled LDL at 4974 .  she is advised to continue Crestor  40 mg daily at bedtime.  Side effects and precautions discussed with her.  4)  Weight/Diet:  her Body mass index is 32.45 kg/m.  -  clearly complicating her diabetes care.   she is a candidate for weight loss. I discussed with her the fact that loss of 5 - 10% of her  current body weight will have the most impact on her diabetes management.  Exercise, and detailed carbohydrates information provided  -  detailed on discharge instructions.  5) Chronic Care/Health Maintenance: -she is not on ACEI/ARB and is on Statin medications and is encouraged to initiate and continue to follow up with Ophthalmology, Dentist, Podiatrist at least yearly or according to recommendations, and advised to stay away from smoking (quit over 30 years ago). I have recommended yearly flu vaccine and pneumonia vaccine at least every 5 years; moderate intensity exercise for up to 150 minutes weekly; and sleep for at least 7 hours a day.  - she is advised to maintain close follow up with Lawrance Handing, MD for primary care needs, as well as her other providers for optimal and coordinated care.     I spent  53  minutes in the care of the patient today including review of labs from CMP, Lipids, Thyroid  Function, Hematology (current and previous including abstractions from other facilities); face-to-face time discussing  her blood glucose readings/logs, discussing hypoglycemia and hyperglycemia episodes and symptoms, medications doses, her options of short and long term treatment based on the latest standards of care / guidelines;  discussion about incorporating lifestyle medicine;  and documenting the encounter. Risk reduction counseling performed per USPSTF guidelines to reduce obesity and cardiovascular risk factors.     Please refer to Patient Instructions for Blood Glucose Monitoring and Insulin /Medications Dosing Guide  in media tab for additional  information. Please  also refer to  Patient Self Inventory in the Media  tab for reviewed elements of pertinent patient history.  Kiara [name] participated in the discussions, expressed understanding, and voiced agreement with the above plans.  All questions were answered to her satisfaction. she is encouraged to contact clinic should she have any questions or concerns prior to her  return visit.     Follow up plan: - Return in about 4 months (around 10/10/2024) for Diabetes F/U with A1c in office, No previsit labs, Bring meter and logs.   Benton Rio, [facility] [address] [place], [place] [address] Phone: [phone] Fax: [phone]  06/12/2024, 11:30 AM

## 2024-06-17 ENCOUNTER — Encounter: Payer: Self-pay | Admitting: Nurse Practitioner

## 2024-06-25 NOTE — Addendum Note (Signed)
 Encounter addended by: Debarah Garrison MATSU, RN on: 06/25/2024 3:09 PM  Actions taken: Imaging Exam ended

## 2024-06-29 ENCOUNTER — Ambulatory Visit (HOSPITAL_COMMUNITY): Payer: Self-pay | Admitting: Cardiology

## 2024-08-05 ENCOUNTER — Telehealth: Payer: Self-pay | Admitting: Pharmacy Technician

## 2024-08-05 NOTE — Telephone Encounter (Signed)
 Pharmacy Patient Advocate Encounter   Received notification from Fax that prior authorization for repatha  is required/requested.   Insurance verification completed.   The patient is insured through [facility] .   Per test claim: PA required; PA submitted to above mentioned insurance via Fax Key/confirmation #/EOC fax Status is pending

## 2024-08-05 NOTE — Telephone Encounter (Signed)
 Pharmacy Patient Advocate Encounter  Received notification from [employer] [facility] that Prior Authorization for repatha has been APPROVED from 07/31/24 to 07/30/25   PA #/Case ID/Reference #: [id number]

## 2024-08-26 ENCOUNTER — Ambulatory Visit: Admitting: Cardiovascular Disease

## 2024-08-29 ENCOUNTER — Telehealth (HOSPITAL_COMMUNITY): Payer: Self-pay | Admitting: Cardiology

## 2024-08-29 NOTE — Telephone Encounter (Signed)
 Clearance returned via epic routing and printed for fax

## 2024-08-29 NOTE — Telephone Encounter (Signed)
" °  ADVANCED [facility] FAILURE CLIN[facility]   Pre-operative Risk Assessment   HEARTCARE STAFF-IMPORTANT INSTRUCTIONS 1 Red and Blue Text will auto delete once note is signed or closed. 2 Press F2 to navigate through template.   3 On drop down lists, L click to select >> R click to activate next field 4 Reason for Visit format is IMPORTANT!!  See Directions on No. 2 below. 5 Please review chart to determine if there is already a clearance note open for this procedure!!  DO NOT duplicate if a note already exists!!    :1}      Request for Surgical Clearance    Procedure:  corneal transplant-DMEK  Date of Surgery:  Clearance 09/09/24                                 Surgeon:  Fairy Lofty, MD Surgeon's Group or Practice Name:  [facility] Phone number:  [phone] Fax number:  [phone]   Type of Clearance Requested:   - Medical    Type of Anesthesia:  Local -with light sedation   Additional requests/questions:  Please fax a copy of most recent OV and hospital notes from July 2025 to the surgeon's office.  Bonney Jerona Dalton CHRISTELLA   08/29/2024, 11:17 AM   [facility] Ezra Shuck, MD (enter provider) [facility] [address] [facility] [place] [address] [phone] (office) [phone] (fax)   "

## 2024-09-03 NOTE — Progress Notes (Incomplete)
 "  ADVANCED HF CLINIC CONSULT NOTE  PCP: Lawrance Handing, MD Primary Cardiologist: Diannah SHAUNNA [name], MD  HF Cardiologist: Dr. Rolan  Chief complaint: CHF  HPI: Kiara [name] is a 74 y.o. female with chronic HFrEF, CAD s/p CABG x2 in 2014, DM1, HLD, PAD and CKD stage 3a.    Admitted 7/25 with NSTEMI. Diuretics were held for several days to allow renal recovery prior to [facility]. L/RHC showed patent LIMA to LAD and SVG to PDA with severe native vessel disease. No targets for PCI. RA 19, PA 78/48 (47), PCW 28, PA sat 45%, Fick CO/CI 4.6/2.3. TD CO/CI 3.6/1.8. Required milrinone  gtt. cMRI showed LVEF 34%, RVEF 48%, LGE LAD territory with some viability, nonviable myocardium at mid-apical inferoseptum. Findings suggest ischemic cardiomyopathy. Hospitalization complicated by possible temporal arteritis, treated with steroids and underwent temporal artery biopsy which was negative. GDMT titrated. Discharge weight 191 lbs.   Echo was done today and reviewed, EF 50-55% with mild LVH, mild RV dysfunction/normal RV size, IVC not dilated.   CKD stage 3 has been complicated by chronic hyperkalemia. She is not currently taking Lokelma .   Patient returns for followup of CHF.  She sleeps a lot and fatigues easily.  She is using her CPAP nightly.  Follows with endocrinology for type 1 diabetes. No significant exertional dyspnea.  Main complaint is that her legs give out.  Right leg is worse. There is no pain but calf will get tight and weak with prolonged walking. She has to slow down.  No pedal ulcerations or rest symptoms. No chest pain.  She has episodes 2-3 times/week where she will feel severe fatigue that will last for 10-20 minutes.  She feels like these are hypoglycemic episodes, but when she checks her blood glucose, it is not low.  She will get diaphoresis and palpitations with these episodes.   ECG (personally reviewed): NSR, lateral TWIs  Labs (10/25): LDL 74, TGs 170, K 5.5, creatinine  1.49  PMH: 1. Type 1 diabetes 2. CKD stage 3 with hyperkalemia. 3. OSA: Uses CPAP.  4. PAD: Aortoiliac dopplers in 4/25 with severe stenosis distal right external iliac artery.  5. CAD: s/p CABG 2014.  - NSTEMI 7/25, cath showed occluded RCA, mid LAD, OM1 and OM2.  SVG-PDA patent, LIMA-LAD patent. No target for intervention.  6. Chronic systolic CHF: Ischemic cardiomyopathy.  - RHC (7/25): RA 19, PA 78/48 (47), PCW 28, PA sat 45%, CO/CI 4.6/2.3 (Fick), CO/CI 3.6/1.8 (TD).  - Echo (7/25): EF 30%, LV with GHK, mild LVH, RV not well seen, mild MR - Cardiac MRI (7/25): LVEF 34%, RVEF 48%, LGE LAD territory with some viability, nonviable myocardium at mid-apical inferoseptum. - Echo (11/25): EF 50-55% with mild LVH, mild RV dysfunction/normal RV size, IVC not dilated.  7. HTN 8. Hyperlipidemia  Current Outpatient Medications  Medication Sig Dispense Refill   acetaminophen  (TYLENOL ) 650 MG CR tablet Take 1,300 mg by mouth every 8 (eight) hours as needed for pain.     albuterol  (VENTOLIN  HFA) 108 (90 Base) MCG/ACT inhaler Inhale 2 puffs into the lungs every 4 (four) hours as needed for shortness of breath or wheezing.     aspirin  EC 81 MG tablet Take 81 mg by mouth daily.      atorvastatin  (LIPITOR ) 80 MG tablet Take 1 tablet (80 mg total) by mouth daily. 30 tablet 0   Cholecalciferol (VITAMIN D ) 2000 units tablet Take 2,000 Units by mouth daily.     Cinnamon 500 MG capsule  Take 500 mg by mouth daily.     Coenzyme Q10 (COQ10) 100 MG CAPS Take 100 mg by mouth daily.      DULoxetine  (CYMBALTA ) 60 MG capsule Take 60 mg by mouth daily.     ezetimibe  (ZETIA ) 10 MG tablet Take 1 tablet (10 mg total) by mouth daily. 90 tablet 3   fluticasone  (FLONASE ) 50 MCG/ACT nasal spray Place 2 sprays into both nostrils at bedtime as needed for allergies or rhinitis.     furosemide  (LASIX ) 40 MG tablet Take 1 tablet (40 mg total) by mouth daily. 30 tablet 0   gabapentin  (NEURONTIN ) 300 MG capsule Take 1 capsule  (300 mg total) by mouth 2 (two) times daily. 60 capsule 0   hydrocortisone  cream 1 % Apply 1 Application topically daily as needed for itching.     insulin  aspart (NOVOLOG ) 100 UNIT/ML injection Use with pump for TDD around 100 units daily 100 mL 3   Insulin  Disposable Pump (OMNIPOD 5 DEXG7G6 INTRO GEN 5) KIT Change pod every 48-72 hours (Patient not taking: Reported on 06/12/2024) 1 kit 0   Insulin  Disposable Pump (OMNIPOD 5 DEXG7G6 PODS GEN 5) MISC Change pod every 48-72 hours (Patient not taking: Reported on 06/12/2024) 15 each 6   levocetirizine (XYZAL ) 5 MG tablet Take 5 mg by mouth daily.      metoprolol  succinate (TOPROL  XL) 25 MG 24 hr tablet Take 1 tablet (25 mg total) by mouth daily. 90 tablet 3   montelukast (SINGULAIR) 10 MG tablet Take 10 mg by mouth daily.     ofloxacin  (OCUFLOX ) 0.3 % ophthalmic solution Place 2 drops into the left eye 3 (three) times daily.     omeprazole (PRILOSEC) 40 MG capsule Take 40 mg by mouth daily.     prednisoLONE  acetate (PRED FORTE ) 1 % ophthalmic suspension Place 1 drop into the left eye 2 (two) times daily.     sodium chloride  (MURO 128) 5 % ophthalmic solution Place 1 drop into the left eye 2 (two) times daily.     sodium zirconium cyclosilicate  (LOKELMA ) 10 g PACK packet Use 1 packet by mouth daily as needed as directed by the HF Clinic (Patient not taking: Reported on 06/12/2024) 30 packet 0   Turmeric 500 MG TABS Take 1 tablet by mouth daily.     No current facility-administered medications for this visit.   Allergies  Allergen Reactions   Bactrim [Sulfamethoxazole-Trimethoprim] Rash   Codeine Nausea And Vomiting   Hydromorphone  Nausea Only and Other (See Comments)    Diaphoretic, blood pressure issues, coughing,   Silvadene [Silver Sulfadiazine] Other (See Comments)    Red hot rash and blisters   Adhesive [Tape] Rash    Band aid brand     Mobic [Meloxicam] Swelling   Morphine  And Codeine Nausea Only and Other (See Comments)    Confusion        Social History   Socioeconomic History   Marital status: Widowed    Spouse name: Not on file   Number of children: Not on file   Years of education: Not on file   Highest education level: Not on file  Occupational History   Not on file  Tobacco Use   Smoking status: Former    Current packs/day: 0.00    Types: Cigarettes    Start date: 05/30/1985    Quit date: 05/30/2010    Years since quitting: 14.2    Passive exposure: Never   Smokeless tobacco: Never   Tobacco comments:  STOPPED 2007  Vaping Use   Vaping status: Never Used  Substance and Sexual Activity   Alcohol use: No   Drug use: No   Sexual activity: Not on file  Other Topics Concern   Not on file  Social History Narrative   Not on file   Social Drivers of Health   Tobacco Use: Medium Risk (08/06/2024)   Received from [facility]   Patient History    Smoking Tobacco Use: Former    Smokeless Tobacco Use: Never    Passive Exposure: Not on file  Financial Resource Strain: Low Risk  (02/26/2024)   Received from [facility]   Overall Financial Resource Strain (CARDIA)    Difficulty of Paying Living Expenses: Not hard at all  Food Insecurity: No Food Insecurity (02/26/2024)   Received from [facility]    Within the past 12 months, you worried that your food would run out before you got the money to buy more.: Never true    Within the past 12 months, the food you bought just didn't last and you didn't have money to get more.: Never true  Transportation Needs: No Transportation Needs (02/26/2024)   Received from [facility] - Transportation    Lack of Transportation (Medical): No    Lack of Transportation (Non-Medical): No  Physical Activity: Inactive (02/26/2024)   Received from [facility]   Exercise Vital Sign    On average, how many days per week do you engage in moderate to strenuous exercise (like a brisk walk)?: 0 days    On average, how many minutes do you  engage in exercise at this level?: 0 min  Stress: No Stress Concern Present (02/26/2024)   Received from [facility] - Occupational Stress Questionnaire    Feeling of Stress : Not at all  Social Connections: Moderately Isolated (02/26/2024)   Received from [facility]   Social Connection and Isolation Panel    In a typical week, how many times do you talk on the phone with family, friends, or neighbors?: More than three times a week    How often do you get together with friends or relatives?: More than three times a week    How often do you attend church or religious services?: More than 4 times per year    Do you belong to any clubs or organizations such as church groups, unions, fraternal or athletic groups, or school groups?: No    How often do you attend meetings of the clubs or organizations you belong to?: Never    Are you married, widowed, divorced, separated, never married, or living with a partner?: Widowed  Intimate Partner Violence: Not At Risk (02/06/2024)   Epic    Fear of Current or Ex-Partner: No    Emotionally Abused: No    Physically Abused: No    Sexually Abused: No  Depression (PHQ2-9): Not on file  Alcohol Screen: Not on file  Housing: Low Risk  (02/26/2024)   Received from [facility]    In the last 12 months, was there a time when you were not able to pay the mortgage or rent on time?: No    In the past 12 months, how many times have you moved where you were living?: 0    At any time in the past 12 months, were you homeless or living in a shelter (including now)?: No  Utilities: Not  At Risk (02/26/2024)   Received from [facility] Utilities    Threatened with loss of utilities: No  Health Literacy: Not on file   Family History  Problem Relation Age of Onset   Heart attack Mother    Heart disease Mother    Hypertension Mother    Heart attack Brother    Heart disease Brother    Hypertension  Brother    ROS: All systems reviewed and negative except as per HPI.   Wt Readings from Last 3 Encounters:  06/12/24 83.1 kg (183 lb 3.2 oz)  06/05/24 82.6 kg (182 lb 3.2 oz)  04/01/24 85.5 kg (188 lb 6.4 oz)   There were no vitals taken for this visit.  PHYSICAL EXAM: General: NAD Neck: No JVD, no thyromegaly or thyroid  nodule.  Lungs: Clear to auscultation bilaterally with normal respiratory effort. CV: Nondisplaced PMI.  Heart regular S1/S2, no S3/S4, no murmur.  Trace ankle edema.  No carotid bruit.  Unable to palpate pedal pulses.  Abdomen: Soft, nontender, no hepatosplenomegaly, no distention.  Skin: Intact without lesions or rashes.  Neurologic: Alert and oriented x 3.  Psych: Normal affect. Extremities: No clubbing or cyanosis.  HEENT: Normal.   ASSESSMENT & PLAN: 1. HF with improved EF: Ischemic cardiomyopathy. Echo 7/25 showed EF 30%, global hypokinesis (prior echo in 2020 with normal EF). 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 Required milrinone  during this admission. cMRI showed LVEF 34%, RVEF 48%, LGE LAD territory with some viability, nonviable myocardium at mid-apical inferoseptum. Findings of MRI suggested ischemic cardiomyopathy.  Echo today showed improved EF 50-55% with mild LVH, mild RV dysfunction/normal RV size, IVC not dilated. NYHA class III mainly due to fatigue, she is not volume overloaded by exam.  GDMT limited by CKD and hyperkalemia.   - Continue Lasix  40 mg daily.  - Stop digoxin  with improved EF.  - Increase Toprol  XL to 25 mg daily.  - No SGLT2i with type I diabetes.  - No ARB/ARNi or spironolactone  with hyperkalemia. - EF now out of ICD range.  2. 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 Has had chronic hyperkalemia.  - BMET today, ?need to resume Lokelma .  - Refer to nephrology. 3. CAD: H/o CABG x 2.  NSTEMI in 7/25.  Cath showed patent LIMA-LAD  and SVG-PDA with unchanged native vessels (occluded RCA and LAD, occluded OMs). No target for intervention. No chest pain. - Continue ASA 81.  - She should restart Plavix  75 mg daily post-MI.  - Continue atorvastatin  80 and Zetia  10. LDL still > 55, will refer to lipid clinic for Repatha.  4. PAD: Followed as outpatient by Dr. Darron. Had severe stenosis right EIA on 4/25 peripheral arterial dopplers.  Has significant right leg claudication. No rest symptoms or pedal ulcerations.  - Avoid cilostazol  with CHF - Continue lipid management.  - On ASA/Plavix .  - Followup with vascular scheudled in 1/26.  5. 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  CTA chest negative for PE or parenchymal lung disease in 7/25.  - Compliant with CPAP 6. DM I: No SGLT2i. . - Followed by endocrine. 7. OSA: Compliant with CPAP 8. Palpitations/extreme fatigue: Has spells of palpitations/fatigue. No syncope/presyncope.  - I will place Zio monitor x 2 wks to assess for arrhythmias.   Followup 3 months with  APP.   I spent 41 minutes reviewing records, interviewing/examining patient, and managing orders.  Harlene HERO McIntosh, FNP 09/03/24 "

## 2024-09-05 ENCOUNTER — Telehealth (HOSPITAL_COMMUNITY): Payer: Self-pay

## 2024-09-05 NOTE — Telephone Encounter (Signed)
 Called to confirm/remind patient of their appointment at the [facility] on 09/08/24.   Appointment:   [x] Confirmed  [] Left mess   [] No answer/No voice mail  [] VM Full/unable to leave message  [] Phone not in service  Patient reminded to bring all medications and/or complete list.  Confirmed patient has transportation. Gave directions, instructed to utilize valet parking.

## 2024-09-08 ENCOUNTER — Ambulatory Visit (HOSPITAL_COMMUNITY)

## 2024-09-08 ENCOUNTER — Ambulatory Visit: Admitting: Pharmacist

## 2024-09-09 ENCOUNTER — Ambulatory Visit (HOSPITAL_COMMUNITY)

## 2024-09-23 ENCOUNTER — Ambulatory Visit: Admitting: Cardiovascular Disease

## 2024-10-14 ENCOUNTER — Ambulatory Visit: Admitting: Nurse Practitioner
